# Patient Record
Sex: Male | Born: 1970
Health system: Southern US, Community
[De-identification: ages and names within clinical notes are randomized; demographics above are authoritative.]

## PROBLEM LIST (undated history)

## (undated) DIAGNOSIS — C787 Secondary malignant neoplasm of liver and intrahepatic bile duct: Secondary | ICD-10-CM

## (undated) DIAGNOSIS — Z9289 Personal history of other medical treatment: Secondary | ICD-10-CM

## (undated) DIAGNOSIS — F329 Major depressive disorder, single episode, unspecified: Secondary | ICD-10-CM

## (undated) DIAGNOSIS — K219 Gastro-esophageal reflux disease without esophagitis: Secondary | ICD-10-CM

## (undated) DIAGNOSIS — S3992XA Unspecified injury of lower back, initial encounter: Secondary | ICD-10-CM

## (undated) DIAGNOSIS — Z923 Personal history of irradiation: Secondary | ICD-10-CM

## (undated) DIAGNOSIS — B029 Zoster without complications: Secondary | ICD-10-CM

## (undated) DIAGNOSIS — J90 Pleural effusion, not elsewhere classified: Secondary | ICD-10-CM

## (undated) DIAGNOSIS — C3490 Malignant neoplasm of unspecified part of unspecified bronchus or lung: Secondary | ICD-10-CM

## (undated) DIAGNOSIS — C7951 Secondary malignant neoplasm of bone: Secondary | ICD-10-CM

## (undated) DIAGNOSIS — N189 Chronic kidney disease, unspecified: Secondary | ICD-10-CM

## (undated) DIAGNOSIS — F32A Depression, unspecified: Secondary | ICD-10-CM

## (undated) DIAGNOSIS — I214 Non-ST elevation (NSTEMI) myocardial infarction: Secondary | ICD-10-CM

## (undated) HISTORY — PX: TONSILLECTOMY: SUR1361

---
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
## 2017-06-13 ENCOUNTER — Inpatient Hospital Stay (HOSPITAL_COMMUNITY)
Admission: EM | Admit: 2017-06-13 | Discharge: 2017-06-23 | DRG: 236 | Disposition: A | Discharge status: Home / Self Care | Payer: BLUE CROSS/BLUE SHIELD | Attending: Cardiothoracic Surgery | Admitting: Cardiothoracic Surgery

## 2017-06-13 ENCOUNTER — Emergency Department (HOSPITAL_COMMUNITY): Payer: BLUE CROSS/BLUE SHIELD

## 2017-06-13 ENCOUNTER — Encounter (HOSPITAL_COMMUNITY): Payer: Self-pay | Admitting: Emergency Medicine

## 2017-06-13 DIAGNOSIS — N183 Chronic kidney disease, stage 3 unspecified: Secondary | ICD-10-CM

## 2017-06-13 DIAGNOSIS — D62 Acute posthemorrhagic anemia: Secondary | ICD-10-CM | POA: Diagnosis not present

## 2017-06-13 DIAGNOSIS — J90 Pleural effusion, not elsewhere classified: Secondary | ICD-10-CM

## 2017-06-13 DIAGNOSIS — R079 Chest pain, unspecified: Secondary | ICD-10-CM | POA: Diagnosis not present

## 2017-06-13 DIAGNOSIS — I25709 Atherosclerosis of coronary artery bypass graft(s), unspecified, with unspecified angina pectoris: Secondary | ICD-10-CM

## 2017-06-13 DIAGNOSIS — J9811 Atelectasis: Secondary | ICD-10-CM | POA: Diagnosis not present

## 2017-06-13 DIAGNOSIS — K224 Dyskinesia of esophagus: Secondary | ICD-10-CM | POA: Diagnosis not present

## 2017-06-13 DIAGNOSIS — D72829 Elevated white blood cell count, unspecified: Secondary | ICD-10-CM | POA: Diagnosis not present

## 2017-06-13 DIAGNOSIS — I129 Hypertensive chronic kidney disease with stage 1 through stage 4 chronic kidney disease, or unspecified chronic kidney disease: Secondary | ICD-10-CM | POA: Diagnosis present

## 2017-06-13 DIAGNOSIS — G8918 Other acute postprocedural pain: Secondary | ICD-10-CM

## 2017-06-13 DIAGNOSIS — F17220 Nicotine dependence, chewing tobacco, uncomplicated: Secondary | ICD-10-CM | POA: Diagnosis present

## 2017-06-13 DIAGNOSIS — R911 Solitary pulmonary nodule: Secondary | ICD-10-CM

## 2017-06-13 DIAGNOSIS — I4510 Unspecified right bundle-branch block: Secondary | ICD-10-CM | POA: Diagnosis present

## 2017-06-13 DIAGNOSIS — I2510 Atherosclerotic heart disease of native coronary artery without angina pectoris: Secondary | ICD-10-CM | POA: Diagnosis present

## 2017-06-13 DIAGNOSIS — K5900 Constipation, unspecified: Secondary | ICD-10-CM | POA: Diagnosis not present

## 2017-06-13 DIAGNOSIS — I25110 Atherosclerotic heart disease of native coronary artery with unstable angina pectoris: Secondary | ICD-10-CM | POA: Diagnosis present

## 2017-06-13 DIAGNOSIS — N179 Acute kidney failure, unspecified: Secondary | ICD-10-CM | POA: Diagnosis not present

## 2017-06-13 DIAGNOSIS — I214 Non-ST elevation (NSTEMI) myocardial infarction: Secondary | ICD-10-CM | POA: Diagnosis not present

## 2017-06-13 DIAGNOSIS — F17290 Nicotine dependence, other tobacco product, uncomplicated: Secondary | ICD-10-CM | POA: Diagnosis present

## 2017-06-13 DIAGNOSIS — K219 Gastro-esophageal reflux disease without esophagitis: Secondary | ICD-10-CM | POA: Diagnosis present

## 2017-06-13 DIAGNOSIS — Z951 Presence of aortocoronary bypass graft: Secondary | ICD-10-CM

## 2017-06-13 DIAGNOSIS — E785 Hyperlipidemia, unspecified: Secondary | ICD-10-CM | POA: Diagnosis present

## 2017-06-13 HISTORY — DX: Unspecified injury of lower back, initial encounter: S39.92XA

## 2017-06-13 HISTORY — DX: Non-ST elevation (NSTEMI) myocardial infarction: I21.4

## 2017-06-13 HISTORY — DX: Gastro-esophageal reflux disease without esophagitis: K21.9

## 2017-06-13 LAB — CBC WITH DIFFERENTIAL/PLATELET
BASOS PCT: 0 %
Basophils Absolute: 0 10*3/uL (ref 0.0–0.1)
EOS ABS: 0.1 10*3/uL (ref 0.0–0.7)
EOS PCT: 1 %
HCT: 42.4 % (ref 39.0–52.0)
Hemoglobin: 15 g/dL (ref 13.0–17.0)
Lymphocytes Relative: 16 %
Lymphs Abs: 2.3 10*3/uL (ref 0.7–4.0)
MCH: 30.8 pg (ref 26.0–34.0)
MCHC: 35.4 g/dL (ref 30.0–36.0)
MCV: 87.1 fL (ref 78.0–100.0)
MONO ABS: 0.7 10*3/uL (ref 0.1–1.0)
MONOS PCT: 5 %
Neutro Abs: 11 10*3/uL — ABNORMAL HIGH (ref 1.7–7.7)
Neutrophils Relative %: 78 %
PLATELETS: 294 10*3/uL (ref 150–400)
RBC: 4.87 MIL/uL (ref 4.22–5.81)
RDW: 13.5 % (ref 11.5–15.5)
WBC: 14.1 10*3/uL — ABNORMAL HIGH (ref 4.0–10.5)

## 2017-06-13 MED ORDER — GI COCKTAIL ~~LOC~~
30.0000 mL | Freq: Once | ORAL | Status: AC
  Administered 2017-06-13: 30 mL via ORAL
  Filled 2017-06-13: qty 30

## 2017-06-13 NOTE — ED Notes (Signed)
Patient transported to X-ray 

## 2017-06-13 NOTE — ED Provider Notes (Signed)
[facility] EMERGENCY DEPARTMENT Provider Note   CSN: [id number] Arrival date & time: 06/13/17  2235     History   Chief Complaint Chief Complaint  Patient presents with  . Chest Pain    HPI Anthony [name] is a 46 y.o. male.  Patient complains of anterior chest pain that started this evening while eating dinner barbecue chicken.  Reports the pain is in the center of his chest and radiates to both of his shoulders.  He tried to relieve this with burping but was unsuccessful.  He made himself vomit which was also unsuccessful and then was called EMS.  He received aspirin and nitroglycerin by EMS and now his pain is resolved.  Patient has had this pain multiple times previously and has attributed to indigestion.  It normally occurs after eating and is relieved with belching or walking around.  The episode today was not going awayand lasting longer than usual.  He denies any shortness of breath or nausea.  He denies any diaphoresis.  He was able to walk his dog today and did not have any exertional chest pain.  Does not have any chest pain when he tries to go upstairs.  Patient feels at baseline now.  No abdominal pain or back pain.  Patient has not seen a doctor for 20 years.  He denies any other medical history.  He takes Nexium for acid reflux.  He does chew tobacco.  No family history of early coronary disease.  He has never had a stress test.   The history is provided by the patient and the EMS personnel.  Chest Pain   Associated symptoms include nausea. Pertinent negatives include no abdominal pain, no dizziness, no fever, no numbness, no vomiting and no weakness.    Past Medical History:  Diagnosis Date  . Acid reflux     There are no active problems to display for this patient.   No past surgical history on file.     Home Medications    Prior to Admission medications   Not on File    Family History No family history on file.  Social History Social  History   Tobacco Use  . Smoking status: Never Smoker  . Smokeless tobacco: Current User    Types: Snuff  Substance Use Topics  . Alcohol use: Yes    Comment: occasionally  . Drug use: No     Allergies   Patient has no known allergies.   Review of Systems Review of Systems  Constitutional: Negative for activity change, fatigue and fever.  HENT: Negative for congestion, nosebleeds and postnasal drip.   Respiratory: Positive for chest tightness.   Cardiovascular: Positive for chest pain.  Gastrointestinal: Positive for nausea. Negative for abdominal pain and vomiting.  Genitourinary: Negative for dysuria, hematuria and testicular pain.  Musculoskeletal: Negative for arthralgias and myalgias.  Skin: Negative for rash.  Neurological: Negative for dizziness, weakness and numbness.   all other systems are negative except as noted in the HPI and PMH.     Physical Exam Updated Vital Signs BP 118/85 (BP Location: Right Arm)   Pulse 92   Temp 99.1 F (37.3 C) (Oral)   Resp 18   Ht _0  (1.778 m)   Wt 99.8 kg (220 lb)   SpO2 100%   BMI 31.57 kg/m   Physical Exam  Constitutional: He is oriented to person, place, and time. He appears well-developed and well-nourished. No distress.  HENT:  Head: Normocephalic and  atraumatic.  Mouth/Throat: Oropharynx is clear and moist. No oropharyngeal exudate.  Eyes: Conjunctivae and EOM are normal. Pupils are equal, round, and reactive to light.  Neck: Normal range of motion. Neck supple.  No meningismus.  Cardiovascular: Normal rate, regular rhythm, normal heart sounds and intact distal pulses.  No murmur heard. Pulmonary/Chest: Effort normal and breath sounds normal. No respiratory distress. He exhibits no tenderness.  Abdominal: Soft. There is no tenderness. There is no rebound and no guarding.  Musculoskeletal: Normal range of motion. He exhibits no edema or tenderness.  Neurological: He is alert and oriented to person, place, and  time. No cranial nerve deficit. He exhibits normal muscle tone. Coordination normal.   5/5 strength throughout. CN 2-12 intact.Equal grip strength.   Skin: Skin is warm.  Psychiatric: He has a normal mood and affect. His behavior is normal.  Nursing note and vitals reviewed.    ED Treatments / Results  Labs (all labs ordered are listed, but only abnormal results are displayed) Labs Reviewed  CBC WITH DIFFERENTIAL/PLATELET - Abnormal; Notable for the following components:      Result Value   WBC 14.1 (*)    Neutro Abs 11.0 (*)    All other components within normal limits  COMPREHENSIVE METABOLIC PANEL - Abnormal; Notable for the following components:   Creatinine, Ser 1.26 (*)    All other components within normal limits  TROPONIN I - Abnormal; Notable for the following components:   Troponin I 0.63 (*)    All other components within normal limits  LIPASE, BLOOD  D-DIMER, QUANTITATIVE (NOT AT [facility])    EKG  EKG Interpretation  Date/Time:  Tuesday June 13 2017 22:43:08 EST Ventricular Rate:  83 PR Interval:  148 QRS Duration: 100 QT Interval:  372 QTC Calculation: 437 R Axis:   -16 Text Interpretation:  Normal sinus rhythm with sinus arrhythmia Incomplete right bundle branch block Possible Anterolateral infarct , age undetermined Abnormal ECG No significant change was found Confirmed by Ezequiel Essex [phone]) on 06/13/2017 10:58:26 PM       Radiology Dg Chest 2 View  Result Date: 06/13/2017 CLINICAL DATA:  46 year old male with chest pain and shortness of breath. EXAM: CHEST  2 VIEW COMPARISON:  None. FINDINGS: The heart size and mediastinal contours are within normal limits. Both lungs are clear. The visualized skeletal structures are unremarkable. IMPRESSION: No active cardiopulmonary disease. Electronically Signed   By: Anner Crete M.D.   On: 06/13/2017 23:52    Procedures Procedures (including critical care time)  Medications Ordered in ED Medications    pantoprazole (PROTONIX) EC tablet 40 mg (not administered)  loratadine (CLARITIN) tablet 10 mg (not administered)  metoprolol tartrate (LOPRESSOR) tablet 25 mg (0 mg Oral Hold 06/14/17 0552)  aspirin chewable tablet 324 mg (324 mg Oral Not Given 06/14/17 0559)    Or  aspirin suppository 300 mg ( Rectal See Alternative 06/14/17 0559)  aspirin EC tablet 81 mg (not administered)  nitroGLYCERIN (NITROSTAT) SL tablet 0.4 mg (not administered)  acetaminophen (TYLENOL) tablet 650 mg (not administered)  ondansetron (ZOFRAN) injection 4 mg (not administered)  heparin ADULT infusion 100 units/mL (25000 units/276m sodium chloride 0.45%) (1,200 Units/hr Intravenous New Bag/Given 06/14/17 0102)  gi cocktail (Maalox,Lidocaine,Donnatal) (30 mLs Oral Given 06/13/17 2318)  aspirin chewable tablet 324 mg (324 mg Oral Given by EMS 06/14/17 0057)  nitroGLYCERIN 50 mg in dextrose 5 % 250 mL (0.2 mg/mL) infusion (15 mcg/min Intravenous Rate/Dose Change 06/14/17 0206)  aspirin chewable tablet 324 mg (  324 mg Oral Given 06/14/17 0558)  heparin bolus via infusion 4,000 Units (4,000 Units Intravenous Bolus from Bag 06/14/17 0102)     Initial Impression / Assessment and Plan / ED Course  I have reviewed the triage vital signs and the nursing notes.  Pertinent labs & imaging results that were available during my care of the patient were reviewed by me and considered in my medical decision making (see chart for details).    Episode of central chest pain similar to previous episodes of indigestion unrelieved by belching and vomiting.  Now pain-free after nitroglycerin and aspirin.  EKG is normal sinus rhythm without comparison.  Patient given aspirin and GI cocktail.  Troponin is positive at 0.63.  Patient continues to complain of chest tightness.  Will start IV heparin and IV nitroglycerin.  D-dimer negative.  Doubt pulmonary embolism.  ESR and CRP obtained to evaluate for possible myocarditis. Doubt aortic  dissection.  Admission discussed with cardiology fellow Dr. Therisa Doyne.  CRITICAL CARE Performed by: Ezequiel Essex Total critical care time: 35 minutes Critical care time was exclusive of separately billable procedures and treating other patients. Critical care was necessary to treat or prevent imminent or life-threatening deterioration. Critical care was time spent personally by me on the following activities: development of treatment plan with patient and/or surrogate as well as nursing, discussions with consultants, evaluation of patient's response to treatment, examination of patient, obtaining history from patient or surrogate, ordering and performing treatments and interventions, ordering and review of laboratory studies, ordering and review of radiographic studies, pulse oximetry and re-evaluation of patient's condition.  Final Clinical Impressions(s) / ED Diagnoses   Final diagnoses:  NSTEMI (non-ST elevated myocardial infarction) [facility])    ED Discharge Orders    None       Ezequiel Essex, MD 06/14/17 [phone]

## 2017-06-13 NOTE — ED Triage Notes (Signed)
Per [facility], Pt from home. Pt complaining of CP starting 2 p.m. Today. Pt reports he tried to burp, walk and make himself vomit to feel better with no relief. Pt's initial BP with EMS 198/110. Pt received 324 ASA and 3 nitro en route. Pt's initial pain score 6, after medication is 0.5. Pt denies diaphoresis, nausea/vomiting. Pt alert and oriented, in NAD.

## 2017-06-14 ENCOUNTER — Encounter (HOSPITAL_COMMUNITY): Payer: Self-pay | Admitting: General Practice

## 2017-06-14 ENCOUNTER — Inpatient Hospital Stay (HOSPITAL_COMMUNITY): Payer: BLUE CROSS/BLUE SHIELD

## 2017-06-14 ENCOUNTER — Other Ambulatory Visit: Payer: Self-pay

## 2017-06-14 ENCOUNTER — Encounter (HOSPITAL_COMMUNITY)
Admission: EM | Disposition: A | Discharge status: Home / Self Care | Payer: Self-pay | Source: Home / Self Care | Attending: Interventional Cardiology

## 2017-06-14 DIAGNOSIS — N184 Chronic kidney disease, stage 4 (severe): Secondary | ICD-10-CM | POA: Diagnosis not present

## 2017-06-14 DIAGNOSIS — N183 Chronic kidney disease, stage 3 (moderate): Secondary | ICD-10-CM | POA: Diagnosis present

## 2017-06-14 DIAGNOSIS — R079 Chest pain, unspecified: Secondary | ICD-10-CM | POA: Diagnosis present

## 2017-06-14 DIAGNOSIS — K224 Dyskinesia of esophagus: Secondary | ICD-10-CM | POA: Diagnosis not present

## 2017-06-14 DIAGNOSIS — J9811 Atelectasis: Secondary | ICD-10-CM | POA: Diagnosis not present

## 2017-06-14 DIAGNOSIS — I214 Non-ST elevation (NSTEMI) myocardial infarction: Secondary | ICD-10-CM | POA: Diagnosis present

## 2017-06-14 DIAGNOSIS — E785 Hyperlipidemia, unspecified: Secondary | ICD-10-CM | POA: Diagnosis present

## 2017-06-14 DIAGNOSIS — K5900 Constipation, unspecified: Secondary | ICD-10-CM | POA: Diagnosis not present

## 2017-06-14 DIAGNOSIS — N179 Acute kidney failure, unspecified: Secondary | ICD-10-CM | POA: Diagnosis not present

## 2017-06-14 DIAGNOSIS — I25110 Atherosclerotic heart disease of native coronary artery with unstable angina pectoris: Secondary | ICD-10-CM | POA: Diagnosis present

## 2017-06-14 DIAGNOSIS — D62 Acute posthemorrhagic anemia: Secondary | ICD-10-CM | POA: Diagnosis not present

## 2017-06-14 DIAGNOSIS — Z01810 Encounter for preprocedural cardiovascular examination: Secondary | ICD-10-CM | POA: Diagnosis not present

## 2017-06-14 DIAGNOSIS — F17290 Nicotine dependence, other tobacco product, uncomplicated: Secondary | ICD-10-CM | POA: Diagnosis present

## 2017-06-14 DIAGNOSIS — R0789 Other chest pain: Secondary | ICD-10-CM | POA: Diagnosis not present

## 2017-06-14 DIAGNOSIS — K219 Gastro-esophageal reflux disease without esophagitis: Secondary | ICD-10-CM | POA: Diagnosis present

## 2017-06-14 DIAGNOSIS — F17220 Nicotine dependence, chewing tobacco, uncomplicated: Secondary | ICD-10-CM | POA: Diagnosis present

## 2017-06-14 DIAGNOSIS — I2510 Atherosclerotic heart disease of native coronary artery without angina pectoris: Secondary | ICD-10-CM | POA: Diagnosis not present

## 2017-06-14 DIAGNOSIS — I4510 Unspecified right bundle-branch block: Secondary | ICD-10-CM | POA: Diagnosis present

## 2017-06-14 DIAGNOSIS — I129 Hypertensive chronic kidney disease with stage 1 through stage 4 chronic kidney disease, or unspecified chronic kidney disease: Secondary | ICD-10-CM | POA: Diagnosis present

## 2017-06-14 DIAGNOSIS — D72829 Elevated white blood cell count, unspecified: Secondary | ICD-10-CM | POA: Diagnosis not present

## 2017-06-14 DIAGNOSIS — I361 Nonrheumatic tricuspid (valve) insufficiency: Secondary | ICD-10-CM | POA: Diagnosis not present

## 2017-06-14 HISTORY — PX: LEFT HEART CATH AND CORONARY ANGIOGRAPHY: CATH118249

## 2017-06-14 HISTORY — PX: CARDIAC CATHETERIZATION: SHX172

## 2017-06-14 LAB — COMPREHENSIVE METABOLIC PANEL
ALT: 33 U/L (ref 17–63)
ANION GAP: 8 (ref 5–15)
AST: 23 U/L (ref 15–41)
Albumin: 3.9 g/dL (ref 3.5–5.0)
Alkaline Phosphatase: 46 U/L (ref 38–126)
BUN: 11 mg/dL (ref 6–20)
CALCIUM: 9.2 mg/dL (ref 8.9–10.3)
CHLORIDE: 103 mmol/L (ref 101–111)
CO2: 27 mmol/L (ref 22–32)
CREATININE: 1.26 mg/dL — AB (ref 0.61–1.24)
Glucose, Bld: 91 mg/dL (ref 65–99)
POTASSIUM: 3.8 mmol/L (ref 3.5–5.1)
SODIUM: 138 mmol/L (ref 135–145)
TOTAL PROTEIN: 6.8 g/dL (ref 6.5–8.1)
Total Bilirubin: 0.6 mg/dL (ref 0.3–1.2)

## 2017-06-14 LAB — SEDIMENTATION RATE: Sed Rate: 13 mm/hr (ref 0–16)

## 2017-06-14 LAB — C-REACTIVE PROTEIN: CRP: 1 mg/dL — AB (ref <1.0)

## 2017-06-14 LAB — TROPONIN I
TROPONIN I: 1.59 ng/mL — AB (ref <0.03)
Troponin I: 0.63 ng/mL (ref <0.03)

## 2017-06-14 LAB — D-DIMER, QUANTITATIVE: D-Dimer, Quant: <0.27 ug/mL-FEU (ref 0.00–0.50)

## 2017-06-14 LAB — LIPID PANEL
CHOL/HDL RATIO: 7.2 ratio
CHOLESTEROL: 187 mg/dL (ref 0–200)
HDL: 26 mg/dL — AB (ref >40)
LDL Cholesterol: 129 mg/dL — ABNORMAL HIGH (ref 0–99)
TRIGLYCERIDES: 160 mg/dL — AB (ref <150)
VLDL: 32 mg/dL (ref 0–40)

## 2017-06-14 LAB — HEPARIN LEVEL (UNFRACTIONATED): Heparin Unfractionated: 0.23 IU/mL — ABNORMAL LOW (ref 0.30–0.70)

## 2017-06-14 LAB — LIPASE, BLOOD: LIPASE: 26 U/L (ref 11–51)

## 2017-06-14 LAB — HIV ANTIBODY (ROUTINE TESTING W REFLEX): HIV SCREEN 4TH GENERATION: NONREACTIVE

## 2017-06-14 LAB — PROTIME-INR
INR: 1.01
Prothrombin Time: 13.2 seconds (ref 11.4–15.2)

## 2017-06-14 LAB — HEMOGLOBIN A1C
HEMOGLOBIN A1C: 5.2 % (ref 4.8–5.6)
Mean Plasma Glucose: 102.54 mg/dL

## 2017-06-14 SURGERY — LEFT HEART CATH AND CORONARY ANGIOGRAPHY
Anesthesia: LOCAL

## 2017-06-14 MED ORDER — IOPAMIDOL (ISOVUE-370) INJECTION 76%
INTRAVENOUS | Status: AC
  Filled 2017-06-14: qty 125

## 2017-06-14 MED ORDER — SODIUM CHLORIDE 0.9 % WEIGHT BASED INFUSION: 1.0000 mL/kg/h | INTRAVENOUS | Status: DC

## 2017-06-14 MED ORDER — IOPAMIDOL (ISOVUE-370) INJECTION 76%
INTRAVENOUS | Status: DC | PRN
  Administered 2017-06-14: 95 mL via INTRA_ARTERIAL

## 2017-06-14 MED ORDER — ASPIRIN 81 MG PO CHEW: 324.0000 mg | CHEWABLE_TABLET | Freq: Once | ORAL | Status: AC

## 2017-06-14 MED ORDER — ONDANSETRON HCL 4 MG/2ML IJ SOLN: 4.0000 mg | Freq: Four times a day (QID) | INTRAMUSCULAR | Status: DC | PRN

## 2017-06-14 MED ORDER — SODIUM CHLORIDE 0.9 % IV SOLN: 250.0000 mL | INTRAVENOUS | Status: DC | PRN

## 2017-06-14 MED ORDER — FENTANYL CITRATE (PF) 100 MCG/2ML IJ SOLN
INTRAMUSCULAR | Status: AC
  Filled 2017-06-14: qty 2

## 2017-06-14 MED ORDER — LIDOCAINE HCL (PF) 1 % IJ SOLN
INTRAMUSCULAR | Status: AC
  Filled 2017-06-14: qty 30

## 2017-06-14 MED ORDER — PANTOPRAZOLE SODIUM 40 MG PO TBEC
40.0000 mg | DELAYED_RELEASE_TABLET | Freq: Every day | ORAL | Status: DC
  Administered 2017-06-14 – 2017-06-18 (×5): 40 mg via ORAL
  Filled 2017-06-14 (×6): qty 1

## 2017-06-14 MED ORDER — MIDAZOLAM HCL 2 MG/2ML IJ SOLN
INTRAMUSCULAR | Status: AC
  Filled 2017-06-14: qty 2

## 2017-06-14 MED ORDER — FENTANYL CITRATE (PF) 100 MCG/2ML IJ SOLN
INTRAMUSCULAR | Status: DC | PRN
  Administered 2017-06-14: 25 ug via INTRAVENOUS

## 2017-06-14 MED ORDER — HEPARIN (PORCINE) IN NACL 100-0.45 UNIT/ML-% IJ SOLN
1400.0000 [IU]/h | INTRAMUSCULAR | Status: DC
  Administered 2017-06-14: 1200 [IU]/h via INTRAVENOUS
  Filled 2017-06-14: qty 250

## 2017-06-14 MED ORDER — DIAZEPAM 5 MG PO TABS: 5.0000 mg | ORAL_TABLET | Freq: Four times a day (QID) | ORAL | Status: DC | PRN

## 2017-06-14 MED ORDER — NITROGLYCERIN IN D5W 200-5 MCG/ML-% IV SOLN
0.0000 ug/min | INTRAVENOUS | Status: DC
  Administered 2017-06-16: 15 ug/min via INTRAVENOUS
  Filled 2017-06-14 (×3): qty 250

## 2017-06-14 MED ORDER — NITROGLYCERIN 0.4 MG SL SUBL: 0.4000 mg | SUBLINGUAL_TABLET | SUBLINGUAL | Status: DC | PRN

## 2017-06-14 MED ORDER — ASPIRIN 81 MG PO CHEW: 324.0000 mg | CHEWABLE_TABLET | ORAL | Status: DC

## 2017-06-14 MED ORDER — SODIUM CHLORIDE 0.9 % IV SOLN
250.0000 mL | INTRAVENOUS | Status: DC | PRN
  Administered 2017-06-15 – 2017-06-17 (×3): 250 mL via INTRAVENOUS
  Administered 2017-06-19 (×3): via INTRAVENOUS

## 2017-06-14 MED ORDER — ATORVASTATIN CALCIUM 80 MG PO TABS
80.0000 mg | ORAL_TABLET | Freq: Every day | ORAL | Status: DC
  Administered 2017-06-14 – 2017-06-22 (×9): 80 mg via ORAL
  Filled 2017-06-14 (×8): qty 1

## 2017-06-14 MED ORDER — ASPIRIN EC 81 MG PO TBEC
81.0000 mg | DELAYED_RELEASE_TABLET | Freq: Every day | ORAL | Status: DC
  Administered 2017-06-15 – 2017-06-18 (×4): 81 mg via ORAL
  Filled 2017-06-14 (×4): qty 1

## 2017-06-14 MED ORDER — HEPARIN (PORCINE) IN NACL 2-0.9 UNIT/ML-% IJ SOLN
INTRAMUSCULAR | Status: AC
  Filled 2017-06-14: qty 1000

## 2017-06-14 MED ORDER — SODIUM CHLORIDE 0.9 % WEIGHT BASED INFUSION: 3.0000 mL/kg/h | INTRAVENOUS | Status: DC

## 2017-06-14 MED ORDER — METOPROLOL TARTRATE 25 MG PO TABS: 25.0000 mg | ORAL_TABLET | Freq: Once | ORAL | Status: DC

## 2017-06-14 MED ORDER — ACETAMINOPHEN 325 MG PO TABS
650.0000 mg | ORAL_TABLET | ORAL | Status: DC | PRN
  Administered 2017-06-14 – 2017-06-18 (×10): 650 mg via ORAL
  Filled 2017-06-14 (×10): qty 2

## 2017-06-14 MED ORDER — OXYCODONE HCL 5 MG PO TABS: 5.0000 mg | ORAL_TABLET | ORAL | Status: DC | PRN

## 2017-06-14 MED ORDER — HEPARIN (PORCINE) IN NACL 2-0.9 UNIT/ML-% IJ SOLN
INTRAMUSCULAR | Status: AC | PRN
  Administered 2017-06-14: 1000 mL

## 2017-06-14 MED ORDER — MIDAZOLAM HCL 2 MG/2ML IJ SOLN
INTRAMUSCULAR | Status: DC | PRN
  Administered 2017-06-14: 2 mg via INTRAVENOUS

## 2017-06-14 MED ORDER — NITROGLYCERIN IN D5W 200-5 MCG/ML-% IV SOLN
0.0000 ug/min | Freq: Once | INTRAVENOUS | Status: AC
  Administered 2017-06-14: 15 ug/min via INTRAVENOUS

## 2017-06-14 MED ORDER — NITROGLYCERIN IN D5W 200-5 MCG/ML-% IV SOLN
0.0000 ug/min | Freq: Once | INTRAVENOUS | Status: AC
  Administered 2017-06-14: 5 ug/min via INTRAVENOUS
  Filled 2017-06-14: qty 250

## 2017-06-14 MED ORDER — SODIUM CHLORIDE 0.9% FLUSH: 3.0000 mL | INTRAVENOUS | Status: DC | PRN

## 2017-06-14 MED ORDER — HEPARIN BOLUS VIA INFUSION
4000.0000 [IU] | Freq: Once | INTRAVENOUS | Status: AC
  Administered 2017-06-14: 4000 [IU] via INTRAVENOUS
  Filled 2017-06-14: qty 4000

## 2017-06-14 MED ORDER — VERAPAMIL HCL 2.5 MG/ML IV SOLN
INTRAVENOUS | Status: AC
  Filled 2017-06-14: qty 2

## 2017-06-14 MED ORDER — LORATADINE 10 MG PO TABS: 10.0000 mg | ORAL_TABLET | Freq: Every day | ORAL | Status: DC | PRN

## 2017-06-14 MED ORDER — ASPIRIN 81 MG PO CHEW
81.0000 mg | CHEWABLE_TABLET | ORAL | Status: AC
  Administered 2017-06-14: 81 mg via ORAL
  Filled 2017-06-14: qty 1

## 2017-06-14 MED ORDER — MORPHINE SULFATE (PF) 4 MG/ML IV SOLN: 2.0000 mg | INTRAVENOUS | Status: DC | PRN

## 2017-06-14 MED ORDER — SODIUM CHLORIDE 0.9% FLUSH: 3.0000 mL | Freq: Two times a day (BID) | INTRAVENOUS | Status: DC

## 2017-06-14 MED ORDER — ASPIRIN 81 MG PO CHEW
324.0000 mg | CHEWABLE_TABLET | Freq: Once | ORAL | Status: AC
  Administered 2017-06-14: 324 mg via ORAL
  Filled 2017-06-14: qty 4

## 2017-06-14 MED ORDER — VERAPAMIL HCL 2.5 MG/ML IV SOLN
INTRAVENOUS | Status: DC | PRN
  Administered 2017-06-14: 10 mL via INTRA_ARTERIAL

## 2017-06-14 MED ORDER — SODIUM CHLORIDE 0.9 % WEIGHT BASED INFUSION: 1.0000 mL/kg/h | INTRAVENOUS | Status: AC

## 2017-06-14 MED ORDER — HEPARIN (PORCINE) IN NACL 100-0.45 UNIT/ML-% IJ SOLN
1450.0000 [IU]/h | INTRAMUSCULAR | Status: DC
  Administered 2017-06-14: 1400 [IU]/h via INTRAVENOUS
  Administered 2017-06-15 – 2017-06-16 (×3): 1550 [IU]/h via INTRAVENOUS
  Administered 2017-06-18 – 2017-06-19 (×2): 1450 [IU]/h via INTRAVENOUS
  Filled 2017-06-14 (×7): qty 250

## 2017-06-14 MED ORDER — LIDOCAINE HCL (PF) 1 % IJ SOLN
INTRAMUSCULAR | Status: DC | PRN
  Administered 2017-06-14: 3 mL via INTRADERMAL

## 2017-06-14 MED ORDER — HEPARIN (PORCINE) IN NACL 100-0.45 UNIT/ML-% IJ SOLN: 1400.0000 [IU]/h | INTRAMUSCULAR | Status: DC

## 2017-06-14 MED ORDER — SODIUM CHLORIDE 0.9% FLUSH
3.0000 mL | Freq: Two times a day (BID) | INTRAVENOUS | Status: DC
  Administered 2017-06-15 – 2017-06-18 (×8): 3 mL via INTRAVENOUS

## 2017-06-14 MED ORDER — HEPARIN SODIUM (PORCINE) 1000 UNIT/ML IJ SOLN
INTRAMUSCULAR | Status: AC
  Filled 2017-06-14: qty 1

## 2017-06-14 MED ORDER — HEPARIN SODIUM (PORCINE) 1000 UNIT/ML IJ SOLN
INTRAMUSCULAR | Status: DC | PRN
  Administered 2017-06-14: 5000 [IU] via INTRAVENOUS

## 2017-06-14 MED ORDER — ASPIRIN 300 MG RE SUPP: 300.0000 mg | RECTAL | Status: DC

## 2017-06-14 SURGICAL SUPPLY — 12 items
CATH INFINITI 5FR ANG PIGTAIL (CATHETERS) ×2 IMPLANT
CATH INFINITI JR4 5F (CATHETERS) ×2 IMPLANT
CATH LAUNCHER 6FR EBU3.5 (CATHETERS) ×2 IMPLANT
DEVICE RAD COMP TR BAND LRG (VASCULAR PRODUCTS) ×2 IMPLANT
GLIDESHEATH SLEND SS 6F .021 (SHEATH) ×2 IMPLANT
GUIDEWIRE INQWIRE 1.5J.035X260 (WIRE) ×1 IMPLANT
INQWIRE 1.5J .035X260CM (WIRE) ×2
KIT HEART LEFT (KITS) ×2 IMPLANT
PACK CARDIAC CATHETERIZATION (CUSTOM PROCEDURE TRAY) ×2 IMPLANT
SYR MEDRAD MARK V 150ML (SYRINGE) ×2 IMPLANT
TRANSDUCER W/STOPCOCK (MISCELLANEOUS) ×2 IMPLANT
TUBING CIL FLEX 10 FLL-RA (TUBING) ×2 IMPLANT

## 2017-06-14 NOTE — Progress Notes (Signed)
ANTICOAGULATION CONSULT NOTE  Pharmacy Consult for Heparin Indication: chest pain/ACS  No Known Allergies  Patient Measurements: Height: 5\' 10"  (177.8 cm) Weight: 220 lb (99.8 kg) IBW/kg (Calculated) : 73 Heparin Dosing Weight: 94 kg  Vital Signs: Temp: 99.1 F (37.3 C) (11/13 2236) Temp Source: Oral (11/13 2236) BP: 132/87 (11/14 0700) Pulse Rate: 66 (11/14 0700)  Labs: Recent Labs    06/13/17 2313 06/14/17 0754  HGB 15.0  --   HCT 42.4  --   PLT 294  --   HEPARINUNFRC  --  0.23*  CREATININE 1.26*  --   TROPONINI 0.63*  --     Estimated Creatinine Clearance: 86.7 mL/min (A) (by C-G formula based on SCr of 1.26 mg/dL (H)).   Medical History: Past Medical History:  Diagnosis Date  . Acid reflux     Medications:  No current facility-administered medications on file prior to encounter.    Current Outpatient Medications on File Prior to Encounter  Medication Sig Dispense Refill  . esomeprazole (NEXIUM) 20 MG capsule Take 20 mg daily at 12 noon by mouth.    . loratadine (CLARITIN) 10 MG tablet Take 10 mg daily as needed by mouth for allergies.       Assessment: 46 y.o. male with chest pain continuing on heparin for ACS. Initial heparin level low at 0.23. CBC wnl. No bleed or IV line issues per RN. Not on anticoagulation PTA. D-dimer negative, trop positive.  Goal of Therapy:  Heparin level 0.3-0.7 units/ml Monitor platelets by anticoagulation protocol: Yes   Plan:  Increase heparin to 1400 units/hr Check heparin level in 6 hours Daily heparin level/CBC Monitor for s/sx bleeding F/u Cardiology plans  Elicia Lamp, PharmD, BCPS Clinical Pharmacist 06/14/2017 8:46 AM

## 2017-06-14 NOTE — Interval H&P Note (Signed)
Cath Lab Visit (complete for each Cath Lab visit)  Clinical Evaluation Leading to the Procedure:   ACS: Yes.    Non-ACS:    Anginal Classification: CCS IV  Anti-ischemic medical therapy: No Therapy  Non-Invasive Test Results: No non-invasive testing performed  Prior CABG: No previous CABG  History and Physical Interval Note:  06/14/2017 4:11 PM  Anthony [name]  has presented today for surgery, with the diagnosis of cp  The various methods of treatment have been discussed with the patient and family. After consideration of risks, benefits and other options for treatment, the patient has consented to  Procedure(s): LEFT HEART CATH AND CORONARY ANGIOGRAPHY (N/A) as a surgical intervention .  The patient's history has been reviewed, patient examined, no change in status, stable for surgery.  I have reviewed the patient's chart and labs.  Questions were answered to the patient's satisfaction.     Sherren Mocha

## 2017-06-14 NOTE — Progress Notes (Signed)
The patient has been seen in conjunction with Reino Bellis, NP-C. All aspects of care have been considered and discussed. The patient has been personally interviewed, examined, and all clinical data has been reviewed.   Has ACS and needs cath. The patient was counseled to undergo left heart catheterization, coronary angiography, and possible percutaneous coronary intervention with stent implantation. The procedural risks and benefits were discussed in detail. The risks discussed included death, stroke, myocardial infarction, life-threatening bleeding, limb ischemia, kidney injury, allergy, and possible emergency cardiac surgery. The risk of these significant complications were estimated to occur less than 1% of the time. After discussion, the patient has agreed to proceed.  Progress Note  Patient Name: Anthony [name] Date of Encounter: 06/14/2017  Primary Cardiologist: New  Subjective   No current chest pain.   Inpatient Medications    Scheduled Meds: . [START ON 06/15/2017] aspirin EC  81 mg Oral Daily  . metoprolol tartrate  25 mg Oral Once  . pantoprazole  40 mg Oral Daily   Continuous Infusions: . heparin 1,400 Units/hr (06/14/17 0859)   PRN Meds: acetaminophen, loratadine, nitroGLYCERIN, ondansetron (ZOFRAN) IV   Vital Signs    Vitals:   06/14/17 0615 06/14/17 0630 06/14/17 0645 06/14/17 0700  BP: (!) 147/87 137/77 135/85 132/87  Pulse: 64 69 60 66  Resp: 12 11 11 11   Temp:      TempSrc:      SpO2: 97% 95% 97% 98%  Weight:      Height:       No intake or output data in the 24 hours ending 06/14/17 1024 Filed Weights   06/13/17 2236  Weight: 220 lb (99.8 kg)    Telemetry    SR - Personally Reviewed  ECG    SR - Personally Reviewed  Physical Exam   General: Well developed, well nourished, male appearing in no acute distress. Head: Normocephalic, atraumatic.  Neck: Supple without bruits, JVD. Lungs:  Resp regular and unlabored, CTA. Heart: RRR, S1,  S2, no S3, S4, or murmur; no rub. Abdomen: Soft, non-tender, non-distended with normoactive bowel sounds. No hepatomegaly. No rebound/guarding. No obvious abdominal masses. Extremities: No clubbing, cyanosis, edema. Distal pedal pulses are 2+ bilaterally. Neuro: Alert and oriented X 3. Moves all extremities spontaneously. Psych: Normal affect.  Labs    Chemistry Recent Labs  Lab 06/13/17 2313  NA 138  K 3.8  CL 103  CO2 27  GLUCOSE 91  BUN 11  CREATININE 1.26*  CALCIUM 9.2  PROT 6.8  ALBUMIN 3.9  AST 23  ALT 33  ALKPHOS 46  BILITOT 0.6  GFRNONAA >60  GFRAA >60  ANIONGAP 8     Hematology Recent Labs  Lab 06/13/17 2313  WBC 14.1*  RBC 4.87  HGB 15.0  HCT 42.4  MCV 87.1  MCH 30.8  MCHC 35.4  RDW 13.5  PLT 294    Cardiac Enzymes Recent Labs  Lab 06/13/17 2313 06/14/17 0754  TROPONINI 0.63* 1.59*   No results for input(s): TROPIPOC in the last 168 hours.   BNPNo results for input(s): BNP, PROBNP in the last 168 hours.   DDimer  Recent Labs  Lab 06/14/17 0037  DDIMER <0.27      Radiology    Dg Chest 2 View  Result Date: 06/13/2017 CLINICAL DATA:  46 year old male with chest pain and shortness of breath. EXAM: CHEST  2 VIEW COMPARISON:  None. FINDINGS: The heart size and mediastinal contours are within normal limits. Both lungs are  clear. The visualized skeletal structures are unremarkable. IMPRESSION: No active cardiopulmonary disease. Electronically Signed   By: Anner Crete M.D.   On: 06/13/2017 23:52    Cardiac Studies   N/a   Patient Profile     46 y.o. male with no PMH who presented with chest pressure over the past week. Found to have positive troponin.   Assessment & Plan    1. NSTEMI: Trop trending up 0.6>>1.59. He reports symptoms first developed about a week ago after eating spicy food. Lingered for 2 days, then improved. Also reports intermittent episodes of chest tightness while walking the dog that improve with rest.  Yesterday ate lunch around 2pm and developed chest pressure with mild burning, that persisted into the evening. Vomited that evening, and then chest pressure significantly worsened.  -- now pain free on nitro gtt, and heparin gtt. Clinical picture is concerning for ACS. -- plan for cardiac cath today -- The patient understands that risks included but are not limited to stroke (1 in 1000), death (1 in 1000), kidney failure [usually temporary] (1 in 500), bleeding (1 in 200), allergic reaction [possibly serious] (1 in 200).  -- check lipid panel and Hgb A1c  2. AKI?: Cr 1.26, will give prehydration IVFs now.  -- BMET in the am  Signed, Reino Bellis, NP  06/14/2017, 10:24 AM  Pager # [phone]   For questions or updates, please contact [facility] Please consult [email or web address] for contact info under Cardiology/STEMI.

## 2017-06-14 NOTE — H&P (Signed)
Anthony [name] is an 46 y.o. male.      DOB 1970-09-10  Chief Complaint: Chest  Pain , No  prior  Cardiology  Issues HPI:  56  Y  OLD  WITH Chest pain ,   Patient complains of anterior chest pain that started this evening while eating dinner barbecue chicken.  Reports the pain is in the center of his chest and radiates to both of his shoulders.  He tried to relieve this with burping but was unsuccessful.  He made himself vomit which was also unsuccessful and then was called EMS.  He received aspirin and nitroglycerin by EMS and now his pain is resolved.  Patient has had this pain multiple times previously and has attributed to indigestion.  It normally occurs after eating and is relieved with belching or walking around.  The episode today was not going awayand lasting longer than usual.  He denies any shortness of breath or nausea.  He denies any diaphoresis.  He was able to walk his dog today and did not have any exertional chest pain.  Does not have any chest pain when he tries to go upstairs.  Patient feels at baseline now.  No abdominal pain or back pain.  Patient has not seen a doctor for 20 years.  He denies any other medical history.  He takes Nexium for acid reflux.  He does chew tobacco.  No family history of early coronary disease.  He has never had a stress test.   GERD  Type, trop - positive 0.5 D/d  Myopericarditis Hep gtt , nitro gtt on board Trop  Being  Trended.  Past Medical History:  Diagnosis Date  . Acid reflux     No past surgical history on file.  No family history on file. Social History:  reports that  has never smoked. His smokeless tobacco use includes snuff. He reports that he drinks alcohol. He reports that he does not use drugs.  Allergies: No Known Allergies   (Not in a hospital admission)  Results for orders placed or performed during the hospital encounter of 06/13/17 (from the past 48 hour(s))  CBC with Differential/Platelet     Status: Abnormal   Collection Time: 06/13/17 11:13 PM  Result Value Ref Range   WBC 14.1 (H) 4.0 - 10.5 K/uL   RBC 4.87 4.22 - 5.81 MIL/uL   Hemoglobin 15.0 13.0 - 17.0 g/dL   HCT 42.4 39.0 - 52.0 %   MCV 87.1 78.0 - 100.0 fL   MCH 30.8 26.0 - 34.0 pg   MCHC 35.4 30.0 - 36.0 g/dL   RDW 13.5 11.5 - 15.5 %   Platelets 294 150 - 400 K/uL   Neutrophils Relative % 78 %   Neutro Abs 11.0 (H) 1.7 - 7.7 K/uL   Lymphocytes Relative 16 %   Lymphs Abs 2.3 0.7 - 4.0 K/uL   Monocytes Relative 5 %   Monocytes Absolute 0.7 0.1 - 1.0 K/uL   Eosinophils Relative 1 %   Eosinophils Absolute 0.1 0.0 - 0.7 K/uL   Basophils Relative 0 %   Basophils Absolute 0.0 0.0 - 0.1 K/uL  Comprehensive metabolic panel     Status: Abnormal   Collection Time: 06/13/17 11:13 PM  Result Value Ref Range   Sodium 138 135 - 145 mmol/L   Potassium 3.8 3.5 - 5.1 mmol/L   Chloride 103 101 - 111 mmol/L   CO2 27 22 - 32 mmol/L   Glucose, Bld 91 65 - 99  mg/dL   BUN 11 6 - 20 mg/dL   Creatinine, Ser 1.26 (H) 0.61 - 1.24 mg/dL   Calcium 9.2 8.9 - 10.3 mg/dL   Total Protein 6.8 6.5 - 8.1 g/dL   Albumin 3.9 3.5 - 5.0 g/dL   AST 23 15 - 41 U/L   ALT 33 17 - 63 U/L   Alkaline Phosphatase 46 38 - 126 U/L   Total Bilirubin 0.6 0.3 - 1.2 mg/dL   GFR calc non Af Amer >60 >60 mL/min   GFR calc Af Amer >60 >60 mL/min    Comment: (NOTE) The eGFR has been calculated using the CKD EPI equation. This calculation has not been validated in all clinical situations. eGFR's persistently <60 mL/min signify possible Chronic Kidney Disease.    Anion gap 8 5 - 15  Lipase, blood     Status: None   Collection Time: 06/13/17 11:13 PM  Result Value Ref Range   Lipase 26 11 - 51 U/L  Troponin I     Status: Abnormal   Collection Time: 06/13/17 11:13 PM  Result Value Ref Range   Troponin I 0.63 (HH) <0.03 ng/mL    Comment: CRITICAL RESULT CALLED TO, READ BACK BY AND VERIFIED WITH: WOODRUFF C,RN 06/14/17 0019 WAYK    Dg Chest 2 View  Result Date:  06/13/2017 CLINICAL DATA:  46 year old male with chest pain and shortness of breath. EXAM: CHEST  2 VIEW COMPARISON:  None. FINDINGS: The heart size and mediastinal contours are within normal limits. Both lungs are clear. The visualized skeletal structures are unremarkable. IMPRESSION: No active cardiopulmonary disease. Electronically Signed   By: Anner Crete M.D.   On: 06/13/2017 23:52    Review of Systems  Constitutional: Negative.   HENT: Negative.   Eyes: Negative.   Respiratory: Negative.   Cardiovascular: Positive for chest pain.  Gastrointestinal: Negative.   Musculoskeletal: Negative.   Neurological: Negative.   Psychiatric/Behavioral: Negative.     Blood pressure (!) 146/95, pulse 83, temperature 99.1 F (37.3 C), temperature source Oral, resp. rate 13, height '5\' 10"'$  (1.778 m), weight 99.8 kg (220 lb), SpO2 96 %. Physical Exam  Constitutional: He appears well-nourished.  Eyes: Pupils are equal, round, and reactive to light.  Cardiovascular: Normal rate.  Murmur heard. Respiratory: Effort normal.  GI: Soft.  Neurological: He is alert.     Assessment/Plan Precordial chest  Pain  GERD Elevated  Cardiac  Enzymes  D/D Myopericarditis NSTEMI  Await ESR ECHO, trop  Trend   [name], MD 06/14/2017, 12:37 AM

## 2017-06-14 NOTE — H&P (View-Only) (Signed)
The patient has been seen in conjunction with Reino Bellis, NP-C. All aspects of care have been considered and discussed. The patient has been personally interviewed, examined, and all clinical data has been reviewed.   Has ACS and needs cath. The patient was counseled to undergo left heart catheterization, coronary angiography, and possible percutaneous coronary intervention with stent implantation. The procedural risks and benefits were discussed in detail. The risks discussed included death, stroke, myocardial infarction, life-threatening bleeding, limb ischemia, kidney injury, allergy, and possible emergency cardiac surgery. The risk of these significant complications were estimated to occur less than 1% of the time. After discussion, the patient has agreed to proceed.  Progress Note  Patient Name: Anthony [name] Date of Encounter: 06/14/2017  Primary Cardiologist: New  Subjective   No current chest pain.   Inpatient Medications    Scheduled Meds: . [START ON 06/15/2017] aspirin EC  81 mg Oral Daily  . metoprolol tartrate  25 mg Oral Once  . pantoprazole  40 mg Oral Daily   Continuous Infusions: . heparin 1,400 Units/hr (06/14/17 0859)   PRN Meds: acetaminophen, loratadine, nitroGLYCERIN, ondansetron (ZOFRAN) IV   Vital Signs    Vitals:   06/14/17 0615 06/14/17 0630 06/14/17 0645 06/14/17 0700  BP: (!) 147/87 137/77 135/85 132/87  Pulse: 64 69 60 66  Resp: 12 11 11 11   Temp:      TempSrc:      SpO2: 97% 95% 97% 98%  Weight:      Height:       No intake or output data in the 24 hours ending 06/14/17 1024 Filed Weights   06/13/17 2236  Weight: 220 lb (99.8 kg)    Telemetry    SR - Personally Reviewed  ECG    SR - Personally Reviewed  Physical Exam   General: Well developed, well nourished, male appearing in no acute distress. Head: Normocephalic, atraumatic.  Neck: Supple without bruits, JVD. Lungs:  Resp regular and unlabored, CTA. Heart: RRR, S1,  S2, no S3, S4, or murmur; no rub. Abdomen: Soft, non-tender, non-distended with normoactive bowel sounds. No hepatomegaly. No rebound/guarding. No obvious abdominal masses. Extremities: No clubbing, cyanosis, edema. Distal pedal pulses are 2+ bilaterally. Neuro: Alert and oriented X 3. Moves all extremities spontaneously. Psych: Normal affect.  Labs    Chemistry Recent Labs  Lab 06/13/17 2313  NA 138  K 3.8  CL 103  CO2 27  GLUCOSE 91  BUN 11  CREATININE 1.26*  CALCIUM 9.2  PROT 6.8  ALBUMIN 3.9  AST 23  ALT 33  ALKPHOS 46  BILITOT 0.6  GFRNONAA >60  GFRAA >60  ANIONGAP 8     Hematology Recent Labs  Lab 06/13/17 2313  WBC 14.1*  RBC 4.87  HGB 15.0  HCT 42.4  MCV 87.1  MCH 30.8  MCHC 35.4  RDW 13.5  PLT 294    Cardiac Enzymes Recent Labs  Lab 06/13/17 2313 06/14/17 0754  TROPONINI 0.63* 1.59*   No results for input(s): TROPIPOC in the last 168 hours.   BNPNo results for input(s): BNP, PROBNP in the last 168 hours.   DDimer  Recent Labs  Lab 06/14/17 0037  DDIMER <0.27      Radiology    Dg Chest 2 View  Result Date: 06/13/2017 CLINICAL DATA:  46 year old male with chest pain and shortness of breath. EXAM: CHEST  2 VIEW COMPARISON:  None. FINDINGS: The heart size and mediastinal contours are within normal limits. Both lungs are  clear. The visualized skeletal structures are unremarkable. IMPRESSION: No active cardiopulmonary disease. Electronically Signed   By: Anner Crete M.D.   On: 06/13/2017 23:52    Cardiac Studies   N/a   Patient Profile     46 y.o. male with no PMH who presented with chest pressure over the past week. Found to have positive troponin.   Assessment & Plan    1. NSTEMI: Trop trending up 0.6>>1.59. He reports symptoms first developed about a week ago after eating spicy food. Lingered for 2 days, then improved. Also reports intermittent episodes of chest tightness while walking the dog that improve with rest.  Yesterday ate lunch around 2pm and developed chest pressure with mild burning, that persisted into the evening. Vomited that evening, and then chest pressure significantly worsened.  -- now pain free on nitro gtt, and heparin gtt. Clinical picture is concerning for ACS. -- plan for cardiac cath today -- The patient understands that risks included but are not limited to stroke (1 in 1000), death (1 in 1000), kidney failure [usually temporary] (1 in 500), bleeding (1 in 200), allergic reaction [possibly serious] (1 in 200).  -- check lipid panel and Hgb A1c  2. AKI?: Cr 1.26, will give prehydration IVFs now.  -- BMET in the am  Signed, Reino Bellis, NP  06/14/2017, 10:24 AM  Pager # [phone]   For questions or updates, please contact [facility] Please consult [email or web address] for contact info under Cardiology/STEMI.

## 2017-06-14 NOTE — ED Notes (Signed)
X-ray at bedside

## 2017-06-14 NOTE — Progress Notes (Signed)
ANTICOAGULATION CONSULT NOTE - Initial Consult  Pharmacy Consult for Heparin Indication: chest pain/ACS  No Known Allergies  Patient Measurements: Height: 5\' 10"  (177.8 cm) Weight: 220 lb (99.8 kg) IBW/kg (Calculated) : 73 Heparin Dosing Weight: 95 kg  Vital Signs: Temp: 99.1 F (37.3 C) (11/13 2236) Temp Source: Oral (11/13 2236) BP: 146/95 (11/14 0015) Pulse Rate: 83 (11/14 0015)  Labs: Recent Labs    06/13/17 2313  HGB 15.0  HCT 42.4  PLT 294  CREATININE 1.26*  TROPONINI 0.63*    Estimated Creatinine Clearance: 86.7 mL/min (A) (by C-G formula based on SCr of 1.26 mg/dL (H)).   Medical History: Past Medical History:  Diagnosis Date  . Acid reflux     Medications:  No current facility-administered medications on file prior to encounter.    Current Outpatient Medications on File Prior to Encounter  Medication Sig Dispense Refill  . esomeprazole (NEXIUM) 20 MG capsule Take 20 mg daily at 12 noon by mouth.    . loratadine (CLARITIN) 10 MG tablet Take 10 mg daily as needed by mouth for allergies.       Assessment: 46 y.o. male with chest pain for heparin  Goal of Therapy:  Heparin level 0.3-0.7 units/ml Monitor platelets by anticoagulation protocol: Yes   Plan:  Heparin 4000 units IV bolus, then start heparin 1200 units/hr Check heparin level in 6 hours.   Samella Lucchetti, Bronson Curb 06/14/2017,12:44 AM

## 2017-06-14 NOTE — Progress Notes (Signed)
ANTICOAGULATION CONSULT NOTE - Follow Up Consult  Pharmacy Consult for Heparin Indication: LAD stenosis  No Known Allergies  Patient Measurements: Height: 5\' 10"  (177.8 cm) Weight: 220 lb (99.8 kg) IBW/kg (Calculated) : 73 Heparin Dosing Weight: 94kg  Vital Signs: BP: 141/98 (11/14 1656) Pulse Rate: 78 (11/14 1656)  Labs: Recent Labs    06/13/17 2313 06/14/17 0754  HGB 15.0  --   HCT 42.4  --   PLT 294  --   LABPROT  --  13.2  INR  --  1.01  HEPARINUNFRC  --  0.23*  CREATININE 1.26*  --   TROPONINI 0.63* 1.59*    Estimated Creatinine Clearance: 86.7 mL/min (A) (by C-G formula based on SCr of 1.26 mg/dL (H)).  Assessment: 46yom s/p cath today found to have severe LAD stenosis. Heparin to resume post-cath pending surgical evaluation for CABG. Radial sheath removed at 1703 and per RN, TR band to be deflated ~ 1930.  Goal of Therapy:  Heparin level 0.3-0.7 units/ml Monitor platelets by anticoagulation protocol: Yes   Plan:  1) At 2130 tonight, resume heparin at 1400 units/hr 2) 6 hour heparin level 3) Daily heparin level and CBC  Deboraha Sprang 06/14/2017,5:38 PM

## 2017-06-15 ENCOUNTER — Encounter (HOSPITAL_COMMUNITY): Payer: Self-pay | Admitting: Cardiovascular Disease

## 2017-06-15 ENCOUNTER — Inpatient Hospital Stay (HOSPITAL_COMMUNITY): Payer: BLUE CROSS/BLUE SHIELD

## 2017-06-15 DIAGNOSIS — N183 Chronic kidney disease, stage 3 unspecified: Secondary | ICD-10-CM

## 2017-06-15 DIAGNOSIS — I361 Nonrheumatic tricuspid (valve) insufficiency: Secondary | ICD-10-CM

## 2017-06-15 DIAGNOSIS — I25110 Atherosclerotic heart disease of native coronary artery with unstable angina pectoris: Secondary | ICD-10-CM

## 2017-06-15 DIAGNOSIS — N184 Chronic kidney disease, stage 4 (severe): Secondary | ICD-10-CM

## 2017-06-15 DIAGNOSIS — E785 Hyperlipidemia, unspecified: Secondary | ICD-10-CM

## 2017-06-15 LAB — ECHOCARDIOGRAM COMPLETE
Height: 70 in
Weight: 3446.23 oz

## 2017-06-15 LAB — PULMONARY FUNCTION TEST
FEF 25-75 Post: 3.05 L/sec
FEF 25-75 Pre: 3.03 L/sec
FEF2575-%Change-Post: 0 %
FEF2575-%Pred-Post: 83 %
FEF2575-%Pred-Pre: 83 %
FEV1-%Change-Post: 3 %
FEV1-%Pred-Post: 84 %
FEV1-%Pred-Pre: 81 %
FEV1-Post: 3.42 L
FEV1-Pre: 3.29 L
FEV1FVC-%Change-Post: 8 %
FEV1FVC-%Pred-Pre: 93 %
FEV6-%Change-Post: 0 %
FEV6-%Pred-Post: 86 %
FEV6-%Pred-Pre: 86 %
FEV6-Post: 4.3 L
FEV6-Pre: 4.32 L
FEV6FVC-%Change-Post: 0 %
FEV6FVC-%Pred-Post: 103 %
FEV6FVC-%Pred-Pre: 103 %
FVC-%Change-Post: -4 %
FVC-%Pred-Post: 83 %
FVC-%Pred-Pre: 87 %
FVC-Post: 4.3 L
FVC-Pre: 4.49 L
Post FEV1/FVC ratio: 79 %
Post FEV6/FVC ratio: 100 %
Pre FEV1/FVC ratio: 73 %
Pre FEV6/FVC Ratio: 100 %

## 2017-06-15 LAB — CBC
HEMATOCRIT: 41.2 % (ref 39.0–52.0)
HEMOGLOBIN: 14.3 g/dL (ref 13.0–17.0)
MCH: 30.6 pg (ref 26.0–34.0)
MCHC: 34.7 g/dL (ref 30.0–36.0)
MCV: 88 fL (ref 78.0–100.0)
Platelets: 258 10*3/uL (ref 150–400)
RBC: 4.68 MIL/uL (ref 4.22–5.81)
RDW: 13.7 % (ref 11.5–15.5)
WBC: 8.4 10*3/uL (ref 4.0–10.5)

## 2017-06-15 LAB — SURGICAL PCR SCREEN
MRSA, PCR: NEGATIVE
STAPHYLOCOCCUS AUREUS: NEGATIVE

## 2017-06-15 LAB — HEPARIN LEVEL (UNFRACTIONATED)
HEPARIN UNFRACTIONATED: 0.39 [IU]/mL (ref 0.30–0.70)
HEPARIN UNFRACTIONATED: 0.19 [IU]/mL — AB (ref 0.30–0.70)
HEPARIN UNFRACTIONATED: 0.48 [IU]/mL (ref 0.30–0.70)

## 2017-06-15 MED ORDER — METOPROLOL SUCCINATE ER 25 MG PO TB24
25.0000 mg | ORAL_TABLET | Freq: Every day | ORAL | Status: DC
  Administered 2017-06-15 – 2017-06-16 (×3): 25 mg via ORAL
  Filled 2017-06-15 (×2): qty 1

## 2017-06-15 MED ORDER — MOVING RIGHT ALONG BOOK
Freq: Once | Status: AC
  Administered 2017-06-15: 01:00:00
  Filled 2017-06-15: qty 1

## 2017-06-15 MED ORDER — PERFLUTREN LIPID MICROSPHERE
1.0000 mL | INTRAVENOUS | Status: AC | PRN
  Administered 2017-06-15 (×2): 2 mL via INTRAVENOUS
  Filled 2017-06-15: qty 10

## 2017-06-15 MED ORDER — ALBUTEROL SULFATE (2.5 MG/3ML) 0.083% IN NEBU
2.5000 mg | INHALATION_SOLUTION | Freq: Once | RESPIRATORY_TRACT | Status: AC
  Administered 2017-06-15: 12:00:00 2.5 mg via RESPIRATORY_TRACT

## 2017-06-15 MED ORDER — ~~LOC~~ CARDIAC SURGERY, PATIENT & FAMILY EDUCATION
Freq: Once | Status: AC
  Administered 2017-06-15: 01:00:00
  Filled 2017-06-15: qty 1

## 2017-06-15 MED FILL — Lidocaine HCl Local Preservative Free (PF) Inj 1%: INTRAMUSCULAR | Qty: 30 | Status: AC

## 2017-06-15 NOTE — Progress Notes (Signed)
TR BAND REMOVAL  LOCATION:    R radial  DEFLATED PER PROTOCOL:    YES  TIME BAND OFF / DRESSING APPLIED:    20:00  SITE UPON ARRIVAL:    Level 0  SITE AFTER BAND REMOVAL:    Level  0  CIRCULATION SENSATION AND MOVEMENT:    Within Normal Limits   YES  COMMENTS:   Pt tolerated procedure well. Post Band removal instructions given. Will continue to monitor site throughout the night.

## 2017-06-15 NOTE — Progress Notes (Signed)
  Echocardiogram 2D Echocardiogram has been performed.  Anthony [name] 06/15/2017, 11:32 AM

## 2017-06-15 NOTE — Consult Note (Signed)
[address]       [place] [address]             [phone]        Quindarrius Gibler [place] Medical Record #[id number] Date of Birth: 1970/11/12  Referring: [name], MD Primary Care: Patient, No Pcp Per Cardiologist: new to [name], MD  Chief Complaint:    Chief Complaint  Patient presents with  . Chest Pain   Reason for consultation: Multivessel coronary artery disease  History of Present Illness:     This is a 46 year old male with a past medical history of smokeless tobacco who presented to [facility] with complaints of chest pain that radiated to both of his shoulders. The chest pain occurred while he was eating a barbecue chicken dinner. He made himself vomit (as he thought it might be indigestion) but continued to have chest pain. Patient has had this pain multiple times previously and has attributed to indigestion.  It normally occurs after eating and is relieved with belching or walking around.  The episode on 11/13 did not go away and lasted longer than usual.  He denies any shortness of breath or nausea but did have diaphoresis on this latest episode. He summonsed EMS. He was given aspirin and Nitroglycerin with resolution of his chest pain. EKG showed sinus rhythm, incomplete RBBB, possible anterolateral infarct. His initial Troponin I was 0.63 which subsequently increased to 1.59. He ruled in for a NSTEMI. He was put on Nitroglycerin and Heparin drips. He underwent a cardiac catheterization on 06/14/2017. Results showed ostial LAD and proximal LAD lesion 95% stenosed, Ramus Intermediate 50% stenosed, 100% Circumflex, and mild diffuse disease RCA. An echo has been done but results are currently unavailable. A cardiothoracic surgery consultation was requested with [name] for consideration of coronary artery bypass grafting surgery. His vital signs are stable and currently he does denies chest pain or shortness of breath. Of note, his good friend  [name] will assist him after his surgery at home [phone] and per patient, updates may be given to him.     Current Activity/ Functional Status: Patient is independent with mobility/ambulation, transfers, ADL's, IADL's.   Zubrod Score: At the time of surgery this patient's most appropriate activity status/level should be described as: []     0    Normal activity, no symptoms [x]     1    Restricted in physical strenuous activity but ambulatory, able to do out light work []     2    Ambulatory and capable of self care, unable to do work activities, up and about                 more than 50%  Of the time                            []     3    Only limited self care, in bed greater than 50% of waking hours []     4    Completely disabled, no self care, confined to bed or chair []     5    Moribund  Past Medical History:  Diagnosis Date  . Acid reflux   . NSTEMI (non-ST elevated myocardial infarction) ([facility]) 06/13/2017   [name] 06/14/2017  . Tailbone injury since 1993   "cracked"    Past Surgical History:  Procedure Laterality Date  .  CARDIAC CATHETERIZATION  06/14/2017  . LEFT HEART CATH AND CORONARY ANGIOGRAPHY N/A 06/14/2017   Procedure: LEFT HEART CATH AND CORONARY ANGIOGRAPHY;  Surgeon: Sherren Mocha, MD;  Location: [facility];  Service: Cardiovascular;  Laterality: N/A;  . TONSILLECTOMY  ~ 1977    Social History   Socioeconomic History  . Marital status: Divorced    Spouse name: Not on file  . Number of children: 2 a son who is 44 and a daughter who is 10  Occupational History  . 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 Smoking status: Never cigarette smoker  . Smokeless tobacco: Current User    Types: Snuff  Substance and Sexual Activity  . Alcohol use: Yes    Comment: 06/16/2017 "used to drink alot; pretty much stopped~ 01/2015; will have a couple drinks/month now"  . Drug use: No    Comment: 06/14/2017 "nothing since 2000"  . Sexual activity:  Yes   Allergy: No Known Allergies  Current Facility-Administered Medications  Medication Dose Route Frequency Provider Last Rate Last Dose  . 0.9 %  sodium chloride infusion  250 mL Intravenous PRN Sherren Mocha, MD      . acetaminophen (TYLENOL) tablet 650 mg  650 mg Oral Q4H PRN [name], MD   650 mg at 06/14/17 2144  . aspirin EC tablet 81 mg  81 mg Oral Daily [name], MD   81 mg at 06/15/17 0939  . atorvastatin (LIPITOR) tablet 80 mg  80 mg Oral q1800 Sherren Mocha, MD   80 mg at 06/14/17 1842  . diazepam (VALIUM) tablet 5 mg  5 mg Oral Q6H PRN Sherren Mocha, MD      . heparin ADULT infusion 100 units/mL (25000 units/226mL sodium chloride 0.45%)  1,400 Units/hr Intravenous Continuous Otilio Miu, RPH 14 mL/hr at 06/15/17 0600 1,400 Units/hr at 06/15/17 0600  . loratadine (CLARITIN) tablet 10 mg  10 mg Oral Daily PRN [name], MD      . morphine 4 MG/ML injection 2-4 mg  2-4 mg Intravenous Q1H PRN Sherren Mocha, MD      . nitroGLYCERIN (NITROSTAT) SL tablet 0.4 mg  0.4 mg Sublingual Q5 Min x 3 PRN [name], MD      . nitroGLYCERIN 50 mg in dextrose 5 % 250 mL (0.2 mg/mL) infusion  0-200 mcg/min Intravenous Titrated Daune Perch, NP 3 mL/hr at 06/14/17 2210 10 mcg/min at 06/14/17 2210  . ondansetron (ZOFRAN) injection 4 mg  4 mg Intravenous Q6H PRN [name], MD      . oxyCODONE (Oxy IR/ROXICODONE) immediate release tablet 5-10 mg  5-10 mg Oral Q4H PRN Sherren Mocha, MD      . pantoprazole (PROTONIX) EC tablet 40 mg  40 mg Oral Daily [name], MD   40 mg at 06/15/17 9563  . perflutren lipid microspheres (DEFINITY) IV suspension  1-10 mL Intravenous PRN Sherren Mocha, MD   2 mL at 06/15/17 1035  . sodium chloride flush (NS) 0.9 % injection 3 mL  3 mL Intravenous Q12H Sherren Mocha, MD   3 mL at 06/15/17 0941  . sodium chloride flush (NS) 0.9 % injection 3 mL  3 mL Intravenous PRN Sherren Mocha, MD        Medications Prior to Admission    Medication Sig Dispense Refill Last Dose  . esomeprazole (NEXIUM) 20 MG capsule Take 20 mg daily at 12 noon by mouth.   06/14/2017 at Unknown time  . loratadine (  CLARITIN) 10 MG tablet Take 10 mg daily as needed by mouth for allergies.   06/14/2017 at Unknown time   Family History: Mother is deceased at age 38, but did not have cardiac history Father is alive and is in his 59's. He has had coronary stent(s) Brother is alive and has persistent atrial fibrillation, PPM, and is obese.  Review of Systems:     Cardiac Review of Systems: Y or N  Chest Pain [  Y  ]  Resting SOB [ N  ] Exertional SOB  [ N ]  Orthopnea [ N ]   Pedal Edema [ N  ]    Palpitations [  ] Syncope  Aqua.Slicker  ]   Presyncope [ N  ]  General Review of Systems: [Y] = yes [N  ]=no Constitional:  nausea [ N ]; night sweats [ N ]; fever [N  ]; or chills [ N ]                                                     Eye : blurred vision [ N ]; diplopia [  N ];  Amaurosis fugax[  N]; Resp: cough [ N ];  wheezing[ N ];  hemoptysis[ N ];   GI:  vomiting[ N ];  dysphagia[ N ]; melena[N  ];  hematochezia Aqua.Slicker  ]; heartburn[Y  ];    GU: kidney stones [ N ]; hematuria[ N ];   dysuria Aqua.Slicker  ];             Skin: rash, swelling[ N ];, hair loss[ N ];   Musculosketetal: myalgias[ N ];  joint swelling[N  ];  joint erythema[ N ];  Heme/Lymph: bruising[N  ];  bleeding[ N ];  anemia[ N ];  Neuro: TIA[  ]; stroke[ N ];  vertigo[ N ];  seizures[ N ];  difficulty walking[ N ];  Psych:depression[N  ]; anxiety[ N ];  Endocrine: diabetes[N  ];  thyroid dysfunction[ N ];    Physical Exam: BP (!) 142/84 (BP Location: Left Arm)   Pulse 73   Temp 99 F (37.2 C) (Oral)   Resp 13   Ht 5\' 10"  (1.778 m)   Wt 215 lb 6.2 oz (97.7 kg)   SpO2 96%   BMI 30.91 kg/m    General appearance: alert, cooperative and no distress Head: Normocephalic, without obvious abnormality, atraumatic Neck: no carotid bruit, no JVD and supple, symmetrical, trachea midline Resp:  clear to auscultation bilaterally Cardio: RRR, no murmur GI: Soft, non tender, bowel sounds present Extremities: No LE edema;palpable DP/PT bilataterally Neurologic: Grossly normal  Diagnostic Studies & Laboratory data:     Recent Radiology Findings:   Dg Chest 2 View  Result Date: 06/13/2017 CLINICAL DATA:  46 year old male with chest pain and shortness of breath. EXAM: CHEST  2 VIEW COMPARISON:  None. FINDINGS: The heart size and mediastinal contours are within normal limits. Both lungs are clear. The visualized skeletal structures are unremarkable. IMPRESSION: No active cardiopulmonary disease. Electronically Signed   By: Anner Crete M.D.   On: 06/13/2017 23:52   LEFT HEART CATH AND CORONARY ANGIOGRAPHY by [name] on 06/14/2017:  Conclusion   1.  Severe long segment proximal LAD stenosis from the ostium of the LAD into the mid vessel spanning the origin of a large  first diagonal branch 2.  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  Mild nonobstructive irregularity of the left main and mild diffuse nonobstructive disease of the right coronary artery 4.  Mild segmental contraction abnormality of the left ventricle with preserved overall LV systolic function  Films reviewed with colleagues.  Considering the length of the patient's proximal LAD stenosis and involvement of a large first diagonal branch, favor CABG over PCI.  Discussed plan with patient who is in agreement.  Cardiac surgical consultation will be called.  We will resume heparin in 2 hours.  We will continue IV nitroglycerin.  Hopefully the patient can be treated with arterial conduit considering his young age.   Coronary Findings   Diagnostic  Dominance: Right  Left Main  Dist LM to Ost LAD lesion 30% stenosed  Dist LM to Ost LAD lesion is 30% stenosed.  Left Anterior Descending  Ost LAD to Prox LAD lesion 95%  stenosed  Ost LAD to Prox LAD lesion is 95% stenosed. There is severe stenosis throughout the proximal LAD with segmental plaquing extending beyond a large first diagonal branch.  Ramus Intermedius  Vessel is large.  Ost Ramus lesion 50% stenosed  Ost Ramus lesion is 50% stenosed. There is a 40-50% stenosis at the ostium of a large ramus intermedius branch  Left Circumflex  Collaterals  Dist Cx filled by collaterals from Post Atrio.    Prox Cx lesion 100% stenosed  Prox Cx lesion is 100% stenosed. Suspect chronic total occlusion, left to left and right-to-left collaterals are present.  Right Coronary Artery  There is mild diffuse disease throughout the vessel. Dominant vessel, diffuse irregularity throughout the proximal, mid, and distal vessel without any areas of hemodynamically significant stenosis.  Intervention   No interventions have been documented.  Wall Motion              Left Heart   Left Ventricle The left ventricular size is normal. The left ventricular systolic function is normal. LV end diastolic pressure is normal. The left ventricular ejection fraction is 50-55% by visual estimate. There are LV function abnormalities due to segmental dysfunction. There is a mild contraction abnormality of the anterolateral wall of the left ventricle with well-preserved LVEF estimated at 50-55%.  Coronary Diagrams   Diagnostic Diagram           I have independently reviewed the above radiologic studies.  Recent Lab Findings: Lab Results  Component Value Date   WBC 8.4 06/15/2017   HGB 14.3 06/15/2017   HCT 41.2 06/15/2017   PLT 258 06/15/2017   GLUCOSE 91 06/13/2017   CHOL 187 06/14/2017   TRIG 160 (H) 06/14/2017   HDL 26 (L) 06/14/2017   LDLCALC 129 (H) 06/14/2017   ALT 33 06/13/2017   AST 23 06/13/2017   NA 138 06/13/2017   K 3.8 06/13/2017   CL 103 06/13/2017   CREATININE 1.26 (H) 06/13/2017   BUN 11 06/13/2017   CO2 27 06/13/2017   INR 1.01 06/14/2017    HGBA1C 5.2 06/14/2017   Assessment / Plan:   1. S/p NSTEMI-has multivessel coronary artery disease. On Nitroglycerin and Heparin drips as well as Toprol Cl 25 mg daily. Will need coronary artery bypass grafting surgery, likely Monday 06/19/2017. May be able to use radial artery as conduit. [name] to evaluate.  2. Hyperlipidemia-Triglycerides 160, LDL 129, and HDL 26. On  Atorvastatin 80 mgdat hs. 3. History of smokeless tobacco-encouraged cessation 4. GERD-on Nexium prior to admission  Lars Pinks PA-C 06/15/2017 10:40 AM  I have seen and examined the patient, reviewed his history.  I discussed the case and the cineangiogram with [name] in detail.  With the patient's progressive anginal symptoms, positive troponin, complex proximal LAD lesion agree that coronary artery bypass grafting, possibly off-pump, offers the patient the best long-term outlook.  I discussed the risks and options of surgery with him, he is willing to proceed.  With his symptoms on presentation, now on IV heparin he should stay in the hospital until we can proceed on Monday.  Patient has had his questions answered and is.  Agreeable  I  spent 30 minutes counseling the patient face to face and 50% or more the  time was spent in counseling and coordination of care. The total time spent in the appointment was 55 minutes.  I have seen and examined Einar Pheasant Bossi and agree with the above assessment  and plan.  Grace Isaac MD Beeper [phone] Office [phone] 06/15/2017 5:46 PM

## 2017-06-15 NOTE — Progress Notes (Signed)
ANTICOAGULATION CONSULT NOTE - Follow Up Consult  Pharmacy Consult for Heparin Indication: LAD stenosis  No Known Allergies  Patient Measurements: Height: 5\' 10"  (177.8 cm) Weight: 215 lb 6.2 oz (97.7 kg) IBW/kg (Calculated) : 73 Heparin Dosing Weight: 94kg  Vital Signs: Temp: 99.5 F (37.5 C) (11/15 1358) Temp Source: Oral (11/15 1358) BP: 136/81 (11/15 1358) Pulse Rate: 66 (11/15 1358)  Labs: Recent Labs    06/13/17 2313  06/14/17 0754 06/15/17 0401 06/15/17 1216 06/15/17 2115  HGB 15.0  --   --  14.3  --   --   HCT 42.4  --   --  41.2  --   --   PLT 294  --   --  258  --   --   LABPROT  --   --  13.2  --   --   --   INR  --   --  1.01  --   --   --   HEPARINUNFRC  --    < > 0.23* 0.39 0.19* 0.48  CREATININE 1.26*  --   --   --   --   --   TROPONINI 0.63*  --  1.59*  --   --   --    < > = values in this interval not displayed.    Estimated Creatinine Clearance: 85.9 mL/min (A) (by C-G formula based on SCr of 1.26 mg/dL (H)).  Assessment: 46yom s/p cath today found to have severe LAD stenosis.   Heparin level was sub-therapeutic this AM and dose increased.  Heparin level is now therapeutic at 0.48 on 1400 units/hr.   Goal of Therapy:  Heparin level 0.3-0.7 units/ml Monitor platelets by anticoagulation protocol: Yes   Plan:  Continue heparin at 1550 units/hr Daily heparin level and CBC Follow-up on plans per [facility]   Sloan Leiter, PharmD, BCPS, BCCCP Clinical Pharmacist Clinical phone 06/15/2017 until 11PM - #[phone] After hours, please call #[phone] 06/15/2017, 10:27 PM

## 2017-06-15 NOTE — Progress Notes (Signed)
Progress Note  Patient Name: Anthony [name] Date of Encounter: 06/15/2017  Primary Cardiologist: Daneen Schick  Subjective   Accelerating angina presentation and angiography demonstrating extensive ostial to mid LAD obstruction involving a large diagonal.  Total occlusion of small mid circumflex collateralized from left to right, 40% proximal circumflex, and moderate but not significant right coronary disease.  Inpatient Medications    Scheduled Meds: . aspirin EC  81 mg Oral Daily  . atorvastatin  80 mg Oral q1800  . pantoprazole  40 mg Oral Daily  . sodium chloride flush  3 mL Intravenous Q12H   Continuous Infusions: . sodium chloride    . heparin 1,400 Units/hr (06/15/17 0600)  . nitroGLYCERIN 10 mcg/min (06/14/17 2210)   PRN Meds: sodium chloride, acetaminophen, diazepam, loratadine, morphine injection, nitroGLYCERIN, ondansetron (ZOFRAN) IV, oxyCODONE, perflutren lipid microspheres (DEFINITY) IV suspension, sodium chloride flush   Vital Signs    Vitals:   06/15/17 0105 06/15/17 0400 06/15/17 0445 06/15/17 0700  BP: 128/78 110/76  (!) 142/84  Pulse: (!) 52 (!) 59  73  Resp: 11 10  13   Temp:  98.4 F (36.9 C)  99 F (37.2 C)  TempSrc:  Oral  Oral  SpO2: 96% 96%  96%  Weight:   215 lb 6.2 oz (97.7 kg)   Height:        Intake/Output Summary (Last 24 hours) at 06/15/2017 1107 Last data filed at 06/15/2017 0700 Gross per 24 hour  Intake 2192.16 ml  Output 375 ml  Net 1817.16 ml   Filed Weights   06/13/17 2236 06/15/17 0445  Weight: 220 lb (99.8 kg) 215 lb 6.2 oz (97.7 kg)    Telemetry    Normal sinus rhythm- Personally Reviewed  ECG    Sinus rhythm, poor R wave progression, no acute ST-T wave change- Personally Reviewed  Physical Exam  Stable and appears comfortable GEN: No acute distress.   Neck: No JVD Cardiac: RRR, no murmurs, rubs, or gallops.  Radial cath site is unremarkable. Respiratory: Clear to auscultation bilaterally. GI: Soft, nontender,  non-distended  MS: No edema; No deformity. Neuro:  Nonfocal  Psych: Normal affect   Labs    Chemistry Recent Labs  Lab 06/13/17 2313  NA 138  K 3.8  CL 103  CO2 27  GLUCOSE 91  BUN 11  CREATININE 1.26*  CALCIUM 9.2  PROT 6.8  ALBUMIN 3.9  AST 23  ALT 33  ALKPHOS 46  BILITOT 0.6  GFRNONAA >60  GFRAA >60  ANIONGAP 8     Hematology Recent Labs  Lab 06/13/17 2313 06/15/17 0401  WBC 14.1* 8.4  RBC 4.87 4.68  HGB 15.0 14.3  HCT 42.4 41.2  MCV 87.1 88.0  MCH 30.8 30.6  MCHC 35.4 34.7  RDW 13.5 13.7  PLT 294 258    Cardiac Enzymes Recent Labs  Lab 06/13/17 2313 06/14/17 0754  TROPONINI 0.63* 1.59*   No results for input(s): TROPIPOC in the last 168 hours.   BNPNo results for input(s): BNP, PROBNP in the last 168 hours.   DDimer  Recent Labs  Lab 06/14/17 0037  DDIMER <0.27     Radiology    Dg Chest 2 View  Result Date: 06/13/2017 CLINICAL DATA:  46 year old male with chest pain and shortness of breath. EXAM: CHEST  2 VIEW COMPARISON:  None. FINDINGS: The heart size and mediastinal contours are within normal limits. Both lungs are clear. The visualized skeletal structures are unremarkable. IMPRESSION: No active cardiopulmonary disease. Electronically Signed  By: Anner Crete M.D.   On: 06/13/2017 23:52    Cardiac Studies   Coronary angiography 06/14/2017: Coronary Diagrams   Diagnostic Diagram          Patient Profile     46 y.o. male with 1 week history of recurring chest pressure and dyspnea provoked with minimal activity and on presentation spontaneously.  Angiography demonstrated high-grade proximal LAD disease with a large LAD distribution.  Assessment & Plan    1.  Severe proximal/ostial LAD with large vascular distribution presenting as unstable angina/non-ST elevation MI.  Currently stable on IV nitroglycerin and heparin.  Debated surgery versus percutaneous intervention.  My bias is [place] grafting to the mid LAD if possible  due to superior long-term patency.  Other disease can be managed percutaneously as it develops over time.  Obviously will attempt aggressive risk factor modification to avoid future cardiac events.  TCTS has been consult it.  The patient is in agreement with this plan.  2.  Stage II chronic kidney disease.  Trend kidney function 3.  Hyperlipidemia with LDL of 129.  Already on high-dose statin therapy  Will remain hospitalized until definitive therapy.  For questions or updates, please contact [place] Please consult [email or web address] for contact info under Cardiology/STEMI.      Signed, Sinclair Grooms, MD  06/15/2017, 11:07 AM

## 2017-06-15 NOTE — Progress Notes (Addendum)
ANTICOAGULATION CONSULT NOTE - Follow Up Consult  Pharmacy Consult for Heparin Indication: LAD stenosis  No Known Allergies  Patient Measurements: Height: 5\' 10"  (177.8 cm) Weight: 215 lb 6.2 oz (97.7 kg) IBW/kg (Calculated) : 73 Heparin Dosing Weight: 94kg  Vital Signs: Temp: 98.4 F (36.9 C) (11/15 0400) Temp Source: Oral (11/15 0400) BP: 110/76 (11/15 0400) Pulse Rate: 59 (11/15 0400)  Labs: Recent Labs    06/13/17 2313 06/14/17 0754 06/15/17 0401  HGB 15.0  --  14.3  HCT 42.4  --  41.2  PLT 294  --  258  LABPROT  --  13.2  --   INR  --  1.01  --   HEPARINUNFRC  --  0.23* 0.39  CREATININE 1.26*  --   --   TROPONINI 0.63* 1.59*  --     Estimated Creatinine Clearance: 85.9 mL/min (A) (by C-G formula based on SCr of 1.26 mg/dL (H)).  Assessment: 46yom s/p cath today found to have severe LAD stenosis. Heparin was resumed post-cath pending surgical evaluation for CABG at 2138. Level this morning (6.5 hrs after heparin resumed) came back within goal range at 0.39 on 1400 units/hr. No signs/symptoms of bleeding. No infusion issues per nursing.  Goal of Therapy:  Heparin level 0.3-0.7 units/ml Monitor platelets by anticoagulation protocol: Yes   Plan:  1) Continue heparin at 1400 units/hr 2) 6 hour confirmatory heparin level 3) Daily heparin level and CBC 4) Follow-up on plans per CTVS   Doylene Canard, PharmD Clinical Pharmacist  Pager: [phone] Clinical Phone for 06/15/2017 until 3:30pm: [phone] If after 3:30pm, please call main pharmacy at [facility]  Repeat heparin level came back below goal range at 0.19. No interruptions to heparin infusion per nursing. No s/sx of bleeding. Will increase rate to 1550 units/hr and recheck level in 6 hours.   Doylene Canard, PharmD  Clinical Pharmacist

## 2017-06-15 NOTE — Progress Notes (Signed)
CARDIAC REHAB PHASE I   PRE:  Rate/Rhythm: 78 SR    BP: sitting 134/95    SaO2:   MODE:  Ambulation: 450 ft   POST:  Rate/Rhythm: 86 SR    BP: sitting 142/92     SaO2:   Tolerated well, no angina (on NTG). Discussed sternal precautions, IS (2500+mL), mobility, d/c planning. Voiced understanding and has d/c plan. Planning to quit tobacco. Has materials re:OHS. Will watch video. [place], ACSM 06/15/2017 3:31 PM

## 2017-06-15 NOTE — Care Management Note (Addendum)
Case Management Note  Patient Details  Name: Anthony [name] MRN: [id number] Date of Birth: Jan 11, 1971  Subjective/Objective:     From home with kids age 46 and 15.  PTA indep, presents with NSTEMI, plan for CABG.  he has medication coverage but no PCP- NCM scheduled follow up with Dr. Fara Olden at [facility] 12/11 at 2:30 .  His best friend [name] will assist him after his surgery at home [phone].                 Action/Plan: NCM will follow for dc needs.   Expected Discharge Date:                  Expected Discharge Plan:  Home/Self Care  In-House Referral:     Discharge planning Services  CM Consult  Post Acute Care Choice:    Choice offered to:     DME Arranged:    DME Agency:     HH Arranged:    HH Agency:     Status of Service:  In process, will continue to follow  If discussed at Long Length of Stay Meetings, dates discussed:    Additional Comments:  Zenon Mayo, RN 06/15/2017, 10:53 AM

## 2017-06-16 ENCOUNTER — Encounter (HOSPITAL_COMMUNITY): Payer: BLUE CROSS/BLUE SHIELD

## 2017-06-16 ENCOUNTER — Inpatient Hospital Stay (HOSPITAL_COMMUNITY): Payer: BLUE CROSS/BLUE SHIELD

## 2017-06-16 DIAGNOSIS — Z01810 Encounter for preprocedural cardiovascular examination: Secondary | ICD-10-CM

## 2017-06-16 DIAGNOSIS — N183 Chronic kidney disease, stage 3 (moderate): Secondary | ICD-10-CM

## 2017-06-16 LAB — COMPREHENSIVE METABOLIC PANEL
ALT: 52 U/L (ref 17–63)
AST: 34 U/L (ref 15–41)
Albumin: 4 g/dL (ref 3.5–5.0)
Alkaline Phosphatase: 50 U/L (ref 38–126)
Anion gap: 7 (ref 5–15)
BUN: 10 mg/dL (ref 6–20)
CO2: 26 mmol/L (ref 22–32)
Calcium: 9.2 mg/dL (ref 8.9–10.3)
Chloride: 104 mmol/L (ref 101–111)
Creatinine, Ser: 1.11 mg/dL (ref 0.61–1.24)
GFR calc Af Amer: >60 mL/min (ref >60)
GFR calc non Af Amer: >60 mL/min (ref >60)
Glucose, Bld: 97 mg/dL (ref 65–99)
Potassium: 3.8 mmol/L (ref 3.5–5.1)
Sodium: 137 mmol/L (ref 135–145)
Total Bilirubin: 0.6 mg/dL (ref 0.3–1.2)
Total Protein: 7 g/dL (ref 6.5–8.1)

## 2017-06-16 LAB — PROTIME-INR
INR: 1.03
Prothrombin Time: 13.4 seconds (ref 11.4–15.2)

## 2017-06-16 LAB — CBC
HCT: 39.1 % (ref 39.0–52.0)
HEMOGLOBIN: 13.6 g/dL (ref 13.0–17.0)
MCH: 30.4 pg (ref 26.0–34.0)
MCHC: 34.8 g/dL (ref 30.0–36.0)
MCV: 87.5 fL (ref 78.0–100.0)
PLATELETS: 224 10*3/uL (ref 150–400)
RBC: 4.47 MIL/uL (ref 4.22–5.81)
RDW: 13.5 % (ref 11.5–15.5)
WBC: 7.3 10*3/uL (ref 4.0–10.5)

## 2017-06-16 LAB — TYPE AND SCREEN
ABO/RH(D): A POS
Antibody Screen: NEGATIVE

## 2017-06-16 LAB — BASIC METABOLIC PANEL
ANION GAP: 7 (ref 5–15)
BUN: 10 mg/dL (ref 6–20)
CALCIUM: 9.1 mg/dL (ref 8.9–10.3)
CO2: 25 mmol/L (ref 22–32)
CREATININE: 1.09 mg/dL (ref 0.61–1.24)
Chloride: 107 mmol/L (ref 101–111)
GFR calc Af Amer: >60 mL/min (ref >60)
GLUCOSE: 91 mg/dL (ref 65–99)
POTASSIUM: 3.9 mmol/L (ref 3.5–5.1)
Sodium: 139 mmol/L (ref 135–145)

## 2017-06-16 LAB — HEMOGLOBIN A1C
Hgb A1c MFr Bld: 5.2 % (ref 4.8–5.6)
Mean Plasma Glucose: 102.54 mg/dL

## 2017-06-16 LAB — ABO/RH: ABO/RH(D): A POS

## 2017-06-16 LAB — APTT: aPTT: 85 seconds — ABNORMAL HIGH (ref 24–36)

## 2017-06-16 LAB — HEPARIN LEVEL (UNFRACTIONATED): Heparin Unfractionated: 0.67 IU/mL (ref 0.30–0.70)

## 2017-06-16 MED ORDER — METOPROLOL TARTRATE 12.5 MG HALF TABLET: 12.5000 mg | ORAL_TABLET | Freq: Once | ORAL | Status: DC

## 2017-06-16 MED ORDER — CHLORHEXIDINE GLUCONATE CLOTH 2 % EX PADS
6.0000 | MEDICATED_PAD | Freq: Once | CUTANEOUS | Status: AC
  Administered 2017-06-19: 6 via TOPICAL

## 2017-06-16 MED ORDER — BISACODYL 5 MG PO TBEC
5.0000 mg | DELAYED_RELEASE_TABLET | Freq: Once | ORAL | Status: DC
  Filled 2017-06-16: qty 1

## 2017-06-16 MED ORDER — METOPROLOL SUCCINATE ER 50 MG PO TB24
50.0000 mg | ORAL_TABLET | Freq: Every day | ORAL | Status: DC
  Administered 2017-06-17 – 2017-06-18 (×2): 50 mg via ORAL
  Filled 2017-06-16 (×2): qty 1

## 2017-06-16 MED ORDER — CHLORHEXIDINE GLUCONATE 0.12 % MT SOLN
15.0000 mL | Freq: Once | OROMUCOSAL | Status: AC
  Administered 2017-06-19: 15 mL via OROMUCOSAL
  Filled 2017-06-16: qty 15

## 2017-06-16 MED ORDER — CHLORHEXIDINE GLUCONATE CLOTH 2 % EX PADS
6.0000 | MEDICATED_PAD | Freq: Once | CUTANEOUS | Status: AC
  Administered 2017-06-18: 6 via TOPICAL

## 2017-06-16 MED ORDER — TEMAZEPAM 15 MG PO CAPS
15.0000 mg | ORAL_CAPSULE | Freq: Once | ORAL | Status: AC | PRN
  Administered 2017-06-18: 15 mg via ORAL
  Filled 2017-06-16: qty 1

## 2017-06-16 NOTE — Progress Notes (Addendum)
Progress Note  Patient Name: Anthony [name] Date of Encounter: 06/16/2017  Primary Cardiologist: Linard Millers  Subjective   Had mild discomfort with going to the bathroom this morning.  Currently pain-free.  Discussed notifying his nurse if he has any discomfort at rest or any persisting discomfort.  Plan is to remain on IV nitroglycerin and heparin until surgery Monday.  Inpatient Medications    Scheduled Meds: . aspirin EC  81 mg Oral Daily  . atorvastatin  80 mg Oral q1800  . metoprolol succinate  25 mg Oral Daily  . pantoprazole  40 mg Oral Daily  . sodium chloride flush  3 mL Intravenous Q12H   Continuous Infusions: . sodium chloride 250 mL (06/15/17 1552)  . heparin 1,550 Units/hr (06/16/17 0821)  . nitroGLYCERIN 15 mcg/min (06/16/17 0655)   PRN Meds: sodium chloride, acetaminophen, diazepam, loratadine, morphine injection, nitroGLYCERIN, ondansetron (ZOFRAN) IV, oxyCODONE, sodium chloride flush   Vital Signs    Vitals:   06/15/17 1358 06/15/17 2256 06/16/17 0619 06/16/17 0855  BP: 136/81 119/80 128/84 132/87  Pulse: 66 (!) 53 (!) 53 72  Resp: 17     Temp: 99.5 F (37.5 C) 98 F (36.7 C) 98.1 F (36.7 C)   TempSrc: Oral Oral Oral   SpO2: 98% 98% 98%   Weight:   214 lb 3.2 oz (97.2 kg)   Height:        Intake/Output Summary (Last 24 hours) at 06/16/2017 0938 Last data filed at 06/15/2017 2359 Gross per 24 hour  Intake 1119.66 ml  Output 1500 ml  Net -380.34 ml   Filed Weights   06/13/17 2236 06/15/17 0445 06/16/17 0619  Weight: 220 lb (99.8 kg) 215 lb 6.2 oz (97.7 kg) 214 lb 3.2 oz (97.2 kg)    Telemetry    Normal sinus rhythm- Personally Reviewed  ECG    Performed on 06/15/17 reveals incomplete right bundle, poor R wave progression, ischemic T wave inversion in lead V2.  No acute ST-T wave changes noted. Personally reviewed.  Physical Exam  Calm and without concerns. GEN: No acute distress.   Neck: No JVD Cardiac: RRR, no murmurs, rubs, or  gallops.  Radial cath site is unremarkable. Respiratory: Clear to auscultation bilaterally. GI: Soft, nontender, non-distended  MS: No edema; No deformity. Neuro:  Nonfocal  Psych: Normal affect   Labs    Chemistry Recent Labs  Lab 06/13/17 2313 06/16/17 0555  NA 138 139  K 3.8 3.9  CL 103 107  CO2 27 25  GLUCOSE 91 91  BUN 11 10  CREATININE 1.26* 1.09  CALCIUM 9.2 9.1  PROT 6.8  --   ALBUMIN 3.9  --   AST 23  --   ALT 33  --   ALKPHOS 46  --   BILITOT 0.6  --   GFRNONAA >60 >60  GFRAA >60 >60  ANIONGAP 8 7     Hematology Recent Labs  Lab 06/13/17 2313 06/15/17 0401 06/16/17 0555  WBC 14.1* 8.4 7.3  RBC 4.87 4.68 4.47  HGB 15.0 14.3 13.6  HCT 42.4 41.2 39.1  MCV 87.1 88.0 87.5  MCH 30.8 30.6 30.4  MCHC 35.4 34.7 34.8  RDW 13.5 13.7 13.5  PLT 294 258 224    Cardiac Enzymes Recent Labs  Lab 06/13/17 2313 06/14/17 0754  TROPONINI 0.63* 1.59*   No results for input(s): TROPIPOC in the last 168 hours.   BNPNo results for input(s): BNP, PROBNP in the last 168 hours.  DDimer  Recent Labs  Lab 06/14/17 0037  DDIMER <0.27     Radiology    No results found.  Cardiac Studies   No new data  Patient Profile     46 y.o. male with 1 week history of recurring chest pressure and dyspnea provoked with minimal activity and on presentation spontaneously. Angiography demonstrated high-grade proximal LAD disease with a large LAD distribution.    Assessment & Plan    1.  Severe ostial LAD obstruction with planned surgery on Monday.  Presentation is ACS/non-ST elevation MI.  Plan to continue IV nitroglycerin and heparin.  Surgery would need to be done urgently if uncontrolled symptoms develop in the hospital.  I will further titrate beta-blocker therapy since he had pain this morning with minimal activity. 2.  Kidney function is back to baseline.    For questions or updates, please contact [place] Please consult [email or web address] for contact info  under Cardiology/STEMI.      Signed, Sinclair Grooms, MD  06/16/2017, 9:38 AM

## 2017-06-16 NOTE — Progress Notes (Signed)
Pre-CABG testing has been completed.  06/16/17 12:30 PM Anthony [name] RVT

## 2017-06-16 NOTE — Progress Notes (Signed)
ANTICOAGULATION CONSULT NOTE - Follow Up Consult  Pharmacy Consult for Heparin Indication: LAD stenosis  No Known Allergies  Patient Measurements: Height: 5\' 10"  (177.8 cm) Weight: 214 lb 3.2 oz (97.2 kg) IBW/kg (Calculated) : 73 Heparin Dosing Weight: 94kg  Vital Signs: Temp: 98.1 F (36.7 C) (11/16 0619) Temp Source: Oral (11/16 0619) BP: 132/87 (11/16 0855) Pulse Rate: 72 (11/16 0855)  Labs: Recent Labs    06/13/17 2313  06/14/17 0754 06/15/17 0401 06/15/17 1216 06/15/17 2115 06/16/17 0555  HGB 15.0  --   --  14.3  --   --  13.6  HCT 42.4  --   --  41.2  --   --  39.1  PLT 294  --   --  258  --   --  224  LABPROT  --   --  13.2  --   --   --   --   INR  --   --  1.01  --   --   --   --   HEPARINUNFRC  --    < > 0.23* 0.39 0.19* 0.48 0.67  CREATININE 1.26*  --   --   --   --   --  1.09  TROPONINI 0.63*  --  1.59*  --   --   --   --    < > = values in this interval not displayed.    Estimated Creatinine Clearance: 99.1 mL/min (by C-G formula based on SCr of 1.09 mg/dL).  Assessment: 46yom s/p cath today found to have severe LAD stenosis.   Heparin level last night came back at goal range. Repeat confirmatory level this morning came back at 0.67, within goal range on 1550 units/hr. CBC is stable. No s/sx of bleeding. No issues or interruptions with infusion per nursing.   Goal of Therapy:  Heparin level 0.3-0.7 units/ml Monitor platelets by anticoagulation protocol: Yes   Plan:  Continue heparin at 1550 units/hr Daily heparin level and CBC Follow-up on plans per CTVS   Doylene Canard, PharmD Clinical Pharmacist  Pager: [phone] Clinical Phone for 06/16/2017 until 3:30pm: [phone] If after 3:30pm, please call main pharmacy at [phone] 06/16/2017, 9:23 AM

## 2017-06-16 NOTE — Progress Notes (Signed)
[id number] Talked with cardiologist. Since pt had chest discomfort this morning with little activity, we will follow up after surgery for ambulation. Graylon Good RN BSN 06/16/2017 10:03 AM

## 2017-06-16 NOTE — Plan of Care (Signed)
Pt ambulates in room without difficulty; denies c/o CP or SOB.  Right radial cath site is level 0 and VSS.  Jodell Cipro

## 2017-06-16 NOTE — Progress Notes (Addendum)
Patient ID: Anthony [name], male   DOB: 30-Jul-1971, 46 y.o.   MRN: [id number]      [address]       [place] [address]             [phone]                 2 Days Post-Op Procedure(s) (LRB): LEFT HEART CATH AND CORONARY ANGIOGRAPHY (N/A)  LOS: 2 days   Subjective: Mild chest discomfort when going to the bathroom this morning, patient notes this was nothing like previous episodes. Has been comfortable the rest of the day  Objective: Vital signs in last 24 hours: Patient Vitals for the past 24 hrs:  BP Temp Temp src Pulse SpO2 Weight  06/16/17 1504 135/88 98.4 F (36.9 C) Oral 74 98 % -  06/16/17 0855 132/87 - - 72 - -  06/16/17 0619 128/84 98.1 F (36.7 C) Oral (!) 53 98 % 214 lb 3.2 oz (97.2 kg)  06/15/17 2256 119/80 98 F (36.7 C) Oral (!) 53 98 % -    Filed Weights   06/13/17 2236 06/15/17 0445 06/16/17 0619  Weight: 220 lb (99.8 kg) 215 lb 6.2 oz (97.7 kg) 214 lb 3.2 oz (97.2 kg)    Hemodynamic parameters for last 24 hours:    Intake/Output from previous day: 11/15 0701 - 11/16 0700 In: 1119.7 [P.O.:720; I.V.:399.7] Out: 1500 [Urine:1500] Intake/Output this shift: Total I/O In: 320.1 [I.V.:320.1] Out: -   Scheduled Meds: . aspirin EC  81 mg Oral Daily  . atorvastatin  80 mg Oral q1800  . [START ON 06/17/2017] metoprolol succinate  50 mg Oral Daily  . pantoprazole  40 mg Oral Daily  . sodium chloride flush  3 mL Intravenous Q12H   Continuous Infusions: . sodium chloride 250 mL (06/15/17 1552)  . heparin 1,550 Units/hr (06/16/17 0821)  . nitroGLYCERIN 15 mcg/min (06/16/17 0655)   PRN Meds:.sodium chloride, acetaminophen, diazepam, loratadine, morphine injection, nitroGLYCERIN, ondansetron (ZOFRAN) IV, oxyCODONE, sodium chloride flush  General appearance: alert, cooperative and no distress Neurologic: intact Heart: regular rate and rhythm, S1, S2 normal, no murmur, click, rub or gallop Lungs: clear to auscultation bilaterally Abdomen:  soft, non-tender; bowel sounds normal; no masses,  no organomegaly Extremities: extremities normal, atraumatic, no cyanosis or edema and Homans sign is negative, no sign of DVT Wound: Cath sites intact  Lab Results: CBC: Recent Labs    06/15/17 0401 06/16/17 0555  WBC 8.4 7.3  HGB 14.3 13.6  HCT 41.2 39.1  PLT 258 224   BMET:  Recent Labs    06/13/17 2313 06/16/17 0555  NA 138 139  K 3.8 3.9  CL 103 107  CO2 27 25  GLUCOSE 91 91  BUN 11 10  CREATININE 1.26* 1.09  CALCIUM 9.2 9.1    PT/INR:  Recent Labs    06/14/17 0754  LABPROT 13.2  INR 1.01     Radiology No results found.   Assessment/Plan: S/P Procedure(s) (LRB): LEFT HEART CATH AND CORONARY ANGIOGRAPHY (N/A) Plan to proceed with coronary artery bypass grafting on Monday possible, patient to stay in hospital on IV heparin Renal function has returned to baseline Risks and options have been discussed with the patient in detail and he is agreeable with proceeding. The goals risks and alternatives of the planned surgical procedure Procedure(s): Coronary artery bypass graft have been discussed with the patient in detail. The risks of the procedure including death, infection, stroke, myocardial infarction, bleeding, blood  transfusion have all been discussed specifically.  I have quoted Anthony [name] a 1% of perioperative mortality and a complication rate as high as 40 %. The patient's questions have been answered.Anthony [name] is willing  to proceed with the planned procedure.  Grace Isaac MD 06/16/2017 4:11 PM

## 2017-06-17 LAB — BLOOD GAS, ARTERIAL
Acid-Base Excess: 1.5 mmol/L (ref 0.0–2.0)
Bicarbonate: 25.6 mmol/L (ref 20.0–28.0)
Drawn by: 365921
FIO2: 21
O2 Saturation: 95.6 %
Patient temperature: 98.6
pCO2 arterial: 40.6 mmHg (ref 32.0–48.0)
pH, Arterial: 7.416 (ref 7.350–7.450)
pO2, Arterial: 77.9 mmHg — ABNORMAL LOW (ref 83.0–108.0)

## 2017-06-17 LAB — URINALYSIS, ROUTINE W REFLEX MICROSCOPIC
Bilirubin Urine: NEGATIVE
Glucose, UA: NEGATIVE mg/dL
Hgb urine dipstick: NEGATIVE
Ketones, ur: NEGATIVE mg/dL
Leukocytes, UA: NEGATIVE
Nitrite: NEGATIVE
Protein, ur: NEGATIVE mg/dL
Specific Gravity, Urine: 1.011 (ref 1.005–1.030)
pH: 6 (ref 5.0–8.0)

## 2017-06-17 LAB — CBC
HCT: 40 % (ref 39.0–52.0)
HEMOGLOBIN: 14 g/dL (ref 13.0–17.0)
MCH: 30.4 pg (ref 26.0–34.0)
MCHC: 35 g/dL (ref 30.0–36.0)
MCV: 87 fL (ref 78.0–100.0)
Platelets: 240 10*3/uL (ref 150–400)
RBC: 4.6 MIL/uL (ref 4.22–5.81)
RDW: 13.2 % (ref 11.5–15.5)
WBC: 7.3 10*3/uL (ref 4.0–10.5)

## 2017-06-17 LAB — BASIC METABOLIC PANEL
Anion gap: 7 (ref 5–15)
BUN: 10 mg/dL (ref 6–20)
CO2: 27 mmol/L (ref 22–32)
Calcium: 9.2 mg/dL (ref 8.9–10.3)
Chloride: 104 mmol/L (ref 101–111)
Creatinine, Ser: 1.09 mg/dL (ref 0.61–1.24)
GFR calc Af Amer: >60 mL/min (ref >60)
GFR calc non Af Amer: >60 mL/min (ref >60)
Glucose, Bld: 95 mg/dL (ref 65–99)
Potassium: 3.9 mmol/L (ref 3.5–5.1)
Sodium: 138 mmol/L (ref 135–145)

## 2017-06-17 LAB — HEPARIN LEVEL (UNFRACTIONATED)
Heparin Unfractionated: 0.77 IU/mL — ABNORMAL HIGH (ref 0.30–0.70)
Heparin Unfractionated: 0.66 IU/mL (ref 0.30–0.70)

## 2017-06-17 NOTE — Progress Notes (Signed)
ANTICOAGULATION CONSULT NOTE - Follow Up Consult  Pharmacy Consult for Heparin Indication: LAD stenosis  No Known Allergies  Patient Measurements: Height: 5\' 10"  (177.8 cm) Weight: 214 lb 14.4 oz (97.5 kg) IBW/kg (Calculated) : 73 Heparin Dosing Weight: 94kg  Vital Signs: Temp: 98.2 F (36.8 C) (11/17 0919) Temp Source: Oral (11/17 0919) BP: 122/82 (11/17 0919) Pulse Rate: 67 (11/17 0941)  Labs: Recent Labs    06/15/17 0401  06/16/17 0555 06/16/17 1837 06/17/17 0509 06/17/17 1229  HGB 14.3  --  13.6  --  14.0  --   HCT 41.2  --  39.1  --  40.0  --   PLT 258  --  224  --  240  --   APTT  --   --   --  85*  --   --   LABPROT  --   --   --  13.4  --   --   INR  --   --   --  1.03  --   --   HEPARINUNFRC 0.39   < > 0.67  --  0.77* 0.66  CREATININE  --   --  1.09 1.11 1.09  --    < > = values in this interval not displayed.    Estimated Creatinine Clearance: 99.2 mL/min (by C-G formula based on SCr of 1.09 mg/dL).  Assessment: 46yom s/p cath, found to have severe LAD stenosis. No PTA AC.  6 hour heparin level therapeutic at 0.66. CBC stable and no s/s bleeding per RN.   Goal of Therapy:  Heparin level 0.3-0.7 units/ml Monitor platelets by anticoagulation protocol: Yes   Plan:  Continue heparin gtt IV 1450 units/hr Daily heparin level and CBC Monitor for s/s bleeding   Nida Boatman, PharmD PGY1 Acute Care Pharmacy Resident Pager: [phone] 06/17/17 1:39 PM

## 2017-06-17 NOTE — Progress Notes (Signed)
ANTICOAGULATION CONSULT NOTE - Follow Up Consult  Pharmacy Consult for Heparin Indication: LAD stenosis  No Known Allergies  Patient Measurements: Height: 5\' 10"  (177.8 cm) Weight: 214 lb 14.4 oz (97.5 kg) IBW/kg (Calculated) : 73 Heparin Dosing Weight: 94kg  Vital Signs: Temp: 98 F (36.7 C) (11/17 0500) Temp Source: Oral (11/17 0500) BP: 126/92 (11/17 0500) Pulse Rate: 63 (11/17 0500)  Labs: Recent Labs    06/14/17 0754  06/15/17 0401  06/15/17 2115 06/16/17 0555 06/16/17 1837 06/17/17 0509  HGB  --    < > 14.3  --   --  13.6  --  14.0  HCT  --   --  41.2  --   --  39.1  --  40.0  PLT  --   --  258  --   --  224  --  240  APTT  --   --   --   --   --   --  85*  --   LABPROT 13.2  --   --   --   --   --  13.4  --   INR 1.01  --   --   --   --   --  1.03  --   HEPARINUNFRC 0.23*  --  0.39   < > 0.48 0.67  --  0.77*  CREATININE  --   --   --   --   --  1.09 1.11 1.09  TROPONINI 1.59*  --   --   --   --   --   --   --    < > = values in this interval not displayed.    Estimated Creatinine Clearance: 99.2 mL/min (by C-G formula based on SCr of 1.09 mg/dL).  Assessment: 46yom s/p cath, found to have severe LAD stenosis.   Heparin level supratherapeutic at 0.77 and noted to be trending up over the past few days. CBC stable and no s/s bleeding per RN.   Goal of Therapy:  Heparin level 0.3-0.7 units/ml Monitor platelets by anticoagulation protocol: Yes   Plan:  Decrease heparin gtt to 1450 units/hr Heparin level in 6 hrs Daily heparin level and CBC Monitor for s/s bleeding   Lavonda Jumbo, PharmD Clinical Pharmacist 06/17/17 6:57 AM

## 2017-06-17 NOTE — Progress Notes (Signed)
Progress Note  Patient Name: Anthony [name] Date of Encounter: 06/17/2017  Primary Cardiologist: Linard Millers  Subjective   No chest pain overnight. Vitals and labs stable. Plan for CABG on Monday with [name].  Inpatient Medications    Scheduled Meds: . aspirin EC  81 mg Oral Daily  . atorvastatin  80 mg Oral q1800  . [START ON 06/18/2017] bisacodyl  5 mg Oral Once  . [START ON 06/19/2017] chlorhexidine  15 mL Mouth/Throat Once  . [START ON 06/18/2017] Chlorhexidine Gluconate Cloth  6 each Topical Once   And  . [START ON 06/19/2017] Chlorhexidine Gluconate Cloth  6 each Topical Once  . metoprolol succinate  50 mg Oral Daily  . [START ON 06/19/2017] metoprolol tartrate  12.5 mg Oral Once  . pantoprazole  40 mg Oral Daily  . sodium chloride flush  3 mL Intravenous Q12H   Continuous Infusions: . sodium chloride 250 mL (06/16/17 1930)  . heparin 1,450 Units/hr (06/17/17 0701)  . nitroGLYCERIN 15 mcg/min (06/16/17 0655)   PRN Meds: sodium chloride, acetaminophen, diazepam, loratadine, morphine injection, nitroGLYCERIN, ondansetron (ZOFRAN) IV, oxyCODONE, sodium chloride flush, [START ON 06/18/2017] temazepam   Vital Signs    Vitals:   06/17/17 0051 06/17/17 0500 06/17/17 0919 06/17/17 0941  BP: 123/81 (!) 126/92 122/82   Pulse: (!) 55 63 62 67  Resp:      Temp: 98.1 F (36.7 C) 98 F (36.7 C) 98.2 F (36.8 C)   TempSrc: Oral Oral Oral   SpO2: 96% 96% 97%   Weight:  214 lb 14.4 oz (97.5 kg)    Height:        Intake/Output Summary (Last 24 hours) at 06/17/2017 1129 Last data filed at 06/17/2017 0400 Gross per 24 hour  Intake 975.02 ml  Output 475 ml  Net 500.02 ml   Filed Weights   06/15/17 0445 06/16/17 0619 06/17/17 0500  Weight: 215 lb 6.2 oz (97.7 kg) 214 lb 3.2 oz (97.2 kg) 214 lb 14.4 oz (97.5 kg)    Telemetry    Sinus rhythm - Personally Reviewed  ECG   N/A  Physical Exam   General appearance: alert and no distress Lungs: clear to  auscultation bilaterally Heart: regular rate and rhythm Extremities: extremities normal, atraumatic, no cyanosis or edema Neurologic: Grossly normal   Labs    Chemistry Recent Labs  Lab 06/13/17 2313 06/16/17 0555 06/16/17 1837 06/17/17 0509  NA 138 139 137 138  K 3.8 3.9 3.8 3.9  CL 103 107 104 104  CO2 27 25 26 27   GLUCOSE 91 91 97 95  BUN 11 10 10 10   CREATININE 1.26* 1.09 1.11 1.09  CALCIUM 9.2 9.1 9.2 9.2  PROT 6.8  --  7.0  --   ALBUMIN 3.9  --  4.0  --   AST 23  --  34  --   ALT 33  --  52  --   ALKPHOS 46  --  50  --   BILITOT 0.6  --  0.6  --   GFRNONAA >60 >60 >60 >60  GFRAA >60 >60 >60 >60  ANIONGAP 8 7 7 7      Hematology Recent Labs  Lab 06/15/17 0401 06/16/17 0555 06/17/17 0509  WBC 8.4 7.3 7.3  RBC 4.68 4.47 4.60  HGB 14.3 13.6 14.0  HCT 41.2 39.1 40.0  MCV 88.0 87.5 87.0  MCH 30.6 30.4 30.4  MCHC 34.7 34.8 35.0  RDW 13.7 13.5 13.2  PLT 258  224 240    Cardiac Enzymes Recent Labs  Lab 06/13/17 2313 06/14/17 0754  TROPONINI 0.63* 1.59*   No results for input(s): TROPIPOC in the last 168 hours.   BNPNo results for input(s): BNP, PROBNP in the last 168 hours.   DDimer  Recent Labs  Lab 06/14/17 0037  DDIMER <0.27     Radiology    No results found.  Cardiac Studies   No new data  Patient Profile     46 y.o. male with 1 week history of recurring chest pressure and dyspnea provoked with minimal activity and on presentation spontaneously. Angiography demonstrated high-grade proximal LAD disease with a large LAD distribution.   Assessment & Plan    1.  Severe ostial LAD obstruction with planned surgery on Monday.  Presentation is ACS/non-ST elevation MI.  Plan to continue IV nitroglycerin and heparin. No further chest pain.  2.  Kidney function is back to baseline.  Pixie Casino, MD, [facility], [place] Director of the Advanced Lipid Disorders &  Cardiovascular Risk Reduction  Clinic Attending Cardiologist  Direct Dial: [phone]  Fax: [phone]  Website:  [email or web address]  Pixie Casino, MD  06/17/2017, 11:29 AM

## 2017-06-18 ENCOUNTER — Encounter (HOSPITAL_COMMUNITY): Payer: Self-pay | Admitting: Anesthesiology

## 2017-06-18 LAB — CBC
HCT: 41 % (ref 39.0–52.0)
Hemoglobin: 14.1 g/dL (ref 13.0–17.0)
MCH: 30.1 pg (ref 26.0–34.0)
MCHC: 34.4 g/dL (ref 30.0–36.0)
MCV: 87.4 fL (ref 78.0–100.0)
PLATELETS: 253 10*3/uL (ref 150–400)
RBC: 4.69 MIL/uL (ref 4.22–5.81)
RDW: 13.3 % (ref 11.5–15.5)
WBC: 7.9 10*3/uL (ref 4.0–10.5)

## 2017-06-18 LAB — HEPARIN LEVEL (UNFRACTIONATED): Heparin Unfractionated: 0.63 IU/mL (ref 0.30–0.70)

## 2017-06-18 MED ORDER — SODIUM CHLORIDE 0.9 % IV SOLN
INTRAVENOUS | Status: DC
  Filled 2017-06-18: qty 1

## 2017-06-18 MED ORDER — SODIUM CHLORIDE 0.9 % IV SOLN
INTRAVENOUS | Status: DC
  Filled 2017-06-18: qty 30

## 2017-06-18 MED ORDER — DEXTROSE 5 % IV SOLN
750.0000 mg | INTRAVENOUS | Status: DC
  Filled 2017-06-18: qty 750

## 2017-06-18 MED ORDER — POTASSIUM CHLORIDE 2 MEQ/ML IV SOLN
80.0000 meq | INTRAVENOUS | Status: DC
  Filled 2017-06-18: qty 40

## 2017-06-18 MED ORDER — TRANEXAMIC ACID (OHS) PUMP PRIME SOLUTION
2.0000 mg/kg | INTRAVENOUS | Status: DC
  Filled 2017-06-18: qty 1.95

## 2017-06-18 MED ORDER — SODIUM CHLORIDE 0.9 % IV SOLN
30.0000 ug/min | INTRAVENOUS | Status: DC
  Filled 2017-06-18: qty 2

## 2017-06-18 MED ORDER — PLASMA-LYTE 148 IV SOLN
INTRAVENOUS | Status: AC
  Administered 2017-06-19: 500 mL
  Filled 2017-06-18: qty 2.5

## 2017-06-18 MED ORDER — CEFUROXIME SODIUM 1.5 G IV SOLR
1.5000 g | INTRAVENOUS | Status: AC
  Administered 2017-06-19: 1.5 g via INTRAVENOUS
  Filled 2017-06-18: qty 1.5

## 2017-06-18 MED ORDER — VANCOMYCIN HCL 10 G IV SOLR
1500.0000 mg | INTRAVENOUS | Status: AC
  Administered 2017-06-19: 1500 mg via INTRAVENOUS
  Filled 2017-06-18: qty 1500

## 2017-06-18 MED ORDER — MILRINONE LACTATE IN DEXTROSE 20-5 MG/100ML-% IV SOLN
0.1250 ug/kg/min | INTRAVENOUS | Status: DC
  Filled 2017-06-18: qty 100

## 2017-06-18 MED ORDER — TRANEXAMIC ACID (OHS) BOLUS VIA INFUSION
15.0000 mg/kg | INTRAVENOUS | Status: AC
  Administered 2017-06-19: 1462.5 mg via INTRAVENOUS
  Filled 2017-06-18: qty 1463

## 2017-06-18 MED ORDER — NITROGLYCERIN IN D5W 200-5 MCG/ML-% IV SOLN
2.0000 ug/min | INTRAVENOUS | Status: AC
  Administered 2017-06-19: 15 ug/min via INTRAVENOUS
  Filled 2017-06-18: qty 250

## 2017-06-18 MED ORDER — DEXMEDETOMIDINE HCL IN NACL 400 MCG/100ML IV SOLN
0.1000 ug/kg/h | INTRAVENOUS | Status: DC
  Filled 2017-06-18: qty 100

## 2017-06-18 MED ORDER — DOPAMINE-DEXTROSE 3.2-5 MG/ML-% IV SOLN
0.0000 ug/kg/min | INTRAVENOUS | Status: DC
  Filled 2017-06-18: qty 250

## 2017-06-18 MED ORDER — DEXTROSE 5 % IV SOLN
0.0000 ug/min | INTRAVENOUS | Status: DC
  Filled 2017-06-18: qty 4

## 2017-06-18 MED ORDER — MAGNESIUM SULFATE 50 % IJ SOLN
40.0000 meq | INTRAMUSCULAR | Status: DC
  Filled 2017-06-18: qty 9.85

## 2017-06-18 MED ORDER — SODIUM CHLORIDE 0.9 % IV SOLN
1.5000 mg/kg/h | INTRAVENOUS | Status: AC
  Administered 2017-06-19: 1.5 mg/kg/h via INTRAVENOUS
  Filled 2017-06-18: qty 25

## 2017-06-18 NOTE — Progress Notes (Addendum)
ANTICOAGULATION CONSULT NOTE - Follow Up Consult  Pharmacy Consult for Heparin Indication: LAD stenosis  No Known Allergies  Patient Measurements: Height: 5\' 10"  (177.8 cm) Weight: 214 lb 14.4 oz (97.5 kg) IBW/kg (Calculated) : 73 Heparin Dosing Weight: 94kg  Vital Signs:    Labs: Recent Labs    06/16/17 0555 06/16/17 1837 06/17/17 0509 06/17/17 1229 06/18/17 0519  HGB 13.6  --  14.0  --  14.1  HCT 39.1  --  40.0  --  41.0  PLT 224  --  240  --  253  APTT  --  85*  --   --   --   LABPROT  --  13.4  --   --   --   INR  --  1.03  --   --   --   HEPARINUNFRC 0.67  --  0.77* 0.66 0.63  CREATININE 1.09 1.11 1.09  --   --     Estimated Creatinine Clearance: 99.2 mL/min (by C-G formula based on SCr of 1.09 mg/dL).  Assessment: 46yom s/p cath, found to have severe LAD stenosis. No PTA AC.  Heparin level therapeutic x2. CBC stable and no s/s bleeding per RN. Planned CABG procedure scheduled for 0715 on 11/19.    Goal of Therapy:  Heparin level 0.3-0.7 units/ml Monitor platelets by anticoagulation protocol: Yes   Plan:  Continue heparin gtt IV 1450 units/hr until procedure, hold as appropriate for CABG  Daily heparin level and CBC Monitor for s/s bleeding   Nida Boatman, PharmD PGY1 Acute Care Pharmacy Resident Pager: [phone] 06/18/17 11:06 AM

## 2017-06-18 NOTE — Anesthesia Preprocedure Evaluation (Addendum)
Anesthesia Evaluation  Patient identified by MRN, date of birth, ID band Patient awake    Reviewed: Allergy & Precautions, NPO status , Patient's Chart, lab work & pertinent test results, reviewed documented beta blocker date and time   History of Anesthesia Complications (+) AWARENESS UNDER ANESTHESIA  Airway Mallampati: II  TM Distance: >3 FB Neck ROM: Full    Dental  (+) Teeth Intact, Dental Advisory Given, Chipped,    Pulmonary neg pulmonary ROS,    breath sounds clear to auscultation       Cardiovascular + Past MI   Rhythm:Regular Rate:Bradycardia     Neuro/Psych negative neurological ROS     GI/Hepatic Neg liver ROS, GERD  Medicated and Controlled,  Endo/Other  negative endocrine ROS  Renal/GU Renal disease     Musculoskeletal negative musculoskeletal ROS (+)   Abdominal   Peds  Hematology negative hematology ROS (+)   Anesthesia Other Findings Day of surgery medications reviewed with the patient.  Reproductive/Obstetrics                           Lab Results  Component Value Date   WBC 7.9 06/18/2017   HGB 14.1 06/18/2017   HCT 41.0 06/18/2017   MCV 87.4 06/18/2017   PLT 253 06/18/2017   Echo: - Left ventricle: The cavity size was normal. Wall thickness was   normal. Systolic function was normal. The estimated ejection   fraction was in the range of 55% to 60%.  Anesthesia Physical Anesthesia Plan  ASA: IV  Anesthesia Plan: General   Post-op Pain Management:    Induction: Intravenous  PONV Risk Score and Plan:   Airway Management Planned: Oral ETT  Additional Equipment: Arterial line, CVP, PA Cath, TEE and Ultrasound Guidance Line Placement  Intra-op Plan: Utilization Of Total Body Hypothermia per surgeon request  Post-operative Plan: Post-operative intubation/ventilation  Informed Consent: I have reviewed the patients History and Physical, chart, labs and  discussed the procedure including the risks, benefits and alternatives for the proposed anesthesia with the patient or authorized representative who has indicated his/her understanding and acceptance.   Dental advisory given  Plan Discussed with: CRNA, Anesthesiologist and Surgeon  Anesthesia Plan Comments:        Anesthesia Quick Evaluation

## 2017-06-18 NOTE — Progress Notes (Addendum)
Patient c/o chest pain, tightness. No sob, diaphoresis, n/v. Increased nitro due to 4.5 mL hr. EKG done. BP stable, HR on the lower side (50s) Patient stated he felt better. Advised to avoid a lot of movement, schedule for CABG 11/19. Will continue to monitor.

## 2017-06-18 NOTE — Progress Notes (Signed)
Progress Note  Patient Name: Anthony [name] Date of Encounter: 06/18/2017  Primary Cardiologist: Dr Tamala Julian  Subjective   He feels well today a little nervous about surgery tomorrow.   Inpatient Medications    Scheduled Meds: . aspirin EC  81 mg Oral Daily  . atorvastatin  80 mg Oral q1800  . bisacodyl  5 mg Oral Once  . [START ON 06/19/2017] chlorhexidine  15 mL Mouth/Throat Once  . Chlorhexidine Gluconate Cloth  6 each Topical Once   And  . [START ON 06/19/2017] Chlorhexidine Gluconate Cloth  6 each Topical Once  . [START ON 06/19/2017] heparin-papaverine-plasmalyte irrigation   Irrigation To OR  . [START ON 06/19/2017] magnesium sulfate  40 mEq Other To OR  . metoprolol succinate  50 mg Oral Daily  . [START ON 06/19/2017] metoprolol tartrate  12.5 mg Oral Once  . pantoprazole  40 mg Oral Daily  . [START ON 06/19/2017] potassium chloride  80 mEq Other To OR  . sodium chloride flush  3 mL Intravenous Q12H  . [START ON 06/19/2017] tranexamic acid  15 mg/kg Intravenous To OR  . [START ON 06/19/2017] tranexamic acid  2 mg/kg Intracatheter To OR   Continuous Infusions: . sodium chloride 250 mL (06/17/17 1924)  . [START ON 06/19/2017] cefUROXime (ZINACEF)  IV    . [START ON 06/19/2017] cefUROXime (ZINACEF)  IV    . [START ON 06/19/2017] dexmedetomidine    . [START ON 06/19/2017] DOPamine    . [START ON 06/19/2017] epinephrine    . [START ON 06/19/2017] heparin 30,000 units/NS 1000 mL solution for CELLSAVER    . heparin 1,450 Units/hr (06/17/17 0701)  . [START ON 06/19/2017] insulin (NOVOLIN-R) infusion    . [START ON 06/19/2017] milrinone    . nitroGLYCERIN 10 mcg/min (06/18/17 0741)  . [START ON 06/19/2017] nitroGLYCERIN    . [START ON 06/19/2017] phenylephrine 20mg /278mL NS (0.08mg /ml) infusion    . [START ON 06/19/2017] tranexamic acid (CYKLOKAPRON) infusion ([facility])    . [START ON 06/19/2017] vancomycin     PRN Meds: sodium chloride, acetaminophen, diazepam, loratadine,  morphine injection, nitroGLYCERIN, ondansetron (ZOFRAN) IV, oxyCODONE, sodium chloride flush, temazepam   Vital Signs    Vitals:   06/17/17 0941 06/17/17 1353 06/17/17 1618 06/17/17 2144  BP:  (!) 145/88 134/77 136/84  Pulse: 67 62 60 (!) 56  Resp:   18 18  Temp:  98.2 F (36.8 C) 99.1 F (37.3 C) 98.7 F (37.1 C)  TempSrc:  Oral Oral Oral  SpO2:  98% 99% 98%  Weight:      Height:        Intake/Output Summary (Last 24 hours) at 06/18/2017 1159 Last data filed at 06/18/2017 0300 Gross per 24 hour  Intake 820 ml  Output -  Net 820 ml   Filed Weights   06/15/17 0445 06/16/17 0619 06/17/17 0500  Weight: 215 lb 6.2 oz (97.7 kg) 214 lb 3.2 oz (97.2 kg) 214 lb 14.4 oz (97.5 kg)    Telemetry    SR - Personally Reviewed  ECG    SR, anterior ischemia - Personally Reviewed  Physical Exam   GEN: No acute distress.   Neck: No JVD Cardiac: RRR, no murmurs, rubs, or gallops.  Respiratory: Clear to auscultation bilaterally. GI: Soft, nontender, non-distended  MS: No edema; No deformity. Neuro:  Nonfocal  Psych: Normal affect   Labs    Chemistry Recent Labs  Lab 06/13/17 2313 06/16/17 0555 06/16/17 1837 06/17/17 0509  NA 138 139 137  138  K 3.8 3.9 3.8 3.9  CL 103 107 104 104  CO2 27 25 26 27   GLUCOSE 91 91 97 95  BUN 11 10 10 10   CREATININE 1.26* 1.09 1.11 1.09  CALCIUM 9.2 9.1 9.2 9.2  PROT 6.8  --  7.0  --   ALBUMIN 3.9  --  4.0  --   AST 23  --  34  --   ALT 33  --  52  --   ALKPHOS 46  --  50  --   BILITOT 0.6  --  0.6  --   GFRNONAA >60 >60 >60 >60  GFRAA >60 >60 >60 >60  ANIONGAP 8 7 7 7      Hematology Recent Labs  Lab 06/16/17 0555 06/17/17 0509 06/18/17 0519  WBC 7.3 7.3 7.9  RBC 4.47 4.60 4.69  HGB 13.6 14.0 14.1  HCT 39.1 40.0 41.0  MCV 87.5 87.0 87.4  MCH 30.4 30.4 30.1  MCHC 34.8 35.0 34.4  RDW 13.5 13.2 13.3  PLT 224 240 253    Cardiac Enzymes Recent Labs  Lab 06/13/17 2313 06/14/17 0754  TROPONINI 0.63* 1.59*   No  results for input(s): TROPIPOC in the last 168 hours.   BNPNo results for input(s): BNP, PROBNP in the last 168 hours.   DDimer  Recent Labs  Lab 06/14/17 0037  DDIMER <0.27     Radiology    No results found.  Cardiac Studies   TTE: 06/15/17 Left ventricle: The cavity size was normal. Wall thickness was   normal. Systolic function was normal. The estimated ejection   fraction was in the range of 55% to 60%. No significant valvular abnormalities.  Patient Profile     46 y.o. male with 1 week history of recurring chest pressure and dyspnea provoked with minimal activity and on presentation spontaneously. Angiography demonstrated high-grade proximal LAD disease with a large LAD distribution.   Assessment & Plan    1.  Severe ostial LAD obstruction with planned surgery on Monday.  Presentation is ACS/non-ST elevation MI.  Plan to continue IV nitroglycerin and heparin. No further chest pain. On heparin drip. Telemetry unremarkable. BP well controlled.  2.  Kidney function is back to baseline.  For questions or updates, please contact [place] Please consult [email or web address] for contact info under Cardiology/STEMI.   Signed, Ena Dawley, MD  06/18/2017, 11:59 AM

## 2017-06-18 NOTE — Progress Notes (Signed)
Patient has no complained of any chest pressure or pain.  Headaches have been managed with Tylenol.  Plan is for CABG on Monday.

## 2017-06-19 ENCOUNTER — Inpatient Hospital Stay (HOSPITAL_COMMUNITY): Payer: BLUE CROSS/BLUE SHIELD | Admitting: Anesthesiology

## 2017-06-19 ENCOUNTER — Encounter (HOSPITAL_COMMUNITY): Payer: Self-pay | Admitting: Certified Registered Nurse Anesthetist

## 2017-06-19 ENCOUNTER — Encounter (HOSPITAL_COMMUNITY)
Admission: EM | Disposition: A | Discharge status: Home / Self Care | Payer: Self-pay | Source: Home / Self Care | Attending: Interventional Cardiology

## 2017-06-19 ENCOUNTER — Inpatient Hospital Stay (HOSPITAL_COMMUNITY): Payer: BLUE CROSS/BLUE SHIELD

## 2017-06-19 DIAGNOSIS — I2510 Atherosclerotic heart disease of native coronary artery without angina pectoris: Secondary | ICD-10-CM

## 2017-06-19 HISTORY — PX: CORONARY ARTERY BYPASS GRAFT: SHX141

## 2017-06-19 HISTORY — PX: TEE WITHOUT CARDIOVERSION: SHX5443

## 2017-06-19 LAB — POCT I-STAT 3, ART BLOOD GAS (G3+)
ACID-BASE DEFICIT: 1 mmol/L (ref 0.0–2.0)
ACID-BASE DEFICIT: 3 mmol/L — AB (ref 0.0–2.0)
ACID-BASE DEFICIT: 4 mmol/L — AB (ref 0.0–2.0)
ACID-BASE EXCESS: 2 mmol/L (ref 0.0–2.0)
BICARBONATE: 23.8 mmol/L (ref 20.0–28.0)
Bicarbonate: 25.9 mmol/L (ref 20.0–28.0)
Bicarbonate: 24.5 mmol/L (ref 20.0–28.0)
Bicarbonate: 22.1 mmol/L (ref 20.0–28.0)
O2 SAT: 98 %
O2 Saturation: 100 %
O2 Saturation: 98 %
O2 Saturation: 97 %
PH ART: 7.286 — AB (ref 7.350–7.450)
PO2 ART: 110 mmHg — AB (ref 83.0–108.0)
PO2 ART: 110 mmHg — AB (ref 83.0–108.0)
Patient temperature: 36.9
TCO2: 27 mmol/L (ref 22–32)
TCO2: 26 mmol/L (ref 22–32)
TCO2: 25 mmol/L (ref 22–32)
TCO2: 23 mmol/L (ref 22–32)
pCO2 arterial: 37.7 mmHg (ref 32.0–48.0)
pCO2 arterial: 41.9 mmHg (ref 32.0–48.0)
pCO2 arterial: 50 mmHg — ABNORMAL HIGH (ref 32.0–48.0)
pCO2 arterial: 43.5 mmHg (ref 32.0–48.0)
pH, Arterial: 7.444 (ref 7.350–7.450)
pH, Arterial: 7.368 (ref 7.350–7.450)
pH, Arterial: 7.313 — ABNORMAL LOW (ref 7.350–7.450)
pO2, Arterial: 519 mmHg — ABNORMAL HIGH (ref 83.0–108.0)
pO2, Arterial: 104 mmHg (ref 83.0–108.0)

## 2017-06-19 LAB — POCT I-STAT, CHEM 8
BUN: 10 mg/dL (ref 6–20)
BUN: 10 mg/dL (ref 6–20)
BUN: 10 mg/dL (ref 6–20)
BUN: 13 mg/dL (ref 6–20)
CALCIUM ION: 1.26 mmol/L (ref 1.15–1.40)
CALCIUM ION: 1.28 mmol/L (ref 1.15–1.40)
CREATININE: 0.8 mg/dL (ref 0.61–1.24)
CREATININE: 0.9 mg/dL (ref 0.61–1.24)
Calcium, Ion: 1.21 mmol/L (ref 1.15–1.40)
Calcium, Ion: 1.13 mmol/L — ABNORMAL LOW (ref 1.15–1.40)
Chloride: 104 mmol/L (ref 101–111)
Chloride: 105 mmol/L (ref 101–111)
Chloride: 106 mmol/L (ref 101–111)
Chloride: 106 mmol/L (ref 101–111)
Creatinine, Ser: 0.8 mg/dL (ref 0.61–1.24)
Creatinine, Ser: 0.9 mg/dL (ref 0.61–1.24)
GLUCOSE: 101 mg/dL — AB (ref 65–99)
Glucose, Bld: 92 mg/dL (ref 65–99)
Glucose, Bld: 96 mg/dL (ref 65–99)
Glucose, Bld: 121 mg/dL — ABNORMAL HIGH (ref 65–99)
HCT: 38 % — ABNORMAL LOW (ref 39.0–52.0)
HCT: 35 % — ABNORMAL LOW (ref 39.0–52.0)
HCT: 37 % — ABNORMAL LOW (ref 39.0–52.0)
HEMATOCRIT: 34 % — AB (ref 39.0–52.0)
HEMOGLOBIN: 11.6 g/dL — AB (ref 13.0–17.0)
Hemoglobin: 12.9 g/dL — ABNORMAL LOW (ref 13.0–17.0)
Hemoglobin: 11.9 g/dL — ABNORMAL LOW (ref 13.0–17.0)
Hemoglobin: 12.6 g/dL — ABNORMAL LOW (ref 13.0–17.0)
Potassium: 4 mmol/L (ref 3.5–5.1)
Potassium: 4.1 mmol/L (ref 3.5–5.1)
Potassium: 3.9 mmol/L (ref 3.5–5.1)
Potassium: 4.8 mmol/L (ref 3.5–5.1)
SODIUM: 141 mmol/L (ref 135–145)
SODIUM: 141 mmol/L (ref 135–145)
SODIUM: 139 mmol/L (ref 135–145)
Sodium: 139 mmol/L (ref 135–145)
TCO2: 30 mmol/L (ref 22–32)
TCO2: 28 mmol/L (ref 22–32)
TCO2: 24 mmol/L (ref 22–32)
TCO2: 23 mmol/L (ref 22–32)

## 2017-06-19 LAB — PROTIME-INR
INR: 1.19
Prothrombin Time: 15 seconds (ref 11.4–15.2)

## 2017-06-19 LAB — CBC WITH DIFFERENTIAL/PLATELET
Basophils Absolute: 0 10*3/uL (ref 0.0–0.1)
Basophils Relative: 0 %
Eosinophils Absolute: 0 10*3/uL (ref 0.0–0.7)
Eosinophils Relative: 0 %
HCT: 37.6 % — ABNORMAL LOW (ref 39.0–52.0)
Hemoglobin: 13.1 g/dL (ref 13.0–17.0)
Lymphocytes Relative: 6 %
Lymphs Abs: 0.8 10*3/uL (ref 0.7–4.0)
MCH: 30.5 pg (ref 26.0–34.0)
MCHC: 34.8 g/dL (ref 30.0–36.0)
MCV: 87.6 fL (ref 78.0–100.0)
Monocytes Absolute: 0.7 10*3/uL (ref 0.1–1.0)
Monocytes Relative: 5 %
Neutro Abs: 12.8 10*3/uL — ABNORMAL HIGH (ref 1.7–7.7)
Neutrophils Relative %: 89 %
Platelets: 223 10*3/uL (ref 150–400)
RBC: 4.29 MIL/uL (ref 4.22–5.81)
RDW: 13.2 % (ref 11.5–15.5)
WBC: 14.3 10*3/uL — ABNORMAL HIGH (ref 4.0–10.5)

## 2017-06-19 LAB — CBC
HCT: 34.3 % — ABNORMAL LOW (ref 39.0–52.0)
HEMATOCRIT: 42.4 % (ref 39.0–52.0)
HEMOGLOBIN: 14.8 g/dL (ref 13.0–17.0)
Hemoglobin: 12 g/dL — ABNORMAL LOW (ref 13.0–17.0)
MCH: 30.8 pg (ref 26.0–34.0)
MCH: 30.3 pg (ref 26.0–34.0)
MCHC: 34.9 g/dL (ref 30.0–36.0)
MCHC: 35 g/dL (ref 30.0–36.0)
MCV: 88.1 fL (ref 78.0–100.0)
MCV: 86.6 fL (ref 78.0–100.0)
PLATELETS: 198 10*3/uL (ref 150–400)
Platelets: 242 10*3/uL (ref 150–400)
RBC: 4.81 MIL/uL (ref 4.22–5.81)
RBC: 3.96 MIL/uL — AB (ref 4.22–5.81)
RDW: 13.3 % (ref 11.5–15.5)
RDW: 13.1 % (ref 11.5–15.5)
WBC: 8.9 10*3/uL (ref 4.0–10.5)
WBC: 8.5 10*3/uL (ref 4.0–10.5)

## 2017-06-19 LAB — GLUCOSE, CAPILLARY
GLUCOSE-CAPILLARY: 107 mg/dL — AB (ref 65–99)
GLUCOSE-CAPILLARY: 115 mg/dL — AB (ref 65–99)
GLUCOSE-CAPILLARY: 117 mg/dL — AB (ref 65–99)
Glucose-Capillary: 139 mg/dL — ABNORMAL HIGH (ref 65–99)
Glucose-Capillary: 96 mg/dL (ref 65–99)

## 2017-06-19 LAB — POCT I-STAT 4, (NA,K, GLUC, HGB,HCT)
Glucose, Bld: 98 mg/dL (ref 65–99)
HEMATOCRIT: 32 % — AB (ref 39.0–52.0)
HEMOGLOBIN: 10.9 g/dL — AB (ref 13.0–17.0)
POTASSIUM: 3.8 mmol/L (ref 3.5–5.1)
SODIUM: 143 mmol/L (ref 135–145)

## 2017-06-19 LAB — APTT: aPTT: 30 seconds (ref 24–36)

## 2017-06-19 LAB — HEPARIN LEVEL (UNFRACTIONATED): Heparin Unfractionated: 0.67 IU/mL (ref 0.30–0.70)

## 2017-06-19 LAB — MAGNESIUM: Magnesium: 2.7 mg/dL — ABNORMAL HIGH (ref 1.7–2.4)

## 2017-06-19 LAB — CREATININE, SERUM
Creatinine, Ser: 1.05 mg/dL (ref 0.61–1.24)
GFR calc Af Amer: >60 mL/min (ref >60)
GFR calc non Af Amer: >60 mL/min (ref >60)

## 2017-06-19 SURGERY — CORONARY ARTERY BYPASS GRAFTING (CABG)
Anesthesia: General | Site: Chest

## 2017-06-19 MED ORDER — SODIUM CHLORIDE 0.9 % IV SOLN
INTRAVENOUS | Status: DC | PRN
  Administered 2017-06-19: .6 [IU]/h via INTRAVENOUS

## 2017-06-19 MED ORDER — ACETAMINOPHEN 160 MG/5ML PO SOLN: 1000.0000 mg | Freq: Four times a day (QID) | ORAL | Status: DC

## 2017-06-19 MED ORDER — ACETAMINOPHEN 160 MG/5ML PO SOLN
650.0000 mg | Freq: Once | ORAL | Status: AC
  Filled 2017-06-19: qty 20.3

## 2017-06-19 MED ORDER — POTASSIUM CHLORIDE 10 MEQ/50ML IV SOLN
10.0000 meq | INTRAVENOUS | Status: AC
  Administered 2017-06-19 (×3): 10 meq via INTRAVENOUS

## 2017-06-19 MED ORDER — DEXTROSE 5 % IV SOLN
1.5000 g | Freq: Two times a day (BID) | INTRAVENOUS | Status: AC
  Administered 2017-06-19 – 2017-06-21 (×4): 1.5 g via INTRAVENOUS
  Filled 2017-06-19 (×4): qty 1.5

## 2017-06-19 MED ORDER — DEXTROSE 10 % IV SOLN: INTRAVENOUS | Status: DC | PRN

## 2017-06-19 MED ORDER — SODIUM CHLORIDE 0.9% FLUSH
3.0000 mL | Freq: Two times a day (BID) | INTRAVENOUS | Status: DC
  Administered 2017-06-20: 10 mL via INTRAVENOUS
  Administered 2017-06-21 – 2017-06-22 (×5): 3 mL via INTRAVENOUS

## 2017-06-19 MED ORDER — MIDAZOLAM HCL 10 MG/2ML IJ SOLN
INTRAMUSCULAR | Status: AC
  Filled 2017-06-19: qty 2

## 2017-06-19 MED ORDER — MORPHINE SULFATE (PF) 4 MG/ML IV SOLN
1.0000 mg | INTRAVENOUS | Status: AC | PRN
  Administered 2017-06-19 (×2): 2 mg via INTRAVENOUS
  Filled 2017-06-19 (×3): qty 1

## 2017-06-19 MED ORDER — ROCURONIUM BROMIDE 100 MG/10ML IV SOLN
INTRAVENOUS | Status: DC | PRN
  Administered 2017-06-19: 50 mg via INTRAVENOUS
  Administered 2017-06-19: 70 mg via INTRAVENOUS
  Administered 2017-06-19: 30 mg via INTRAVENOUS

## 2017-06-19 MED ORDER — HEMOSTATIC AGENTS (NO CHARGE) OPTIME
TOPICAL | Status: DC | PRN
  Administered 2017-06-19: 1 via TOPICAL

## 2017-06-19 MED ORDER — LACTATED RINGERS IV SOLN
INTRAVENOUS | Status: DC | PRN
  Administered 2017-06-19 (×2): via INTRAVENOUS

## 2017-06-19 MED ORDER — DEXMEDETOMIDINE HCL 200 MCG/2ML IV SOLN
INTRAVENOUS | Status: DC | PRN
  Administered 2017-06-19: 0.2 ug/kg/h via INTRAVENOUS

## 2017-06-19 MED ORDER — HEPARIN SODIUM (PORCINE) 1000 UNIT/ML IJ SOLN
INTRAMUSCULAR | Status: DC | PRN
  Administered 2017-06-19: 15 mL via INTRAVENOUS

## 2017-06-19 MED ORDER — VANCOMYCIN HCL IN DEXTROSE 1-5 GM/200ML-% IV SOLN
1000.0000 mg | Freq: Once | INTRAVENOUS | Status: AC
  Administered 2017-06-19: 1000 mg via INTRAVENOUS
  Filled 2017-06-19: qty 200

## 2017-06-19 MED ORDER — DOCUSATE SODIUM 100 MG PO CAPS
200.0000 mg | ORAL_CAPSULE | Freq: Every day | ORAL | Status: DC
  Administered 2017-06-20 – 2017-06-23 (×4): 200 mg via ORAL
  Filled 2017-06-19 (×4): qty 2

## 2017-06-19 MED ORDER — METOPROLOL TARTRATE 12.5 MG HALF TABLET
12.5000 mg | ORAL_TABLET | Freq: Two times a day (BID) | ORAL | Status: DC
  Administered 2017-06-20 – 2017-06-23 (×6): 12.5 mg via ORAL
  Filled 2017-06-19 (×6): qty 1

## 2017-06-19 MED ORDER — INSULIN REGULAR BOLUS VIA INFUSION
0.0000 [IU] | Freq: Three times a day (TID) | INTRAVENOUS | Status: DC
  Filled 2017-06-19: qty 10

## 2017-06-19 MED ORDER — THROMBIN 20000 UNITS EX KIT
PACK | CUTANEOUS | Status: AC
  Filled 2017-06-19: qty 1

## 2017-06-19 MED ORDER — PROPOFOL 10 MG/ML IV BOLUS
INTRAVENOUS | Status: DC | PRN
  Administered 2017-06-19: 130 mg via INTRAVENOUS

## 2017-06-19 MED ORDER — MIDAZOLAM HCL 5 MG/5ML IJ SOLN
INTRAMUSCULAR | Status: DC | PRN
  Administered 2017-06-19 (×2): 2 mg via INTRAVENOUS
  Administered 2017-06-19: 1 mg via INTRAVENOUS
  Administered 2017-06-19: 2 mg via INTRAVENOUS

## 2017-06-19 MED ORDER — ALBUMIN HUMAN 5 % IV SOLN
250.0000 mL | INTRAVENOUS | Status: AC | PRN
  Administered 2017-06-19 (×2): 250 mL via INTRAVENOUS

## 2017-06-19 MED ORDER — MAGNESIUM SULFATE 4 GM/100ML IV SOLN
4.0000 g | Freq: Once | INTRAVENOUS | Status: AC
  Administered 2017-06-19: 4 g via INTRAVENOUS
  Filled 2017-06-19: qty 100

## 2017-06-19 MED ORDER — PROTAMINE SULFATE 10 MG/ML IV SOLN
INTRAVENOUS | Status: DC | PRN
Start: 2017-06-19 — End: 2017-06-19
  Administered 2017-06-19 (×7): 20 mg via INTRAVENOUS
  Administered 2017-06-19: 10 mg via INTRAVENOUS

## 2017-06-19 MED ORDER — LACTATED RINGERS IV SOLN
INTRAVENOUS | Status: DC
  Administered 2017-06-20: via INTRAVENOUS

## 2017-06-19 MED ORDER — PROTAMINE SULFATE 10 MG/ML IV SOLN
INTRAVENOUS | Status: AC
  Filled 2017-06-19: qty 20

## 2017-06-19 MED ORDER — PANTOPRAZOLE SODIUM 40 MG PO TBEC
40.0000 mg | DELAYED_RELEASE_TABLET | Freq: Every day | ORAL | Status: DC
  Administered 2017-06-21 – 2017-06-23 (×3): 40 mg via ORAL
  Filled 2017-06-19 (×3): qty 1

## 2017-06-19 MED ORDER — FENTANYL CITRATE (PF) 250 MCG/5ML IJ SOLN
INTRAMUSCULAR | Status: DC | PRN
  Administered 2017-06-19 (×2): 100 ug via INTRAVENOUS
  Administered 2017-06-19: 50 ug via INTRAVENOUS
  Administered 2017-06-19: 150 ug via INTRAVENOUS
  Administered 2017-06-19: 200 ug via INTRAVENOUS
  Administered 2017-06-19: 150 ug via INTRAVENOUS
  Administered 2017-06-19 (×2): 250 ug via INTRAVENOUS
  Administered 2017-06-19: 100 ug via INTRAVENOUS
  Administered 2017-06-19: 250 ug via INTRAVENOUS

## 2017-06-19 MED ORDER — NITROGLYCERIN IN D5W 200-5 MCG/ML-% IV SOLN: 0.0000 ug/min | INTRAVENOUS | Status: DC

## 2017-06-19 MED ORDER — SODIUM CHLORIDE 0.9 % IV SOLN: 250.0000 mL | INTRAVENOUS | Status: DC

## 2017-06-19 MED ORDER — FENTANYL CITRATE (PF) 250 MCG/5ML IJ SOLN
INTRAMUSCULAR | Status: AC
  Filled 2017-06-19: qty 5

## 2017-06-19 MED ORDER — BISACODYL 5 MG PO TBEC
10.0000 mg | DELAYED_RELEASE_TABLET | Freq: Every day | ORAL | Status: DC
  Administered 2017-06-20 – 2017-06-23 (×4): 10 mg via ORAL
  Filled 2017-06-19 (×4): qty 2

## 2017-06-19 MED ORDER — OXYCODONE HCL 5 MG PO TABS
5.0000 mg | ORAL_TABLET | ORAL | Status: DC | PRN
  Administered 2017-06-19 – 2017-06-22 (×6): 10 mg via ORAL
  Filled 2017-06-19 (×6): qty 2

## 2017-06-19 MED ORDER — TRAMADOL HCL 50 MG PO TABS
50.0000 mg | ORAL_TABLET | ORAL | Status: DC | PRN
  Administered 2017-06-19 – 2017-06-21 (×5): 100 mg via ORAL
  Administered 2017-06-22: 50 mg via ORAL
  Filled 2017-06-19 (×5): qty 2
  Filled 2017-06-19: qty 1

## 2017-06-19 MED ORDER — BISACODYL 10 MG RE SUPP: 10.0000 mg | Freq: Every day | RECTAL | Status: DC

## 2017-06-19 MED ORDER — LACTATED RINGERS IV SOLN
INTRAVENOUS | Status: DC | PRN
  Administered 2017-06-19: 07:00:00 via INTRAVENOUS

## 2017-06-19 MED ORDER — SODIUM CHLORIDE 0.9 % IV SOLN: INTRAVENOUS | Status: DC

## 2017-06-19 MED ORDER — SODIUM CHLORIDE 0.45 % IV SOLN: INTRAVENOUS | Status: DC | PRN

## 2017-06-19 MED ORDER — ASPIRIN EC 325 MG PO TBEC
325.0000 mg | DELAYED_RELEASE_TABLET | Freq: Every day | ORAL | Status: DC
  Administered 2017-06-20: 325 mg via ORAL
  Filled 2017-06-19: qty 1

## 2017-06-19 MED ORDER — PROPOFOL 10 MG/ML IV BOLUS
INTRAVENOUS | Status: AC
  Filled 2017-06-19: qty 20

## 2017-06-19 MED ORDER — INSULIN ASPART 100 UNIT/ML ~~LOC~~ SOLN
0.0000 [IU] | SUBCUTANEOUS | Status: AC
  Administered 2017-06-19: 2 [IU] via SUBCUTANEOUS

## 2017-06-19 MED ORDER — ASPIRIN 81 MG PO CHEW: 324.0000 mg | CHEWABLE_TABLET | Freq: Every day | ORAL | Status: DC

## 2017-06-19 MED ORDER — HEPARIN SODIUM (PORCINE) 1000 UNIT/ML IJ SOLN
INTRAMUSCULAR | Status: AC
  Filled 2017-06-19: qty 1

## 2017-06-19 MED ORDER — LIDOCAINE 2% (20 MG/ML) 5 ML SYRINGE
INTRAMUSCULAR | Status: AC
  Filled 2017-06-19: qty 5

## 2017-06-19 MED ORDER — ONDANSETRON HCL 4 MG/2ML IJ SOLN: 4.0000 mg | Freq: Four times a day (QID) | INTRAMUSCULAR | Status: DC | PRN

## 2017-06-19 MED ORDER — METOPROLOL TARTRATE 5 MG/5ML IV SOLN: 2.5000 mg | INTRAVENOUS | Status: DC | PRN

## 2017-06-19 MED ORDER — INSULIN ASPART 100 UNIT/ML ~~LOC~~ SOLN: 1.0000 [IU] | SUBCUTANEOUS | Status: DC

## 2017-06-19 MED ORDER — FENTANYL CITRATE (PF) 250 MCG/5ML IJ SOLN
INTRAMUSCULAR | Status: AC
  Filled 2017-06-19: qty 30

## 2017-06-19 MED ORDER — LACTATED RINGERS IV SOLN: 500.0000 mL | Freq: Once | INTRAVENOUS | Status: DC | PRN

## 2017-06-19 MED ORDER — 0.9 % SODIUM CHLORIDE (POUR BTL) OPTIME
TOPICAL | Status: DC | PRN
  Administered 2017-06-19: 6000 mL

## 2017-06-19 MED ORDER — SODIUM CHLORIDE 0.9% FLUSH: 3.0000 mL | INTRAVENOUS | Status: DC | PRN

## 2017-06-19 MED ORDER — MORPHINE SULFATE (PF) 4 MG/ML IV SOLN
2.0000 mg | INTRAVENOUS | Status: DC | PRN
  Administered 2017-06-20: 4 mg via INTRAVENOUS
  Administered 2017-06-20 (×3): 2 mg via INTRAVENOUS
  Filled 2017-06-19 (×3): qty 1

## 2017-06-19 MED ORDER — MIDAZOLAM HCL 2 MG/2ML IJ SOLN
2.0000 mg | INTRAMUSCULAR | Status: DC | PRN
  Filled 2017-06-19: qty 2

## 2017-06-19 MED ORDER — SODIUM CHLORIDE 0.9 % IJ SOLN
OROMUCOSAL | Status: DC | PRN
  Administered 2017-06-19: 4 mL via TOPICAL

## 2017-06-19 MED ORDER — PHENYLEPHRINE HCL 10 MG/ML IJ SOLN: 0.0000 ug/min | INTRAMUSCULAR | Status: DC

## 2017-06-19 MED ORDER — SUCCINYLCHOLINE CHLORIDE 200 MG/10ML IV SOSY
PREFILLED_SYRINGE | INTRAVENOUS | Status: AC
  Filled 2017-06-19: qty 10

## 2017-06-19 MED ORDER — ACETAMINOPHEN 500 MG PO TABS
1000.0000 mg | ORAL_TABLET | Freq: Four times a day (QID) | ORAL | Status: DC
  Administered 2017-06-19 – 2017-06-23 (×12): 1000 mg via ORAL
  Filled 2017-06-19 (×13): qty 2

## 2017-06-19 MED ORDER — THROMBIN (RECOMBINANT) 5000 UNITS EX SOLR
CUTANEOUS | Status: AC
  Filled 2017-06-19: qty 15000

## 2017-06-19 MED ORDER — FAMOTIDINE IN NACL 20-0.9 MG/50ML-% IV SOLN
20.0000 mg | Freq: Two times a day (BID) | INTRAVENOUS | Status: AC
  Administered 2017-06-19 (×2): 20 mg via INTRAVENOUS
  Filled 2017-06-19: qty 50

## 2017-06-19 MED ORDER — ACETAMINOPHEN 650 MG RE SUPP: 650.0000 mg | Freq: Once | RECTAL | Status: AC

## 2017-06-19 MED ORDER — EPHEDRINE 5 MG/ML INJ
INTRAVENOUS | Status: AC
  Filled 2017-06-19: qty 10

## 2017-06-19 MED ORDER — METOPROLOL TARTRATE 25 MG/10 ML ORAL SUSPENSION: 12.5000 mg | Freq: Two times a day (BID) | ORAL | Status: DC

## 2017-06-19 MED ORDER — DEXMEDETOMIDINE HCL IN NACL 400 MCG/100ML IV SOLN
0.0000 ug/kg/h | INTRAVENOUS | Status: DC
  Administered 2017-06-19: 0.2 ug/kg/h via INTRAVENOUS
  Filled 2017-06-19: qty 100

## 2017-06-19 MED ORDER — LACTATED RINGERS IV SOLN: INTRAVENOUS | Status: DC

## 2017-06-19 MED ORDER — CHLORHEXIDINE GLUCONATE 0.12 % MT SOLN
15.0000 mL | OROMUCOSAL | Status: AC
  Administered 2017-06-19: 15 mL via OROMUCOSAL

## 2017-06-19 SURGICAL SUPPLY — 86 items
BAG DECANTER FOR FLEXI CONT (MISCELLANEOUS) ×3 IMPLANT
BANDAGE ACE 4X5 VEL STRL LF (GAUZE/BANDAGES/DRESSINGS) IMPLANT
BANDAGE ACE 6X5 VEL STRL LF (GAUZE/BANDAGES/DRESSINGS) IMPLANT
BLADE STERNUM SYSTEM 6 (BLADE) ×3 IMPLANT
BLOWER MISTER CAL-MED (MISCELLANEOUS) ×3 IMPLANT
BNDG GAUZE ELAST 4 BULKY (GAUZE/BANDAGES/DRESSINGS) IMPLANT
CANISTER SUCT 3000ML PPV (MISCELLANEOUS) ×3 IMPLANT
CATH CPB KIT GERHARDT (MISCELLANEOUS) ×3 IMPLANT
CATH THORACIC 28FR (CATHETERS) ×3 IMPLANT
CRADLE DONUT ADULT HEAD (MISCELLANEOUS) ×3 IMPLANT
DRAIN CHANNEL 28F RND 3/8 FF (WOUND CARE) ×3 IMPLANT
DRAPE CARDIOVASCULAR INCISE (DRAPES) ×1
DRAPE SLUSH/WARMER DISC (DRAPES) ×3 IMPLANT
DRAPE SRG 135X102X78XABS (DRAPES) ×2 IMPLANT
DRSG AQUACEL AG ADV 3.5X14 (GAUZE/BANDAGES/DRESSINGS) ×3 IMPLANT
ELECT BLADE 4.0 EZ CLEAN MEGAD (MISCELLANEOUS) ×3
ELECT REM PT RETURN 9FT ADLT (ELECTROSURGICAL) ×6
ELECTRODE BLDE 4.0 EZ CLN MEGD (MISCELLANEOUS) ×2 IMPLANT
ELECTRODE REM PT RTRN 9FT ADLT (ELECTROSURGICAL) ×4 IMPLANT
FELT TEFLON 1X6 (MISCELLANEOUS) ×3 IMPLANT
GAUZE SPONGE 4X4 12PLY STRL (GAUZE/BANDAGES/DRESSINGS) ×6 IMPLANT
GAUZE SPONGE 4X4 12PLY STRL LF (GAUZE/BANDAGES/DRESSINGS) ×3 IMPLANT
GLOVE BIO SURGEON STRL SZ 6.5 (GLOVE) ×18 IMPLANT
GLOVE BIOGEL PI IND STRL 6.5 (GLOVE) ×8 IMPLANT
GLOVE BIOGEL PI INDICATOR 6.5 (GLOVE) ×4
GOWN STRL REUS W/ TWL LRG LVL3 (GOWN DISPOSABLE) ×8 IMPLANT
GOWN STRL REUS W/TWL LRG LVL3 (GOWN DISPOSABLE) ×4
HEMOSTAT POWDER SURGIFOAM 1G (HEMOSTASIS) ×9 IMPLANT
HEMOSTAT SURGICEL 2X14 (HEMOSTASIS) ×3 IMPLANT
KIT BASIN OR (CUSTOM PROCEDURE TRAY) ×3 IMPLANT
KIT CATH SUCT 8FR (CATHETERS) ×3 IMPLANT
KIT ROOM TURNOVER OR (KITS) ×3 IMPLANT
KIT SUCTION CATH 14FR (SUCTIONS) ×12 IMPLANT
KIT VASOVIEW HEMOPRO VH 3000 (KITS) IMPLANT
LEAD PACING MYOCARDI (MISCELLANEOUS) ×3 IMPLANT
LINE EXTENSION DELIVERY (MISCELLANEOUS) ×3 IMPLANT
MARKER GRAFT CORONARY BYPASS (MISCELLANEOUS) ×9 IMPLANT
NS IRRIG 1000ML POUR BTL (IV SOLUTION) ×18 IMPLANT
OFFPUMP STABILIZER SUV (MISCELLANEOUS) ×3 IMPLANT
PACK OPEN HEART (CUSTOM PROCEDURE TRAY) ×3 IMPLANT
PAD ARMBOARD 7.5X6 YLW CONV (MISCELLANEOUS) ×6 IMPLANT
PAD CARDIAC INSULATION (MISCELLANEOUS) ×3 IMPLANT
PAD ELECT DEFIB RADIOL ZOLL (MISCELLANEOUS) ×3 IMPLANT
PENCIL BUTTON HOLSTER BLD 10FT (ELECTRODE) ×3 IMPLANT
POSITIONER ACROBAT-I OFFPUMP (MISCELLANEOUS) ×3 IMPLANT
SUT BONE WAX W31G (SUTURE) ×3 IMPLANT
SUT ETHIBOND 2 0 SH (SUTURE) ×4
SUT ETHIBOND 2 0 SH 36X2 (SUTURE) ×8 IMPLANT
SUT PROLENE 3 0 SH1 36 (SUTURE) ×3 IMPLANT
SUT PROLENE 4 0 RB 1 (SUTURE) ×1
SUT PROLENE 4 0 TF (SUTURE) ×6 IMPLANT
SUT PROLENE 4-0 RB1 .5 CRCL 36 (SUTURE) ×2 IMPLANT
SUT PROLENE 5 0 C 1 36 (SUTURE) ×6 IMPLANT
SUT PROLENE 6 0 C 1 30 (SUTURE) ×6 IMPLANT
SUT PROLENE 6 0 CC (SUTURE) ×6 IMPLANT
SUT PROLENE 7 0 BV1 MDA (SUTURE) ×3 IMPLANT
SUT PROLENE 8 0 BV175 6 (SUTURE) ×3 IMPLANT
SUT SILK  1 MH (SUTURE) ×3
SUT SILK 1 MH (SUTURE) ×6 IMPLANT
SUT SILK 1 TIES 10X30 (SUTURE) ×3 IMPLANT
SUT SILK 2 0 SH CR/8 (SUTURE) ×6 IMPLANT
SUT SILK 2 0 TIES 10X30 (SUTURE) ×3 IMPLANT
SUT SILK 2 0 TIES 17X18 (SUTURE) ×1
SUT SILK 2-0 18XBRD TIE BLK (SUTURE) ×2 IMPLANT
SUT SILK 3 0 SH CR/8 (SUTURE) ×3 IMPLANT
SUT SILK 4 0 TIE 10X30 (SUTURE) ×6 IMPLANT
SUT STEEL 6MS V (SUTURE) ×3 IMPLANT
SUT STEEL SZ 6 DBL 3X14 BALL (SUTURE) ×3 IMPLANT
SUT TEM PAC WIRE 2 0 SH (SUTURE) ×12 IMPLANT
SUT VIC AB 1 CTX 18 (SUTURE) ×6 IMPLANT
SUT VIC AB 2-0 CTX 27 (SUTURE) ×6 IMPLANT
SUT VIC AB 3-0 X1 27 (SUTURE) ×6 IMPLANT
SUTURE E-PAK OPEN HEART (SUTURE) IMPLANT
SYSTEM SAHARA CHEST DRAIN ATS (WOUND CARE) ×3 IMPLANT
TABLE PACK (MISCELLANEOUS) ×3 IMPLANT
TAPE CLOTH SURG 4X10 WHT LF (GAUZE/BANDAGES/DRESSINGS) ×3 IMPLANT
TAPE PAPER 3X10 WHT MICROPORE (GAUZE/BANDAGES/DRESSINGS) ×3 IMPLANT
TAPES RETRACTO (MISCELLANEOUS) ×3 IMPLANT
TOWEL GREEN STERILE (TOWEL DISPOSABLE) IMPLANT
TOWEL GREEN STERILE FF (TOWEL DISPOSABLE) ×3 IMPLANT
TOWEL OR 17X24 6PK STRL BLUE (TOWEL DISPOSABLE) IMPLANT
TOWEL OR 17X26 10 PK STRL BLUE (TOWEL DISPOSABLE) IMPLANT
TRAY FOLEY SILVER 16FR TEMP (SET/KITS/TRAYS/PACK) ×3 IMPLANT
TUBING INSUFFLATION (TUBING) ×3 IMPLANT
UNDERPAD 30X30 (UNDERPADS AND DIAPERS) ×3 IMPLANT
WATER STERILE IRR 1000ML POUR (IV SOLUTION) ×6 IMPLANT

## 2017-06-19 NOTE — Brief Op Note (Addendum)
      [address]       [place] [address]             [phone]      06/13/2017 - 06/19/2017  10:35 AM  PATIENT:  Anthony [name]  46 y.o. male  PRE-OPERATIVE DIAGNOSIS: High-grade LAD stenosis  POST-OPERATIVE DIAGNOSIS: Same  PROCEDURE: TRANSESOPHAGEAL ECHOCARDIOGRAM (TEE),MEDIAN STERNOTOMY for CORONARY ARTERY BYPASS GRAFTING (CABG) x 1 (LIMA to LAD), OFF PUMP, using the left internal mammary artery to LAD   SURGEON:  Surgeon(s) and Role:    Grace Isaac, MD - Primary  PHYSICIAN ASSISTANT: Lars Pinks PA-C  ASSISTANTS: Ara Kussmaul RNFA  ANESTHESIA:   general  BLOOD ADMINISTERED:none  DRAINS: Chest tubes placed in the mediastinal and pleural spaces   COUNTS CORRECT:  YES  DICTATION: .Dragon Dictation  PLAN OF CARE: Admit to inpatient   PATIENT DISPOSITION:  ICU - intubated and hemodynamically stable.   Delay start of Pharmacological VTE agent (>24hrs) due to surgical blood loss or risk of bleeding: yes  BASELINE WEIGHT: 96.3 kg

## 2017-06-19 NOTE — Progress Notes (Signed)
[facility] BRIEF SICU PROGRESS NOTE  Day of Surgery  S/P Procedure(s) (LRB): CORONARY ARTERY BYPASS GRAFTING (CABG), OFF PUMP, times one using the left internal mammary artery to LAD (N/A) TRANSESOPHAGEAL ECHOCARDIOGRAM (TEE) (N/A)   Extubated uneventfully NSR w/ stable BP Breathing comfortably w/ O2 sats 98% Chest tube output low  Plan: Continue routine early postop  Rexene Alberts, MD 06/19/2017 7:34 PM

## 2017-06-19 NOTE — Progress Notes (Signed)
Rapid weaning protocol 

## 2017-06-19 NOTE — Progress Notes (Signed)
      [address]       [place] [address]             [phone]     Pre Procedure note for inpatients:   Anthony [name] has been scheduled for Procedure(s): CORONARY ARTERY BYPASS GRAFTING (CABG) (N/A) TRANSESOPHAGEAL ECHOCARDIOGRAM (TEE) (N/A) today. The various methods of treatment have been discussed with the patient. After consideration of the risks, benefits and treatment options the patient has consented to the planned procedure.   The patient has been seen and labs reviewed. There are no changes in the patient's condition to prevent proceeding with the planned procedure today.  Recent labs:  Lab Results  Component Value Date   WBC 8.9 06/19/2017   HGB 14.8 06/19/2017   HCT 42.4 06/19/2017   PLT 242 06/19/2017   GLUCOSE 95 06/17/2017   CHOL 187 06/14/2017   TRIG 160 (H) 06/14/2017   HDL 26 (L) 06/14/2017   LDLCALC 129 (H) 06/14/2017   ALT 52 06/16/2017   AST 34 06/16/2017   NA 138 06/17/2017   K 3.9 06/17/2017   CL 104 06/17/2017   CREATININE 1.09 06/17/2017   BUN 10 06/17/2017   CO2 27 06/17/2017   INR 1.03 06/16/2017   HGBA1C 5.2 06/16/2017    Grace Isaac, MD 06/19/2017 7:20 AM

## 2017-06-19 NOTE — Anesthesia Procedure Notes (Signed)
Anesthesia Procedure Image    

## 2017-06-19 NOTE — Anesthesia Procedure Notes (Addendum)
Procedure Name: Intubation Date/Time: 06/19/2017 7:52 AM Performed by: Carney Living, CRNA Pre-anesthesia Checklist: Patient identified, Emergency Drugs available, Suction available, Patient being monitored and Timeout performed Patient Re-evaluated:Patient Re-evaluated prior to induction Oxygen Delivery Method: Circle system utilized Preoxygenation: Pre-oxygenation with 100% oxygen Induction Type: IV induction Ventilation: Mask ventilation without difficulty and Oral airway inserted - appropriate to patient size Laryngoscope Size: Mac and 4 Grade View: Grade II Tube type: Oral Tube size: 8.0 mm Number of attempts: 1 Airway Equipment and Method: Stylet Placement Confirmation: ETT inserted through vocal cords under direct vision,  positive ETCO2 and breath sounds checked- equal and bilateral Secured at: 24 cm Tube secured with: Tape Dental Injury: Teeth and Oropharynx as per pre-operative assessment

## 2017-06-19 NOTE — Progress Notes (Signed)
No family here to take patient valuables.Patient clothing and cell phone in red bag at nurse's desk labelled with patient name.Patient has asked family friends Shanon Brow and Odetta Pink to pick up belongings this morning at nurse's desk.

## 2017-06-19 NOTE — Procedures (Signed)
Extubation Procedure Note  Patient Details:   Name: Anthony [name] DOB: Apr 25, 1971 MRN: [id number]   Airway Documentation:  Airway 8 mm (Active)  Secured at (cm) 24 cm 06/19/2017 11:39 AM  Measured From Lips 06/19/2017 11:39 AM  Secured Location Right 06/19/2017 11:39 AM  Secured By Rana Snare Tape 06/19/2017 11:39 AM  Tube Holder Repositioned Yes 06/19/2017 11:39 AM  Site Condition Dry 06/19/2017 11:39 AM    Evaluation  O2 sats: stable throughout Complications: No apparent complications Patient did tolerate procedure well. Bilateral Breath Sounds: Clear   Yes   Patient extubated per protocol to 4L [name] with no apparent complications. Positive cuff leak was noted prior to extubation. Patient achieved NIF of -30 and VC of .80L. Patient is oriented to tome and place and is able to speak. Vitals are stable and sats are 96%. RT will continue to monitor.   Laveta Gilkey Clyda Greener 06/19/2017, 3:15 PM

## 2017-06-19 NOTE — Transfer of Care (Signed)
Immediate Anesthesia Transfer of Care Note  Patient: Avner Raatz  Procedure(s) Performed: CORONARY ARTERY BYPASS GRAFTING (CABG), OFF PUMP, times one using the left internal mammary artery to LAD (N/A Chest) TRANSESOPHAGEAL ECHOCARDIOGRAM (TEE) (N/A )  Patient Location: SICU  Anesthesia Type:General  Level of Consciousness: sedated and Patient remains intubated per anesthesia plan  Airway & Oxygen Therapy: Patient remains intubated per anesthesia plan and Patient placed on Ventilator (see vital sign flow sheet for setting)  Post-op Assessment: Report given to RN and Post -op Vital signs reviewed and stable  Post vital signs: Reviewed and stable  Last Vitals:  Vitals:   06/19/17 0541 06/19/17 1139  BP: 120/85 109/62  Pulse: (!) 50 70  Resp:  12  Temp: 36.7 C (!) 35.2 C  SpO2: 96% 100%    Last Pain:  Vitals:   06/19/17 0541  TempSrc: Oral  PainSc:       Patients Stated Pain Goal: 0 (58/94/83 4758)  Complications: No apparent anesthesia complications

## 2017-06-19 NOTE — Anesthesia Procedure Notes (Signed)
Arterial Line Insertion Start/End11/19/2018 6:50 AM, 06/19/2017 7:00 AM Performed by: Carney Living, CRNA  Preanesthetic checklist: patient identified, IV checked, risks and benefits discussed, monitors and equipment checked and pre-op evaluation Patient sedated radial was placed Catheter size: 20 G Hand hygiene performed  and maximum sterile barriers used   Attempts: 2 Procedure performed without using ultrasound guided technique. Following insertion, dressing applied and Biopatch. Post procedure assessment: decreased circulation, normal, numbness and unchanged  Patient tolerated the procedure well with no immediate complications.

## 2017-06-19 NOTE — Anesthesia Procedure Notes (Signed)
Central Venous Catheter Insertion Performed by: Myrtie Soman, MD, anesthesiologist Start/End11/19/2018 6:49 AM, 06/19/2017 7:10 AM Patient location: Pre-op. Preanesthetic checklist: patient identified, IV checked, site marked, risks and benefits discussed, surgical consent, monitors and equipment checked, pre-op evaluation, timeout performed and anesthesia consent Position: Trendelenburg Lidocaine 1% used for infiltration Hand hygiene performed  and maximum sterile barriers used  Catheter size: 8.5 Fr PA cath was placed.Sheath introducer Swan type:thermodilution PA Cath depth:46 Procedure performed without using ultrasound guided technique. Ultrasound Notes:anatomy identified, needle tip was noted to be adjacent to the nerve/plexus identified, no ultrasound evidence of intravascular and/or intraneural injection and image(s) printed for medical record Attempts: 1 Following insertion, line sutured, dressing applied and Biopatch. Post procedure assessment: blood return through all ports and free fluid flow  Patient tolerated the procedure well with no immediate complications.

## 2017-06-19 NOTE — Progress Notes (Signed)
  Echocardiogram Echocardiogram Transesophageal has been performed.  Jennette Dubin 06/19/2017, 10:22 AM

## 2017-06-20 ENCOUNTER — Encounter (HOSPITAL_COMMUNITY): Payer: Self-pay | Admitting: Cardiothoracic Surgery

## 2017-06-20 ENCOUNTER — Inpatient Hospital Stay (HOSPITAL_COMMUNITY): Payer: BLUE CROSS/BLUE SHIELD

## 2017-06-20 LAB — BASIC METABOLIC PANEL
ANION GAP: 7 (ref 5–15)
BUN: 12 mg/dL (ref 6–20)
CHLORIDE: 103 mmol/L (ref 101–111)
CO2: 24 mmol/L (ref 22–32)
CREATININE: 1.02 mg/dL (ref 0.61–1.24)
Calcium: 8.6 mg/dL — ABNORMAL LOW (ref 8.9–10.3)
GFR calc non Af Amer: >60 mL/min (ref >60)
Glucose, Bld: 102 mg/dL — ABNORMAL HIGH (ref 65–99)
POTASSIUM: 4.5 mmol/L (ref 3.5–5.1)
SODIUM: 134 mmol/L — AB (ref 135–145)

## 2017-06-20 LAB — GLUCOSE, CAPILLARY
GLUCOSE-CAPILLARY: 77 mg/dL (ref 65–99)
Glucose-Capillary: 100 mg/dL — ABNORMAL HIGH (ref 65–99)
Glucose-Capillary: 110 mg/dL — ABNORMAL HIGH (ref 65–99)
Glucose-Capillary: 73 mg/dL (ref 65–99)
Glucose-Capillary: 94 mg/dL (ref 65–99)
Glucose-Capillary: 98 mg/dL (ref 65–99)

## 2017-06-20 LAB — CBC
HCT: 38.6 % — ABNORMAL LOW (ref 39.0–52.0)
HEMATOCRIT: 38.1 % — AB (ref 39.0–52.0)
HEMOGLOBIN: 12.9 g/dL — AB (ref 13.0–17.0)
HEMOGLOBIN: 13.4 g/dL (ref 13.0–17.0)
MCH: 30.1 pg (ref 26.0–34.0)
MCH: 31 pg (ref 26.0–34.0)
MCHC: 33.9 g/dL (ref 30.0–36.0)
MCHC: 34.7 g/dL (ref 30.0–36.0)
MCV: 88.8 fL (ref 78.0–100.0)
MCV: 89.4 fL (ref 78.0–100.0)
Platelets: 236 10*3/uL (ref 150–400)
Platelets: 228 10*3/uL (ref 150–400)
RBC: 4.29 MIL/uL (ref 4.22–5.81)
RBC: 4.32 MIL/uL (ref 4.22–5.81)
RDW: 13.6 % (ref 11.5–15.5)
RDW: 13.5 % (ref 11.5–15.5)
WBC: 13 10*3/uL — AB (ref 4.0–10.5)
WBC: 13.2 10*3/uL — AB (ref 4.0–10.5)

## 2017-06-20 LAB — POCT I-STAT, CHEM 8
BUN: 15 mg/dL (ref 6–20)
CALCIUM ION: 1.27 mmol/L (ref 1.15–1.40)
CHLORIDE: 98 mmol/L — AB (ref 101–111)
CREATININE: 0.9 mg/dL (ref 0.61–1.24)
Glucose, Bld: 129 mg/dL — ABNORMAL HIGH (ref 65–99)
HCT: 41 % (ref 39.0–52.0)
Hemoglobin: 13.9 g/dL (ref 13.0–17.0)
Potassium: 4 mmol/L (ref 3.5–5.1)
Sodium: 136 mmol/L (ref 135–145)
TCO2: 24 mmol/L (ref 22–32)

## 2017-06-20 LAB — CREATININE, SERUM: CREATININE: 0.98 mg/dL (ref 0.61–1.24)

## 2017-06-20 LAB — MAGNESIUM
MAGNESIUM: 2.2 mg/dL (ref 1.7–2.4)
MAGNESIUM: 2 mg/dL (ref 1.7–2.4)

## 2017-06-20 MED ORDER — CLOPIDOGREL BISULFATE 75 MG PO TABS
75.0000 mg | ORAL_TABLET | Freq: Every day | ORAL | Status: DC
  Administered 2017-06-20 – 2017-06-23 (×4): 75 mg via ORAL
  Filled 2017-06-20 (×4): qty 1

## 2017-06-20 MED ORDER — ENOXAPARIN SODIUM 30 MG/0.3ML ~~LOC~~ SOLN
30.0000 mg | Freq: Every day | SUBCUTANEOUS | Status: DC
  Administered 2017-06-20: 30 mg via SUBCUTANEOUS
  Filled 2017-06-20: qty 0.3

## 2017-06-20 MED ORDER — FUROSEMIDE 40 MG PO TABS: 40.0000 mg | ORAL_TABLET | Freq: Every day | ORAL | Status: DC

## 2017-06-20 MED ORDER — INSULIN ASPART 100 UNIT/ML ~~LOC~~ SOLN: 0.0000 [IU] | SUBCUTANEOUS | Status: DC

## 2017-06-20 MED ORDER — MAGIC MOUTHWASH
5.0000 mL | Freq: Three times a day (TID) | ORAL | Status: DC | PRN
  Administered 2017-06-20 – 2017-06-22 (×3): 5 mL via ORAL
  Filled 2017-06-20 (×4): qty 5

## 2017-06-20 MED FILL — Sodium Chloride IV Soln 0.9%: INTRAVENOUS | Qty: 250 | Status: AC

## 2017-06-20 MED FILL — Heparin Sodium (Porcine) Inj 1000 Unit/ML: INTRAMUSCULAR | Qty: 30 | Status: AC

## 2017-06-20 MED FILL — Magnesium Sulfate Inj 50%: INTRAMUSCULAR | Qty: 10 | Status: AC

## 2017-06-20 MED FILL — Phenylephrine HCl Inj 10 MG/ML: INTRAMUSCULAR | Qty: 1 | Status: AC

## 2017-06-20 MED FILL — Sodium Chloride IV Soln 0.9%: INTRAVENOUS | Qty: 1000 | Status: AC

## 2017-06-20 MED FILL — Potassium Chloride Inj 2 mEq/ML: INTRAVENOUS | Qty: 20 | Status: AC

## 2017-06-20 MED FILL — Dexmedetomidine HCl in NaCl 0.9% IV Soln 400 MCG/100ML: INTRAVENOUS | Qty: 100 | Status: AC

## 2017-06-20 NOTE — Op Note (Signed)
NAME:  Anthony [name], Anthony [name]                   ACCOUNT NO.:  [id number]  MEDICAL RECORD NO.:  [id number]  LOCATION:  C20C                         FACILITY:  [facility]  PHYSICIAN:  Lanelle Bal, MD    DATE OF BIRTH:  [birth year]  DATE OF PROCEDURE:  06/19/2017   OPERATIVE REPORT   PREOPERATIVE DIAGNOSIS:  Critical proximal LAD stenosis.  POSTOPERATIVE DIAGNOSIS:  Critical proximal LAD stenosis.  SURGICAL PROCEDURE:  Off-pump coronary artery bypass grafting x1 with left internal mammary to the left anterior descending coronary artery.  SURGEON:  Lanelle Bal, MD.  FIRST ASSISTANT:  Lars Pinks, PA.  BRIEF HISTORY:  The patient is a 46 year old male who presented with prolonged chest pain with positive troponins and underwent cardiac catheterization by Dr. Daneen [name].  At the time of catheterization, he had luminal irregularities in the right coronary artery with a complex long high-grade greater than 90% stenosis of the LAD.  A small circumflex branch in the AV groove was totally occluded.  Overall ventricular function was preserved.  Because of the complexity of the proximal LAD lesion, stenting was not thought to be anatomically appropriate and the patient was referred for consideration of coronary artery bypass grafting.  Risks and options were discussed with the patient.  He was agreeable with proceeding with coronary artery bypass grafting.  DESCRIPTION OF PROCEDURE:  With Swan-Ganz and arterial line monitors in place, the patient underwent general endotracheal anesthesia without incident.  Skin of the chest and legs was prepped with Betadine and draped in usual sterile manner.  Appropriate time-out was performed.  We then proceeded with a sternotomy and the left internal mammary artery was dissected down as a pedicle graft.  The distal artery was divided and had excellent free flow.  The vessel was hydrostatically dilated with heparinized saline.  Pericardium was opened.   Overall ventricular pump function appeared preserved as noted on TEE.  We then proceeded with slightly elevating the heart.  The LAD was easily visible and was decided that off-pump bypass was technically feasible.  The patient was heparinized.  Initially, a suction cup device of the apex of the heart was used to elevate the heart to get good visualization of the LAD in the midportion of the LAD.  Elastic tapes were placed around the vessel proximally and distally to the site of anastomosis.  The mammary artery was trimmed to the appropriate length.  A forked stabilization device was then used to stabilize the site of anastomosis.  The vessel was opened.  The elastic tapes provided a blood free anastomotic site. Using Mister blower, we had good visualization and sewed the left internal mammary to the left anterior descending coronary artery with near completion of anastomosis.  Elastic tapes were removed.  Pedicle of the mammary artery was tacked to the epicardium.  There was a good Doppler signal in the mammary artery.  Protamine sulfate was administered.  Atrial and ventricular pacing wires were applied.  The patient remained hemodynamically stable.  During the procedure, a left Blake mediastinal drain was left in place.  Pericardium was loosely reapproximated.  Sternum was closed with #6 stainless steel wire. Fascia was closed with interrupted 0 Vicryl, running 3-0 Vicryl in subcutaneous tissue, 3-0 subcuticular stitch in skin edges.  Dry dressings were applied. Sponge and needle count  was reported as correct at completion of procedure.  The patient tolerated the procedure without obvious complication.  The estimated blood loss was less than 250 mL.  The patient was transferred to the Surgical Intensive Care Unit for further postoperative care.     Lanelle Bal, MD     EG/MEDQ  D:  06/20/2017  T:  06/20/2017  Job:  [id number]

## 2017-06-20 NOTE — Anesthesia Postprocedure Evaluation (Signed)
Anesthesia Post Note  Patient: Anthony [name]  Procedure(s) Performed: CORONARY ARTERY BYPASS GRAFTING (CABG), OFF PUMP, times one using the left internal mammary artery to LAD (N/A Chest) TRANSESOPHAGEAL ECHOCARDIOGRAM (TEE) (N/A )     Patient location during evaluation: SICU Anesthesia Type: General Level of consciousness: awake and alert Pain management: pain level controlled Vital Signs Assessment: post-procedure vital signs reviewed and stable Respiratory status: spontaneous breathing Cardiovascular status: stable Postop Assessment: no apparent nausea or vomiting Anesthetic complications: no Comments: Pt comfortable and doing well. Complains of mildly sore throat when drinking, "feels like my acid reflux". Surgeon ordered mouthwash for comfort. No current vasopressor needs.     Last Vitals:  Vitals:   06/20/17 1800 06/20/17 1900  BP: 120/80 137/77  Pulse: 86 96  Resp: 14 (!) 22  Temp:    SpO2: (!) 88% (!) 87%    Last Pain:  Vitals:   06/20/17 1757  TempSrc:   PainSc: 3                  Effie Berkshire

## 2017-06-20 NOTE — Progress Notes (Addendum)
TCTS DAILY ICU PROGRESS NOTE                   [address]            [place] [address]          [phone]   1 Day Post-Op Procedure(s) (LRB): CORONARY ARTERY BYPASS GRAFTING (CABG), OFF PUMP, times one using the left internal mammary artery to LAD (N/A) TRANSESOPHAGEAL ECHOCARDIOGRAM (TEE) (N/A)  Total Length of Stay:  LOS: 6 days   Subjective:  Doing okay.  States he had a rough night last night.  He states that when he would drink water he would have severe pain along his left chest area.  I explained to the patient this was likely esophageal spasm.  He denies N/V, but hasn't attempted to eat breakfast this morning as he doesn't feel hungry.  Objective: Vital signs in last 24 hours: Temp:  [95.2 F (35.1 C)-99.5 F (37.5 C)] 98.6 F (37 C) (11/20 0800) Pulse Rate:  [70-87] 80 (11/20 0800) Cardiac Rhythm: Normal sinus rhythm (11/20 0800) Resp:  [9-27] 16 (11/20 0800) BP: (100-137)/(62-115) 119/79 (11/20 0800) SpO2:  [90 %-100 %] 95 % (11/20 0800) Arterial Line BP: (85-132)/(53-77) 109/60 (11/20 0800) FiO2 (%):  [36 %-50 %] 36 % (11/19 1514) Weight:  [224 lb 13.9 oz (102 kg)] 224 lb 13.9 oz (102 kg) (11/20 0500)  Filed Weights   06/17/17 0500 06/19/17 0541 06/20/17 0500  Weight: 214 lb 14.4 oz (97.5 kg) 212 lb 4.9 oz (96.3 kg) 224 lb 13.9 oz (102 kg)    Weight change: 12 lb 9.1 oz (5.7 kg)   Hemodynamic parameters for last 24 hours: PAP: (16-47)/(7-32) 41/18 CO:  [2.2 L/min-6.8 L/min] 4.5 L/min CI:  [2.1 L/min/m2-3.2 L/min/m2] 2.1 L/min/m2  Intake/Output from previous day: 11/19 0701 - 11/20 0700 In: 4933.5 [I.V.:4503.5; IV Piggyback:430] Out: 4081 [Urine:1190; Blood:100; Chest Tube:344]  Intake/Output this shift: Total I/O In: 40 [I.V.:40] Out: -   Current Meds: Scheduled Meds: . acetaminophen  1,000 mg Oral Q6H   Or  . acetaminophen (TYLENOL) oral liquid 160 mg/5 mL  1,000 mg Per Tube Q6H  . aspirin EC  325 mg Oral Daily   Or  .  aspirin  324 mg Per Tube Daily  . atorvastatin  80 mg Oral q1800  . bisacodyl  10 mg Oral Daily   Or  . bisacodyl  10 mg Rectal Daily  . clopidogrel  75 mg Oral Daily  . docusate sodium  200 mg Oral Daily  . enoxaparin (LOVENOX) injection  30 mg Subcutaneous QHS  . insulin aspart  0-24 Units Subcutaneous Q4H  . insulin aspart  0-24 Units Subcutaneous Q4H  . metoprolol tartrate  12.5 mg Oral BID   Or  . metoprolol tartrate  12.5 mg Per Tube BID  . [START ON 06/21/2017] pantoprazole  40 mg Oral Daily  . sodium chloride flush  3 mL Intravenous Q12H   Continuous Infusions: . sodium chloride    . sodium chloride    . sodium chloride 20 mL/hr (06/19/17 1216)  . albumin human    . cefUROXime (ZINACEF)  IV 1.5 g (06/20/17 0755)  . dexmedetomidine (PRECEDEX) IV infusion Stopped (06/20/17 0650)  . lactated ringers    . lactated ringers 20 mL/hr at 06/20/17 0000  . lactated ringers 20 mL/hr at 06/19/17 1400  . nitroGLYCERIN 0 mcg/min (06/19/17 1217)  . phenylephrine (NEO-SYNEPHRINE) Adult infusion 0 mcg/min (06/19/17 1200)   PRN  Meds:.sodium chloride, albumin human, lactated ringers, loratadine, metoprolol tartrate, midazolam, morphine injection, ondansetron (ZOFRAN) IV, oxyCODONE, sodium chloride flush, traMADol  General appearance: alert, cooperative and no distress Heart: regular rate and rhythm Lungs: clear to auscultation bilaterally Abdomen: soft, non-tender; bowel sounds normal; no masses,  no organomegaly Extremities: edema trace Wound: clean and dry  Lab Results: CBC: Recent Labs    06/19/17 1749 06/19/17 1756 06/20/17 0446  WBC 14.3*  --  13.0*  HGB 13.1 12.6* 12.9*  HCT 37.6* 37.0* 38.1*  PLT 223  --  236   BMET:  Recent Labs    06/19/17 1756 06/20/17 0446  NA 139 134*  K 4.8 4.5  CL 106 103  CO2  --  24  GLUCOSE 121* 102*  BUN 13 12  CREATININE 0.90 1.02  CALCIUM  --  8.6*    CMET: Lab Results  Component Value Date   WBC 13.0 (H) 06/20/2017   HGB  12.9 (L) 06/20/2017   HCT 38.1 (L) 06/20/2017   PLT 236 06/20/2017   GLUCOSE 102 (H) 06/20/2017   CHOL 187 06/14/2017   TRIG 160 (H) 06/14/2017   HDL 26 (L) 06/14/2017   LDLCALC 129 (H) 06/14/2017   ALT 52 06/16/2017   AST 34 06/16/2017   NA 134 (L) 06/20/2017   K 4.5 06/20/2017   CL 103 06/20/2017   CREATININE 1.02 06/20/2017   BUN 12 06/20/2017   CO2 24 06/20/2017   INR 1.19 06/19/2017   HGBA1C 5.2 06/16/2017      PT/INR:  Recent Labs    06/19/17 1136  LABPROT 15.0  INR 1.19   Radiology: Dg Chest Port 1 View  Result Date: 06/20/2017 CLINICAL DATA:  46 year old male with critical lad stenosis status post CABG. Postoperative day 1. EXAM: PORTABLE CHEST 1 VIEW COMPARISON:  06/19/2017 and earlier. FINDINGS: Portable AP semi upright view at 0527 hours. Extubated. Enteric tube removed. Stable right IJ Swan-Ganz catheter. Stable left chest tube and mediastinal drain. Lower lung volumes. Stable cardiac size and mediastinal contours. No pneumothorax. The pulmonary vasculature is more indistinct compared the yesterday. No definite pleural effusion. Negative visible bowel gas pattern. IMPRESSION: 1. Extubated and enteric tube removed.  Lower lung volumes. 2.  Otherwise stable lines and tubes. 3. No pneumothorax. Questionable increased pulmonary vascular congestion. Electronically Signed   By: Genevie Ann M.D.   On: 06/20/2017 09:04   Dg Chest Port 1 View  Result Date: 06/19/2017 CLINICAL DATA:  Coronary artery disease.  Status post CABG. EXAM: PORTABLE CHEST 1 VIEW COMPARISON:  06/13/2017 FINDINGS: Endotracheal tube, NG tube and Swan-Ganz catheter and left chest tube all appear in good position. No pneumothorax. There is a 21 mm nodular appearing density just lateral to the right hilum. The lungs are otherwise clear. No effusions. IMPRESSION: 1. 21 mm nodular density just lateral to the right hilum could represent a pulmonary nodule. 2. Otherwise, satisfactory postoperative appearance of the  chest with no pneumothorax. Electronically Signed   By: Lorriane Shire M.D.   On: 06/19/2017 12:15     Assessment/Plan: S/P Procedure(s) (LRB): CORONARY ARTERY BYPASS GRAFTING (CABG), OFF PUMP, times one using the left internal mammary artery to LAD (N/A) TRANSESOPHAGEAL ECHOCARDIOGRAM (TEE) (N/A)  1. CV- NSR, off all drips- start low dose Lopressor 2. Pulm- wean oxygen as tolerated, CXR without pneumo, mild increase in vascular congestion, no significant effusions... Will d/c chest tubes today, continue IS 3. Renal- creatinine stable, mild elevation in weight, will start low dose Lasix tomorrow  for 3 doses 4. Expected post operative anemia, mild  5. CBGS- controlled, patient not a diabetic, transition off insulin drip 6. Dispo- patient stable, off all drips, start low dose lasix for 3 days, POD #1 progression orders     Ellwood Handler 06/20/2017 9:15 AM   Will  Need follow up ct of chest , pa and lateral no lung mass noted No air in neck or mediastinum to suggest esophageal injury I have seen and examined Anthony [name] and agree with the above assessment  and plan.  [place] Isaac MD Beeper [phone] Office [phone] 06/20/2017 10:12 AM

## 2017-06-20 NOTE — Progress Notes (Signed)
Patient ID: Anthony [name], male   DOB: Oct 27, 1970, 46 y.o.   MRN: [id number] EVENING ROUNDS NOTE :     [address]       [place] [address]             [phone]                 1 Day Post-Op Procedure(s) (LRB): CORONARY ARTERY BYPASS GRAFTING (CABG), OFF PUMP, times one using the left internal mammary artery to LAD (N/A) TRANSESOPHAGEAL ECHOCARDIOGRAM (TEE) (N/A)  Total Length of Stay:  LOS: 6 days  BP 138/83   Pulse 85   Temp 98.6 F (37 C) (Oral)   Resp 18   Ht 5\' 10"  (1.778 m)   Wt 224 lb 13.9 oz (102 kg)   SpO2 91%   BMI 32.27 kg/m   .Intake/Output      11/19 0701 - 11/20 0700 11/20 0701 - 11/21 0700   P.O.  720   I.V. (mL/kg) 4503.5 (44.2) 240 (2.4)   IV Piggyback 430    Total Intake(mL/kg) 4933.5 (48.4) 960 (9.4)   Urine (mL/kg/hr) 1190 (0.5) 50 (0)   Blood 100    Chest Tube 344 10   Total Output 1634 60   Net +3299.5 +900          . sodium chloride    . sodium chloride    . sodium chloride 20 mL/hr (06/19/17 1216)  . cefUROXime (ZINACEF)  IV Stopped (06/20/17 1001)  . lactated ringers    . lactated ringers 20 mL/hr at 06/20/17 0000  . lactated ringers 20 mL/hr at 06/19/17 1400     Lab Results  Component Value Date   WBC 13.2 (H) 06/20/2017   HGB 13.9 06/20/2017   HCT 41.0 06/20/2017   PLT 228 06/20/2017   GLUCOSE 129 (H) 06/20/2017   CHOL 187 06/14/2017   TRIG 160 (H) 06/14/2017   HDL 26 (L) 06/14/2017   LDLCALC 129 (H) 06/14/2017   ALT 52 06/16/2017   AST 34 06/16/2017   NA 136 06/20/2017   K 4.0 06/20/2017   CL 98 (L) 06/20/2017   CREATININE 0.90 06/20/2017   BUN 15 06/20/2017   CO2 24 06/20/2017   INR 1.19 06/19/2017   HGBA1C 5.2 06/16/2017   Stable day, some ulceration on uvula-we will start Magic mouthwash for symptomatic relief Transfer to stepdown tomorrow  [place] Isaac MD  Beeper [phone] Office [phone] 06/20/2017 6:40 PM

## 2017-06-21 ENCOUNTER — Inpatient Hospital Stay (HOSPITAL_COMMUNITY): Payer: BLUE CROSS/BLUE SHIELD

## 2017-06-21 DIAGNOSIS — I25709 Atherosclerosis of coronary artery bypass graft(s), unspecified, with unspecified angina pectoris: Secondary | ICD-10-CM

## 2017-06-21 LAB — CBC
HCT: 36.1 % — ABNORMAL LOW (ref 39.0–52.0)
Hemoglobin: 12.2 g/dL — ABNORMAL LOW (ref 13.0–17.0)
MCH: 30 pg (ref 26.0–34.0)
MCHC: 33.8 g/dL (ref 30.0–36.0)
MCV: 88.9 fL (ref 78.0–100.0)
Platelets: 232 10*3/uL (ref 150–400)
RBC: 4.06 MIL/uL — ABNORMAL LOW (ref 4.22–5.81)
RDW: 13.4 % (ref 11.5–15.5)
WBC: 13.7 10*3/uL — ABNORMAL HIGH (ref 4.0–10.5)

## 2017-06-21 LAB — BASIC METABOLIC PANEL
Anion gap: 6 (ref 5–15)
BUN: 14 mg/dL (ref 6–20)
CO2: 27 mmol/L (ref 22–32)
Calcium: 8.7 mg/dL — ABNORMAL LOW (ref 8.9–10.3)
Chloride: 99 mmol/L — ABNORMAL LOW (ref 101–111)
Creatinine, Ser: 0.96 mg/dL (ref 0.61–1.24)
GFR calc Af Amer: >60 mL/min (ref >60)
GFR calc non Af Amer: >60 mL/min (ref >60)
Glucose, Bld: 109 mg/dL — ABNORMAL HIGH (ref 65–99)
Potassium: 4.1 mmol/L (ref 3.5–5.1)
Sodium: 132 mmol/L — ABNORMAL LOW (ref 135–145)

## 2017-06-21 LAB — GLUCOSE, CAPILLARY
GLUCOSE-CAPILLARY: 107 mg/dL — AB (ref 65–99)
GLUCOSE-CAPILLARY: 106 mg/dL — AB (ref 65–99)
Glucose-Capillary: 100 mg/dL — ABNORMAL HIGH (ref 65–99)
Glucose-Capillary: 100 mg/dL — ABNORMAL HIGH (ref 65–99)
Glucose-Capillary: 81 mg/dL (ref 65–99)

## 2017-06-21 MED ORDER — LISINOPRIL 5 MG PO TABS
5.0000 mg | ORAL_TABLET | Freq: Every day | ORAL | Status: DC
  Administered 2017-06-21 – 2017-06-23 (×3): 5 mg via ORAL
  Filled 2017-06-21 (×3): qty 1

## 2017-06-21 MED ORDER — POTASSIUM CHLORIDE CRYS ER 20 MEQ PO TBCR
20.0000 meq | EXTENDED_RELEASE_TABLET | Freq: Every day | ORAL | Status: DC
  Administered 2017-06-21 – 2017-06-23 (×3): 20 meq via ORAL
  Filled 2017-06-21 (×3): qty 1

## 2017-06-21 MED ORDER — FUROSEMIDE 10 MG/ML IJ SOLN
40.0000 mg | Freq: Once | INTRAMUSCULAR | Status: AC
  Administered 2017-06-21: 40 mg via INTRAVENOUS
  Filled 2017-06-21: qty 4

## 2017-06-21 MED ORDER — ASPIRIN EC 81 MG PO TBEC
81.0000 mg | DELAYED_RELEASE_TABLET | Freq: Every day | ORAL | Status: DC
  Administered 2017-06-21 – 2017-06-23 (×3): 81 mg via ORAL
  Filled 2017-06-21 (×3): qty 1

## 2017-06-21 NOTE — Progress Notes (Addendum)
TCTS DAILY ICU PROGRESS NOTE                   [address]            [place] [address]          [phone]   2 Days Post-Op Procedure(s) (LRB): CORONARY ARTERY BYPASS GRAFTING (CABG), OFF PUMP, times one using the left internal mammary artery to LAD (N/A) TRANSESOPHAGEAL ECHOCARDIOGRAM (TEE) (N/A)  Total Length of Stay:  LOS: 7 days   Subjective:  Doing better today.  He is eating and drinking without difficulty.  He states the magic mouthwash helped with his throat.  Nursing states he complained of burning with urination.    Objective: Vital signs in last 24 hours: Temp:  [97.4 F (36.3 C)-99.6 F (37.6 C)] 99.3 F (37.4 C) (11/21 0327) Pulse Rate:  [77-96] 81 (11/21 0400) Cardiac Rhythm: Normal sinus rhythm (11/21 0400) Resp:  [12-31] 12 (11/21 0700) BP: (106-149)/(67-90) 138/90 (11/21 0400) SpO2:  [87 %-100 %] 94 % (11/21 0400) Arterial Line BP: (109)/(60) 109/60 (11/20 0800) Weight:  [225 lb (102.1 kg)] 225 lb (102.1 kg) (11/21 0600)  Filed Weights   06/19/17 0541 06/20/17 0500 06/21/17 0600  Weight: 212 lb 4.9 oz (96.3 kg) 224 lb 13.9 oz (102 kg) 225 lb (102.1 kg)    Weight change: 2.1 oz (0.059 kg)   Hemodynamic parameters for last 24 hours: PAP: (41)/(18) 41/18 CO:  [4.5 L/min] 4.5 L/min CI:  [2.1 L/min/m2] 2.1 L/min/m2  Intake/Output from previous day: 11/20 0701 - 11/21 0700 In: 2140 [P.O.:1680; I.V.:460] Out: 395 [Urine:385; Chest Tube:10]  Intake/Output this shift: No intake/output data recorded.  Current Meds: Scheduled Meds: . acetaminophen  1,000 mg Oral Q6H   Or  . acetaminophen (TYLENOL) oral liquid 160 mg/5 mL  1,000 mg Per Tube Q6H  . aspirin EC  325 mg Oral Daily   Or  . aspirin  324 mg Per Tube Daily  . atorvastatin  80 mg Oral q1800  . bisacodyl  10 mg Oral Daily   Or  . bisacodyl  10 mg Rectal Daily  . clopidogrel  75 mg Oral Daily  . docusate sodium  200 mg Oral Daily  . enoxaparin (LOVENOX) injection  30 mg  Subcutaneous QHS  . furosemide  40 mg Intravenous Once  . insulin aspart  0-24 Units Subcutaneous Q4H  . lisinopril  5 mg Oral Daily  . metoprolol tartrate  12.5 mg Oral BID   Or  . metoprolol tartrate  12.5 mg Per Tube BID  . pantoprazole  40 mg Oral Daily  . potassium chloride  20 mEq Oral Daily  . sodium chloride flush  3 mL Intravenous Q12H   Continuous Infusions: . sodium chloride    . sodium chloride    . sodium chloride 20 mL/hr (06/19/17 1216)  . cefUROXime (ZINACEF)  IV Stopped (06/20/17 2045)  . lactated ringers    . lactated ringers Stopped (06/21/17 0430)  . lactated ringers 20 mL/hr at 06/20/17 1900   PRN Meds:.sodium chloride, lactated ringers, loratadine, magic mouthwash, metoprolol tartrate, midazolam, morphine injection, ondansetron (ZOFRAN) IV, oxyCODONE, sodium chloride flush, traMADol  General appearance: alert, cooperative and no distress Heart: regular rate and rhythm Lungs: diminished breath sounds LLL Abdomen: soft, non-tender; bowel sounds normal; no masses,  no organomegaly Extremities: edema trace - 1+ Wound: clean and dry  Lab Results: CBC: Recent Labs    06/20/17 1718 06/20/17 1725 06/21/17 0319  WBC 13.2*  --  13.7*  HGB 13.4 13.9 12.2*  HCT 38.6* 41.0 36.1*  PLT 228  --  232   BMET:  Recent Labs    06/20/17 0446  06/20/17 1725 06/21/17 0319  NA 134*  --  136 132*  K 4.5  --  4.0 4.1  CL 103  --  98* 99*  CO2 24  --   --  27  GLUCOSE 102*  --  129* 109*  BUN 12  --  15 14  CREATININE 1.02   < > 0.90 0.96  CALCIUM 8.6*  --   --  8.7*   < > = values in this interval not displayed.    CMET: Lab Results  Component Value Date   WBC 13.7 (H) 06/21/2017   HGB 12.2 (L) 06/21/2017   HCT 36.1 (L) 06/21/2017   PLT 232 06/21/2017   GLUCOSE 109 (H) 06/21/2017   CHOL 187 06/14/2017   TRIG 160 (H) 06/14/2017   HDL 26 (L) 06/14/2017   LDLCALC 129 (H) 06/14/2017   ALT 52 06/16/2017   AST 34 06/16/2017   NA 132 (L) 06/21/2017   K 4.1  06/21/2017   CL 99 (L) 06/21/2017   CREATININE 0.96 06/21/2017   BUN 14 06/21/2017   CO2 27 06/21/2017   INR 1.19 06/19/2017   HGBA1C 5.2 06/16/2017      PT/INR:  Recent Labs    06/19/17 1136  LABPROT 15.0  INR 1.19   Radiology: No results found.   Assessment/Plan: S/P Procedure(s) (LRB): CORONARY ARTERY BYPASS GRAFTING (CABG), OFF PUMP, times one using the left internal mammary artery to LAD (N/A) TRANSESOPHAGEAL ECHOCARDIOGRAM (TEE) (N/A)  1. CV- NSR, BP in the 130-140s- will start low dose Lisinopril today, continue Lopressor at 12.5 mg BID, Plavix started for ACS, off pump cabg 2. Pulm- wean oxygen as tolerated, CXR with worsening pleural fluid on the left, encouraged use of IS 3. Renal- creatinine WNL, weight is up about 10 lbs from baseline, will give IV Lasix today, supplement potassium 4. Dysuria- likely due to irritation from foley removal, if persists will check UA 5. ID- low grade temp today, likely SIR/Atelectasis 6. CBGs controlled, patient not a diabetic will d/c SSIP 6. Dispo- patient stable, start ACE for BP, diureses, start Plavix, continue pulm toilet, transfer to Deputy 06/21/2017 7:38 AM   Feels better today, ambulated around the unit at 5am today I have seen and examined Anthony [name] and agree with the above assessment  and plan.  Grace Isaac MD Beeper [phone] Office [phone] 06/21/2017 8:48 AM

## 2017-06-21 NOTE — Discharge Summary (Signed)
Physician Discharge Summary  Patient ID: Anthony [name] MRN: [id number] DOB/AGE: 46-Jan-1972 46 y.o.  Admit date: 06/13/2017 Discharge date: 06/23/2017  Admission Diagnoses:  Patient Active Problem List   Diagnosis Date Noted  . Coronary artery disease 06/19/2017  . Hyperlipidemia with target LDL less than 70 06/15/2017  . Stage III chronic kidney disease ([place]) 06/15/2017  . NSTEMI (non-ST elevated myocardial infarction) ([place]) 06/14/2017   Discharge Diagnoses:   Patient Active Problem List   Diagnosis Date Noted  . S/P CABG x 1 06/21/2017  . Coronary artery disease 06/19/2017  . Hyperlipidemia with target LDL less than 70 06/15/2017  . Stage III chronic kidney disease ([place]) 06/15/2017  . NSTEMI (non-ST elevated myocardial infarction) ([place]) 06/14/2017   Discharged Condition: good  History of Present Illness:  Anthony [name] is a 46 yo white male with PMH of smokeless tobacco use.  He presented to [facility] ED with complaints of chest pain with radiation down both of his shoulders.  The pain developed while he was eating barbeque chicken for dinner.  The patient thought it was indigestion and forced himself to vomit, but despite this he continued to have chest pain.  He has experienced several episodes similar to this which again he attributed to indigestion. He denies shortness of breath and nausea, but did have diaphoresis with this.  EKG was obtained and was NSR with RBBB and possible infarct.  He was given ASA and NTG with relieve of symptoms.  Troponin levels was elevated and he was ruled in for NSTEMI.  He was placed on heparin and nitroglycerin drips and was admitted for further care.   Hospital Course:   He remained chest pain free during hospitalization.  He underwent cardiac catheterization on 06/14/2017 which showed ostial LAD disease and 100% disease of the circumflex vessel.  It was felt coronary bypass grafting should be performed and TCTS consult was obtained.  He was evaluated by  [name] who felt the patient would required bypass surgery.  The risks and benefits of the procedure were explained to the patient and he was agreeable to proceed.  He was taken to the operating room on 06/19/2017.  He underwent off pump coronary bypass grafting x 1 utilizing LIMA to LAD.  He tolerated the procedure without difficulty and was taken to the SICU in stable condition.  The patient was extubated the evening of surgery.  During his stay in the SICU the patient was off all drips.  His chest tubes and arterial lines were removed without difficulty on POD #1.  He was started on low dose BB therapy.  He developed small left pleural effusion and minor lower extremity edema.  He was started on lasix for this.  He developed hypertension and was started on Lisinopril.  He was maintaining NSR and felt stable for transfer to 06/21/2017.  He continued to make to progress.  His pacing wires were removed without difficulty.  He was ambulating independently.  He was tolerating a heart healthy diet.  He is felt medically stable for discharge home today.  He continued to make progress.  Follow up CXR has revealed a right upper lobe lung nodule.  He will require CT scan for further evaluation.  This will be arranged on an outpatient basis prior to his follow up with [name].  He continues to maintain NSR.  His pacing wires have been removed without difficulty.  He will be discharged on Plavix for ACS.  He is ambulating without difficulty.  He is tolerating a heart healthy diet.  He is medically stable for discharge home today.  Significant Diagnostic Studies: angiography:   1.  Severe long segment proximal LAD stenosis from the ostium of the LAD into the mid vessel spanning the origin of a large first diagonal branch 2.  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  Mild nonobstructive irregularity of the  left main and mild diffuse nonobstructive disease of the right coronary artery 4.  Mild segmental contraction abnormality of the left ventricle with preserved overall LV systolic function  Films reviewed with colleagues.  Considering the length of the patient's proximal LAD stenosis and involvement of a large first diagonal branch, favor CABG over PCI.  Discussed plan with patient who is in agreement.  Cardiac surgical consultation will be called.  We will resume heparin in 2 hours.  We will continue IV nitroglycerin.  Hopefully the patient can be treated with arterial conduit considering his young age.  Treatments: surgery:   Off-pump coronary artery bypass grafting x1 with left internal mammary to the left anterior descending coronary artery.  Disposition: Home  Discharge Medications:  The patient has been discharged on:   1.Beta Blocker:  Yes [ x  ]                              No   [   ]                              If No, reason:  2.Ace Inhibitor/ARB: Yes [ x  ]                                     No  [    ]                                     If No, reason:  3.Statin:   Yes [ x  ]                  No  [   ]                  If No, reason:  4.Ecasa:  Yes  [ x  ]                  No   [   ]                  If No, reason:      Allergies as of 06/23/2017   No Known Allergies     Medication List    TAKE these medications   acetaminophen 500 MG tablet Commonly known as:  TYLENOL Take 2 tablets (1,000 mg total) by mouth every 6 (six) hours as needed.   aspirin 81 MG EC tablet Take 1 tablet (81 mg total) by mouth daily.   atorvastatin 80 MG tablet Commonly known as:  LIPITOR Take 1 tablet (80 mg total) by mouth daily at 6 PM.   clopidogrel 75 MG tablet Commonly known as:  PLAVIX Take 1 tablet (75 mg total) by  mouth daily.   esomeprazole 20 MG capsule Commonly known as:  NEXIUM Take 20 mg daily at 12 noon by mouth.   lisinopril 5 MG tablet Commonly known  as:  PRINIVIL,ZESTRIL Take 1 tablet (5 mg total) by mouth daily.   loratadine 10 MG tablet Commonly known as:  CLARITIN Take 10 mg daily as needed by mouth for allergies.   metoprolol tartrate 25 MG tablet Commonly known as:  LOPRESSOR Take 0.5 tablets (12.5 mg total) by mouth 2 (two) times daily.   traMADol 50 MG tablet Commonly known as:  ULTRAM Take 1-2 tablets (50-100 mg total) by mouth every 4 (four) hours as needed for moderate pain.      Follow-up Information    [name], MD Follow up on 07/11/2017.   Specialty:  Internal Medicine Why:  2:30 for hospital follow up and to establish PCP. Contact information: [address] [place] [address] [phone]        Grace Isaac, MD Follow up on 08/03/2017.   Specialty:  Cardiothoracic Surgery Why:  Appointment is at 12:00, please get CXR at 11:30 at [facility] located on first floor of our office buidling  Contact information: [place] [address] [phone]        Isaiah Serge, NP. Go on 07/17/2017.   Specialties:  Cardiology, Radiology Why:  @9am  post hospital follow up Contact information: [address] [place] [address] [phone]           Signed: Ellwood Handler 06/23/2017, 7:57 AM

## 2017-06-22 ENCOUNTER — Inpatient Hospital Stay (HOSPITAL_COMMUNITY): Payer: BLUE CROSS/BLUE SHIELD

## 2017-06-22 LAB — CBC
HCT: 34.4 % — ABNORMAL LOW (ref 39.0–52.0)
Hemoglobin: 11.7 g/dL — ABNORMAL LOW (ref 13.0–17.0)
MCH: 30.2 pg (ref 26.0–34.0)
MCHC: 34 g/dL (ref 30.0–36.0)
MCV: 88.9 fL (ref 78.0–100.0)
PLATELETS: 217 10*3/uL (ref 150–400)
RBC: 3.87 MIL/uL — AB (ref 4.22–5.81)
RDW: 13.4 % (ref 11.5–15.5)
WBC: 10.7 10*3/uL — ABNORMAL HIGH (ref 4.0–10.5)

## 2017-06-22 LAB — BASIC METABOLIC PANEL
Anion gap: 8 (ref 5–15)
BUN: 9 mg/dL (ref 6–20)
CALCIUM: 8.7 mg/dL — AB (ref 8.9–10.3)
CO2: 29 mmol/L (ref 22–32)
CREATININE: 1 mg/dL (ref 0.61–1.24)
Chloride: 98 mmol/L — ABNORMAL LOW (ref 101–111)
GFR calc Af Amer: >60 mL/min (ref >60)
GLUCOSE: 103 mg/dL — AB (ref 65–99)
POTASSIUM: 3.7 mmol/L (ref 3.5–5.1)
SODIUM: 135 mmol/L (ref 135–145)

## 2017-06-22 LAB — GLUCOSE, CAPILLARY
GLUCOSE-CAPILLARY: 96 mg/dL (ref 65–99)
GLUCOSE-CAPILLARY: 99 mg/dL (ref 65–99)
Glucose-Capillary: 96 mg/dL (ref 65–99)
Glucose-Capillary: 96 mg/dL (ref 65–99)
Glucose-Capillary: 109 mg/dL — ABNORMAL HIGH (ref 65–99)

## 2017-06-22 NOTE — Progress Notes (Signed)
Suture was found in pts neck from ij, site was clean/dry/intact, suture was removed no drainage. Pt tolerated well. Cleaned and left OTA. Will ctm

## 2017-06-22 NOTE — Progress Notes (Signed)
Pts pacer wires were removed at 11:15, pt tolerated the procedure well, vitals were taken and stable throughout. Pt understands 1 hr bed rest. Will ctm

## 2017-06-22 NOTE — Progress Notes (Addendum)
[address]       [place] [address]             [phone]      3 Days Post-Op Procedure(s) (LRB): CORONARY ARTERY BYPASS GRAFTING (CABG), OFF PUMP, times one using the left internal mammary artery to LAD (N/A) TRANSESOPHAGEAL ECHOCARDIOGRAM (TEE) (N/A) Subjective: Feels pretty well  Objective: Vital signs in last 24 hours: Temp:  [98.4 F (36.9 C)-99.4 F (37.4 C)] 98.8 F (37.1 C) (11/22 0804) Pulse Rate:  [80-88] 86 (11/22 0804) Cardiac Rhythm: Normal sinus rhythm (11/22 0048) Resp:  [12-18] 16 (11/22 0804) BP: (110-128)/(75-85) 114/75 (11/22 0804) SpO2:  [91 %-97 %] 96 % (11/22 0804) Weight:  [220 lb (99.8 kg)] 220 lb (99.8 kg) (11/22 0421)  Hemodynamic parameters for last 24 hours:    Intake/Output from previous day: 11/21 0701 - 11/22 0700 In: 3 [I.V.:3] Out: 1685 [Urine:1685] Intake/Output this shift: Total I/O In: -  Out: 275 [Urine:275]  General appearance: alert, cooperative and no distress Heart: regular rate and rhythm Lungs: dim in bases Abdomen: benign Extremities: min edema Wound: dressing CDI  Lab Results: Recent Labs    06/21/17 0319 06/22/17 0257  WBC 13.7* 10.7*  HGB 12.2* 11.7*  HCT 36.1* 34.4*  PLT 232 217   BMET:  Recent Labs    06/21/17 0319 06/22/17 0257  NA 132* 135  K 4.1 3.7  CL 99* 98*  CO2 27 29  GLUCOSE 109* 103*  BUN 14 9  CREATININE 0.96 1.00  CALCIUM 8.7* 8.7*    PT/INR:  Recent Labs    06/19/17 1136  LABPROT 15.0  INR 1.19   ABG    Component Value Date/Time   PHART 7.313 (L) 06/19/2017 1615   HCO3 22.1 06/19/2017 1615   TCO2 24 06/20/2017 1725   ACIDBASEDEF 4.0 (H) 06/19/2017 1615   O2SAT 98.0 06/19/2017 1615   CBG (last 3)  Recent Labs    06/22/17 0012 06/22/17 0417 06/22/17 0801  GLUCAP 99 96 96    Meds Scheduled Meds: . acetaminophen  1,000 mg Oral Q6H   Or  . acetaminophen (TYLENOL) oral liquid 160 mg/5 mL  1,000 mg Per Tube Q6H  . aspirin EC  81 mg Oral  Daily  . atorvastatin  80 mg Oral q1800  . bisacodyl  10 mg Oral Daily   Or  . bisacodyl  10 mg Rectal Daily  . clopidogrel  75 mg Oral Daily  . docusate sodium  200 mg Oral Daily  . lisinopril  5 mg Oral Daily  . metoprolol tartrate  12.5 mg Oral BID   Or  . metoprolol tartrate  12.5 mg Per Tube BID  . pantoprazole  40 mg Oral Daily  . potassium chloride  20 mEq Oral Daily  . sodium chloride flush  3 mL Intravenous Q12H   Continuous Infusions: . sodium chloride    . sodium chloride    . sodium chloride 20 mL/hr (06/19/17 1216)  . lactated ringers Stopped (06/21/17 0430)  . lactated ringers 20 mL/hr at 06/20/17 1900   PRN Meds:.sodium chloride, loratadine, magic mouthwash, metoprolol tartrate, morphine injection, ondansetron (ZOFRAN) IV, oxyCODONE, sodium chloride flush, traMADol  Xrays Dg Chest 2 View  Result Date: 06/22/2017 CLINICAL DATA:  Pleural effusion EXAM: CHEST  2 VIEW COMPARISON:  June 21, 2017 FINDINGS: Temporary pacemaker wires are attached to the right heart. No pneumothorax. There is a small left pleural effusion with atelectatic change in the  left base. There is a persistent nodular opacity in the right upper lobe, better seen on the frontal view, measuring 2.3 x 2.2 cm. Right lung otherwise clear. Heart is upper normal in size with pulmonary vascularity within normal limits. Patient is status post median sternotomy. No pneumothorax. IMPRESSION: 1. 2.3 x 2.2 cm nodular opacity right upper lobe. Advise noncontrast enhanced chest CT to further assess. 2.  Small left pleural effusion with left base atelectasis. 3.  Stable cardiac silhouette.  No evident pneumothorax. Electronically Signed   By: Lowella Grip III M.D.   On: 06/22/2017 07:58   Dg Chest Port 1 View  Result Date: 06/21/2017 CLINICAL DATA:  Status post coronary bypass grafting with chest pain EXAM: PORTABLE CHEST 1 VIEW COMPARISON:  06/20/2017, 06/19/2017, 06/13/2017 FINDINGS: Swan-Ganz catheter has  been removed. Mediastinal drain and left thoracostomy catheter have also been removed. No recurrent pneumothorax is seen. The overall inspiratory effort is poor. Nodular density is again identified in the right suprahilar region but was not well appreciated on the preoperative exam. Small left pleural effusion is noted. IMPRESSION: No recurrent pneumothorax noted. Small left pleural effusion. Nodular appearing density in the right upper lobe similar to that seen on 06/19/2017 but not well seen on the prior preoperative examination. Dedicated two view of the chest is recommended when the patient's condition improves. Electronically Signed   By: Inez Catalina M.D.   On: 06/21/2017 08:51    Assessment/Plan: S/P Procedure(s) (LRB): CORONARY ARTERY BYPASS GRAFTING (CABG), OFF PUMP, times one using the left internal mammary artery to LAD (N/A) TRANSESOPHAGEAL ECHOCARDIOGRAM (TEE) (N/A)  1 conts to do well 2 sinus rhythm, BP well controlled 3 leukocytosis resolved, H/H a little lower- monitor clinically, no fevers 4 renal fxn normal- no significant volume overload and spont diuresis is good, cont to monitor 5 radiology recs CT of chest for evaluating nodule- will need to derermine timing of study 6 sugars reasonably controlled , nutrition long term will be important with CVdisease at such a young age and some evidence of insulin resistance 7 push rehab/pulm toilet 8 d/c wires, poss home in am  LOS: 8 days    Anthony [name] 06/22/2017  I have seen and examined the patient and agree with the assessment and plan as outlined.  Rexene Alberts, MD 06/22/2017 1:16 PM

## 2017-06-23 MED ORDER — ATORVASTATIN CALCIUM 80 MG PO TABS: 80.0000 mg | ORAL_TABLET | Freq: Every day | ORAL | 3 refills | Status: DC

## 2017-06-23 MED ORDER — CLOPIDOGREL BISULFATE 75 MG PO TABS: 75.0000 mg | ORAL_TABLET | Freq: Every day | ORAL | 3 refills | Status: DC

## 2017-06-23 MED ORDER — LACTULOSE 10 GM/15ML PO SOLN
20.0000 g | Freq: Once | ORAL | Status: AC
  Administered 2017-06-23: 20 g via ORAL
  Filled 2017-06-23: qty 30

## 2017-06-23 MED ORDER — ASPIRIN 81 MG PO TBEC: 81.0000 mg | DELAYED_RELEASE_TABLET | Freq: Every day | ORAL | Status: AC

## 2017-06-23 MED ORDER — ACETAMINOPHEN 500 MG PO TABS: 1000.0000 mg | ORAL_TABLET | Freq: Four times a day (QID) | ORAL | 0 refills | Status: DC | PRN

## 2017-06-23 MED ORDER — METOPROLOL TARTRATE 25 MG PO TABS: 12.5000 mg | ORAL_TABLET | Freq: Two times a day (BID) | ORAL | 3 refills | Status: DC

## 2017-06-23 MED ORDER — LISINOPRIL 5 MG PO TABS: 5.0000 mg | ORAL_TABLET | Freq: Every day | ORAL | 3 refills | Status: DC

## 2017-06-23 MED ORDER — TRAMADOL HCL 50 MG PO TABS: 50.0000 mg | ORAL_TABLET | ORAL | 0 refills | Status: DC | PRN

## 2017-06-23 NOTE — Care Management Note (Signed)
Case Management Note Original Note Created Zenon Mayo, RN 06/15/2017, 10:53 AM   Patient Details  Name: Anthony [name] MRN: [id number] Date of Birth: 06-Apr-1971  Subjective/Objective:     From home with kids age 46 and 15.  PTA indep, presents with NSTEMI, plan for CABG.  he has medication coverage but no PCP- NCM scheduled follow up with Dr. Fara Olden at [facility] 12/11 at 2:30 .  His best friend [name] will assist him after his surgery at home [phone].                 Action/Plan: NCM will follow for dc needs.   Expected Discharge Date:  06/23/17               Expected Discharge Plan:  Home/Self Care  In-House Referral:  NA  Discharge planning Services  CM Consult  Post Acute Care Choice:  NA Choice offered to:  NA  DME Arranged:    DME Agency:     HH Arranged:    HH Agency:     Status of Service:  Completed, signed off  If discussed at [employer] of Stay Meetings, dates discussed:    Discharge Disposition: home/self care   Additional Comments:  06/23/17- 1030- Kavi Almquist RN, CM- pt for d/c home today- no CM needs noted for discharge.   Dahlia Client [place], RN 06/23/2017, 10:40 AM [phone]

## 2017-06-23 NOTE — Progress Notes (Addendum)
      [address]       [place] [address]             [phone]      4 Days Post-Op Procedure(s) (LRB): CORONARY ARTERY BYPASS GRAFTING (CABG), OFF PUMP, times one using the left internal mammary artery to LAD (N/A) TRANSESOPHAGEAL ECHOCARDIOGRAM (TEE) (N/A)   Subjective:  Anthony [name] has no new complaints.  He continues to have issues with sweating.  He is ready to be discharged home today.  + ambulation  No BM, but is passing gas  Objective: Vital signs in last 24 hours: Temp:  [98.8 F (37.1 C)-99.1 F (37.3 C)] 99 F (37.2 C) (11/23 0547) Pulse Rate:  [72-89] 77 (11/23 0547) Cardiac Rhythm: Normal sinus rhythm (11/22 2200) Resp:  [16-21] 18 (11/23 0547) BP: (113-133)/(70-85) 117/72 (11/23 0547) SpO2:  [94 %-96 %] 95 % (11/23 0547) Weight:  [216 lb 4.8 oz (98.1 kg)] 216 lb 4.8 oz (98.1 kg) (11/23 0547)  Hemodynamic parameters for last 24 hours:    Intake/Output from previous day: 11/22 0701 - 11/23 0700 In: 720 [P.O.:720] Out: 1975 [WVPXT:0626] Intake/Output this shift: No intake/output data recorded.  General appearance: alert, cooperative and no distress Heart: regular rate and rhythm Lungs: clear to auscultation bilaterally Abdomen: soft, non-tender; bowel sounds normal; no masses,  no organomegaly Extremities: edema trace Wound: clean and dry  Lab Results: Recent Labs    06/21/17 0319 06/22/17 0257  WBC 13.7* 10.7*  HGB 12.2* 11.7*  HCT 36.1* 34.4*  PLT 232 217   BMET:  Recent Labs    06/21/17 0319 06/22/17 0257  NA 132* 135  K 4.1 3.7  CL 99* 98*  CO2 27 29  GLUCOSE 109* 103*  BUN 14 9  CREATININE 0.96 1.00  CALCIUM 8.7* 8.7*    PT/INR: No results for input(s): LABPROT, INR in the last 72 hours. ABG    Component Value Date/Time   PHART 7.313 (L) 06/19/2017 1615   HCO3 22.1 06/19/2017 1615   TCO2 24 06/20/2017 1725   ACIDBASEDEF 4.0 (H) 06/19/2017 1615   O2SAT 98.0 06/19/2017 1615   CBG (last 3)  Recent Labs   06/22/17 0801 06/22/17 1123 06/22/17 1604  GLUCAP 96 109* 96    Assessment/Plan: S/P Procedure(s) (LRB): CORONARY ARTERY BYPASS GRAFTING (CABG), OFF PUMP, times one using the left internal mammary artery to LAD (N/A) TRANSESOPHAGEAL ECHOCARDIOGRAM (TEE) (N/A)  1. CV- NSR, BP controlled- continue Lopressor, Lisinopril 2. Pulm- no acute issues, continue IS 3. Renal- creatinine stable, weight is trending down, not on lasix 4. GI- constipation, will order lactulose 5. Dispo- patient stable, will d/c home today,  CXR with pulmonary nodule, will arrange outpatient CT scan prior to follow up appointment [name]   LOS: 9 days    Anthony [name] 06/23/2017

## 2017-06-23 NOTE — Discharge Instructions (Signed)
1. Please contact [facility] if you do not hear from then within 1 week to set up CT scan for evaluation of lung nodule   Coronary Artery Bypass Grafting, Care After This sheet gives you information about how to care for yourself after your procedure. Your health care provider may also give you more specific instructions. If you have problems or questions, contact your health care provider. What can I expect after the procedure? After the procedure, it is common to have:  Nausea and a lack of appetite.  Constipation.  Weakness and fatigue.  Depression or irritability.  Pain or discomfort in your incision areas.  Follow these instructions at home: Medicines  Take over-the-counter and prescription medicines only as told by your health care provider. Do not stop taking medicines or start any new medicines without approval from your health care provider.  If you were prescribed an antibiotic medicine, take it as told by your health care provider. Do not stop taking the antibiotic even if you start to feel better.  Do not drive or use heavy machinery while taking prescription pain medicine. Incision care  Follow instructions from your health care provider about how to take care of your incisions. Make sure you: ? Wash your hands with soap and water before you change your bandage (dressing). If soap and water are not available, use hand sanitizer. ? Change your dressing as told by your health care provider. ? Leave stitches (sutures), skin glue, or adhesive strips in place. These skin closures may need to stay in place for 2 weeks or longer. If adhesive strip edges start to loosen and curl up, you may trim the loose edges. Do not remove adhesive strips completely unless your health care provider tells you to do that.  Keep incision areas clean, dry, and protected.  Check your incision areas every day for signs of infection. Check for: ? More redness, swelling, or pain. ? More  fluid or blood. ? Warmth. ? Pus or a bad smell.  If incisions were made in your legs: ? Avoid crossing your legs. ? Avoid sitting for long periods of time. Change positions every 30 minutes. ? Raise (elevate) your legs when you are sitting. Bathing  Do not take baths, swim, or use a hot tub until your health care provider approves.  Only take sponge baths. Pat the incisions dry. Do not rub incisions with a washcloth or towel.  Ask your health care provider when you can shower. Eating and drinking  Eat foods that are high in fiber, such as raw fruits and vegetables, whole grains, beans, and nuts. Meats should be lean cut. Avoid canned, processed, and fried foods. This can help prevent constipation and is a recommended part of a heart-healthy diet.  Drink enough fluid to keep your urine clear or pale yellow.  Limit alcohol intake to no more than 1 drink a day for nonpregnant women and 2 drinks a day for men. One drink equals 12 oz of beer, 5 oz of wine, or 1 oz of hard liquor. Activity  Rest and limit your activity as told by your health care provider. You may be instructed to: ? Stop any activity right away if you have chest pain, shortness of breath, irregular heartbeats, or dizziness. Get help right away if you have any of these symptoms. ? Move around frequently for short periods or take short walks as directed by your health care provider. Gradually increase your activities. You may need physical therapy or cardiac  rehabilitation to help strengthen your muscles and build your endurance. ? Avoid lifting, pushing, or pulling anything that is heavier than 10 lb (4.5 kg) for at least 6 weeks or as told by your health care provider.  Do not drive until your health care provider approves.  Ask your health care provider when you may return to work.  Ask your health care provider when you may resume sexual activity. General instructions  Do not use any products that contain nicotine  or tobacco, such as cigarettes and e-cigarettes. If you need help quitting, ask your health care provider.  Take 2-3 deep breaths every few hours during the day, while you recover. This helps expand your lungs and prevent complications like pneumonia after surgery.  If you were given a device called an incentive spirometer, use it several times a day to practice deep breathing. Support your chest with a pillow or your arms when you take deep breaths or cough.  Wear compression stockings as told by your health care provider. These stockings help to prevent blood clots and reduce swelling in your legs.  Weigh yourself every day. This helps identify if your body is holding (retaining) fluid that may make your heart and lungs work harder.  Keep all follow-up visits as told by your health care provider. This is important. Contact a health care provider if:  You have more redness, swelling, or pain around any incision.  You have more fluid or blood coming from any incision.  Any incision feels warm to the touch.  You have pus or a bad smell coming from any incision  You have a fever.  You have swelling in your ankles or legs.  You have pain in your legs.  You gain 2 lb (0.9 kg) or more a day.  You are nauseous or you vomit.  You have diarrhea. Get help right away if:  You have chest pain that spreads to your jaw or arms.  You are short of breath.  You have a fast or irregular heartbeat.  You notice a "clicking" in your breastbone (sternum) when you move.  You have numbness or weakness in your arms or legs.  You feel dizzy or light-headed. Summary  After the procedure, it is common to have pain or discomfort in the incision areas.  Do not take baths, swim, or use a hot tub until your health care provider approves.  Gradually increase your activities. You may need physical therapy or cardiac rehabilitation to help strengthen your muscles and build your endurance.  Weigh  yourself every day. This helps identify if your body is holding (retaining) fluid that may make your heart and lungs work harder. This information is not intended to replace advice given to you by your health care provider. Make sure you discuss any questions you have with your health care provider. Document Released: 02/04/2005 Document Revised: 06/06/2016 Document Reviewed: 06/06/2016 Elsevier Interactive Patient Education  [employer].

## 2017-06-23 NOTE — Progress Notes (Signed)
Pt ambulating independently, no RW needed. Ed completed with good reception. Ready to quit chewing tobacco. Will refer to [facility].  Gave [facility] d/c video to watch. [place] CES, ACSM 9:00 AM 06/23/2017

## 2017-06-26 ENCOUNTER — Telehealth (HOSPITAL_COMMUNITY): Payer: Self-pay

## 2017-06-26 NOTE — Telephone Encounter (Signed)
Patients insurance is active and benefits verified through [employer] - No co-pay, deductible amount of $500.00/$500.00 has been met, out of pocket amount of $1,200/$0.00 has been met, 20% co-insurance, and no pre-authorization is required. Passport/reference [id number]  Patient will be contacted and scheduled after their follow up appt with the Cardiologist office upon review by the RN Navigator.

## 2017-07-14 NOTE — Progress Notes (Signed)
Cardiology Office Note   Date:  07/17/2017   ID:  Anthony [name], DOB 1971/04/24, MRN [id number]  PCP:  Patient, No Pcp Per  Cardiologist:  [name]     Chief Complaint  Patient presents with  . Hospitalization Follow-up      History of Present Illness: Anthony [name] is a 46 y.o. male who presents for post hospital for CABG X1, HLD and NSTEMI.  Pt presented 06/14/17 with chest pain asa and NTG with relief of pain. He also has acid reflux and chews tobacco.  Cardiac cath revealed severe long segment proximal LAD stenosis, chronic total occl of LCX with Rt tyo LT and Lt to Lt  preserved LV function.  [name] felt CABG was favored over PCI.  Pt seen by [name] and plan for CABG X 1 off pump.  Pt underwent this 06/19/17 with LIMA to LAD.   He did have a lung nodule and is for CT of his chest .  He was discharged. 06/21/17  Today he has no complaints, some soreness of chest incision.  Is walking 1.5 miles twice a day and feels well.  If he becomes tired he slows and rests.  He has good appetite.  No fevers or colds. He does plan to go to rehab.  For his lung nodule he has CT ordered for tomorrow.    Past Medical History:  Diagnosis Date  . Acid reflux   . NSTEMI (non-ST elevated myocardial infarction) ([place]) 06/13/2017   [name] 06/14/2017  . Tailbone injury since 1993   "cracked"    Past Surgical History:  Procedure Laterality Date  . CARDIAC CATHETERIZATION  06/14/2017  . CORONARY ARTERY BYPASS GRAFT N/A 06/19/2017   Procedure: CORONARY ARTERY BYPASS GRAFTING (CABG), OFF PUMP, times one using the left internal mammary artery to LAD;  Surgeon: Grace Isaac, MD;  Location: [facility];  Service: Open Heart Surgery;  Laterality: N/A;  . LEFT HEART CATH AND CORONARY ANGIOGRAPHY N/A 06/14/2017   Procedure: LEFT HEART CATH AND CORONARY ANGIOGRAPHY;  Surgeon: Sherren Mocha, MD;  Location: [facility];  Service: Cardiovascular;  Laterality: N/A;  . TEE WITHOUT CARDIOVERSION N/A  06/19/2017   Procedure: TRANSESOPHAGEAL ECHOCARDIOGRAM (TEE);  Surgeon: Grace Isaac, MD;  Location: [facility];  Service: Open Heart Surgery;  Laterality: N/A;  . TONSILLECTOMY  ~ 1977     Current Outpatient Medications  Medication Sig Dispense Refill  . acetaminophen (TYLENOL) 500 MG tablet Take 2 tablets (1,000 mg total) by mouth every 6 (six) hours as needed. 30 tablet 0  . aspirin EC 81 MG EC tablet Take 1 tablet (81 mg total) by mouth daily.    Marland Kitchen atorvastatin (LIPITOR) 80 MG tablet Take 1 tablet (80 mg total) by mouth daily at 6 PM. 30 tablet 3  . clopidogrel (PLAVIX) 75 MG tablet Take 1 tablet (75 mg total) by mouth daily. 30 tablet 3  . esomeprazole (NEXIUM) 20 MG capsule Take 20 mg daily at 12 noon by mouth.    Marland Kitchen lisinopril (PRINIVIL,ZESTRIL) 5 MG tablet Take 1 tablet (5 mg total) by mouth daily. 30 tablet 3  . loratadine (CLARITIN) 10 MG tablet Take 10 mg daily as needed by mouth for allergies.    . metoprolol tartrate (LOPRESSOR) 25 MG tablet Take 0.5 tablets (12.5 mg total) by mouth 2 (two) times daily. 30 tablet 3  . traMADol (ULTRAM) 50 MG tablet Take 1-2 tablets (50-100 mg total) by mouth every 4 (four) hours as  needed for moderate pain. 30 tablet 0   No current facility-administered medications for this visit.     Allergies:   Patient has no known allergies.    Social History:  The patient  reports that  has never smoked. His smokeless tobacco use includes snuff. He reports that he drinks alcohol. He reports that he does not use drugs.   Family History:  The patient's family history includes Alcohol abuse in his mother; Aneurysm in his maternal grandmother; Arrhythmia in his father; Heart disease in his father.    ROS:  General:no colds or fevers, + weight loss Skin:no rashes or ulcers HEENT:no blurred vision, no congestion CV:see HPI PUL:see HPI GI:no diarrhea constipation or melena, no indigestion GU:no hematuria, no dysuria MS:no joint pain, no  claudication Neuro:no syncope, no lightheadedness Endo:no diabetes, no thyroid disease  Wt Readings from Last 3 Encounters:  07/17/17 209 lb 12.8 oz (95.2 kg)  06/23/17 216 lb 4.8 oz (98.1 kg)     PHYSICAL EXAM: VS:  BP 106/72   Pulse 70   Resp 16   Ht 5' 8.5" (1.74 m)   Wt 209 lb 12.8 oz (95.2 kg)   SpO2 98%   BMI 31.44 kg/m  , BMI Body mass index is 31.44 kg/m. General:Pleasant affect, NAD Skin:Warm and dry, brisk capillary refill HEENT:normocephalic, sclera clear, mucus membranes moist Neck:supple, no JVD, no bruits  Heart:S1S2 RRR without murmur, gallup, rub or click, chest incision is healing no drainage. Lungs:clear without rales, rhonchi, or wheezes INO:MVEH, non tender, + BS, do not palpate liver spleen or masses Ext:no lower ext edema, 2+ pedal pulses, 2+ radial pulses Neuro:alert and oriented X 3, MAE, follows commands, + facial symmetry    EKG:  EKG is ordered today. The ekg ordered today demonstrates SR at 75 incomlete RBBB old inf MI and poor R wave progression similar to EKG 06/18/17   Recent Labs: 06/16/2017: ALT 52 06/20/2017: Magnesium 2.0 06/22/2017: BUN 9; Creatinine, Ser 1.00; Hemoglobin 11.7; Platelets 217; Potassium 3.7; Sodium 135    Lipid Panel    Component Value Date/Time   CHOL 187 06/14/2017 0754   TRIG 160 (H) 06/14/2017 0754   HDL 26 (L) 06/14/2017 0754   CHOLHDL 7.2 06/14/2017 0754   VLDL 32 06/14/2017 0754   LDLCALC 129 (H) 06/14/2017 0754       Other studies Reviewed: Additional studies/ records that were reviewed today include: .  Cardiac cath 06/14/17 Procedures   LEFT HEART CATH AND CORONARY ANGIOGRAPHY  Conclusion   1.  Severe long segment proximal LAD stenosis from the ostium of the LAD into the mid vessel spanning the origin of a large first diagonal branch 2.  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  Mild  nonobstructive irregularity of the left main and mild diffuse nonobstructive disease of the right coronary artery 4.  Mild segmental contraction abnormality of the left ventricle with preserved overall LV systolic function  Films reviewed with colleagues.  Considering the length of the patient's proximal LAD stenosis and involvement of a large first diagonal branch, favor CABG over PCI.  Discussed plan with patient who is in agreement.  Cardiac surgical consultation will be called.  We will resume heparin in 2 hours.  We will continue IV nitroglycerin.  Hopefully the patient can be treated with arterial conduit considering his young age.  Indications   Non-ST elevation (NSTEMI)  myocardial infarction ([place]) [I21.4 (ICD-10-CM)]   ECHO 06/15/17 Study Conclusions  - Left ventricle: The cavity size was normal. Wall thickness was   normal. Systolic function was normal. The estimated ejection   fraction was in the range of 55% to 60%.  Dopplers   Final Interpretation: Right Carotid: There is evidence in the right ICA of a 1-39% stenosis.  Left Carotid: There is evidence in the left ICA of a 1-39% stenosis. Vertebrals: Both vertebral arteries were patent with antegrade flow. Subclavians:  Right ABI: Resting right ankle-brachial index is within normal range. No evidence of significant right lower extremity arterial disease. Left ABI: Resting left ankle-brachial index is within normal range. No evidence of significant left lower extremity arterial disease.  TEE 06/19/17 Echo Findings   Left Ventricle Normal cavity size, wall thickness and left ventricular diastolic function. LV systolic function is normal with an EF of 55-60%. There are no obvious wall motion abnormalities. No thrombus present. No mass present.  Septum Normal atrial and ventricular septum with no septal defect and normal septal motion. No Patent Foramen Ovale present.  Left Atrium Left atrium normal in structure and function.  No thrombus present. No mass present.  Aortic Valve The valve is trileaflet. Normal leaflet separation. No stenosis. No regurgitation.  Aorta No aneurysm present. Plaque present in the descending aorta is mild (Grade 2: Mild-focal or diffuse int. thick. of 2-85mm) and protruding. No graft present. No significant coarctation present. No aortic dissection present  Mitral Valve Normal valve structure. No leaflet thickening and calcification present. Normal transmitral gradient. No stenosis. Normal leaflet mobility. No regurgitation.  Right Ventricle Normal cavity size, wall thickness and ejection fraction.  Right Atrium Normal right atrial size.  Tricuspid Valve Normal tricuspid valve structure. No stenosis. No regurgitation.  Pulmonic Valve Normal pulmonic valve structure. No stenosis. No regurgitation.  Pericardium Normal pericardium with no pericardial effusion.   OP report 06/19/17  PROCEDURE: TRANSESOPHAGEAL ECHOCARDIOGRAM (TEE),MEDIAN STERNOTOMY for CORONARY ARTERY BYPASS GRAFTING (CABG) x 1 (LIMA to LAD), OFF PUMP, using the left internal mammary artery to LAD      ASSESSMENT AND PLAN:  1. NSTEMI with single vessel CAD at cath.  Follow up with Dr Alger Simons in 6-8 weeks. encouraged to go to cardiac rehab.   2.  CAD with CABG with LIMA to LAD   3. HLD on statin, continue and check lipids and hepatic in 6 weeks.  4.  Lung nodule for CT of chest tomorrow.     Current medicines are reviewed with the patient today.  The patient Has no concerns regarding medicines.  The following changes have been made:  See above Labs/ tests ordered today include:see above  Disposition:   FU:  see above  Signed, Cecilie Kicks, NP  07/17/2017 9:57 AM    [facility] [place], [place], [place], [place] Phone: [phone]; Fax: [phone]

## 2017-07-17 ENCOUNTER — Ambulatory Visit: Payer: BLUE CROSS/BLUE SHIELD | Admitting: Cardiology

## 2017-07-17 ENCOUNTER — Encounter: Payer: Self-pay | Admitting: Cardiology

## 2017-07-17 VITALS — BP 106/72 | HR 70 | Resp 16 | Ht 68.5 in | Wt 209.8 lb

## 2017-07-17 DIAGNOSIS — I2510 Atherosclerotic heart disease of native coronary artery without angina pectoris: Secondary | ICD-10-CM

## 2017-07-17 DIAGNOSIS — Z951 Presence of aortocoronary bypass graft: Secondary | ICD-10-CM

## 2017-07-17 DIAGNOSIS — E782 Mixed hyperlipidemia: Secondary | ICD-10-CM

## 2017-07-17 DIAGNOSIS — R911 Solitary pulmonary nodule: Secondary | ICD-10-CM

## 2017-07-17 DIAGNOSIS — N183 Chronic kidney disease, stage 3 unspecified: Secondary | ICD-10-CM

## 2017-07-17 DIAGNOSIS — I214 Non-ST elevation (NSTEMI) myocardial infarction: Secondary | ICD-10-CM | POA: Diagnosis not present

## 2017-07-17 LAB — CBC WITH DIFFERENTIAL/PLATELET
BASOS: 1 %
Basophils Absolute: 0.1 10*3/uL (ref 0.0–0.2)
EOS (ABSOLUTE): 0.2 10*3/uL (ref 0.0–0.4)
EOS: 3 %
HEMATOCRIT: 42.6 % (ref 37.5–51.0)
Hemoglobin: 14.7 g/dL (ref 13.0–17.7)
Immature Grans (Abs): 0 10*3/uL (ref 0.0–0.1)
Immature Granulocytes: 0 %
LYMPHS ABS: 2.1 10*3/uL (ref 0.7–3.1)
Lymphs: 25 %
MCH: 30.8 pg (ref 26.6–33.0)
MCHC: 34.5 g/dL (ref 31.5–35.7)
MCV: 89 fL (ref 79–97)
MONOS ABS: 0.6 10*3/uL (ref 0.1–0.9)
Monocytes: 8 %
Neutrophils Absolute: 5.4 10*3/uL (ref 1.4–7.0)
Neutrophils: 63 %
Platelets: 334 10*3/uL (ref 150–379)
RBC: 4.77 x10E6/uL (ref 4.14–5.80)
RDW: 13.6 % (ref 12.3–15.4)
WBC: 8.4 10*3/uL (ref 3.4–10.8)

## 2017-07-17 LAB — BASIC METABOLIC PANEL
BUN/Creatinine Ratio: 12 (ref 9–20)
BUN: 12 mg/dL (ref 6–24)
CALCIUM: 10.2 mg/dL (ref 8.7–10.2)
CO2: 25 mmol/L (ref 20–29)
Chloride: 101 mmol/L (ref 96–106)
Creatinine, Ser: 1.01 mg/dL (ref 0.76–1.27)
GFR, EST AFRICAN AMERICAN: 103 mL/min/{1.73_m2} (ref >59)
GFR, EST NON AFRICAN AMERICAN: 89 mL/min/{1.73_m2} (ref >59)
Glucose: 84 mg/dL (ref 65–99)
POTASSIUM: 4.8 mmol/L (ref 3.5–5.2)
SODIUM: 141 mmol/L (ref 134–144)

## 2017-07-17 NOTE — Patient Instructions (Addendum)
Your physician recommends that you continue on your current medications as directed. Please refer to the Current Medication list given to you today.  Your physician recommends that you return for lab work in:  [facility] recommends that you schedule a follow-up appointment in:  [place]

## 2017-07-18 ENCOUNTER — Ambulatory Visit
Admission: RE | Admit: 2017-07-18 | Discharge: 2017-07-18 | Disposition: A | Discharge status: Home / Self Care | Payer: BLUE CROSS/BLUE SHIELD | Source: Ambulatory Visit | Attending: Physician Assistant | Admitting: Physician Assistant

## 2017-07-18 DIAGNOSIS — R911 Solitary pulmonary nodule: Secondary | ICD-10-CM

## 2017-08-03 ENCOUNTER — Other Ambulatory Visit: Payer: Self-pay | Admitting: *Deleted

## 2017-08-03 ENCOUNTER — Other Ambulatory Visit: Payer: Self-pay

## 2017-08-03 ENCOUNTER — Ambulatory Visit
Admission: RE | Admit: 2017-08-03 | Discharge: 2017-08-03 | Disposition: A | Discharge status: Home / Self Care | Payer: BLUE CROSS/BLUE SHIELD | Source: Ambulatory Visit | Attending: Cardiothoracic Surgery | Admitting: Cardiothoracic Surgery

## 2017-08-03 ENCOUNTER — Other Ambulatory Visit: Payer: Self-pay | Admitting: Cardiothoracic Surgery

## 2017-08-03 ENCOUNTER — Ambulatory Visit (INDEPENDENT_AMBULATORY_CARE_PROVIDER_SITE_OTHER): Payer: Self-pay | Admitting: Cardiothoracic Surgery

## 2017-08-03 ENCOUNTER — Encounter: Payer: Self-pay | Admitting: Cardiothoracic Surgery

## 2017-08-03 VITALS — BP 113/76 | HR 86 | Resp 18 | Ht 68.5 in | Wt 205.4 lb

## 2017-08-03 DIAGNOSIS — Z951 Presence of aortocoronary bypass graft: Secondary | ICD-10-CM

## 2017-08-03 DIAGNOSIS — R911 Solitary pulmonary nodule: Secondary | ICD-10-CM

## 2017-08-03 DIAGNOSIS — R918 Other nonspecific abnormal finding of lung field: Secondary | ICD-10-CM

## 2017-08-03 NOTE — Progress Notes (Signed)
[address]       [place] [address]             [phone]      Ether [facility] [place] Medical Record #[id number] Date of Birth: 1970/12/30  Referring: Sherren Mocha, MD Primary Care: [name], MD Primary Cardiologist: No primary care provider on file.   Chief Complaint:   POST OP FOLLOW UP 06/19/2017 OPERATIVE REPORT PREOPERATIVE DIAGNOSIS:  Critical proximal LAD stenosis POSTOPERATIVE DIAGNOSIS:  Critical proximal LAD stenosis. SURGICAL PROCEDURE:  Off-pump coronary artery bypass grafting x1 with left internal mammary to the left anterior descending coronary artery. SURGEON:  Lanelle Bal, MD.   History of Present Illness:     Patient returns to the office in follow-up after recent coronary artery bypass grafting.  Incidentally on just prior to discharge home patient's PA and lateral film showed a right lung nodule follow-up CT scan has been performed.  This was not noted on the patient's preop PA and lateral chest x-ray or subsequent portable films.  The patient is a lifelong non-smoker.  Is made good progress following coronary artery bypass grafting without recurrent anginal symptoms.  He notes he is walking up to 2 miles a day     Past Medical History:  Diagnosis Date  . Acid reflux   . NSTEMI (non-ST elevated myocardial infarction) ([facility]) 06/13/2017   [name] 06/14/2017  . Tailbone injury since 1993   "cracked"     Social History   Tobacco Use  Smoking Status Never Smoker  Smokeless Tobacco Current User  . Types: Snuff    Social History   Substance and Sexual Activity  Alcohol Use Yes   Comment: 06/16/2017 "used to drink alot; pretty much stopped~ 01/2015; will have a couple drinks/month now"     No Known Allergies  Current Outpatient Medications  Medication Sig Dispense Refill  . acetaminophen (TYLENOL) 500 MG tablet Take 2 tablets (1,000 mg total) by mouth every 6 (six) hours as needed. 30 tablet 0  .  aspirin EC 81 MG EC tablet Take 1 tablet (81 mg total) by mouth daily.    Marland Kitchen atorvastatin (LIPITOR) 80 MG tablet Take 1 tablet (80 mg total) by mouth daily at 6 PM. 30 tablet 3  . clopidogrel (PLAVIX) 75 MG tablet Take 1 tablet (75 mg total) by mouth daily. 30 tablet 3  . esomeprazole (NEXIUM) 20 MG capsule Take 20 mg daily at 12 noon by mouth.    Marland Kitchen lisinopril (PRINIVIL,ZESTRIL) 5 MG tablet Take 1 tablet (5 mg total) by mouth daily. 30 tablet 3  . loratadine (CLARITIN) 10 MG tablet Take 10 mg daily as needed by mouth for allergies.    . metoprolol tartrate (LOPRESSOR) 25 MG tablet Take 0.5 tablets (12.5 mg total) by mouth 2 (two) times daily. 30 tablet 3   No current facility-administered medications for this visit.        Physical Exam: BP 113/76 (BP Location: Right Arm, Patient Position: Sitting, Cuff Size: Large)   Pulse 86   Resp 18   Ht 5' 8.5" (1.74 m)   Wt 205 lb 6.4 oz (93.2 kg)   SpO2 99% Comment: on RA  BMI 30.78 kg/m   General appearance: alert, cooperative and no distress Neurologic: intact Heart: regular rate and rhythm, S1, S2 normal, no murmur, click, rub or gallop Lungs: clear to auscultation bilaterally Abdomen: soft, non-tender; bowel sounds normal; no masses,  no organomegaly Extremities: extremities normal, atraumatic, no cyanosis  or edema Wound: Patient sternal incision is well-healed there is no palpable motion in the sternum   Diagnostic Studies & Laboratory data:     Recent Radiology Findings:   Dg Chest 2 View  Result Date: 08/03/2017 CLINICAL DATA:  CABG EXAM: CHEST  2 VIEW COMPARISON:  06/22/2017 FINDINGS: Prior CABG. Heart and mediastinal contours are within normal limits. No focal opacities or effusions. No acute bony abnormality. IMPRESSION: No active cardiopulmonary disease. Electronically Signed   By: Rolm Baptise M.D.   On: 08/03/2017 11:33      Recent Lab Findings: Lab Results  Component Value Date   WBC 8.4 07/17/2017   HGB 14.7  07/17/2017   HCT 42.6 07/17/2017   PLT 334 07/17/2017   GLUCOSE 84 07/17/2017   CHOL 187 06/14/2017   TRIG 160 (H) 06/14/2017   HDL 26 (L) 06/14/2017   LDLCALC 129 (H) 06/14/2017   ALT 52 06/16/2017   AST 34 06/16/2017   NA 141 07/17/2017   K 4.8 07/17/2017   CL 101 07/17/2017   CREATININE 1.01 07/17/2017   BUN 12 07/17/2017   CO2 25 07/17/2017   INR 1.19 06/19/2017   HGBA1C 5.2 06/16/2017   CLINICAL DATA:  Solitary pulmonary nodule, abnormal chest radiograph  EXAM: CT CHEST WITHOUT CONTRAST  TECHNIQUE: Multidetector CT imaging of the chest was performed following the standard protocol without IV contrast.  COMPARISON:  Multiple chest radiographs, most recently 06/22/2017  FINDINGS: Cardiovascular: Heart is top-normal in size. No pericardial effusion.  No evidence of thoracic aortic aneurysm.  Coronary atherosclerosis the LAD. Postsurgical changes related to prior CABG.  Mediastinum/Nodes: 10 mm short axis high right paratracheal node (series 3/image 28) with preservation of the normal fatty hilum.  No suspicious axillary lymphadenopathy.  Visualized thyroid is unremarkable.  Lungs/Pleura: 2.4 x 1.8 cm irregular nodule in the medial right upper lobe (series 4/ image 44), suspicious for primary bronchogenic neoplasm, with associated pleural retraction.  Lungs are otherwise clear.  No focal consolidation.  Trace left pleural effusion.  No pneumothorax.  Upper Abdomen: Possible 2.0 cm hypodense lesion in segment 4B of the liver (series 3/image image 92), indeterminate.  Musculoskeletal: Mild degenerative changes of the visualized thoracolumbar spine. Median sternotomy.  IMPRESSION: 2.4 x 1.8 cm irregular nodule in the medial right upper lobe, suspicious for primary bronchogenic neoplasm. Associated pleural retraction.  10 mm short axis high right paratracheal node, indeterminate.  Possible 2.0 cm hypodense lesion in segment 4B of the  liver, indeterminate/poorly evaluated. Consider MRI abdomen with/ without contrast for further characterization.  Trace left pleural effusion.   Electronically Signed   By: Julian Hy M.D.   On: 07/18/2017 16:35   Assessment / Plan:      Patient doing well following coronary artery bypass grafting, now for evaluation of incidentally discovered 2.4 x 1.8 cm right upper lobe lung mass.  There is also a possible 2 cm hypodense lesion in the liver but poorly evaluated.  We will plan to proceed first with a PET scan to evaluate the lung mass.  As noted the patient is a lifelong non-smoker.  Depending on the PET scan will decide on MRI of the liver.      Grace Isaac MD      [address] [place] [address] Office [phone]   Beeper [phone]  08/03/2017 12:55 PM

## 2017-08-17 ENCOUNTER — Ambulatory Visit (HOSPITAL_COMMUNITY): Payer: BLUE CROSS/BLUE SHIELD

## 2017-08-24 ENCOUNTER — Encounter: Payer: BLUE CROSS/BLUE SHIELD | Admitting: Cardiothoracic Surgery

## 2017-08-29 ENCOUNTER — Ambulatory Visit (HOSPITAL_COMMUNITY)
Admission: RE | Admit: 2017-08-29 | Discharge: 2017-08-29 | Disposition: A | Discharge status: Home / Self Care | Payer: BLUE CROSS/BLUE SHIELD | Source: Ambulatory Visit | Attending: Cardiothoracic Surgery | Admitting: Cardiothoracic Surgery

## 2017-08-29 DIAGNOSIS — K7689 Other specified diseases of liver: Secondary | ICD-10-CM | POA: Insufficient documentation

## 2017-08-29 DIAGNOSIS — R9389 Abnormal findings on diagnostic imaging of other specified body structures: Secondary | ICD-10-CM | POA: Diagnosis not present

## 2017-08-29 DIAGNOSIS — C7951 Secondary malignant neoplasm of bone: Secondary | ICD-10-CM | POA: Diagnosis not present

## 2017-08-29 DIAGNOSIS — R911 Solitary pulmonary nodule: Secondary | ICD-10-CM | POA: Diagnosis present

## 2017-08-29 LAB — GLUCOSE, CAPILLARY: Glucose-Capillary: 42 mg/dL — CL (ref 65–99)

## 2017-08-29 MED ORDER — FLUDEOXYGLUCOSE F - 18 (FDG) INJECTION: 11.7000 | Freq: Once | INTRAVENOUS | Status: DC | PRN

## 2017-08-29 NOTE — Progress Notes (Signed)
[address]       [place] [address]             [phone]                    [facility] Medical Record #[id number] Date of Birth: 1970-08-15  Referring: Sherren Mocha, MD Primary Care: [name], MD Primary Cardiologist: Dr Tamala Julian  Chief Complaint:    Chief Complaint  Patient presents with  . Lung Lesion    f/u after PET Scan review results    History of Present Illness:    Anthony [name] 47 y.o. male is seen in the office  For evaluation of right lung mass. PET scan done yesterday. Incidentally  prior to discharge home patient's PA and lateral film showed a right lung nodule,  follow-up CT scan was  performed.  This was not noted on the patient's preop PA and lateral chest x-ray or subsequent portable films.  The patient is a lifelong non-smoker.  Is made good progress following coronary artery bypass grafting without recurrent anginal symptoms.  He notes he is walking up to 2 miles a day      06/19/2017 OPERATIVE REPORT PREOPERATIVE DIAGNOSIS: Critical proximal LAD stenosis POSTOPERATIVE DIAGNOSIS: Critical proximal LAD stenosis. SURGICAL PROCEDURE: Off-pump coronary artery bypass grafting x1 with left internal mammary to the left anterior descending coronary artery. SURGEON: Lanelle Bal, MD.   Current Activity/ Functional Status:  Patient is independent with mobility/ambulation, transfers, ADL's, IADL's.   Zubrod Score: At the time of surgery this patient's most appropriate activity status/level should be described as: [x]     0    Normal activity, no symptoms []     1    Restricted in physical strenuous activity but ambulatory, able to do out light work []     2    Ambulatory and capable of self care, unable to do work activities, up and about               >50 % of waking hours                              []     3    Only limited self care, in bed greater than 50% of waking hours []     4    Completely  disabled, no self care, confined to bed or chair []     5    Moribund   Past Medical History:  Diagnosis Date  . Acid reflux   . NSTEMI (non-ST elevated myocardial infarction) ([place]) 06/13/2017   [name] 06/14/2017  . Tailbone injury since 1993   "cracked"    Past Surgical History:  Procedure Laterality Date  . CARDIAC CATHETERIZATION  06/14/2017  . CORONARY ARTERY BYPASS GRAFT N/A 06/19/2017   Procedure: CORONARY ARTERY BYPASS GRAFTING (CABG), OFF PUMP, times one using the left internal mammary artery to LAD;  Surgeon: Grace Isaac, MD;  Location: [facility];  Service: Open Heart Surgery;  Laterality: N/A;  . LEFT HEART CATH AND CORONARY ANGIOGRAPHY N/A 06/14/2017   Procedure: LEFT HEART CATH AND CORONARY ANGIOGRAPHY;  Surgeon: Sherren Mocha, MD;  Location: [facility];  Service: Cardiovascular;  Laterality: N/A;  . TEE WITHOUT CARDIOVERSION N/A 06/19/2017   Procedure: TRANSESOPHAGEAL ECHOCARDIOGRAM (TEE);  Surgeon: Grace Isaac, MD;  Location: [facility];  Service: Open Heart Surgery;  Laterality: N/A;  . TONSILLECTOMY  ~  1977    Family History  Problem Relation Age of Onset  . Alcohol abuse Mother   . Heart disease Father   . Arrhythmia Father   . Aneurysm Maternal Grandmother     Social History   Socioeconomic History  . Marital status: Divorced    Spouse name: Not on file  . Number of children: Not on file  . Years of education: Not on file  . Highest education level: Not on file  Social Needs  . Financial resource strain: Not on file  . Food insecurity - worry: Not on file  . Food insecurity - inability: Not on file  . Transportation needs - medical: Not on file  . Transportation needs - non-medical: Not on file  Occupational History  . Not on file  Tobacco Use  . Smoking status: Never Smoker  . Smokeless tobacco: Current User    Types: Snuff  Substance and Sexual Activity  . Alcohol use: Yes    Comment: 06/16/2017 "used to drink alot; pretty much  stopped~ 01/2015; will have a couple drinks/month now"  . Drug use: No    Comment: 06/14/2017 "nothing since 2000"  . Sexual activity: Yes  Other Topics Concern  . Not on file  Social History Narrative  . Not on file    Social History   Tobacco Use  Smoking Status Never Smoker  Smokeless Tobacco Current User  . Types: Snuff    Social History   Substance and Sexual Activity  Alcohol Use Yes   Comment: 06/16/2017 "used to drink alot; pretty much stopped~ 01/2015; will have a couple drinks/month now"     No Known Allergies  Current Outpatient Medications  Medication Sig Dispense Refill  . acetaminophen (TYLENOL) 500 MG tablet Take 2 tablets (1,000 mg total) by mouth every 6 (six) hours as needed. 30 tablet 0  . aspirin EC 81 MG EC tablet Take 1 tablet (81 mg total) by mouth daily.    Marland Kitchen atorvastatin (LIPITOR) 80 MG tablet Take 1 tablet (80 mg total) by mouth daily at 6 PM. 30 tablet 3  . clopidogrel (PLAVIX) 75 MG tablet Take 1 tablet (75 mg total) by mouth daily. 30 tablet 3  . esomeprazole (NEXIUM) 20 MG capsule Take 20 mg daily at 12 noon by mouth.    Marland Kitchen lisinopril (PRINIVIL,ZESTRIL) 5 MG tablet Take 1 tablet (5 mg total) by mouth daily. 30 tablet 3  . loratadine (CLARITIN) 10 MG tablet Take 10 mg daily as needed by mouth for allergies.    . metoprolol tartrate (LOPRESSOR) 25 MG tablet Take 0.5 tablets (12.5 mg total) by mouth 2 (two) times daily. 30 tablet 3   No current facility-administered medications for this visit.    Facility-Administered Medications Ordered in Other Visits  Medication Dose Route Frequency Provider Last Rate Last Dose  . fludeoxyglucose F - 18 (FDG) injection 01.0 millicurie  27.2 millicurie Intravenous Once PRN Genia Del, MD        Pertinent items are noted in HPI.   Review of Systems:  Review of Systems  Constitutional: Negative.   HENT: Negative.   Eyes: Negative.   Respiratory: Negative.   Cardiovascular: Negative for chest pain (Postop  incisional pain), palpitations, orthopnea, claudication, leg swelling and PND.  Gastrointestinal: Negative.   Genitourinary: Negative.   Musculoskeletal: Negative.   Skin: Negative.   Neurological: Negative.   Endo/Heme/Allergies: Negative.   Psychiatric/Behavioral: Negative.     Physical Exam: BP 128/86   Pulse 65  Resp 20   Ht 5' 8.5" (1.74 m)   Wt 205 lb (93 kg)   SpO2 96% Comment: RA  BMI 30.72 kg/m   PHYSICAL EXAMINATION: General appearance: alert, cooperative and no distress Head: Normocephalic, without obvious abnormality, atraumatic Neck: no adenopathy, no carotid bruit, no JVD, supple, symmetrical, trachea midline and thyroid not enlarged, symmetric, no tenderness/mass/nodules Lymph nodes: Cervical, supraclavicular, and axillary nodes normal. Resp: clear to auscultation bilaterally Back: symmetric, no curvature. ROM normal. No CVA tenderness. Cardio: regular rate and rhythm, S1, S2 normal, no murmur, click, rub or gallop GI: soft, non-tender; bowel sounds normal; no masses,  no organomegaly Extremities: extremities normal, atraumatic, no cyanosis or edema Neurologic: Grossly normal Patient sternal incision is healing well he has some keloid formation at the lower pole of the incision there is no obvious infection, the bone is stable  Diagnostic Studies & Laboratory data:     Recent Radiology Findings:   Dg Chest 2 View  Result Date: 08/03/2017 CLINICAL DATA:  CABG EXAM: CHEST  2 VIEW COMPARISON:  06/22/2017 FINDINGS: Prior CABG. Heart and mediastinal contours are within normal limits. No focal opacities or effusions. No acute bony abnormality. IMPRESSION: No active cardiopulmonary disease. Electronically Signed   By: Rolm Baptise M.D.   On: 08/03/2017 11:33   Nm Pet Image Initial (pi) Skull Base To Thigh  Result Date: 08/29/2017 CLINICAL DATA:  Initial treatment strategy for solitary pulmonary nodule. EXAM: NUCLEAR MEDICINE PET SKULL BASE TO THIGH TECHNIQUE: 11.7  mCi F-18 FDG was injected intravenously. Full-ring PET imaging was performed from the skull base to thigh after the radiotracer. CT data was obtained and used for attenuation correction and anatomic localization. FASTING BLOOD GLUCOSE:  Value: 42 mg/dl COMPARISON:  CT 07/18/2017 FINDINGS: NECK No hypermetabolic lymph nodes in the neck. CHEST In the medial aspect of the RIGHT upper lobe 2.4 cm nodule is intensely metabolic with SUV max equal 15. Small hypermetabolic RIGHT hilar lymph node is present. Hypermetabolic RIGHT paratracheal lymph node measures 12 mm short axis with SUV max equal 4.6. ABDOMEN/PELVIS Low-density lesion in the central LEFT hepatic lobe measures 2.2 cm (image 98, series 4) and has metabolic activity above background liver activity (SUV max equal 4.9). Normal adrenal glands. No hypermetabolic abdominopelvic lymph nodes. Kidneys normal. Diverticula of the sigmoid colon. Metabolic activity ascending colon is favored benign physiologic. No hypermetabolic abdominopelvic lymph nodes. SKELETON Lesion in the C4 vertebral body of the cervical spine has intense metabolic activity with SUV max equal 4.7 and correlates with a 1.2 cm lytic lesion within the vertebral body. Additional moderate metabolic activity within the proximal LEFT femur at the level of the inferior trochanter. (SUV max equal 4.8). No CT correlation IMPRESSION: 1. Hypermetabolic RIGHT upper lobe pulmonary nodule most consistent prior bronchogenic carcinoma. 2. Hypermetabolic RIGHT hilar lymph node and RIGHT paratracheal lymph node most consistent with nodal metastasis. 3. Single hypermetabolic lesion within the central liver most concerning for hepatic metastasis. 4. Evidence skeletal metastasis to the C4 vertebral body. Suspicion of metastatic lesion within the LEFT intertrochanteric femur. Electronically Signed   By: Suzy Bouchard M.D.   On: 08/29/2017 17:09    I have independently reviewed the above radiology studies  and  reviewed the findings with the patient.   Recent Lab Findings: Lab Results  Component Value Date   WBC 8.4 07/17/2017   HGB 14.7 07/17/2017   HCT 42.6 07/17/2017   PLT 334 07/17/2017   GLUCOSE 84 07/17/2017   CHOL 187 06/14/2017  TRIG 160 (H) 06/14/2017   HDL 26 (L) 06/14/2017   LDLCALC 129 (H) 06/14/2017   ALT 52 06/16/2017   AST 34 06/16/2017   NA 141 07/17/2017   K 4.8 07/17/2017   CL 101 07/17/2017   CREATININE 1.01 07/17/2017   BUN 12 07/17/2017   CO2 25 07/17/2017   INR 1.19 06/19/2017   HGBA1C 5.2 06/16/2017   PFT's  FEV1 3.29 81% No dlco done Interpretation: Although the FVC and FEV1 are within normal limits, the FEV1/FVC ratio is reduced. Following administration of bronchodilators, there is no significant response. 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  I reviewed this diagnosis with the patient and suggest that we proceed next with MRI of the liver and CT-guided needle biopsy of the suspected liver lesion.  The patient will hold his Plavix for 4-5 days prior to biopsy       I  spent 30 minutes counseling the patient face to face.   Grace Isaac MD      [address] [place] [address] Office [phone]   Beeper [phone]  08/30/2017 3:50 PM

## 2017-08-30 ENCOUNTER — Other Ambulatory Visit: Payer: Self-pay | Admitting: *Deleted

## 2017-08-30 ENCOUNTER — Ambulatory Visit (INDEPENDENT_AMBULATORY_CARE_PROVIDER_SITE_OTHER): Payer: Self-pay | Admitting: Cardiothoracic Surgery

## 2017-08-30 ENCOUNTER — Encounter: Payer: Self-pay | Admitting: Cardiothoracic Surgery

## 2017-08-30 VITALS — BP 128/86 | HR 65 | Resp 20 | Ht 68.5 in | Wt 205.0 lb

## 2017-08-30 DIAGNOSIS — Z951 Presence of aortocoronary bypass graft: Secondary | ICD-10-CM

## 2017-08-30 DIAGNOSIS — R918 Other nonspecific abnormal finding of lung field: Secondary | ICD-10-CM

## 2017-08-30 DIAGNOSIS — R1907 Generalized intra-abdominal and pelvic swelling, mass and lump: Secondary | ICD-10-CM

## 2017-08-31 ENCOUNTER — Other Ambulatory Visit: Payer: Self-pay | Admitting: *Deleted

## 2017-08-31 DIAGNOSIS — R160 Hepatomegaly, not elsewhere classified: Secondary | ICD-10-CM

## 2017-09-04 ENCOUNTER — Ambulatory Visit
Admission: RE | Admit: 2017-09-04 | Discharge: 2017-09-04 | Disposition: A | Discharge status: Home / Self Care | Payer: BLUE CROSS/BLUE SHIELD | Source: Ambulatory Visit | Attending: Cardiothoracic Surgery | Admitting: Cardiothoracic Surgery

## 2017-09-04 DIAGNOSIS — R1907 Generalized intra-abdominal and pelvic swelling, mass and lump: Secondary | ICD-10-CM

## 2017-09-04 MED ORDER — GADOBENATE DIMEGLUMINE 529 MG/ML IV SOLN
19.0000 mL | Freq: Once | INTRAVENOUS | Status: AC | PRN
  Administered 2017-09-04: 19 mL via INTRAVENOUS

## 2017-09-05 ENCOUNTER — Other Ambulatory Visit: Payer: Self-pay | Admitting: Radiology

## 2017-09-06 ENCOUNTER — Encounter (HOSPITAL_COMMUNITY): Payer: Self-pay

## 2017-09-06 ENCOUNTER — Ambulatory Visit (HOSPITAL_COMMUNITY)
Admission: RE | Admit: 2017-09-06 | Discharge: 2017-09-06 | Disposition: A | Discharge status: Home / Self Care | Payer: BLUE CROSS/BLUE SHIELD | Source: Ambulatory Visit | Attending: Cardiothoracic Surgery | Admitting: Cardiothoracic Surgery

## 2017-09-06 ENCOUNTER — Telehealth: Payer: Self-pay | Admitting: Internal Medicine

## 2017-09-06 DIAGNOSIS — C3490 Malignant neoplasm of unspecified part of unspecified bronchus or lung: Secondary | ICD-10-CM | POA: Diagnosis not present

## 2017-09-06 DIAGNOSIS — C7951 Secondary malignant neoplasm of bone: Secondary | ICD-10-CM | POA: Insufficient documentation

## 2017-09-06 DIAGNOSIS — Z951 Presence of aortocoronary bypass graft: Secondary | ICD-10-CM | POA: Diagnosis not present

## 2017-09-06 DIAGNOSIS — C787 Secondary malignant neoplasm of liver and intrahepatic bile duct: Secondary | ICD-10-CM | POA: Insufficient documentation

## 2017-09-06 DIAGNOSIS — K219 Gastro-esophageal reflux disease without esophagitis: Secondary | ICD-10-CM | POA: Diagnosis not present

## 2017-09-06 DIAGNOSIS — R160 Hepatomegaly, not elsewhere classified: Secondary | ICD-10-CM

## 2017-09-06 DIAGNOSIS — Z79899 Other long term (current) drug therapy: Secondary | ICD-10-CM | POA: Diagnosis not present

## 2017-09-06 DIAGNOSIS — Z7982 Long term (current) use of aspirin: Secondary | ICD-10-CM | POA: Insufficient documentation

## 2017-09-06 DIAGNOSIS — I252 Old myocardial infarction: Secondary | ICD-10-CM | POA: Insufficient documentation

## 2017-09-06 DIAGNOSIS — K5730 Diverticulosis of large intestine without perforation or abscess without bleeding: Secondary | ICD-10-CM | POA: Diagnosis not present

## 2017-09-06 LAB — CBC
HEMATOCRIT: 43.6 % (ref 39.0–52.0)
HEMOGLOBIN: 15.2 g/dL (ref 13.0–17.0)
MCH: 30.5 pg (ref 26.0–34.0)
MCHC: 34.9 g/dL (ref 30.0–36.0)
MCV: 87.4 fL (ref 78.0–100.0)
Platelets: 317 10*3/uL (ref 150–400)
RBC: 4.99 MIL/uL (ref 4.22–5.81)
RDW: 13.7 % (ref 11.5–15.5)
WBC: 6.5 10*3/uL (ref 4.0–10.5)

## 2017-09-06 LAB — APTT: APTT: 29 s (ref 24–36)

## 2017-09-06 LAB — PROTIME-INR
INR: 1.02
PROTHROMBIN TIME: 13.3 s (ref 11.4–15.2)

## 2017-09-06 MED ORDER — LIDOCAINE HCL (PF) 1 % IJ SOLN
INTRAMUSCULAR | Status: AC
  Filled 2017-09-06: qty 30

## 2017-09-06 MED ORDER — FENTANYL CITRATE (PF) 100 MCG/2ML IJ SOLN
INTRAMUSCULAR | Status: AC | PRN
  Administered 2017-09-06 (×3): 50 ug via INTRAVENOUS

## 2017-09-06 MED ORDER — FENTANYL CITRATE (PF) 100 MCG/2ML IJ SOLN
INTRAMUSCULAR | Status: AC
  Filled 2017-09-06: qty 4

## 2017-09-06 MED ORDER — MIDAZOLAM HCL 2 MG/2ML IJ SOLN
INTRAMUSCULAR | Status: AC
  Filled 2017-09-06: qty 4

## 2017-09-06 MED ORDER — HYDROCODONE-ACETAMINOPHEN 5-325 MG PO TABS: 1.0000 | ORAL_TABLET | ORAL | Status: DC | PRN

## 2017-09-06 MED ORDER — GELATIN ABSORBABLE 12-7 MM EX MISC
CUTANEOUS | Status: AC
  Filled 2017-09-06: qty 1

## 2017-09-06 MED ORDER — SODIUM CHLORIDE 0.9 % IV SOLN
INTRAVENOUS | Status: DC
  Administered 2017-09-06: 08:00:00 via INTRAVENOUS

## 2017-09-06 MED ORDER — MIDAZOLAM HCL 2 MG/2ML IJ SOLN
INTRAMUSCULAR | Status: AC | PRN
  Administered 2017-09-06: 1 mg via INTRAVENOUS
  Administered 2017-09-06: 0.5 mg via INTRAVENOUS
  Administered 2017-09-06 (×2): 1 mg via INTRAVENOUS

## 2017-09-06 NOTE — Procedures (Signed)
Interventional Radiology Procedure Note  Procedure: Ultrasound guided core biopsy of liver lesion  Complications: None  Estimated Blood Loss: < 10 mL  Findings: Hypoechoic lesion localized in liver near juncture of right and left lobes, measuring 2.5 cm in greatest diameter. 18 G core biopsy x 2 via 17 G needle.  Gelfoam pledgets advanced through outer needle on completion.  Plan: 2 hour bedrest recovery.  Venetia Night. Kathlene Cote, M.D Pager:  [phone]

## 2017-09-06 NOTE — Sedation Documentation (Signed)
Patient is resting comfortably. 

## 2017-09-06 NOTE — H&P (Signed)
Chief Complaint: Patient was seen in consultation today for liver lesion biopsy at the request of Gerhardt,Edward B  Referring Physician(s): Grace Isaac  Supervising Physician: Aletta Edouard  Patient Status: [facility] - Out-pt  History of Present Illness: Anthony [name] is a 47 y.o. male   CABG 06/2017 Found right lung nodule on POST follow up CXR Work up  Included CT and PET  PET 1/29: IMPRESSION: 1. Hypermetabolic RIGHT upper lobe pulmonary nodule most consistent prior bronchogenic carcinoma. 2. Hypermetabolic RIGHT hilar lymph node and RIGHT paratracheal lymph node most consistent with nodal metastasis. 3. Single hypermetabolic lesion within the central liver most concerning for hepatic metastasis. 4. Evidence skeletal metastasis to the C4 vertebral body. Suspicion of metastatic lesion within the LEFT intertrochanteric femur.  MRI 2/4: IMPRESSION: 1. Enhancing lesion in the central LEFT hepatic lobe corresponds to hypermetabolic lesion on comparison PET-CT scan and is most consistent with a hepatic metastasis. Recommend ultrasound-guided percutaneous biopsy. 2. Probable small hemangioma within the inferior aspect of the RIGHT hepatic lobe.  Now scheduled for liver lesion biopsy LD Plavix 6 days ago   Past Medical History:  Diagnosis Date  . Acid reflux   . NSTEMI (non-ST elevated myocardial infarction) ([facility]) 06/13/2017   [name] 06/14/2017  . Tailbone injury since 1993   "cracked"    Past Surgical History:  Procedure Laterality Date  . CARDIAC CATHETERIZATION  06/14/2017  . CORONARY ARTERY BYPASS GRAFT N/A 06/19/2017   Procedure: CORONARY ARTERY BYPASS GRAFTING (CABG), OFF PUMP, times one using the left internal mammary artery to LAD;  Surgeon: Grace Isaac, MD;  Location: [facility];  Service: Open Heart Surgery;  Laterality: N/A;  . LEFT HEART CATH AND CORONARY ANGIOGRAPHY N/A 06/14/2017   Procedure: LEFT HEART CATH AND CORONARY ANGIOGRAPHY;  Surgeon:  Sherren Mocha, MD;  Location: [facility];  Service: Cardiovascular;  Laterality: N/A;  . TEE WITHOUT CARDIOVERSION N/A 06/19/2017   Procedure: TRANSESOPHAGEAL ECHOCARDIOGRAM (TEE);  Surgeon: Grace Isaac, MD;  Location: [facility];  Service: Open Heart Surgery;  Laterality: N/A;  . TONSILLECTOMY  ~ 1977    Allergies: Patient has no known allergies.  Medications: Prior to Admission medications   Medication Sig Start Date End Date Taking? Authorizing Provider  aspirin EC 81 MG EC tablet Take 1 tablet (81 mg total) by mouth daily. 06/23/17  Yes Barrett, Erin R, PA-C  atorvastatin (LIPITOR) 80 MG tablet Take 1 tablet (80 mg total) by mouth daily at 6 PM. 06/23/17  Yes Barrett, Erin R, PA-C  clopidogrel (PLAVIX) 75 MG tablet Take 1 tablet (75 mg total) by mouth daily. 06/23/17  Yes Barrett, Erin R, PA-C  esomeprazole (NEXIUM) 20 MG capsule Take 20 mg by mouth every other day.    Yes [provider]  lisinopril (PRINIVIL,ZESTRIL) 5 MG tablet Take 1 tablet (5 mg total) by mouth daily. 06/23/17  Yes Barrett, Erin R, PA-C  metoprolol tartrate (LOPRESSOR) 25 MG tablet Take 0.5 tablets (12.5 mg total) by mouth 2 (two) times daily. 06/23/17  Yes Barrett, Erin R, PA-C  loratadine (CLARITIN) 10 MG tablet Take 10 mg daily as needed by mouth for allergies.    [provider]     Family History  Problem Relation Age of Onset  . Alcohol abuse Mother   . Heart disease Father   . Arrhythmia Father   . Aneurysm Maternal Grandmother     Social History   Socioeconomic History  . Marital status: Divorced    Spouse name:  None  . Number of children: None  . Years of education: None  . Highest education level: None  Social Needs  . Financial resource strain: None  . Food insecurity - worry: None  . Food insecurity - inability: None  . Transportation needs - medical: None  . Transportation needs - non-medical: None  Occupational History  . None  Tobacco Use  . Smoking  status: Never Smoker  . Smokeless tobacco: Current User    Types: Snuff  Substance and Sexual Activity  . Alcohol use: Yes    Comment: 06/16/2017 "used to drink alot; pretty much stopped~ 01/2015; will have a couple drinks/month now"  . Drug use: No    Comment: 06/14/2017 "nothing since 2000"  . Sexual activity: Yes  Other Topics Concern  . None  Social History Narrative  . None    Review of Systems: A 12 point ROS discussed and pertinent positives are indicated in the HPI above.  All other systems are negative.  Review of Systems  Constitutional: Negative for activity change, fatigue, fever and unexpected weight change.  Respiratory: Negative for cough and shortness of breath.   Gastrointestinal: Negative for abdominal pain.  Musculoskeletal: Negative for back pain and gait problem.  Neurological: Negative for weakness.  Psychiatric/Behavioral: Negative for behavioral problems and confusion.    Vital Signs: BP 113/86   Pulse 74   Temp 98.6 F (37 C)   Resp 18   Ht 5\' 10"  (1.778 m)   Wt 200 lb (90.7 kg)   SpO2 100%   BMI 28.70 kg/m   Physical Exam  Constitutional: He is oriented to person, place, and time.  Cardiovascular: Normal rate, regular rhythm and normal heart sounds.  Pulmonary/Chest: Effort normal.  Abdominal: Soft. Bowel sounds are normal.  Musculoskeletal: Normal range of motion.  Neurological: He is alert and oriented to person, place, and time.  Skin: Skin is warm and dry.  Psychiatric: He has a normal mood and affect. His behavior is normal. Judgment and thought content normal.  Nursing note and vitals reviewed.   Imaging: Mr Liver W Wo Contrast  Result Date: 09/04/2017 CLINICAL DATA:  Hypermetabolic lesion within the liver on PET-CT scan. Lesion concerning for metastatic lung cancer EXAM: MRI ABDOMEN WITHOUT AND WITH CONTRAST TECHNIQUE: Multiplanar multisequence MR imaging of the abdomen was performed both before and after the administration of  intravenous contrast. CONTRAST:  63mL MULTIHANCE GADOBENATE DIMEGLUMINE 529 MG/ML IV SOLN COMPARISON:  PET-CT scan 08/29/2017 FINDINGS: Lower chest:  Lung bases are clear. Hepatobiliary: Rim enhancing lesion in the central LEFT hepatic lobe (segment 4A) measures 2.2 x 1.9 cm and corresponds to the hypermetabolic lesion on comparison PET-CT scan. Lesion has continuous peripheral enhancement pattern suggestive of hepatic metastasis. Lesion is hyperintense on T2 weighted imaging which can be seen with hemangiomas (image 12, series 5) however the continuous rim enhancement is most suggestive of metastatic lesion. No additional hepatic lesions are identified. Within the most inferior aspect of the RIGHT hepatic lobe 10 mm hyperintense lesion on T2 weighted imaging (image 30, series 38) demonstrates delayed enhancement (image 49, series 8 most suggestive of a small hemangioma. Pancreas: Normal pancreatic parenchymal intensity. No ductal dilatation or inflammation. Spleen: Normal spleen. Adrenals/urinary tract: Adrenal glands and kidneys are normal. Stomach/Bowel: Stomach and limited of the small bowel is unremarkable Vascular/Lymphatic: Abdominal aortic normal caliber. No retroperitoneal periportal lymphadenopathy. Musculoskeletal: No aggressive osseous lesion IMPRESSION: 1. Enhancing lesion in the central LEFT hepatic lobe corresponds to hypermetabolic lesion on comparison PET-CT  scan and is most consistent with a hepatic metastasis. Recommend ultrasound-guided percutaneous biopsy. 2. Probable small hemangioma within the inferior aspect of the RIGHT hepatic lobe. Electronically Signed   By: Suzy Bouchard M.D.   On: 09/04/2017 20:44   Nm Pet Image Initial (pi) Skull Base To Thigh  Result Date: 08/29/2017 CLINICAL DATA:  Initial treatment strategy for solitary pulmonary nodule. EXAM: NUCLEAR MEDICINE PET SKULL BASE TO THIGH TECHNIQUE: 11.7 mCi F-18 FDG was injected intravenously. Full-ring PET imaging was performed  from the skull base to thigh after the radiotracer. CT data was obtained and used for attenuation correction and anatomic localization. FASTING BLOOD GLUCOSE:  Value: 42 mg/dl COMPARISON:  CT 07/18/2017 FINDINGS: NECK No hypermetabolic lymph nodes in the neck. CHEST In the medial aspect of the RIGHT upper lobe 2.4 cm nodule is intensely metabolic with SUV max equal 15. Small hypermetabolic RIGHT hilar lymph node is present. Hypermetabolic RIGHT paratracheal lymph node measures 12 mm short axis with SUV max equal 4.6. ABDOMEN/PELVIS Low-density lesion in the central LEFT hepatic lobe measures 2.2 cm (image 98, series 4) and has metabolic activity above background liver activity (SUV max equal 4.9). Normal adrenal glands. No hypermetabolic abdominopelvic lymph nodes. Kidneys normal. Diverticula of the sigmoid colon. Metabolic activity ascending colon is favored benign physiologic. No hypermetabolic abdominopelvic lymph nodes. SKELETON Lesion in the C4 vertebral body of the cervical spine has intense metabolic activity with SUV max equal 4.7 and correlates with a 1.2 cm lytic lesion within the vertebral body. Additional moderate metabolic activity within the proximal LEFT femur at the level of the inferior trochanter. (SUV max equal 4.8). No CT correlation IMPRESSION: 1. Hypermetabolic RIGHT upper lobe pulmonary nodule most consistent prior bronchogenic carcinoma. 2. Hypermetabolic RIGHT hilar lymph node and RIGHT paratracheal lymph node most consistent with nodal metastasis. 3. Single hypermetabolic lesion within the central liver most concerning for hepatic metastasis. 4. Evidence skeletal metastasis to the C4 vertebral body. Suspicion of metastatic lesion within the LEFT intertrochanteric femur. Electronically Signed   By: Suzy Bouchard M.D.   On: 08/29/2017 17:09    Labs:  CBC: Recent Labs    06/20/17 1718 06/20/17 1725 06/21/17 0319 06/22/17 0257 07/17/17 1006  WBC 13.2*  --  13.7* 10.7* 8.4  HGB  13.4 13.9 12.2* 11.7* 14.7  HCT 38.6* 41.0 36.1* 34.4* 42.6  PLT 228  --  232 217 334    COAGS: Recent Labs    06/14/17 0754 06/16/17 1837 06/19/17 1136  INR 1.01 1.03 1.19  APTT  --  85* 30    BMP: Recent Labs    06/20/17 0446 06/20/17 1718 06/20/17 1725 06/21/17 0319 06/22/17 0257 07/17/17 1006  NA 134*  --  136 132* 135 141  K 4.5  --  4.0 4.1 3.7 4.8  CL 103  --  98* 99* 98* 101  CO2 24  --   --  27 29 25   GLUCOSE 102*  --  129* 109* 103* 84  BUN 12  --  15 14 9 12   CALCIUM 8.6*  --   --  8.7* 8.7* 10.2  CREATININE 1.02 0.98 0.90 0.96 1.00 1.01  GFRNONAA >60 >60  --  >60 >60 89  GFRAA >60 >60  --  >60 >60 103    LIVER FUNCTION TESTS: Recent Labs    06/13/17 2313 06/16/17 1837  BILITOT 0.6 0.6  AST 23 34  ALT 33 52  ALKPHOS 46 50  PROT 6.8 7.0  ALBUMIN 3.9 4.0  TUMOR MARKERS: No results for input(s): AFPTM, CEA, CA199, CHROMGRNA in the last 8760 hours.  Assessment and Plan:  Liver lesion +PET Found post op follow up of CABG Right lung lesion; C4 lesion; left femur lesion; LAN Scheduled for liver lesion bx Risks and benefits discussed with the patient including, but not limited to bleeding, infection, damage to adjacent structures or low yield requiring additional tests.  All of the patient's questions were answered, patient is agreeable to proceed. Consent signed and in chart.   Thank you for this interesting consult.  I greatly enjoyed meeting Zacchaeus Santosuosso and look forward to participating in their care.  A copy of this report was sent to the requesting provider on this date.  Electronically Signed: Lavonia Drafts, PA-C 09/06/2017, 6:49 AM   I spent a total of  30 Minutes   in face to face in clinical consultation, greater than 50% of which was counseling/coordinating care for liver lesion bx

## 2017-09-06 NOTE — Discharge Instructions (Signed)
Liver Biopsy, Care After  Refer to this sheet in the next few weeks. These instructions provide you with information on caring for yourself after your procedure. Your health care provider may also give you more specific instructions. Your treatment has been planned according to current medical practices, but problems sometimes occur. Call your health care provider if you have any problems or questions after your procedure.  What can I expect after the procedure?  After your procedure, it is typical to have the following:  · A small amount of discomfort in the area where the biopsy was done and in the right shoulder or shoulder blade.  · A small amount of bruising around the area where the biopsy was done and on the skin over the liver.  · Sleepiness and fatigue for the rest of the day.    Follow these instructions at home:  · Rest at home for 1-2 days or as directed by your health care provider.  · Have a friend or family member stay with you for at least 24 hours.  · Because of the medicines used during the procedure, you should not do the following things in the first 24 hours:  ? Drive.  ? Use machinery.  ? Be responsible for the care of other people.  ? Sign legal documents.  ? Take a bath or shower.  · There are many different ways to close and cover an incision, including stitches, skin glue, and adhesive strips. Follow your health care provider's instructions on:  ? Incision care.  ? Bandage (dressing) changes and removal.  ? Incision closure removal.  · Do not drink alcohol in the first week.  · Do not lift more than 5 pounds or play contact sports for 2 weeks after this test.  · Take medicines only as directed by your health care provider. Do not take medicine containing aspirin or non-steroidal anti-inflammatory medicines such as ibuprofen for 1 week after this test.  · It is your responsibility to get your test results.  Contact a health care provider if:  · You have increased bleeding from an incision  that results in more than a small spot of blood.  · You have redness, swelling, or increasing pain in any incisions.  · You notice a discharge or a bad smell coming from any of your incisions.  · You have a fever or chills.  Get help right away if:  · You develop swelling, bloating, or pain in your abdomen.  · You become dizzy or faint.  · You develop a rash.  · You are nauseous or vomit.  · You have difficulty breathing, feel short of breath, or feel faint.  · You develop chest pain.  · You have problems with your speech or vision.  · You have trouble balancing or moving your arms or legs.  This information is not intended to replace advice given to you by your health care provider. Make sure you discuss any questions you have with your health care provider.  Document Released: 02/04/2005 Document Revised: 12/24/2015 Document Reviewed: 09/13/2013  Elsevier Interactive Patient Education © 2018 [employer]

## 2017-09-06 NOTE — Telephone Encounter (Signed)
Spoke with patient to confirm tomorrows arrival time for [facility] appointment

## 2017-09-07 ENCOUNTER — Ambulatory Visit: Payer: BLUE CROSS/BLUE SHIELD | Attending: Internal Medicine | Admitting: Physical Therapy

## 2017-09-07 ENCOUNTER — Encounter: Payer: Self-pay | Admitting: Internal Medicine

## 2017-09-07 ENCOUNTER — Other Ambulatory Visit: Payer: Self-pay | Admitting: Medical Oncology

## 2017-09-07 ENCOUNTER — Inpatient Hospital Stay: Payer: BLUE CROSS/BLUE SHIELD

## 2017-09-07 ENCOUNTER — Encounter: Payer: BLUE CROSS/BLUE SHIELD | Admitting: Cardiothoracic Surgery

## 2017-09-07 ENCOUNTER — Inpatient Hospital Stay: Payer: BLUE CROSS/BLUE SHIELD | Attending: Internal Medicine | Admitting: Internal Medicine

## 2017-09-07 ENCOUNTER — Encounter: Payer: Self-pay | Admitting: General Practice

## 2017-09-07 ENCOUNTER — Other Ambulatory Visit: Payer: Self-pay | Admitting: Internal Medicine

## 2017-09-07 ENCOUNTER — Ambulatory Visit
Admission: RE | Admit: 2017-09-07 | Discharge: 2017-09-07 | Disposition: A | Discharge status: Home / Self Care | Payer: BLUE CROSS/BLUE SHIELD | Source: Ambulatory Visit | Attending: Radiation Oncology | Admitting: Radiation Oncology

## 2017-09-07 ENCOUNTER — Other Ambulatory Visit: Payer: Self-pay

## 2017-09-07 VITALS — BP 126/76 | HR 83 | Temp 98.6°F | Resp 14 | Ht 70.0 in | Wt 207.0 lb

## 2017-09-07 DIAGNOSIS — K769 Liver disease, unspecified: Secondary | ICD-10-CM

## 2017-09-07 DIAGNOSIS — Z7982 Long term (current) use of aspirin: Secondary | ICD-10-CM

## 2017-09-07 DIAGNOSIS — K219 Gastro-esophageal reflux disease without esophagitis: Secondary | ICD-10-CM | POA: Diagnosis not present

## 2017-09-07 DIAGNOSIS — C3491 Malignant neoplasm of unspecified part of right bronchus or lung: Secondary | ICD-10-CM

## 2017-09-07 DIAGNOSIS — R634 Abnormal weight loss: Secondary | ICD-10-CM

## 2017-09-07 DIAGNOSIS — M6281 Muscle weakness (generalized): Secondary | ICD-10-CM | POA: Diagnosis present

## 2017-09-07 DIAGNOSIS — R599 Enlarged lymph nodes, unspecified: Secondary | ICD-10-CM | POA: Diagnosis not present

## 2017-09-07 DIAGNOSIS — Z7189 Other specified counseling: Secondary | ICD-10-CM

## 2017-09-07 DIAGNOSIS — C3490 Malignant neoplasm of unspecified part of unspecified bronchus or lung: Secondary | ICD-10-CM | POA: Diagnosis present

## 2017-09-07 DIAGNOSIS — Z8781 Personal history of (healed) traumatic fracture: Secondary | ICD-10-CM

## 2017-09-07 DIAGNOSIS — C771 Secondary and unspecified malignant neoplasm of intrathoracic lymph nodes: Secondary | ICD-10-CM | POA: Insufficient documentation

## 2017-09-07 DIAGNOSIS — I252 Old myocardial infarction: Secondary | ICD-10-CM

## 2017-09-07 DIAGNOSIS — R51 Headache: Secondary | ICD-10-CM

## 2017-09-07 DIAGNOSIS — C787 Secondary malignant neoplasm of liver and intrahepatic bile duct: Secondary | ICD-10-CM

## 2017-09-07 DIAGNOSIS — Z79899 Other long term (current) drug therapy: Secondary | ICD-10-CM | POA: Diagnosis not present

## 2017-09-07 DIAGNOSIS — C3411 Malignant neoplasm of upper lobe, right bronchus or lung: Secondary | ICD-10-CM | POA: Insufficient documentation

## 2017-09-07 DIAGNOSIS — C7951 Secondary malignant neoplasm of bone: Secondary | ICD-10-CM

## 2017-09-07 DIAGNOSIS — Z5111 Encounter for antineoplastic chemotherapy: Secondary | ICD-10-CM

## 2017-09-07 DIAGNOSIS — E538 Deficiency of other specified B group vitamins: Secondary | ICD-10-CM | POA: Diagnosis not present

## 2017-09-07 DIAGNOSIS — Z510 Encounter for antineoplastic radiation therapy: Secondary | ICD-10-CM | POA: Insufficient documentation

## 2017-09-07 LAB — CBC WITH DIFFERENTIAL (CANCER CENTER ONLY)
BASOS PCT: 1 %
Basophils Absolute: 0.1 10*3/uL (ref 0.0–0.1)
EOS ABS: 0.1 10*3/uL (ref 0.0–0.5)
EOS PCT: 1 %
HCT: 45.1 % (ref 38.4–49.9)
Hemoglobin: 16 g/dL (ref 13.0–17.1)
LYMPHS ABS: 2.3 10*3/uL (ref 0.9–3.3)
Lymphocytes Relative: 29 %
MCH: 31 pg (ref 27.2–33.4)
MCHC: 35.5 g/dL (ref 32.0–36.0)
MCV: 87.4 fL (ref 79.3–98.0)
Monocytes Absolute: 0.6 10*3/uL (ref 0.1–0.9)
Monocytes Relative: 7 %
Neutro Abs: 5 10*3/uL (ref 1.5–6.5)
Neutrophils Relative %: 62 %
PLATELETS: 346 10*3/uL (ref 140–400)
RBC: 5.16 MIL/uL (ref 4.20–5.82)
RDW: 13.8 % (ref 11.0–14.6)
WBC: 8 10*3/uL (ref 4.0–10.3)
nRBC: 0 /100 WBC

## 2017-09-07 LAB — CMP (CANCER CENTER ONLY)
ALK PHOS: 72 U/L (ref 40–150)
ALT: 45 U/L (ref 0–55)
AST: 22 U/L (ref 5–34)
Albumin: 4.6 g/dL (ref 3.5–5.0)
Anion gap: 11 (ref 3–11)
BUN: 9 mg/dL (ref 7–26)
CALCIUM: 10.2 mg/dL (ref 8.4–10.4)
CHLORIDE: 105 mmol/L (ref 98–109)
CO2: 26 mmol/L (ref 22–29)
CREATININE: 1.12 mg/dL (ref 0.70–1.30)
Glucose, Bld: 91 mg/dL (ref 70–140)
Potassium: 4.2 mmol/L (ref 3.5–5.1)
Sodium: 142 mmol/L (ref 136–145)
Total Bilirubin: 0.7 mg/dL (ref 0.2–1.2)
Total Protein: 8.3 g/dL (ref 6.4–8.3)
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
## 2017-09-07 NOTE — Progress Notes (Signed)
[place] [phone]   [phone] Multidisciplinary thoracic oncology clinic  CONSULT NOTE  REFERRING PHYSICIAN: Dr. Lanelle Bal  REASON FOR CONSULTATION:  47 years old white male recently diagnosed with lung cancer.  HPI Anthony [name] is a 47 y.o. male a never smoker with past medical history significant for coronary artery disease status post myocardial infarction status post coronary artery bypass surgery on June 19, 2017.  After his surgery chest x-ray was performed on June 19, 2017 and that showed 2.1 cm nodular density just lateral to the right hilum suspicious for a pulmonary nodule.  This was followed by CT scan of the chest without contrast on July 18, 2017 and that showed 2.4 x 1.8 cm irregular nodule in the medial right upper lobe suspicious for primary bronchogenic neoplasm with associated pleural retraction.  There was a 1.0 cm short axis high right paratracheal node that was indeterminate.  The scan also showed possible 2.0 cm hypodense lesion in segment 4B of the liver and MRI of the abdomen with and without contrast was recommended.  A PET scan performed on August 29, 2017 showed hypermetabolic right upper lobe pulmonary nodule most consistent with bronchogenic carcinoma.  There was hypermetabolic right hilar lymph node and right paratracheal lymph node most consistent with nodal metastasis.  There was single hypermetabolic lesion within the central liver most concerning for hepatic metastasis.  There was also evidence of skeletal metastasis to the C4 vertebral body suspicious for metastatic lesion in addition to suspicious lesion within the left intertrochanteric femur.  MRI of the liver on 09/04/2017 showed enhancing lesion in the central left hepatic lobe corresponding to the hypermetabolic lesion on the comparison PET/CT scan and most consistent with a hepatic metastasis.  On September 06, 2017 the patient underwent ultrasound-guided  core biopsy of the liver by interventional radiology.  The final pathology is consistent with adenocarcinoma.  Further immunohistochemical stains are still pending. [name] referred the patient to the multidisciplinary thoracic oncology clinic today for further evaluation and recommendation regarding treatment of his condition. When seen today the patient is feeling fine with no specific complaints except for mild shortness of breath with exertion.  He also had mild pain on the right upper quadrant after the biopsy of the liver yesterday.  He denied having any cough or hemoptysis.  He denied having any nausea, vomiting, diarrhea or constipation.  He has intermittent headache with some visual changes.  He lost around 15 pounds in the last 3 months after his cardiac surgery. Family history Father died from heart disease, mother died from natural causes at age 71.  The patient has no family history of malignancy. He is divorced and has 2 children his son age 4 and a daughter age 65.  He was accompanied by 2 of his friend Anthony [name] and Anthony [name].  He works in [employer].  He has no history for smoking but drinks alcohol occasionally and no history of drug abuse. HPI  Past Medical History:  Diagnosis Date  . Acid reflux   . NSTEMI (non-ST elevated myocardial infarction) ([place]) 06/13/2017   Anthony [name] 06/14/2017  . Tailbone injury since 1993   "cracked"    Past Surgical History:  Procedure Laterality Date  . CARDIAC CATHETERIZATION  06/14/2017  . CORONARY ARTERY BYPASS GRAFT N/A 06/19/2017   Procedure: CORONARY ARTERY BYPASS GRAFTING (CABG), OFF PUMP, times one using the left internal mammary artery to LAD;  Surgeon: Anthony Isaac, MD;  Location: [facility]  OR;  Service: Open Heart Surgery;  Laterality: N/A;  . LEFT HEART CATH AND CORONARY ANGIOGRAPHY N/A 06/14/2017   Procedure: LEFT HEART CATH AND CORONARY ANGIOGRAPHY;  Surgeon: Anthony Mocha, MD;  Location: [facility];  Service:  Cardiovascular;  Laterality: N/A;  . TEE WITHOUT CARDIOVERSION N/A 06/19/2017   Procedure: TRANSESOPHAGEAL ECHOCARDIOGRAM (TEE);  Surgeon: Anthony Isaac, MD;  Location: [facility];  Service: Open Heart Surgery;  Laterality: N/A;  . TONSILLECTOMY  ~ 1977    Family History  Problem Relation Age of Onset  . Alcohol abuse Mother   . Heart disease Father   . Arrhythmia Father   . Aneurysm Maternal Grandmother     Social History Social History   Tobacco Use  . Smoking status: Never Smoker  . Smokeless tobacco: Current User    Types: Snuff  Substance Use Topics  . Alcohol use: Yes    Comment: 06/16/2017 "used to drink alot; pretty much stopped~ 01/2015; will have a couple drinks/month now"  . Drug use: No    Comment: 06/14/2017 "nothing since 2000"    No Known Allergies  Current Outpatient Medications  Medication Sig Dispense Refill  . aspirin EC 81 MG EC tablet Take 1 tablet (81 mg total) by mouth daily.    Marland Kitchen atorvastatin (LIPITOR) 80 MG tablet Take 1 tablet (80 mg total) by mouth daily at 6 PM. 30 tablet 3  . clopidogrel (PLAVIX) 75 MG tablet Take 1 tablet (75 mg total) by mouth daily. 30 tablet 3  . esomeprazole (NEXIUM) 20 MG capsule Take 20 mg by mouth every other day.     . lisinopril (PRINIVIL,ZESTRIL) 5 MG tablet Take 1 tablet (5 mg total) by mouth daily. 30 tablet 3  . loratadine (CLARITIN) 10 MG tablet Take 10 mg daily as needed by mouth for allergies.    . metoprolol tartrate (LOPRESSOR) 25 MG tablet Take 0.5 tablets (12.5 mg total) by mouth 2 (two) times daily. 30 tablet 3   No current facility-administered medications for this visit.     Review of Systems  Constitutional: negative Eyes: negative Ears, nose, mouth, throat, and face: negative Respiratory: positive for dyspnea on exertion Cardiovascular: negative Gastrointestinal: negative Genitourinary:negative Integument/breast: negative Hematologic/lymphatic: negative Musculoskeletal:negative Neurological:  negative Behavioral/Psych: negative Endocrine: negative Allergic/Immunologic: negative  Physical Exam  MVE:HMCNO, healthy, no distress, well nourished, well developed and anxious SKIN: skin color, texture, turgor are normal, no rashes or significant lesions HEAD: Normocephalic, No masses, lesions, tenderness or abnormalities EYES: normal, PERRLA, Conjunctiva are [name] and non-injected EARS: External ears normal, Canals clear OROPHARYNX:no exudate, no erythema and lips, buccal mucosa, and tongue normal  NECK: supple, no adenopathy, no JVD LYMPH:  no palpable lymphadenopathy, no hepatosplenomegaly LUNGS: clear to auscultation , and palpation HEART: regular rate & rhythm, no murmurs and no gallops ABDOMEN:abdomen soft, non-tender, normal bowel sounds and no masses or organomegaly BACK: Back symmetric, no curvature., No CVA tenderness EXTREMITIES:no joint deformities, effusion, or inflammation, no edema, no skin discoloration  NEURO: alert & oriented x 3 with fluent speech, no focal motor/sensory deficits  PERFORMANCE STATUS: ECOG 1  LABORATORY DATA: Lab Results  Component Value Date   WBC 6.5 09/06/2017   HGB 15.2 09/06/2017   HCT 43.6 09/06/2017   MCV 87.4 09/06/2017   PLT 317 09/06/2017      Chemistry      Component Value Date/Time   NA 141 07/17/2017 1006   K 4.8 07/17/2017 1006   CL 101 07/17/2017 1006  CO2 25 07/17/2017 1006   BUN 12 07/17/2017 1006   CREATININE 1.01 07/17/2017 1006      Component Value Date/Time   CALCIUM 10.2 07/17/2017 1006   ALKPHOS 50 06/16/2017 1837   AST 34 06/16/2017 1837   ALT 52 06/16/2017 1837   BILITOT 0.6 06/16/2017 1837       RADIOGRAPHIC STUDIES: Mr Liver W Wo Contrast  Result Date: 09/04/2017 CLINICAL DATA:  Hypermetabolic lesion within the liver on PET-CT scan. Lesion concerning for metastatic lung cancer EXAM: MRI ABDOMEN WITHOUT AND WITH CONTRAST TECHNIQUE: Multiplanar multisequence MR imaging of the abdomen was performed  both before and after the administration of intravenous contrast. CONTRAST:  56m MULTIHANCE GADOBENATE DIMEGLUMINE 529 MG/ML IV SOLN COMPARISON:  PET-CT scan 08/29/2017 FINDINGS: Lower chest:  Lung bases are clear. Hepatobiliary: Rim enhancing lesion in the central LEFT hepatic lobe (segment 4A) measures 2.2 x 1.9 cm and corresponds to the hypermetabolic lesion on comparison PET-CT scan. Lesion has continuous peripheral enhancement pattern suggestive of hepatic metastasis. Lesion is hyperintense on T2 weighted imaging which can be seen with hemangiomas (image 12, series 5) however the continuous rim enhancement is most suggestive of metastatic lesion. No additional hepatic lesions are identified. Within the most inferior aspect of the RIGHT hepatic lobe 10 mm hyperintense lesion on T2 weighted imaging (image 30, series 38) demonstrates delayed enhancement (image 49, series 8 most suggestive of a small hemangioma. Pancreas: Normal pancreatic parenchymal intensity. No ductal dilatation or inflammation. Spleen: Normal spleen. Adrenals/urinary tract: Adrenal glands and kidneys are normal. Stomach/Bowel: Stomach and limited of the small bowel is unremarkable Vascular/Lymphatic: Abdominal aortic normal caliber. No retroperitoneal periportal lymphadenopathy. Musculoskeletal: No aggressive osseous lesion IMPRESSION: 1. Enhancing lesion in the central LEFT hepatic lobe corresponds to hypermetabolic lesion on comparison PET-CT scan and is most consistent with a hepatic metastasis. Recommend ultrasound-guided percutaneous biopsy. 2. Probable small hemangioma within the inferior aspect of the RIGHT hepatic lobe. Electronically Signed   By: SSuzy BouchardM.D.   On: 09/04/2017 20:44   Nm Pet Image Initial (pi) Skull Base To Thigh  Result Date: 08/29/2017 CLINICAL DATA:  Initial treatment strategy for solitary pulmonary nodule. EXAM: NUCLEAR MEDICINE PET SKULL BASE TO THIGH TECHNIQUE: 11.7 mCi F-18 FDG was injected  intravenously. Full-ring PET imaging was performed from the skull base to thigh after the radiotracer. CT data was obtained and used for attenuation correction and anatomic localization. FASTING BLOOD GLUCOSE:  Value: 42 mg/dl COMPARISON:  CT 07/18/2017 FINDINGS: NECK No hypermetabolic lymph nodes in the neck. CHEST In the medial aspect of the RIGHT upper lobe 2.4 cm nodule is intensely metabolic with SUV max equal 15. Small hypermetabolic RIGHT hilar lymph node is present. Hypermetabolic RIGHT paratracheal lymph node measures 12 mm short axis with SUV max equal 4.6. ABDOMEN/PELVIS Low-density lesion in the central LEFT hepatic lobe measures 2.2 cm (image 98, series 4) and has metabolic activity above background liver activity (SUV max equal 4.9). Normal adrenal glands. No hypermetabolic abdominopelvic lymph nodes. Kidneys normal. Diverticula of the sigmoid colon. Metabolic activity ascending colon is favored benign physiologic. No hypermetabolic abdominopelvic lymph nodes. SKELETON Lesion in the C4 vertebral body of the cervical spine has intense metabolic activity with SUV max equal 4.7 and correlates with a 1.2 cm lytic lesion within the vertebral body. Additional moderate metabolic activity within the proximal LEFT femur at the level of the inferior trochanter. (SUV max equal 4.8). No CT correlation IMPRESSION: 1. Hypermetabolic RIGHT upper lobe pulmonary nodule most consistent prior  bronchogenic carcinoma. 2. Hypermetabolic RIGHT hilar lymph node and RIGHT paratracheal lymph node most consistent with nodal metastasis. 3. Single hypermetabolic lesion within the central liver most concerning for hepatic metastasis. 4. Evidence skeletal metastasis to the C4 vertebral body. Suspicion of metastatic lesion within the LEFT intertrochanteric femur. Electronically Signed   By: Suzy Bouchard M.D.   On: 08/29/2017 17:09   US Biopsy (liver)  Result Date: 09/06/2017 CLINICAL DATA:  2.4 cm right upper lobe lung mass  with associated hypermetabolic right paratracheal metastatic lymphadenopathy, left lobe liver metastasis and hypermetabolic bone metastases by PET scan. Additional MRI of the abdomen demonstrates the liver lesion in segment 4A of the left lobe measuring approximately 2.2 cm and having characteristics consistent with a probable metastatic lesion. The patient presents for biopsy of the liver lesion. EXAM: ULTRASOUND GUIDED CORE BIOPSY OF LIVER MEDICATIONS: 3.5 mg IV Versed; 150 mcg IV Fentanyl Total Moderate Sedation Time: 28 minutes. The patient's level of consciousness and physiologic status were continuously monitored during the procedure by Radiology nursing. PROCEDURE: The procedure, risks, benefits, and alternatives were explained to the patient. Questions regarding the procedure were encouraged and answered. The patient understands and consents to the procedure. A time out was performed prior to initiating the procedure. The abdominal wall was prepped with chlorhexidine in a sterile fashion, and a sterile drape was applied covering the operative field. A sterile gown and sterile gloves were used for the procedure. Local anesthesia was provided with 1% Lidocaine. Ultrasound was used to localize a lesion within the left lobe of the liver. Under direct ultrasound guidance, a 17 gauge needle was advanced to the anterior margin of the lesion. Two separate coaxial 18 gauge core biopsy samples were obtained through the lesion. Core biopsy samples were submitted in formalin. Gel-Foam pledgets were advanced through the outer needle as the needle was retracted. COMPLICATIONS: None. FINDINGS: Oval-shaped hypoechoic mass is identified in the left lobe of the liver. The surrounding liver parenchyma is echogenic, consistent with steatosis. The lesion measures approximately 2.0 x 2.6 cm in greatest dimensions by ultrasound. Solid tissue was obtained. IMPRESSION: Ultrasound-guided core biopsy performed of a lesion in the left  lobe of the liver measuring 2.6 cm in greatest diameter by ultrasound. Electronically Signed   By: Aletta Edouard M.D.   On: 09/06/2017 10:21    ASSESSMENT: This is a very pleasant 47 years old white male recently diagnosed with a stage IVB (T1b, N2, M1c) non-small cell lung cancer, adenocarcinoma presented with right upper lobe lung nodule in addition to right hilar and mediastinal lymphadenopathy as well as metastatic liver and bone disease diagnosed in February 2019.   PLAN: I had a lengthy discussion with the patient and his friends today about his current disease is stage, prognosis and treatment options. I personally and independently reviewed his imaging studies and discussed the results and showed the images to the patient today. I explained to the patient that he has an incurable condition and all the treatment options will be of palliative nature. I discussed with the patient his option for treatment and prognosis including palliative care, versus systemic chemotherapy plus/minus immunotherapy versus treatment with targeted therapy if he has molecular mutations. I will complete the staging workup by ordering MRI of the brain to rule out brain metastasis. The patient is currently asymptomatic and he would like to wait for the molecular studies before considering systemic therapy. I would also send the blood test for EGFR mutation today. The patient will come back  for follow-up visit in 2-3 weeks for reevaluation and discussion of his treatment options based on the molecular studies. He was seen during the multidisciplinary thoracic oncology clinic today by medical oncology, radiation oncology, thoracic navigator, thoracic surgeon as well as physical therapist. He was advised to call immediately if he has any concerning symptoms in the interval. The patient voices understanding of current disease status and treatment options and is in agreement with the current care plan.  All questions  were answered. The patient knows to call the clinic with any problems, questions or concerns. We can certainly see the patient much sooner if necessary.  Thank you so much for allowing me to participate in the care of Anthony [name]. I will continue to follow up the patient with you and assist in his care.  I spent 55 minutes counseling the patient face to face. The total time spent in the appointment was 80 minutes.  Disclaimer: This note was dictated with voice recognition software. Similar sounding words can inadvertently be transcribed and may not be corrected upon review.   Eilleen Kempf September 07, 2017, 2:48 PM

## 2017-09-07 NOTE — Progress Notes (Signed)
[facility] Psychosocial Distress Screening Clinical Social Work  Clinical Social Work was referred by distress screening protocol.  The patient scored a 8 on the Psychosocial Distress Thermometer which indicates moderate distress. Clinical Social Worker Edwyna Shell to assess for distress and other psychosocial needs. CSW and patient discussed common feeling and emotions when being diagnosed with cancer, and the importance of support during treatment. CSW informed patient of the support team and support services at [facility]. CSW provided contact information and encouraged patient to call with any questions or concerns.  Patient's main concern is impact of diagnosis on his children, ages 63 and 48.  Patient and ex wife share custody, with children living 50% of the time w each parent.  Children are aware that patient is in medical treatment, had heart attack in November and are aware that patient is receiving ongoing medical work ups.  Discussed importance of providing support to children without overwhelming them w too much information.  Discussed plan for patient to inform adults in the family prior to talking w children so that children have adequate support to process feelings about father's illness.  Patient has two supportive friends who accompanied him to clinic visit today, they will remain involved.  Patient encouraged to process his own reactions prior to talking w children, maintaining open communication lines and context for information.  Patient will await results of further testing which will determine course of treatment needed prior to talking w children.  CSW provided several brochures on talking w teens about cancer, as well as information on [employer] support for children w parents diagnosed w chronic illness.  Gave information on Support Center and CSW contact information, encouraged to call as needed.  Will follow up w patient at upcoming visits.  ONCBCN DISTRESS SCREENING 09/07/2017  Screening  Type Initial Screening  Distress experienced in past week (1-10) 8  Practical problem type Work/school;Childcare     Clinical Social Worker follow up needed: Yes.    If yes, follow up plan:  Follow up w patient at upcoming visits to ensure he has adequate support.    Edwyna Shell, LCSW Clinical Social Worker Phone:  [phone]

## 2017-09-07 NOTE — Therapy (Signed)
[place], [place], [address] Phone: [phone]   Fax:  [phone]  Physical Therapy Evaluation  Patient Details  Name: Anthony [name] MRN: [id number] Date of Birth: June 12, 1971 Referring Provider: Dr. [name]   Encounter Date: 09/07/2017  PT End of Session - 09/07/17 1643    Visit Number  1    Number of Visits  1    PT Start Time  1550    PT Stop Time  1620    PT Time Calculation (min)  30 min    Activity Tolerance  Patient tolerated treatment well    Behavior During Therapy  [facility] for tasks assessed/performed       Past Medical History:  Diagnosis Date  . Acid reflux   . NSTEMI (non-ST elevated myocardial infarction) ([place]) 06/13/2017   [name] 06/14/2017  . Tailbone injury since 1993   "cracked"    Past Surgical History:  Procedure Laterality Date  . CARDIAC CATHETERIZATION  06/14/2017  . CORONARY ARTERY BYPASS GRAFT N/A 06/19/2017   Procedure: CORONARY ARTERY BYPASS GRAFTING (CABG), OFF PUMP, times one using the left internal mammary artery to LAD;  Surgeon: Grace Isaac, MD;  Location: [place];  Service: Open Heart Surgery;  Laterality: N/A;  . LEFT HEART CATH AND CORONARY ANGIOGRAPHY N/A 06/14/2017   Procedure: LEFT HEART CATH AND CORONARY ANGIOGRAPHY;  Surgeon: Sherren Mocha, MD;  Location: [facility];  Service: Cardiovascular;  Laterality: N/A;  . TEE WITHOUT CARDIOVERSION N/A 06/19/2017   Procedure: TRANSESOPHAGEAL ECHOCARDIOGRAM (TEE);  Surgeon: Grace Isaac, MD;  Location: [facility];  Service: Open Heart Surgery;  Laterality: N/A;  . TONSILLECTOMY  ~ 1977    There were no vitals filed for this visit.   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 I didn't do cardiac rehab, I've just been exercising on my own, and my body tells me when it's enough. He played football through college, so has aches and pains from that,  including chronic intermittent back pain.    Patient is accompained by:  -- two close friends    Pertinent History  Pt. had emergency bypass surgery 06/19/17; incidental finding of lung lesion on post-op chest x-ray. Pt. had subsequent CT and PET scans and was found to have metastatic disease including spinal and left femur mets and a liver lesion. MR and liver biopsy have been done. Pt. was never a smoker; disease is probably adenocarcinoma so tumor will be tested to determine whether he might have targeted therapy, immunotherapy, or chemotherapy.     Patient Stated Goals  get info from all lung clinic providers    Currently in Pain?  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 [name]    Onset Date/Surgical Date  06/19/17    Prior Therapy  none      Precautions   Precautions  Other (comment)    Precaution Comments  bony metastases including to spine and left femur, liver      Restrictions   Weight Bearing Restrictions  No says Dr. Lucky Cowboy activity as long as well tolerated      Balance Screen   Has  the patient fallen in the past 6 months  No    Has the patient had a decrease in activity level because of a fear of falling?   No    Is the patient reluctant to leave their home because of a fear of falling?   No      Home Environment   Living Environment  Private residence    Living Arrangements  Children ages 42 and 44    Type of Home  Other(Comment) townhome    Home Layout  Two level says he jogs up the stairs      Prior Function   Level of Independence  Independent    Leisure  since CABG surgery, has been doing a combo walk/jog (walk 3 miles/jog 1/2 mile) daily.  He uses perceived exertion ("my body tells me") to determine how much to do.      Cognition    Overall Cognitive Status  Within Functional Limits for tasks assessed      Observation/Other Assessments   Observations  well looking 47 year-old man who appears energetic      Functional Tests   Functional tests  Sit to Stand      Sit to Stand   Comments  23 times in 30 seconds, which is excellent/above average for his age with moderate dyspnea following: had liver biopsy yesterday      Posture/Postural Control   Posture/Postural Control  Postural limitations    Postural Limitations  Rounded Shoulders;Forward head      ROM / Strength   AROM / PROM / Strength  AROM      AROM   Overall AROM Comments  trunk AROM in standing WFL all directions      Ambulation/Gait   Ambulation/Gait  Yes    Ambulation/Gait Assistance  7: Independent      Balance   Balance Assessed  Yes      Dynamic Standing Balance   Dynamic Standing - Comments  reaches forward 15 inches in standing, about average for age             Objective measurements completed on examination: See above findings.              PT Education - 09/07/17 1643    Education provided  Yes    Education Details  energy conservation, CURE article on staying active, "Why exercise?" flyer, posture, breathing, PT info, Prehab exercise class info    Person(s) Educated  Patient;Other (comment) his two close friends, Shanon Brow and Manuela Schwartz    Methods  Explanation;Handout    Comprehension  Verbalized understanding            Lung Clinic Goals - 09/07/17 1650      Patient will be able to verbalize understanding of the benefit of exercise to decrease fatigue.   Status  Achieved      Patient will be able to verbalize the importance of posture.   Status  Achieved      Patient will be able to demonstrate diaphragmatic breathing for improved lung function.   Status  Achieved      Patient will be able to verbalize understanding of the role of physical therapy to prevent functional decline and who to contact if physical  therapy is needed.   Status  Achieved           Plan - 09/07/17 1644    Clinical Impression Statement  This is a young man with a new diagnosis of metastic cancer, probably  adenocarcinoma of the lung. This was found incidentally after emergency CABG surgery 06/19/17. He has been walking and jogging since that surgery, and appears well and energetic today.  He did well with assessments today; he had moderate dyspnea after 30 second sit to stand, but said that resulted from having had a biopsy yesterday of a liver lesion. He does have chronic back pain and other pains, including both hips, but hasn't particularly noticed pain from bony metastases to spine and left hip. Posture is round shoulder and forward head.     History and Personal Factors relevant to plan of care:  recent CABG; 31- and 54- year old children at home with him     Clinical Presentation  Evolving    Clinical Presentation due to:  recent diagnosis of metastatic disease that has not been fully assessed yet; about to start treatment once more is known about the cancer type    Clinical Decision Making  Moderate    Rehab Potential  Good    Clinical Impairments Affecting Rehab Potential  metastic disease including bony mets    PT Frequency  One time visit    PT Treatment/Interventions  Patient/family education    PT Next Visit Plan  No follow-up planned at this time.    PT Home Exercise Plan  Continue to be active as tolerated, without aggravating pain    Recommended Other Services  info given on Prehab exercise class    Consulted and Agree with Plan of Care  Patient       Patient will benefit from skilled therapeutic intervention in order to improve the following deficits and impairments:  Cardiopulmonary status limiting activity, Postural dysfunction  Visit Diagnosis: Primary malignant neoplasm of lung metastatic to other site, unspecified laterality ([place]) - Plan: PT plan of care cert/re-cert  Muscle weakness (generalized)  - Plan: PT plan of care cert/re-cert     Problem List Patient Active Problem List   Diagnosis Date Noted  . S/P CABG x 1 06/21/2017  . Coronary artery disease 06/19/2017  . Hyperlipidemia with target LDL less than 70 06/15/2017  . Stage III chronic kidney disease ([place]) 06/15/2017  . NSTEMI (non-ST elevated myocardial infarction) ([place]) 06/14/2017    SALISBURY,DONNA 09/07/2017, 4:52 PM  [place], [place], [address] Phone: [phone]   Fax:  [phone]  Name: Geron Mulford MRN: [id number] Date of Birth: 1971-06-28  Serafina Royals, PT 09/07/17 4:54 PM

## 2017-09-08 ENCOUNTER — Telehealth: Payer: Self-pay | Admitting: Internal Medicine

## 2017-09-08 NOTE — Telephone Encounter (Signed)
Spoke to patient regarding upcoming February appointments per 2/7 los.

## 2017-09-12 ENCOUNTER — Encounter: Payer: BLUE CROSS/BLUE SHIELD | Admitting: Cardiothoracic Surgery

## 2017-09-12 ENCOUNTER — Encounter: Payer: Self-pay | Admitting: *Deleted

## 2017-09-12 ENCOUNTER — Telehealth: Payer: Self-pay | Admitting: *Deleted

## 2017-09-12 NOTE — Telephone Encounter (Signed)
Oncology Nurse Navigator Documentation  Oncology Nurse Navigator Flowsheets 09/12/2017  Navigator Location [facility]  Navigator Encounter Type Telephone/I followed up on Mr. Blessinger appts. He is not schedule for any MRI's. I called and received a time and date for scans. I then called patient and updated him on appt time and place. He verbalized understanding.   Telephone Outgoing Call  Treatment Phase Pre-Tx/Tx Discussion  Barriers/Navigation Needs Coordination of Care;Education  Education Other  Interventions Coordination of Care;Education  Coordination of Care Appts  Acuity Level 2  Acuity Level 2 Assistance expediting appointments  Time Spent with Patient 65

## 2017-09-12 NOTE — Progress Notes (Signed)
Oncology Nurse Navigator Documentation  Oncology Nurse Navigator Flowsheets 09/12/2017  Navigator Location [facility]  Navigator Encounter Type Other/I followed up with authorization team regarding insurance coverage on scans. All scans have been authorized.   Treatment Phase Pre-Tx/Tx Discussion  Barriers/Navigation Needs Coordination of Care  Interventions Coordination of Care  Coordination of Care Other  Acuity Level 1  Time Spent with Patient 15

## 2017-09-13 ENCOUNTER — Ambulatory Visit (HOSPITAL_COMMUNITY)
Admission: RE | Admit: 2017-09-13 | Discharge: 2017-09-13 | Disposition: A | Discharge status: Home / Self Care | Payer: BLUE CROSS/BLUE SHIELD | Source: Ambulatory Visit | Attending: Radiation Oncology | Admitting: Radiation Oncology

## 2017-09-13 ENCOUNTER — Telehealth: Payer: Self-pay | Admitting: *Deleted

## 2017-09-13 DIAGNOSIS — M47812 Spondylosis without myelopathy or radiculopathy, cervical region: Secondary | ICD-10-CM | POA: Diagnosis not present

## 2017-09-13 DIAGNOSIS — C3491 Malignant neoplasm of unspecified part of right bronchus or lung: Secondary | ICD-10-CM | POA: Insufficient documentation

## 2017-09-13 DIAGNOSIS — C7951 Secondary malignant neoplasm of bone: Secondary | ICD-10-CM | POA: Diagnosis not present

## 2017-09-13 DIAGNOSIS — M5031 Other cervical disc degeneration,  high cervical region: Secondary | ICD-10-CM | POA: Diagnosis not present

## 2017-09-13 MED ORDER — GADOBENATE DIMEGLUMINE 529 MG/ML IV SOLN
20.0000 mL | Freq: Once | INTRAVENOUS | Status: AC | PRN
  Administered 2017-09-13: 20 mL via INTRAVENOUS

## 2017-09-13 NOTE — Telephone Encounter (Signed)
Oncology Nurse Navigator Documentation  Oncology Nurse Navigator Flowsheets 09/13/2017  Navigator Location [facility]  Navigator Encounter Type Telephone;Other/I spoke with Anthony [name] today in the radiology dept. I updated him on my role and his next steps.   I followed up on [name] MR scans and updated Dr. [name]. He states patient needs to be set up with radiation. I contacted Dr. Sondra Come and his nurse to help expedite his appt with Rad Onc. I called patient to updated, he was thankful for the call.   Telephone Outgoing Call  Patient Visit Type Other  Treatment Phase Pre-Tx/Tx Discussion  Barriers/Navigation Needs Education  Education Other  Interventions Education  Education Method Verbal  Acuity Level 2  Time Spent with Patient 73

## 2017-09-14 ENCOUNTER — Ambulatory Visit (HOSPITAL_COMMUNITY): Payer: BLUE CROSS/BLUE SHIELD

## 2017-09-14 ENCOUNTER — Telehealth: Payer: Self-pay | Admitting: Oncology

## 2017-09-14 NOTE — Telephone Encounter (Signed)
Called patient and notified him of [facility] [place] appointment on Thursday, 09/21/17 at 9 am.  He verbalized agreement.

## 2017-09-15 ENCOUNTER — Encounter: Payer: Self-pay | Admitting: *Deleted

## 2017-09-15 ENCOUNTER — Ambulatory Visit (HOSPITAL_COMMUNITY)
Admission: RE | Admit: 2017-09-15 | Discharge: 2017-09-15 | Disposition: A | Discharge status: Home / Self Care | Payer: BLUE CROSS/BLUE SHIELD | Source: Ambulatory Visit | Attending: Internal Medicine | Admitting: Internal Medicine

## 2017-09-15 DIAGNOSIS — C3490 Malignant neoplasm of unspecified part of unspecified bronchus or lung: Secondary | ICD-10-CM | POA: Diagnosis not present

## 2017-09-15 LAB — EPIDERMAL GROWTH FACTOR RECEPTOR (EGFR) MUTATION ANALYSIS

## 2017-09-15 MED ORDER — GADOBENATE DIMEGLUMINE 529 MG/ML IV SOLN
20.0000 mL | Freq: Once | INTRAVENOUS | Status: AC | PRN
  Administered 2017-09-15: 19 mL via INTRAVENOUS

## 2017-09-15 NOTE — Progress Notes (Signed)
Oncology Nurse Navigator Documentation  Oncology Nurse Navigator Flowsheets 09/15/2017  Navigator Location [facility]  Navigator Encounter Type Other/per Dr. [name], I followed up on [name] EGFR results.  Results are still pending and I spoke with lab team.  They will contact [facility] with an update on results. Dr. [name] updated  Treatment Phase Pre-Tx/Tx Discussion  Barriers/Navigation Needs Coordination of Care  Interventions Coordination of Care  Coordination of Care Other  Acuity Level 2  Time Spent with Patient 30

## 2017-09-18 ENCOUNTER — Other Ambulatory Visit: Payer: Self-pay | Admitting: Oncology

## 2017-09-18 DIAGNOSIS — C3491 Malignant neoplasm of unspecified part of right bronchus or lung: Secondary | ICD-10-CM

## 2017-09-19 ENCOUNTER — Encounter: Payer: Self-pay | Admitting: *Deleted

## 2017-09-19 ENCOUNTER — Inpatient Hospital Stay (HOSPITAL_BASED_OUTPATIENT_CLINIC_OR_DEPARTMENT_OTHER): Payer: BLUE CROSS/BLUE SHIELD | Admitting: Oncology

## 2017-09-19 ENCOUNTER — Other Ambulatory Visit: Payer: Self-pay | Admitting: Internal Medicine

## 2017-09-19 ENCOUNTER — Telehealth: Payer: Self-pay | Admitting: Oncology

## 2017-09-19 ENCOUNTER — Inpatient Hospital Stay: Payer: BLUE CROSS/BLUE SHIELD

## 2017-09-19 ENCOUNTER — Encounter: Payer: Self-pay | Admitting: Radiation Oncology

## 2017-09-19 ENCOUNTER — Encounter: Payer: Self-pay | Admitting: Oncology

## 2017-09-19 VITALS — BP 132/82 | HR 90 | Temp 98.7°F | Resp 16

## 2017-09-19 DIAGNOSIS — I252 Old myocardial infarction: Secondary | ICD-10-CM

## 2017-09-19 DIAGNOSIS — C7951 Secondary malignant neoplasm of bone: Secondary | ICD-10-CM | POA: Diagnosis not present

## 2017-09-19 DIAGNOSIS — C7801 Secondary malignant neoplasm of right lung: Secondary | ICD-10-CM

## 2017-09-19 DIAGNOSIS — K219 Gastro-esophageal reflux disease without esophagitis: Secondary | ICD-10-CM | POA: Diagnosis not present

## 2017-09-19 DIAGNOSIS — C3411 Malignant neoplasm of upper lobe, right bronchus or lung: Secondary | ICD-10-CM

## 2017-09-19 DIAGNOSIS — Z5112 Encounter for antineoplastic immunotherapy: Secondary | ICD-10-CM | POA: Insufficient documentation

## 2017-09-19 DIAGNOSIS — Z5111 Encounter for antineoplastic chemotherapy: Secondary | ICD-10-CM | POA: Insufficient documentation

## 2017-09-19 DIAGNOSIS — C787 Secondary malignant neoplasm of liver and intrahepatic bile duct: Secondary | ICD-10-CM

## 2017-09-19 DIAGNOSIS — C3491 Malignant neoplasm of unspecified part of right bronchus or lung: Secondary | ICD-10-CM

## 2017-09-19 DIAGNOSIS — Z79899 Other long term (current) drug therapy: Secondary | ICD-10-CM

## 2017-09-19 LAB — CBC WITH DIFFERENTIAL (CANCER CENTER ONLY)
BASOS ABS: 0.1 10*3/uL (ref 0.0–0.1)
Basophils Relative: 1 %
EOS PCT: 1 %
Eosinophils Absolute: 0.1 10*3/uL (ref 0.0–0.5)
HEMATOCRIT: 45.2 % (ref 38.4–49.9)
Hemoglobin: 15.2 g/dL (ref 13.0–17.1)
LYMPHS PCT: 22 %
Lymphs Abs: 1.9 10*3/uL (ref 0.9–3.3)
MCH: 29.8 pg (ref 27.2–33.4)
MCHC: 33.5 g/dL (ref 32.0–36.0)
MCV: 88.9 fL (ref 79.3–98.0)
MONO ABS: 0.7 10*3/uL (ref 0.1–0.9)
MONOS PCT: 8 %
NEUTROS ABS: 5.9 10*3/uL (ref 1.5–6.5)
Neutrophils Relative %: 68 %
PLATELETS: 268 10*3/uL (ref 140–400)
RBC: 5.09 MIL/uL (ref 4.20–5.82)
RDW: 14.5 % (ref 11.0–14.6)
WBC Count: 8.7 10*3/uL (ref 4.0–10.3)

## 2017-09-19 LAB — CMP (CANCER CENTER ONLY)
ALBUMIN: 4.1 g/dL (ref 3.5–5.0)
ALK PHOS: 71 U/L (ref 40–150)
ALT: 37 U/L (ref 0–55)
AST: 29 U/L (ref 5–34)
Anion gap: 8 (ref 3–11)
BUN: 16 mg/dL (ref 7–26)
CO2: 28 mmol/L (ref 22–29)
Calcium: 9.9 mg/dL (ref 8.4–10.4)
Chloride: 107 mmol/L (ref 98–109)
Creatinine: 1.04 mg/dL (ref 0.70–1.30)
GFR, Est AFR Am: >60 mL/min (ref >60)
GFR, Estimated: >60 mL/min (ref >60)
GLUCOSE: 89 mg/dL (ref 70–140)
POTASSIUM: 4.4 mmol/L (ref 3.5–5.1)
SODIUM: 143 mmol/L (ref 136–145)
Total Bilirubin: 0.7 mg/dL (ref 0.2–1.2)
Total Protein: 7.3 g/dL (ref 6.4–8.3)

## 2017-09-19 MED ORDER — CYANOCOBALAMIN 1000 MCG/ML IJ SOLN
INTRAMUSCULAR | Status: AC
  Filled 2017-09-19: qty 1

## 2017-09-19 MED ORDER — DEXAMETHASONE 4 MG PO TABS: ORAL_TABLET | ORAL | 1 refills | Status: DC

## 2017-09-19 MED ORDER — FOLIC ACID 1 MG PO TABS: 1.0000 mg | ORAL_TABLET | Freq: Every day | ORAL | 2 refills | Status: DC

## 2017-09-19 MED ORDER — CYANOCOBALAMIN 1000 MCG/ML IJ SOLN
1000.0000 ug | Freq: Once | INTRAMUSCULAR | Status: AC
  Administered 2017-09-19: 1000 ug via INTRAMUSCULAR

## 2017-09-19 MED ORDER — PROCHLORPERAZINE MALEATE 10 MG PO TABS: 10.0000 mg | ORAL_TABLET | Freq: Four times a day (QID) | ORAL | 1 refills | Status: DC | PRN

## 2017-09-19 NOTE — Assessment & Plan Note (Addendum)
This is a very pleasant 47 year old white male recently diagnosed with a stage IVB (T1b, N2, M1c) non-small cell lung cancer, adenocarcinoma presented with right upper lobe lung nodule in addition to right hilar and mediastinal lymphadenopathy as well as metastatic liver and bone disease diagnosed in February 2019. The patient had a recent MRI of the brain and blood work testing for EGFR and molecular studies.  He is here to discuss the results and treatment options.  The patient was seen with Dr. [name].  MRI of the brain results were discussed with the patient and his friends which showed no evidence of metastatic disease.  We discussed that his EGFR is negative.  Foundation one molecular testing is pending.  PDL 1 was 5%.  We anticipate that the full foundation 1 results will return on approximately 09/28/2017.  If there are any mutations, we may consider him for targeted therapy.  However, if there are no mutations, recommend that he proceed with systemic chemotherapy plus immunotherapy.  Chemotherapy would consist of carboplatin for an AUC of 5, Alimta 500 mg/m, and Keytruda 200 mg IV given every 3 weeks. Discussed with the patient adverse effects of this treatment including but not limited to alopecia, myelosuppression, nausea and vomiting, peripheral neuropathy, liver or renal dysfunction in addition to the adverse effects of Keytruda including but not limited to immune mediated the skin rash, diarrhea, inflammation of the lung, kidney, liver, thyroid or other endocrine dysfunction. We will arrange for the patient to receive vitamin B 12 injection today. The patient would also receive prescription for Compazine 10 mg by mouth every 6 hours as needed for nausea, Decadron 4 mg by mouth twice a day, the day before, day of and day after the chemotherapy in addition to folic acid 1 mg by mouth daily. The patient will be scheduled for chemotherapy education class. Anticipate that he will begin chemotherapy  on 10/04/2017.  May consider treatment with targeted therapy if he has any molecular changes noted on his foundation one testing.  We will see him back for follow-up on the day of the first cycle of chemotherapy.  The patient will have weekly labs while receiving systemic chemotherapy.  The patient will be seen by radiation oncology tomorrow for radiation to his left hip and C4.  He was advised to call immediately if he has any concerning symptoms in the interval. The patient voices understanding of current disease status and treatment options and is in agreement with the current care plan.  All questions were answered. The patient knows to call the clinic with any problems, questions or concerns. We can certainly see the patient much sooner if necessary.

## 2017-09-19 NOTE — Progress Notes (Signed)
[place] OFFICE PROGRESS NOTE  [name], MD [address] [place] [address]  DIAGNOSIS: Stage IVB (T1b, N2, M1c) non-small cell lung cancer, adenocarcinoma presented with right upper lobe lung nodule in addition to right hilar and mediastinal lymphadenopathy as well as metastatic liver and bone disease diagnosed in February 2019.  PDL 1 5%  PRIOR THERAPY: None.  CURRENT THERAPY: Systemic chemotherapy with carboplatin for an AUC of 5, Alimta 500 mg meter squared, and Keytruda 200 mg IV given every 3 weeks pending the results of foundation one testing.  First dose anticipated on 10/04/2017.  INTERVAL HISTORY: Anthony [name] 47 y.o. male returns for routine follow-up visit accompanied by his 2 friends.  The patient is feeling fine today has no specific complaints except for intermittent pain in his chest when he takes a deep breath.  He denies any fever or chills.  Denies chest pain, shortness of breath, cough, hemoptysis.  Denies nausea, vomiting, constipation, diarrhea.  Denies recent weight loss or night sweats.  The patient had a recent MRI of the brain along with a EGFR molecular studies and is here to discuss the results and treatment options.  MEDICAL HISTORY: Past Medical History:  Diagnosis Date  . Acid reflux   . NSTEMI (non-ST elevated myocardial infarction) ([facility]) 06/13/2017   [name] 06/14/2017  . Tailbone injury since 1993   "cracked"    ALLERGIES:  has No Known Allergies.  MEDICATIONS:  Current Outpatient Medications  Medication Sig Dispense Refill  . aspirin EC 81 MG EC tablet Take 1 tablet (81 mg total) by mouth daily.    Marland Kitchen atorvastatin (LIPITOR) 80 MG tablet Take 1 tablet (80 mg total) by mouth daily at 6 PM. 30 tablet 3  . clopidogrel (PLAVIX) 75 MG tablet Take 1 tablet (75 mg total) by mouth daily. 30 tablet 3  . dexamethasone (DECADRON) 4 MG tablet Take 1 tablet twice a day the day before, day of, and day after each cycle of  chemotherapy. 20 tablet 1  . esomeprazole (NEXIUM) 20 MG capsule Take 20 mg by mouth every other day.     . folic acid (FOLVITE) 1 MG tablet Take 1 tablet (1 mg total) by mouth daily. 30 tablet 2  . lisinopril (PRINIVIL,ZESTRIL) 5 MG tablet Take 1 tablet (5 mg total) by mouth daily. 30 tablet 3  . loratadine (CLARITIN) 10 MG tablet Take 10 mg daily as needed by mouth for allergies.    . metoprolol tartrate (LOPRESSOR) 25 MG tablet Take 0.5 tablets (12.5 mg total) by mouth 2 (two) times daily. 30 tablet 3  . prochlorperazine (COMPAZINE) 10 MG tablet Take 1 tablet (10 mg total) by mouth every 6 (six) hours as needed for nausea or vomiting. 30 tablet 1   No current facility-administered medications for this visit.     SURGICAL HISTORY:  Past Surgical History:  Procedure Laterality Date  . CARDIAC CATHETERIZATION  06/14/2017  . CORONARY ARTERY BYPASS GRAFT N/A 06/19/2017   Procedure: CORONARY ARTERY BYPASS GRAFTING (CABG), OFF PUMP, times one using the left internal mammary artery to LAD;  Surgeon: Grace Isaac, MD;  Location: [facility];  Service: Open Heart Surgery;  Laterality: N/A;  . LEFT HEART CATH AND CORONARY ANGIOGRAPHY N/A 06/14/2017   Procedure: LEFT HEART CATH AND CORONARY ANGIOGRAPHY;  Surgeon: Sherren Mocha, MD;  Location: [facility];  Service: Cardiovascular;  Laterality: N/A;  . TEE WITHOUT CARDIOVERSION N/A 06/19/2017   Procedure: TRANSESOPHAGEAL ECHOCARDIOGRAM (TEE);  Surgeon: Grace Isaac, MD;  Location: [facility];  Service: Open Heart Surgery;  Laterality: N/A;  . TONSILLECTOMY  ~ 1977    REVIEW OF SYSTEMS:   Review of Systems  Constitutional: Negative for appetite change, chills, fatigue, fever and unexpected weight change.  HENT:   Negative for mouth sores, nosebleeds, sore throat and trouble swallowing.   Eyes: Negative for eye problems and icterus.  Respiratory: Negative for cough, hemoptysis, shortness of breath and wheezing.  Positive for pain in his  ribs when he takes a deep breath.  Cardiovascular: Negative for chest pain and leg swelling.  Gastrointestinal: Negative for abdominal pain, constipation, diarrhea, nausea and vomiting.  Genitourinary: Negative for bladder incontinence, difficulty urinating, dysuria, frequency and hematuria.   Musculoskeletal: Negative for back pain, gait problem, neck pain and neck stiffness.  Skin: Negative for itching and rash.  Neurological: Negative for dizziness, extremity weakness, gait problem, headaches, light-headedness and seizures.  Hematological: Negative for adenopathy. Does not bruise/bleed easily.  Psychiatric/Behavioral: Negative for confusion, depression and sleep disturbance. The patient is not nervous/anxious.     PHYSICAL EXAMINATION:  Blood pressure 132/82, pulse 90, temperature 98.7 F (37.1 C), temperature source Oral, resp. rate 16, SpO2 99 %.  ECOG PERFORMANCE STATUS: 1 - Symptomatic but completely ambulatory  Physical Exam  Constitutional: Oriented to person, place, and time and well-developed, well-nourished, and in no distress. No distress.  HENT:  Head: Normocephalic and atraumatic.  Mouth/Throat: Oropharynx is clear and moist. No oropharyngeal exudate.  Eyes: Conjunctivae are normal. Right eye exhibits no discharge. Left eye exhibits no discharge. No scleral icterus.  Neck: Normal range of motion. Neck supple.  Cardiovascular: Normal rate, regular rhythm, normal heart sounds and intact distal pulses.   Pulmonary/Chest: Effort normal and breath sounds normal. No respiratory distress. No wheezes. No rales.  Abdominal: Soft. Bowel sounds are normal. Exhibits no distension and no mass. There is no tenderness.  Musculoskeletal: Normal range of motion. Exhibits no edema.  Lymphadenopathy:    No cervical adenopathy.  Neurological: Alert and oriented to person, place, and time. Exhibits normal muscle tone. Gait normal. Coordination normal.  Skin: Skin is warm and dry. No rash  noted. Not diaphoretic. No erythema. No pallor.  Psychiatric: Mood, memory and judgment normal.  Vitals reviewed.  LABORATORY DATA: Lab Results  Component Value Date   WBC 8.7 09/19/2017   HGB 15.2 09/06/2017   HCT 45.2 09/19/2017   MCV 88.9 09/19/2017   PLT 268 09/19/2017      Chemistry      Component Value Date/Time   NA 143 09/19/2017 0828   NA 141 07/17/2017 1006   K 4.4 09/19/2017 0828   CL 107 09/19/2017 0828   CO2 28 09/19/2017 0828   BUN 16 09/19/2017 0828   BUN 12 07/17/2017 1006   CREATININE 1.04 09/19/2017 0828      Component Value Date/Time   CALCIUM 9.9 09/19/2017 0828   ALKPHOS 71 09/19/2017 0828   AST 29 09/19/2017 0828   ALT 37 09/19/2017 0828   BILITOT 0.7 09/19/2017 0828       RADIOGRAPHIC STUDIES:  Mr Jeri Cos TI Contrast  Result Date: 09/15/2017 CLINICAL DATA:  Lung cancer staging.  Non-small cell lung carcinoma. EXAM: MRI HEAD WITHOUT AND WITH CONTRAST TECHNIQUE: Multiplanar, multiecho pulse sequences of the brain and surrounding structures were obtained without and with intravenous contrast. CONTRAST:  61m MULTIHANCE GADOBENATE DIMEGLUMINE 529 MG/ML IV SOLN COMPARISON:  None. FINDINGS: Brain: The midline structures are normal. There  is no acute infarct or acute hemorrhage. No mass lesion, hydrocephalus, dural abnormality or extra-axial collection. Minimal white matter hyperintensity, nonspecific and commonly seen in asymptomatic patients of this age. No age-advanced or lobar predominant atrophy. No chronic microhemorrhage or superficial siderosis. No contrast-enhancing lesions. Vascular: Major intracranial arterial and venous sinus flow voids are preserved. Skull and upper cervical spine: The visualized skull base, calvarium, upper cervical spine and extracranial soft tissues are normal. Sinuses/Orbits: No fluid levels or advanced mucosal thickening. No mastoid or middle ear effusion. Normal orbits. IMPRESSION: No intracranial metastatic disease.  Electronically Signed   By: Ulyses Jarred M.D.   On: 09/15/2017 18:37   Mr Cervical Spine W Wo Contrast  Result Date: 09/13/2017 CLINICAL DATA:  Metastatic lung cancer with lesion in the C4 vertebra noted on PET-CT. EXAM: MRI CERVICAL SPINE WITHOUT AND WITH CONTRAST TECHNIQUE: Multiplanar and multiecho pulse sequences of the cervical spine, to include the craniocervical junction and cervicothoracic junction, were obtained without and with intravenous contrast. CONTRAST:  49m MULTIHANCE GADOBENATE DIMEGLUMINE 529 MG/ML IV SOLN COMPARISON:  08/29/2017 FINDINGS: Despite efforts by the technologist and patient, motion artifact is present on today's exam and could not be eliminated. This reduces exam sensitivity and specificity. Alignment: No vertebral subluxation is observed. Vertebrae: There is diffuse abnormal enhancement throughout the C4 vertebral body associated with a left posterior 1.2 by 1.9 cm lesion within the vertebral body shown on image 17/10. This measures slightly larger than the lytic lesion seen on PET-CT, but I am including the low signal intensity margin which may represent some marginal sclerosis. There is a suggestion of slight convexity of the contour of the vertebral body for example on image 9/8 with demineralization of the overlying cortex. I do not see a significant amount of epidural tumor at this time. 7 mm focus of enhancement along the left upper T1 vertebral body is immediately adjacent to the costovertebral junction and is considered nonspecific, possibly degenerative rather than necessarily from malignancy. Mildly congenitally short pedicles in the cervical spine. Cord: No significant abnormal spinal cord signal is observed. Posterior Fossa, vertebral arteries, paraspinal tissues: Unremarkable Disc levels: C2-3: Unremarkable. C3-4: Borderline right foraminal stenosis due to facet and uncinate spurring. C4-5: Borderline right foraminal stenosis due to uncinate spurring and short  pedicles. C5-6: No impingement.  Mild uncinate spurring. C6-7: Borderline left foraminal stenosis due to facet and uncinate spurring. Borderline central narrowing of the thecal sac due to central disc protrusion. C7-T1: Borderline central narrowing of the thecal sac due to disc bulge and small central disc protrusion. IMPRESSION: 1. C4 vertebral body metastatic lesion associated with diffuse enhancement of the vertebral body and early posterior bony cortical destruction but without a significant amount of epidural tumor at this time. 2. Small focus of edema and enhancement in the left upper T1 vertebral body adjacent to the costovertebral joint, quite possibly degenerative rather than necessarily indicative of tumor. This may warrant surveillance. 3. Congenitally short cervical pedicles and cervical spondylosis and degenerative disc disease causing borderline impingement at C3-4, C4-5, C6-7, and C7-T1. Electronically Signed   By: WVan ClinesM.D.   On: 09/13/2017 08:59   Mr Liver W Wo Contrast  Result Date: 09/04/2017 CLINICAL DATA:  Hypermetabolic lesion within the liver on PET-CT scan. Lesion concerning for metastatic lung cancer EXAM: MRI ABDOMEN WITHOUT AND WITH CONTRAST TECHNIQUE: Multiplanar multisequence MR imaging of the abdomen was performed both before and after the administration of intravenous contrast. CONTRAST:  122mMULTIHANCE GADOBENATE DIMEGLUMINE 529 MG/ML IV  SOLN COMPARISON:  PET-CT scan 08/29/2017 FINDINGS: Lower chest:  Lung bases are clear. Hepatobiliary: Rim enhancing lesion in the central LEFT hepatic lobe (segment 4A) measures 2.2 x 1.9 cm and corresponds to the hypermetabolic lesion on comparison PET-CT scan. Lesion has continuous peripheral enhancement pattern suggestive of hepatic metastasis. Lesion is hyperintense on T2 weighted imaging which can be seen with hemangiomas (image 12, series 5) however the continuous rim enhancement is most suggestive of metastatic lesion. No  additional hepatic lesions are identified. Within the most inferior aspect of the RIGHT hepatic lobe 10 mm hyperintense lesion on T2 weighted imaging (image 30, series 38) demonstrates delayed enhancement (image 49, series 8 most suggestive of a small hemangioma. Pancreas: Normal pancreatic parenchymal intensity. No ductal dilatation or inflammation. Spleen: Normal spleen. Adrenals/urinary tract: Adrenal glands and kidneys are normal. Stomach/Bowel: Stomach and limited of the small bowel is unremarkable Vascular/Lymphatic: Abdominal aortic normal caliber. No retroperitoneal periportal lymphadenopathy. Musculoskeletal: No aggressive osseous lesion IMPRESSION: 1. Enhancing lesion in the central LEFT hepatic lobe corresponds to hypermetabolic lesion on comparison PET-CT scan and is most consistent with a hepatic metastasis. Recommend ultrasound-guided percutaneous biopsy. 2. Probable small hemangioma within the inferior aspect of the RIGHT hepatic lobe. Electronically Signed   By: Suzy Bouchard M.D.   On: 09/04/2017 20:44   Mr Femur Left W Wo Contrast  Result Date: 09/13/2017 CLINICAL DATA:  Stage IV lung cancer. Metastatic cancer to the liver and bone diagnosed February 2019 on PET scan. EXAM: MR OF THE LEFT LOWER EXTREMITY WITHOUT AND WITH CONTRAST TECHNIQUE: Multiplanar, multisequence MR imaging of the left femur was performed both before and after administration of intravenous contrast. CONTRAST:  20 mL MultiHance COMPARISON:  PET-CT 08/29/2017 FINDINGS: Bones/Joint/Cartilage 14 x 14 x 17 mm heterogeneous bone lesion in the proximal left femoral diaphysis at the level of the lesser trochanter with severe surrounding T2 hyperintensity and enhancement most concerning for metastatic disease with the overall abnormality measuring 2 x 2 x 4 cm and encompassing the entire circumference of the medullary cavity. Some of the surrounding T2 hyperintensity and enhancement likely reflects reactive changes, but when  correlated with recent PET-CT some of the abnormality also is favored to reflect tumor. No cortical destruction or endosteal scalloping. No periostitis. Small ill-defined area of T2 hyperintensity along the medial margin of the ischium with enhancement on postcontrast imaging concerning for metastatic disease. No acute fracture or dislocation. Normal alignment. No joint effusion. Ligaments, Muscles and Tendons Muscles are normal. No muscle edema or muscle atrophy. Small partial tear of the left hamstring origin involving the semi tendinosis and biceps femoris origins. Soft tissue No fluid collection or hematoma.  No soft tissue mass. IMPRESSION: 1. 14 x 14 x 17 mm heterogeneous bone lesion in the proximal left femoral diaphysis at the level of the lesser trochanter with severe surrounding T2 hyperintensity and enhancement most concerning for metastatic disease with the overall abnormality measuring 2 x 2 x 4 cm and encompassing the entire circumference of the medullary cavity. Some of the surrounding T2 hyperintensity and enhancement likely reflects reactive changes, but when correlated with recent PET-CT some of the abnormality also is favored to reflect tumor. No cortical destruction or endosteal scalloping. 2. Small ill-defined area of T2 hyperintensity along the medial margin of the ischium with enhancement on postcontrast imaging concerning for metastatic disease. Electronically Signed   By: Kathreen Devoid   On: 09/13/2017 09:12   Nm Pet Image Initial (pi) Skull Base To Thigh  Result Date:  08/29/2017 CLINICAL DATA:  Initial treatment strategy for solitary pulmonary nodule. EXAM: NUCLEAR MEDICINE PET SKULL BASE TO THIGH TECHNIQUE: 11.7 mCi F-18 FDG was injected intravenously. Full-ring PET imaging was performed from the skull base to thigh after the radiotracer. CT data was obtained and used for attenuation correction and anatomic localization. FASTING BLOOD GLUCOSE:  Value: 42 mg/dl COMPARISON:  CT 07/18/2017  FINDINGS: NECK No hypermetabolic lymph nodes in the neck. CHEST In the medial aspect of the RIGHT upper lobe 2.4 cm nodule is intensely metabolic with SUV max equal 15. Small hypermetabolic RIGHT hilar lymph node is present. Hypermetabolic RIGHT paratracheal lymph node measures 12 mm short axis with SUV max equal 4.6. ABDOMEN/PELVIS Low-density lesion in the central LEFT hepatic lobe measures 2.2 cm (image 98, series 4) and has metabolic activity above background liver activity (SUV max equal 4.9). Normal adrenal glands. No hypermetabolic abdominopelvic lymph nodes. Kidneys normal. Diverticula of the sigmoid colon. Metabolic activity ascending colon is favored benign physiologic. No hypermetabolic abdominopelvic lymph nodes. SKELETON Lesion in the C4 vertebral body of the cervical spine has intense metabolic activity with SUV max equal 4.7 and correlates with a 1.2 cm lytic lesion within the vertebral body. Additional moderate metabolic activity within the proximal LEFT femur at the level of the inferior trochanter. (SUV max equal 4.8). No CT correlation IMPRESSION: 1. Hypermetabolic RIGHT upper lobe pulmonary nodule most consistent prior bronchogenic carcinoma. 2. Hypermetabolic RIGHT hilar lymph node and RIGHT paratracheal lymph node most consistent with nodal metastasis. 3. Single hypermetabolic lesion within the central liver most concerning for hepatic metastasis. 4. Evidence skeletal metastasis to the C4 vertebral body. Suspicion of metastatic lesion within the LEFT intertrochanteric femur. Electronically Signed   By: Suzy Bouchard M.D.   On: 08/29/2017 17:09   US Biopsy (liver)  Result Date: 09/06/2017 CLINICAL DATA:  2.4 cm right upper lobe lung mass with associated hypermetabolic right paratracheal metastatic lymphadenopathy, left lobe liver metastasis and hypermetabolic bone metastases by PET scan. Additional MRI of the abdomen demonstrates the liver lesion in segment 4A of the left lobe measuring  approximately 2.2 cm and having characteristics consistent with a probable metastatic lesion. The patient presents for biopsy of the liver lesion. EXAM: ULTRASOUND GUIDED CORE BIOPSY OF LIVER MEDICATIONS: 3.5 mg IV Versed; 150 mcg IV Fentanyl Total Moderate Sedation Time: 28 minutes. The patient's level of consciousness and physiologic status were continuously monitored during the procedure by Radiology nursing. PROCEDURE: The procedure, risks, benefits, and alternatives were explained to the patient. Questions regarding the procedure were encouraged and answered. The patient understands and consents to the procedure. A time out was performed prior to initiating the procedure. The abdominal wall was prepped with chlorhexidine in a sterile fashion, and a sterile drape was applied covering the operative field. A sterile gown and sterile gloves were used for the procedure. Local anesthesia was provided with 1% Lidocaine. Ultrasound was used to localize a lesion within the left lobe of the liver. Under direct ultrasound guidance, a 17 gauge needle was advanced to the anterior margin of the lesion. Two separate coaxial 18 gauge core biopsy samples were obtained through the lesion. Core biopsy samples were submitted in formalin. Gel-Foam pledgets were advanced through the outer needle as the needle was retracted. COMPLICATIONS: None. FINDINGS: Oval-shaped hypoechoic mass is identified in the left lobe of the liver. The surrounding liver parenchyma is echogenic, consistent with steatosis. The lesion measures approximately 2.0 x 2.6 cm in greatest dimensions by ultrasound. Solid tissue was obtained.  IMPRESSION: Ultrasound-guided core biopsy performed of a lesion in the left lobe of the liver measuring 2.6 cm in greatest diameter by ultrasound. Electronically Signed   By: Aletta Edouard M.D.   On: 09/06/2017 10:21     ASSESSMENT/PLAN:  Stage 4 lung cancer, right [facility]) This is a very pleasant 47 year old white male  recently diagnosed with a stage IVB (T1b, N2, M1c) non-small cell lung cancer, adenocarcinoma presented with right upper lobe lung nodule in addition to right hilar and mediastinal lymphadenopathy as well as metastatic liver and bone disease diagnosed in February 2019. The patient had a recent MRI of the brain and blood work testing for EGFR and molecular studies.  He is here to discuss the results and treatment options.  The patient was seen with Dr. [name].  MRI of the brain results were discussed with the patient and his friends which showed no evidence of metastatic disease.  We discussed that his EGFR is negative.  Foundation one molecular testing is pending.  PDL 1 was 5%.  We anticipate that the full foundation 1 results will return on approximately 09/28/2017.  If there are any mutations, we may consider him for targeted therapy.  However, if there are no mutations, recommend that he proceed with systemic chemotherapy plus immunotherapy.  Chemotherapy would consist of carboplatin for an AUC of 5, Alimta 500 mg/m, and Keytruda 200 mg IV given every 3 weeks. Discussed with the patient adverse effects of this treatment including but not limited to alopecia, myelosuppression, nausea and vomiting, peripheral neuropathy, liver or renal dysfunction in addition to the adverse effects of  Keytruda including but not limited to immune mediated the skin rash, diarrhea, inflammation of the lung, kidney, liver, thyroid or other endocrine dysfunction. We will arrange for the patient to receive vitamin B 12 injection today. The patient would also receive prescription for Compazine 10 mg by mouth every 6 hours as needed for nausea, Decadron 4 mg by mouth twice a day, the day before, day of and day after the chemotherapy in addition to folic acid 1 mg by mouth daily. The patient will be scheduled for chemotherapy education class. Anticipate that he will begin chemotherapy on 10/04/2017.  May consider treatment with  targeted therapy if he has any molecular changes noted on his foundation one testing.  We will see him back for follow-up on the day of the first cycle of chemotherapy.  The patient will have weekly labs while receiving systemic chemotherapy.  The patient will be seen by radiation oncology tomorrow for radiation to his left hip and C4.  He was advised to call immediately if he has any concerning symptoms in the interval. The patient voices understanding of current disease status and treatment options and is in agreement with the current care plan.  All questions were answered. The patient knows to call the clinic with any problems, questions or concerns. We can certainly see the patient much sooner if necessary.   Orders Placed This Encounter  Procedures  . CBC with Differential (Cancer Center Only)    Standing Status:   Standing    Number of Occurrences:   20    Standing Expiration Date:   09/19/2018  . CMP ([place] only)    Standing Status:   Standing    Number of Occurrences:   20    Standing Expiration Date:   09/19/2018  . TSH    Standing Status:   Standing    Number of Occurrences:  20    Standing Expiration Date:   09/19/2018    Mikey Bussing, DNP, AGPCNP-BC, AOCNP 09/19/17  ADDENDUM: Hematology/Oncology Attending: I had a face-to-face encounter with the patient.  I recommended his care plan.  This is a very pleasant 47 years old white male recently diagnosed with metastatic non-small cell lung cancer, adenocarcinoma with liver and bone metastasis.  The patient had MRI of the brain performed recently that showed no evidence of metastatic disease to the brain.  He also had MRI of the cervical spine and left femur that showed the previously known metastatic bone disease.  He will start palliative radiotherapy under the care of Dr. Sondra Come to these areas. Blood test for EGFR mutation was reported to be negative.  The molecular studies from his tissue biopsy are still  pending.  These are expected to be reported September 28, 2017.  PDL 1 expression was 20%. I had a lengthy discussion with the patient today about his condition and treatment options. The patient was given the option of palliative care versus consideration of treatment with systemic chemotherapy with carboplatin, Alimta and Keytruda every 3 weeks versus waiting for the molecular studies for consideration of treatment with targeted therapy if he has any actionable mutations. The patient agreed to wait for the molecular studies to become available but he would like to schedule chemotherapy in case the molecular studies are negative. I will arrange for him to start the first cycle of systemic chemotherapy on October 03, 2017 if he has no actionable mutations. I discussed with the patient the adverse effect of the chemotherapy including but not limited to alopecia, myelosuppression, nausea and vomiting, peripheral neuropathy, liver or renal dysfunction in addition to the immunotherapy adverse effects.  The patient will receive vitamin B12 injection today and he was given prescription for folic acid, Compazine and Decadron. He was advised to call immediately if he has any concerning symptoms in the interval.  Disclaimer: This note was dictated with voice recognition software. Similar sounding words can inadvertently be transcribed and may be missed upon review. Eilleen Kempf, MD 09/20/17

## 2017-09-19 NOTE — Progress Notes (Signed)
START ON PATHWAY REGIMEN - Non-Small Cell Lung     A cycle is every 21 days:     Pembrolizumab      Pemetrexed      Carboplatin   **Always confirm dose/schedule in your pharmacy ordering system**    Patient Characteristics: Stage IV Metastatic, Nonsquamous, Initial Chemotherapy/Immunotherapy, PS = 0, 1, PD-L1 Expression Positive 1-49% (TPS) / Negative / Not Tested / Awaiting Test Results AJCC T Category: T1b Current Disease Status: Distant Metastases AJCC N Category: N2 AJCC M Category: M1c AJCC 8 Stage Grouping: IVB Histology: Nonsquamous Cell ROS1 Rearrangement Status: Awaiting Test Results T790M Mutation Status: Not Applicable - EGFR Mutation Negative/Unknown Other Mutations/Biomarkers: No Other Actionable Mutations PD-L1 Expression Status: PD-L1 Positive 1-49% (TPS) Chemotherapy/Immunotherapy LOT: Initial Chemotherapy/Immunotherapy Molecular Targeted Therapy: Not Appropriate ALK Translocation Status: Awaiting Test Results Would you be surprised if this patient died  in the next year<= I would NOT be surprised if this patient died in the next year EGFR Mutation Status: Awaiting Test Results BRAF V600E Mutation Status: Awaiting Test Results Performance Status: PS = 0, 1 Intent of Therapy: Non-Curative / Palliative Intent, Discussed with Patient

## 2017-09-19 NOTE — Telephone Encounter (Signed)
Scheduled appt per 2/19 los - Gave patient aVS And calender per los.

## 2017-09-19 NOTE — Patient Instructions (Signed)
Carboplatin injection What is this medicine? CARBOPLATIN (KAR boe pla tin) is a chemotherapy drug. It targets fast dividing cells, like cancer cells, and causes these cells to die. This medicine is used to treat ovarian cancer and many other cancers. This medicine may be used for other purposes; ask your health care provider or pharmacist if you have questions. COMMON BRAND NAME(S): Paraplatin What should I tell my health care provider before I take this medicine? They need to know if you have any of these conditions: -blood disorders -hearing problems -kidney disease -recent or ongoing radiation therapy -an unusual or allergic reaction to carboplatin, cisplatin, other chemotherapy, other medicines, foods, dyes, or preservatives -pregnant or trying to get pregnant -breast-feeding How should I use this medicine? This drug is usually given as an infusion into a vein. It is administered in a hospital or clinic by a specially trained health care professional. Talk to your pediatrician regarding the use of this medicine in children. Special care may be needed. Overdosage: If you think you have taken too much of this medicine contact a poison control center or emergency room at once. NOTE: This medicine is only for you. Do not share this medicine with others. What if I miss a dose? It is important not to miss a dose. Call your doctor or health care professional if you are unable to keep an appointment. What may interact with this medicine? -medicines for seizures -medicines to increase blood counts like filgrastim, pegfilgrastim, sargramostim -some antibiotics like amikacin, gentamicin, neomycin, streptomycin, tobramycin -vaccines Talk to your doctor or health care professional before taking any of these medicines: -acetaminophen -aspirin -ibuprofen -ketoprofen -naproxen This list may not describe all possible interactions. Give your health care provider a list of all the medicines, herbs,  non-prescription drugs, or dietary supplements you use. Also tell them if you smoke, drink alcohol, or use illegal drugs. Some items may interact with your medicine. What should I watch for while using this medicine? Your condition will be monitored carefully while you are receiving this medicine. You will need important blood work done while you are taking this medicine. This drug may make you feel generally unwell. This is not uncommon, as chemotherapy can affect healthy cells as well as cancer cells. Report any side effects. Continue your course of treatment even though you feel ill unless your doctor tells you to stop. In some cases, you may be given additional medicines to help with side effects. Follow all directions for their use. Call your doctor or health care professional for advice if you get a fever, chills or sore throat, or other symptoms of a cold or flu. Do not treat yourself. This drug decreases your body's ability to fight infections. Try to avoid being around people who are sick. This medicine may increase your risk to bruise or bleed. Call your doctor or health care professional if you notice any unusual bleeding. Be careful brushing and flossing your teeth or using a toothpick because you may get an infection or bleed more easily. If you have any dental work done, tell your dentist you are receiving this medicine. Avoid taking products that contain aspirin, acetaminophen, ibuprofen, naproxen, or ketoprofen unless instructed by your doctor. These medicines may hide a fever. Do not become pregnant while taking this medicine. Women should inform their doctor if they wish to become pregnant or think they might be pregnant. There is a potential for serious side effects to an unborn child. Talk to your health care professional or  pharmacist for more information. Do not breast-feed an infant while taking this medicine. What side effects may I notice from receiving this medicine? Side effects  that you should report to your doctor or health care professional as soon as possible: -allergic reactions like skin rash, itching or hives, swelling of the face, lips, or tongue -signs of infection - fever or chills, cough, sore throat, pain or difficulty passing urine -signs of decreased platelets or bleeding - bruising, pinpoint red spots on the skin, black, tarry stools, nosebleeds -signs of decreased red blood cells - unusually weak or tired, fainting spells, lightheadedness -breathing problems -changes in hearing -changes in vision -chest pain -high blood pressure -low blood counts - This drug may decrease the number of white blood cells, red blood cells and platelets. You may be at increased risk for infections and bleeding. -nausea and vomiting -pain, swelling, redness or irritation at the injection site -pain, tingling, numbness in the hands or feet -problems with balance, talking, walking -trouble passing urine or change in the amount of urine Side effects that usually do not require medical attention (report to your doctor or health care professional if they continue or are bothersome): -hair loss -loss of appetite -metallic taste in the mouth or changes in taste This list may not describe all possible side effects. Call your doctor for medical advice about side effects. You may report side effects to FDA at 1-800-FDA-1088. Where should I keep my medicine? This drug is given in a hospital or clinic and will not be stored at home. NOTE: This sheet is a summary. It may not cover all possible information. If you have questions about this medicine, talk to your doctor, pharmacist, or health care provider.  2018 Elsevier/Gold Standard (2007-10-23 14:38:05)  Pemetrexed injection What is this medicine? PEMETREXED (PEM e TREX ed) is a chemotherapy drug used to treat lung cancers like non-small cell lung cancer and mesothelioma. It may also be used to treat other cancers. This  medicine may be used for other purposes; ask your health care provider or pharmacist if you have questions. COMMON BRAND NAME(S): Alimta What should I tell my health care provider before I take this medicine? They need to know if you have any of these conditions: -infection (especially a virus infection such as chickenpox, cold sores, or herpes) -kidney disease -low blood counts, like low white cell, platelet, or red cell counts -lung or breathing disease, like asthma -radiation therapy -an unusual or allergic reaction to pemetrexed, other medicines, foods, dyes, or preservative -pregnant or trying to get pregnant -breast-feeding How should I use this medicine? This drug is given as an infusion into a vein. It is administered in a hospital or clinic by a specially trained health care professional. Talk to your pediatrician regarding the use of this medicine in children. Special care may be needed. Overdosage: If you think you have taken too much of this medicine contact a poison control center or emergency room at once. NOTE: This medicine is only for you. Do not share this medicine with others. What if I miss a dose? It is important not to miss your dose. Call your doctor or health care professional if you are unable to keep an appointment. What may interact with this medicine? This medicine may interact with the following medications: -Ibuprofen This list may not describe all possible interactions. Give your health care provider a list of all the medicines, herbs, non-prescription drugs, or dietary supplements you use. Also tell them if you  smoke, drink alcohol, or use illegal drugs. Some items may interact with your medicine. What should I watch for while using this medicine? Visit your doctor for checks on your progress. This drug may make you feel generally unwell. This is not uncommon, as chemotherapy can affect healthy cells as well as cancer cells. Report any side effects. Continue  your course of treatment even though you feel ill unless your doctor tells you to stop. In some cases, you may be given additional medicines to help with side effects. Follow all directions for their use. Call your doctor or health care professional for advice if you get a fever, chills or sore throat, or other symptoms of a cold or flu. Do not treat yourself. This drug decreases your body's ability to fight infections. Try to avoid being around people who are sick. This medicine may increase your risk to bruise or bleed. Call your doctor or health care professional if you notice any unusual bleeding. Be careful brushing and flossing your teeth or using a toothpick because you may get an infection or bleed more easily. If you have any dental work done, tell your dentist you are receiving this medicine. Avoid taking products that contain aspirin, acetaminophen, ibuprofen, naproxen, or ketoprofen unless instructed by your doctor. These medicines may hide a fever. Call your doctor or health care professional if you get diarrhea or mouth sores. Do not treat yourself. To protect your kidneys, drink water or other fluids as directed while you are taking this medicine. Do not become pregnant while taking this medicine or for 6 months after stopping it. Women should inform their doctor if they wish to become pregnant or think they might be pregnant. Men should not father a child while taking this medicine and for 3 months after stopping it. This may interfere with the ability to father a child. You should talk to your doctor or health care professional if you are concerned about your fertility. There is a potential for serious side effects to an unborn child. Talk to your health care professional or pharmacist for more information. Do not breast-feed an infant while taking this medicine or for 1 week after stopping it. What side effects may I notice from receiving this medicine? Side effects that you should report  to your doctor or health care professional as soon as possible: -allergic reactions like skin rash, itching or hives, swelling of the face, lips, or tongue -breathing problems -redness, blistering, peeling or loosening of the skin, including inside the mouth -signs and symptoms of bleeding such as bloody or black, tarry stools; red or dark-brown urine; spitting up blood or brown material that looks like coffee grounds; red spots on the skin; unusual bruising or bleeding from the eye, gums, or nose -signs and symptoms of infection like fever or chills; cough; sore throat; pain or trouble passing urine -signs and symptoms of kidney injury like trouble passing urine or change in the amount of urine -signs and symptoms of liver injury like dark yellow or brown urine; general ill feeling or flu-like symptoms; light-colored stools; loss of appetite; nausea; right upper belly pain; unusually weak or tired; yellowing of the eyes or skin Side effects that usually do not require medical attention (report to your doctor or health care professional if they continue or are bothersome): -constipation -dizziness -mouth sores -nausea, vomiting -pain, tingling, numbness in the hands or feet -unusually weak or tired This list may not describe all possible side effects. Call your doctor for  medical advice about side effects. You may report side effects to FDA at 1-800-FDA-1088. Where should I keep my medicine? This drug is given in a hospital or clinic and will not be stored at home. NOTE: This sheet is a summary. It may not cover all possible information. If you have questions about this medicine, talk to your doctor, pharmacist, or health care provider.  2018 Elsevier/Gold Standard (2016-05-17 18:51:46)  Pembrolizumab injection What is this medicine? PEMBROLIZUMAB (pem broe liz ue mab) is a monoclonal antibody. It is used to treat melanoma, head and neck cancer, Hodgkin lymphoma, non-small cell lung cancer,  urothelial cancer, stomach cancer, and cancers that have a certain genetic condition. This medicine may be used for other purposes; ask your health care provider or pharmacist if you have questions. COMMON BRAND NAME(S): Keytruda What should I tell my health care provider before I take this medicine? They need to know if you have any of these conditions: -diabetes -immune system problems -inflammatory bowel disease -liver disease -lung or breathing disease -lupus -organ transplant -an unusual or allergic reaction to pembrolizumab, other medicines, foods, dyes, or preservatives -pregnant or trying to get pregnant -breast-feeding How should I use this medicine? This medicine is for infusion into a vein. It is given by a health care professional in a hospital or clinic setting. A special MedGuide will be given to you before each treatment. Be sure to read this information carefully each time. Talk to your pediatrician regarding the use of this medicine in children. While this drug may be prescribed for selected conditions, precautions do apply. Overdosage: If you think you have taken too much of this medicine contact a poison control center or emergency room at once. NOTE: This medicine is only for you. Do not share this medicine with others. What if I miss a dose? It is important not to miss your dose. Call your doctor or health care professional if you are unable to keep an appointment. What may interact with this medicine? Interactions have not been studied. Give your health care provider a list of all the medicines, herbs, non-prescription drugs, or dietary supplements you use. Also tell them if you smoke, drink alcohol, or use illegal drugs. Some items may interact with your medicine. This list may not describe all possible interactions. Give your health care provider a list of all the medicines, herbs, non-prescription drugs, or dietary supplements you use. Also tell them if you smoke,  drink alcohol, or use illegal drugs. Some items may interact with your medicine. What should I watch for while using this medicine? Your condition will be monitored carefully while you are receiving this medicine. You may need blood work done while you are taking this medicine. Do not become pregnant while taking this medicine or for 4 months after stopping it. Women should inform their doctor if they wish to become pregnant or think they might be pregnant. There is a potential for serious side effects to an unborn child. Talk to your health care professional or pharmacist for more information. Do not breast-feed an infant while taking this medicine or for 4 months after the last dose. What side effects may I notice from receiving this medicine? Side effects that you should report to your doctor or health care professional as soon as possible: -allergic reactions like skin rash, itching or hives, swelling of the face, lips, or tongue -bloody or black, tarry -breathing problems -changes in vision -chest pain -chills -constipation -cough -dizziness or feeling faint or lightheaded -  fast or irregular heartbeat -fever -flushing -hair loss -low blood counts - this medicine may decrease the number of white blood cells, red blood cells and platelets. You may be at increased risk for infections and bleeding. -muscle pain -muscle weakness -persistent headache -signs and symptoms of high blood sugar such as dizziness; dry mouth; dry skin; fruity breath; nausea; stomach pain; increased hunger or thirst; increased urination -signs and symptoms of kidney injury like trouble passing urine or change in the amount of urine -signs and symptoms of liver injury like dark urine, light-colored stools, loss of appetite, nausea, right upper belly pain, yellowing of the eyes or skin -stomach pain -sweating -weight loss Side effects that usually do not require medical attention (report to your doctor or health  care professional if they continue or are bothersome): -decreased appetite -diarrhea -tiredness This list may not describe all possible side effects. Call your doctor for medical advice about side effects. You may report side effects to FDA at 1-800-FDA-1088. Where should I keep my medicine? This drug is given in a hospital or clinic and will not be stored at home. NOTE: This sheet is a summary. It may not cover all possible information. If you have questions about this medicine, talk to your doctor, pharmacist, or health care provider.  2018 Elsevier/Gold Standard (2016-04-26 12:29:36)

## 2017-09-19 NOTE — Progress Notes (Signed)
Oncology Nurse Navigator Documentation  Oncology Nurse Navigator Flowsheets 09/19/2017  Navigator Location [facility]  Navigator Encounter Type Other/I called [facility] to check on test results. They will be completed on 09/28/17 but PDL 1 is completed. They will fax to me. I updated Dr. [name] on results.   Patient Visit Type MedOnc  Treatment Phase Pre-Tx/Tx Discussion  Barriers/Navigation Needs Coordination of Care  Interventions Coordination of Care  Coordination of Care Other  Acuity Level 2  Time Spent with Patient 30

## 2017-09-20 ENCOUNTER — Ambulatory Visit
Admission: RE | Admit: 2017-09-20 | Discharge: 2017-09-20 | Disposition: A | Discharge status: Home / Self Care | Payer: BLUE CROSS/BLUE SHIELD | Source: Ambulatory Visit | Attending: Radiation Oncology | Admitting: Radiation Oncology

## 2017-09-20 ENCOUNTER — Other Ambulatory Visit: Payer: Self-pay

## 2017-09-20 ENCOUNTER — Encounter: Payer: Self-pay | Admitting: Radiation Oncology

## 2017-09-20 DIAGNOSIS — G629 Polyneuropathy, unspecified: Secondary | ICD-10-CM | POA: Insufficient documentation

## 2017-09-20 DIAGNOSIS — Z79899 Other long term (current) drug therapy: Secondary | ICD-10-CM | POA: Diagnosis not present

## 2017-09-20 DIAGNOSIS — R51 Headache: Secondary | ICD-10-CM | POA: Diagnosis not present

## 2017-09-20 DIAGNOSIS — C7951 Secondary malignant neoplasm of bone: Secondary | ICD-10-CM | POA: Insufficient documentation

## 2017-09-20 DIAGNOSIS — M47812 Spondylosis without myelopathy or radiculopathy, cervical region: Secondary | ICD-10-CM | POA: Insufficient documentation

## 2017-09-20 DIAGNOSIS — C3491 Malignant neoplasm of unspecified part of right bronchus or lung: Secondary | ICD-10-CM

## 2017-09-20 DIAGNOSIS — C787 Secondary malignant neoplasm of liver and intrahepatic bile duct: Secondary | ICD-10-CM | POA: Diagnosis not present

## 2017-09-20 DIAGNOSIS — Z8781 Personal history of (healed) traumatic fracture: Secondary | ICD-10-CM | POA: Diagnosis not present

## 2017-09-20 DIAGNOSIS — R509 Fever, unspecified: Secondary | ICD-10-CM | POA: Insufficient documentation

## 2017-09-20 DIAGNOSIS — C771 Secondary and unspecified malignant neoplasm of intrathoracic lymph nodes: Secondary | ICD-10-CM | POA: Diagnosis not present

## 2017-09-20 DIAGNOSIS — C3411 Malignant neoplasm of upper lobe, right bronchus or lung: Secondary | ICD-10-CM | POA: Diagnosis present

## 2017-09-20 DIAGNOSIS — R634 Abnormal weight loss: Secondary | ICD-10-CM | POA: Diagnosis not present

## 2017-09-20 DIAGNOSIS — Z7982 Long term (current) use of aspirin: Secondary | ICD-10-CM | POA: Diagnosis not present

## 2017-09-20 DIAGNOSIS — K219 Gastro-esophageal reflux disease without esophagitis: Secondary | ICD-10-CM | POA: Diagnosis not present

## 2017-09-20 DIAGNOSIS — Z510 Encounter for antineoplastic radiation therapy: Secondary | ICD-10-CM | POA: Diagnosis present

## 2017-09-20 DIAGNOSIS — I252 Old myocardial infarction: Secondary | ICD-10-CM | POA: Diagnosis not present

## 2017-09-20 HISTORY — DX: Malignant neoplasm of unspecified part of unspecified bronchus or lung: C34.90

## 2017-09-20 NOTE — Progress Notes (Signed)
  Radiation Oncology         ([phone]) [phone] ________________________________  Name: Scot Shiraishi MRN: [id number]  Date: 09/20/2017  DOB: [birth year]  SIMULATION AND TREATMENT PLANNING NOTE    ICD-10-CM   1. 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  Identity was confirmed.  All relevant records and images related to the planned course of therapy were reviewed.  The patient freely provided informed written consent to proceed with treatment after reviewing the details related to the planned course of therapy. The consent form was witnessed and verified by the simulation staff.  Then, the patient was set-up in a stable reproducible  supine position for radiation therapy.  CT images were obtained.  Surface markings were placed.  The CT images were loaded into the planning software.  Then the target and avoidance structures were contoured.  Treatment planning then occurred.  The radiation prescription was entered and confirmed.  Then, I designed and supervised the construction of a total of 6 medically necessary complex treatment devices.  I have requested : Isodose Plan.  I have ordered:dose calc.  PLAN:  The patient will receive 30 Gy in 10 fractions directed at the C4 cervical spine and the left proximal femur.  -----------------------------------  Blair Promise, PhD, MD

## 2017-09-20 NOTE — Progress Notes (Signed)
Radiation Oncology         [phone] ________________________________  Name: Anthony [name] MRN: [id number]  Date: 09/20/2017  DOB: 08-Jul-1971  Follow-Up Visit Note  CC: [name], MD  Grace Isaac, MD    ICD-10-CM   1. 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 The patient was originally seen for consultation in our multidisciplinary lung clinic on 09/07/2017. Since that time the patient underwent additional MRI imaging studies.   Cervical spine MRI on Oct 13, 2017 showed a C4 vertebral body metastatic lesion associated with diffuse enhancement of the vertebral body and early posterior bony cortical destruction but without a significant amount of epidural tumor at this time. Also seen was a small focus of edema and enhancement in the left upper T1 vertebral body adjacent to the costovertebral joint, quite possibly degenerative rather than necessarily indicative of tumor.  Left femur MRI on October 13, 2017 showed a 14 x 14 x 17 mm heterogeneous bone lesion in the proximal left femoral diaphysis at the level of the lesser trochanter with severe surrounding T2 hyperintensity and enhancement most concerning for metastatic disease with the overall abnormality measuring 2 x 2 x 4 cm and encompassing the entire circumference of the medullary cavity. Some of the surrounding T2 hyperintensity and enhancement likely reactive changes, but when correlated with recent PET-CT some of the abnormality also is favored to reflect tumor. No cortical destruction or endosteal scalloping. Also seen was a small ill-defined area of T2 hyperintensity along the medial margin of the ischium with enhancement on post contrast imaging concerning for metastatic disease.  Brain MRI on 09/15/2017 showed no intracranial  metastatic disease.  Anticipated chemotherapy with medical oncologist, Dr. [name], includes systemic chemotherapy with carboplatin for an AUC of 5, Alimta 500 mg meter squared, and Keytruda 200 mg IV given every 3 weeks pending the results of foundation one testing. First dose anticipated on 10/04/2017.  On review of systems, the patient reports that he is doing well overall. He reports having a sore neck which started about 3 weeks ago and in his left hip when sitting. He denies bowel/bladder retention or incontinence. He reports his feet and hands go numb when laying down. He has also noticed that his back "tightens up" with walking. He is not currently taking Decadron. Of note, the patient's temperature is elevated today at 99.46F. He said he feels like he is getting a cold. A complete review of systems is obtained and is otherwise negative.                         ALLERGIES:  has No Known Allergies.  Meds: Current Outpatient Medications  Medication Sig Dispense Refill  . aspirin EC 81 MG EC tablet Take 1 tablet (81 mg total) by mouth daily.    Marland Kitchen atorvastatin (LIPITOR) 80 MG tablet Take 1 tablet (80 mg total) by mouth daily at 6 PM. 30 tablet 3  . clopidogrel (PLAVIX) 75 MG tablet Take 1 tablet (75 mg total) by mouth daily. 30 tablet 3  . dexamethasone (DECADRON) 4 MG tablet Take 1 tablet twice a day the day before, day of, and day after each cycle of chemotherapy. 20 tablet 1  . esomeprazole (NEXIUM) 20 MG capsule Take 20 mg by mouth  every other day.     . folic acid (FOLVITE) 1 MG tablet Take 1 tablet (1 mg total) by mouth daily. 30 tablet 2  . lisinopril (PRINIVIL,ZESTRIL) 5 MG tablet Take 1 tablet (5 mg total) by mouth daily. 30 tablet 3  . metoprolol tartrate (LOPRESSOR) 25 MG tablet Take 0.5 tablets (12.5 mg total) by mouth 2 (two) times daily. 30 tablet 3  . loratadine (CLARITIN) 10 MG tablet Take 10 mg daily as needed by mouth for allergies.    Marland Kitchen prochlorperazine (COMPAZINE) 10 MG  tablet Take 1 tablet (10 mg total) by mouth every 6 (six) hours as needed for nausea or vomiting. (Patient not taking: Reported on 09/20/2017) 30 tablet 1   No current facility-administered medications for this encounter.     Physical Findings:  height is 5\' 10"  (1.778 m) and weight is 207 lb 9.6 oz (94.2 kg). His oral temperature is 99.9 F (37.7 C). His blood pressure is 121/78 and his pulse is 85. His oxygen saturation is 100%. .   In general this is a well appearing  male in no acute distress. He's alert and oriented x4 and appropriate throughout the examination. Cardiopulmonary assessment is negative for acute distress and he exhibits normal effort.   Lab Findings: Lab Results  Component Value Date   WBC 8.7 09/19/2017   HGB 15.2 09/06/2017   HCT 45.2 09/19/2017   MCV 88.9 09/19/2017   PLT 268 09/19/2017    Radiographic Findings: Mr Jeri Cos KG Contrast  Result Date: 09/15/2017 CLINICAL DATA:  Lung cancer staging.  Non-small cell lung carcinoma. EXAM: MRI HEAD WITHOUT AND WITH CONTRAST TECHNIQUE: Multiplanar, multiecho pulse sequences of the brain and surrounding structures were obtained without and with intravenous contrast. CONTRAST:  29mL MULTIHANCE GADOBENATE DIMEGLUMINE 529 MG/ML IV SOLN COMPARISON:  None. FINDINGS: Brain: The midline structures are normal. There is no acute infarct or acute hemorrhage. No mass lesion, hydrocephalus, dural abnormality or extra-axial collection. Minimal white matter hyperintensity, nonspecific and commonly seen in asymptomatic patients of this age. No age-advanced or lobar predominant atrophy. No chronic microhemorrhage or superficial siderosis. No contrast-enhancing lesions. Vascular: Major intracranial arterial and venous sinus flow voids are preserved. Skull and upper cervical spine: The visualized skull base, calvarium, upper cervical spine and extracranial soft tissues are normal. Sinuses/Orbits: No fluid levels or advanced mucosal thickening. No  mastoid or middle ear effusion. Normal orbits. IMPRESSION: No intracranial metastatic disease. Electronically Signed   By: Ulyses Jarred M.D.   On: 09/15/2017 18:37   Mr Cervical Spine W Wo Contrast  Result Date: 09/13/2017 CLINICAL DATA:  Metastatic lung cancer with lesion in the C4 vertebra noted on PET-CT. EXAM: MRI CERVICAL SPINE WITHOUT AND WITH CONTRAST TECHNIQUE: Multiplanar and multiecho pulse sequences of the cervical spine, to include the craniocervical junction and cervicothoracic junction, were obtained without and with intravenous contrast. CONTRAST:  71mL MULTIHANCE GADOBENATE DIMEGLUMINE 529 MG/ML IV SOLN COMPARISON:  08/29/2017 FINDINGS: Despite efforts by the technologist and patient, motion artifact is present on today's exam and could not be eliminated. This reduces exam sensitivity and specificity. Alignment: No vertebral subluxation is observed. Vertebrae: There is diffuse abnormal enhancement throughout the C4 vertebral body associated with a left posterior 1.2 by 1.9 cm lesion within the vertebral body shown on image 17/10. This measures slightly larger than the lytic lesion seen on PET-CT, but I am including the low signal intensity margin which may represent some marginal sclerosis. There is a suggestion of slight convexity of the  contour of the vertebral body for example on image 9/8 with demineralization of the overlying cortex. I do not see a significant amount of epidural tumor at this time. 7 mm focus of enhancement along the left upper T1 vertebral body is immediately adjacent to the costovertebral junction and is considered nonspecific, possibly degenerative rather than necessarily from malignancy. Mildly congenitally short pedicles in the cervical spine. Cord: No significant abnormal spinal cord signal is observed. Posterior Fossa, vertebral arteries, paraspinal tissues: Unremarkable Disc levels: C2-3: Unremarkable. C3-4: Borderline right foraminal stenosis due to facet and  uncinate spurring. C4-5: Borderline right foraminal stenosis due to uncinate spurring and short pedicles. C5-6: No impingement.  Mild uncinate spurring. C6-7: Borderline left foraminal stenosis due to facet and uncinate spurring. Borderline central narrowing of the thecal sac due to central disc protrusion. C7-T1: Borderline central narrowing of the thecal sac due to disc bulge and small central disc protrusion. IMPRESSION: 1. C4 vertebral body metastatic lesion associated with diffuse enhancement of the vertebral body and early posterior bony cortical destruction but without a significant amount of epidural tumor at this time. 2. Small focus of edema and enhancement in the left upper T1 vertebral body adjacent to the costovertebral joint, quite possibly degenerative rather than necessarily indicative of tumor. This may warrant surveillance. 3. Congenitally short cervical pedicles and cervical spondylosis and degenerative disc disease causing borderline impingement at C3-4, C4-5, C6-7, and C7-T1. Electronically Signed   By: Van Clines M.D.   On: 09/13/2017 08:59   Mr Liver W Wo Contrast  Result Date: 09/04/2017 CLINICAL DATA:  Hypermetabolic lesion within the liver on PET-CT scan. Lesion concerning for metastatic lung cancer EXAM: MRI ABDOMEN WITHOUT AND WITH CONTRAST TECHNIQUE: Multiplanar multisequence MR imaging of the abdomen was performed both before and after the administration of intravenous contrast. CONTRAST:  4mL MULTIHANCE GADOBENATE DIMEGLUMINE 529 MG/ML IV SOLN COMPARISON:  PET-CT scan 08/29/2017 FINDINGS: Lower chest:  Lung bases are clear. Hepatobiliary: Rim enhancing lesion in the central LEFT hepatic lobe (segment 4A) measures 2.2 x 1.9 cm and corresponds to the hypermetabolic lesion on comparison PET-CT scan. Lesion has continuous peripheral enhancement pattern suggestive of hepatic metastasis. Lesion is hyperintense on T2 weighted imaging which can be seen with hemangiomas (image 12,  series 5) however the continuous rim enhancement is most suggestive of metastatic lesion. No additional hepatic lesions are identified. Within the most inferior aspect of the RIGHT hepatic lobe 10 mm hyperintense lesion on T2 weighted imaging (image 30, series 38) demonstrates delayed enhancement (image 49, series 8 most suggestive of a small hemangioma. Pancreas: Normal pancreatic parenchymal intensity. No ductal dilatation or inflammation. Spleen: Normal spleen. Adrenals/urinary tract: Adrenal glands and kidneys are normal. Stomach/Bowel: Stomach and limited of the small bowel is unremarkable Vascular/Lymphatic: Abdominal aortic normal caliber. No retroperitoneal periportal lymphadenopathy. Musculoskeletal: No aggressive osseous lesion IMPRESSION: 1. Enhancing lesion in the central LEFT hepatic lobe corresponds to hypermetabolic lesion on comparison PET-CT scan and is most consistent with a hepatic metastasis. Recommend ultrasound-guided percutaneous biopsy. 2. Probable small hemangioma within the inferior aspect of the RIGHT hepatic lobe. Electronically Signed   By: Suzy Bouchard M.D.   On: 09/04/2017 20:44   Mr Femur Left W Wo Contrast  Result Date: 09/13/2017 CLINICAL DATA:  Stage IV lung cancer. Metastatic cancer to the liver and bone diagnosed February 2019 on PET scan. EXAM: MR OF THE LEFT LOWER EXTREMITY WITHOUT AND WITH CONTRAST TECHNIQUE: Multiplanar, multisequence MR imaging of the left femur was performed both before and  after administration of intravenous contrast. CONTRAST:  20 mL MultiHance COMPARISON:  PET-CT 08/29/2017 FINDINGS: Bones/Joint/Cartilage 14 x 14 x 17 mm heterogeneous bone lesion in the proximal left femoral diaphysis at the level of the lesser trochanter with severe surrounding T2 hyperintensity and enhancement most concerning for metastatic disease with the overall abnormality measuring 2 x 2 x 4 cm and encompassing the entire circumference of the medullary cavity. Some of the  surrounding T2 hyperintensity and enhancement likely reflects reactive changes, but when correlated with recent PET-CT some of the abnormality also is favored to reflect tumor. No cortical destruction or endosteal scalloping. No periostitis. Small ill-defined area of T2 hyperintensity along the medial margin of the ischium with enhancement on postcontrast imaging concerning for metastatic disease. No acute fracture or dislocation. Normal alignment. No joint effusion. Ligaments, Muscles and Tendons Muscles are normal. No muscle edema or muscle atrophy. Small partial tear of the left hamstring origin involving the semi tendinosis and biceps femoris origins. Soft tissue No fluid collection or hematoma.  No soft tissue mass. IMPRESSION: 1. 14 x 14 x 17 mm heterogeneous bone lesion in the proximal left femoral diaphysis at the level of the lesser trochanter with severe surrounding T2 hyperintensity and enhancement most concerning for metastatic disease with the overall abnormality measuring 2 x 2 x 4 cm and encompassing the entire circumference of the medullary cavity. Some of the surrounding T2 hyperintensity and enhancement likely reflects reactive changes, but when correlated with recent PET-CT some of the abnormality also is favored to reflect tumor. No cortical destruction or endosteal scalloping. 2. Small ill-defined area of T2 hyperintensity along the medial margin of the ischium with enhancement on postcontrast imaging concerning for metastatic disease. Electronically Signed   By: Kathreen Devoid   On: 09/13/2017 09:12   Nm Pet Image Initial (pi) Skull Base To Thigh  Result Date: 08/29/2017 CLINICAL DATA:  Initial treatment strategy for solitary pulmonary nodule. EXAM: NUCLEAR MEDICINE PET SKULL BASE TO THIGH TECHNIQUE: 11.7 mCi F-18 FDG was injected intravenously. Full-ring PET imaging was performed from the skull base to thigh after the radiotracer. CT data was obtained and used for attenuation correction and  anatomic localization. FASTING BLOOD GLUCOSE:  Value: 42 mg/dl COMPARISON:  CT 07/18/2017 FINDINGS: NECK No hypermetabolic lymph nodes in the neck. CHEST In the medial aspect of the RIGHT upper lobe 2.4 cm nodule is intensely metabolic with SUV max equal 15. Small hypermetabolic RIGHT hilar lymph node is present. Hypermetabolic RIGHT paratracheal lymph node measures 12 mm short axis with SUV max equal 4.6. ABDOMEN/PELVIS Low-density lesion in the central LEFT hepatic lobe measures 2.2 cm (image 98, series 4) and has metabolic activity above background liver activity (SUV max equal 4.9). Normal adrenal glands. No hypermetabolic abdominopelvic lymph nodes. Kidneys normal. Diverticula of the sigmoid colon. Metabolic activity ascending colon is favored benign physiologic. No hypermetabolic abdominopelvic lymph nodes. SKELETON Lesion in the C4 vertebral body of the cervical spine has intense metabolic activity with SUV max equal 4.7 and correlates with a 1.2 cm lytic lesion within the vertebral body. Additional moderate metabolic activity within the proximal LEFT femur at the level of the inferior trochanter. (SUV max equal 4.8). No CT correlation IMPRESSION: 1. Hypermetabolic RIGHT upper lobe pulmonary nodule most consistent prior bronchogenic carcinoma. 2. Hypermetabolic RIGHT hilar lymph node and RIGHT paratracheal lymph node most consistent with nodal metastasis. 3. Single hypermetabolic lesion within the central liver most concerning for hepatic metastasis. 4. Evidence skeletal metastasis to the C4 vertebral body.  Suspicion of metastatic lesion within the LEFT intertrochanteric femur. Electronically Signed   By: Suzy Bouchard M.D.   On: 08/29/2017 17:09   US Biopsy (liver)  Result Date: 09/06/2017 CLINICAL DATA:  2.4 cm right upper lobe lung mass with associated hypermetabolic right paratracheal metastatic lymphadenopathy, left lobe liver metastasis and hypermetabolic bone metastases by PET scan. Additional  MRI of the abdomen demonstrates the liver lesion in segment 4A of the left lobe measuring approximately 2.2 cm and having characteristics consistent with a probable metastatic lesion. The patient presents for biopsy of the liver lesion. EXAM: ULTRASOUND GUIDED CORE BIOPSY OF LIVER MEDICATIONS: 3.5 mg IV Versed; 150 mcg IV Fentanyl Total Moderate Sedation Time: 28 minutes. The patient's level of consciousness and physiologic status were continuously monitored during the procedure by Radiology nursing. PROCEDURE: The procedure, risks, benefits, and alternatives were explained to the patient. Questions regarding the procedure were encouraged and answered. The patient understands and consents to the procedure. A time out was performed prior to initiating the procedure. The abdominal wall was prepped with chlorhexidine in a sterile fashion, and a sterile drape was applied covering the operative field. A sterile gown and sterile gloves were used for the procedure. Local anesthesia was provided with 1% Lidocaine. Ultrasound was used to localize a lesion within the left lobe of the liver. Under direct ultrasound guidance, a 17 gauge needle was advanced to the anterior margin of the lesion. Two separate coaxial 18 gauge core biopsy samples were obtained through the lesion. Core biopsy samples were submitted in formalin. Gel-Foam pledgets were advanced through the outer needle as the needle was retracted. COMPLICATIONS: None. FINDINGS: Oval-shaped hypoechoic mass is identified in the left lobe of the liver. The surrounding liver parenchyma is echogenic, consistent with steatosis. The lesion measures approximately 2.0 x 2.6 cm in greatest dimensions by ultrasound. Solid tissue was obtained. IMPRESSION: Ultrasound-guided core biopsy performed of a lesion in the left lobe of the liver measuring 2.6 cm in greatest diameter by ultrasound. Electronically Signed   By: Aletta Edouard M.D.   On: 09/06/2017 10:21    Impression:   Stage IV adenocarcinoma of lung. The patient's MRI of the cervical spine confirmed a significant lesion at C4 and I would recommend palliative radiation therapy to this region to prevent further progression with significant neurologic compromise. The patient does seem to have some discomfort in the neck at this time. Would also recommend palliative radiation therapy to the upper left femur given the significant involvement noted on MRI. The cortex appears to be intact so does not appear that the patient would require orthopedic stabilization.  Plan:  Patient will proceed with CT simulation later this afternoon. He will begin his radiation treatments on February 21. He will receive 10 treatments to both areas.  -----------------------------------  Blair Promise, PhD, MD  This document serves as a record of services personally performed by Gery Pray, MD. It was created on his behalf by Arlyce Harman, a trained medical scribe. The creation of this record is based on the scribe's personal observations and the provider's statements to them. This document has been checked and approved by the attending provider.

## 2017-09-20 NOTE — Progress Notes (Addendum)
Histology and Location of Primary Cancer: Stage IVB(T1b, N2, M1c)non-small cell lung cancer  Location(s) of Symptomatic Metastases: left hip and C4 vertebrae  Past/Anticipated chemotherapy by medical oncology, if any: Systemic chemotherapy with carboplatin for an AUC of 5, Alimta 500 mg meter squared, and Keytruda 200 mg IV given every 3 weeks pending the results of foundation one testing.  First dose anticipated on 10/04/2017.   Pain on a scale of 0-10 is: he reports having a sore neck which started about 3 weeks ago and in his left hip when sitting.   If Spine Met(s), symptoms, if any, include:  Bowel/Bladder retention or incontinence (please describe): no  Numbness or weakness in extremities (please describe): feet and hands sometimes go numb when laying down.  Also noticed that his back "tightens up" with walking.    Current Decadron regimen, if applicable: none  Ambulatory status? Walker? Wheelchair?: Ambulatory  SAFETY ISSUES:  Prior radiation? no  Pacemaker/ICD? no  Possible current pregnancy? no  Is the patient on methotrexate? no  Current Complaints / other details:  Patient's temp is elevated at 99.9. He said he feels like he is getting a cold.  BP 121/78 (BP Location: Right Arm, Patient Position: Sitting)   Pulse 85   Temp 99.9 F (37.7 C) (Oral)   Ht '5\' 10"'$  (1.778 m)   Wt 207 lb 9.6 oz (94.2 kg)   SpO2 100%   BMI 29.79 kg/m    Wt Readings from Last 3 Encounters:  09/20/17 207 lb 9.6 oz (94.2 kg)  09/07/17 207 lb (93.9 kg)  09/06/17 200 lb (90.7 kg)

## 2017-09-21 ENCOUNTER — Ambulatory Visit
Admission: RE | Admit: 2017-09-21 | Discharge: 2017-09-21 | Disposition: A | Discharge status: Home / Self Care | Payer: BLUE CROSS/BLUE SHIELD | Source: Ambulatory Visit | Attending: Radiation Oncology | Admitting: Radiation Oncology

## 2017-09-21 ENCOUNTER — Ambulatory Visit: Payer: BLUE CROSS/BLUE SHIELD | Admitting: Radiation Oncology

## 2017-09-21 DIAGNOSIS — C3411 Malignant neoplasm of upper lobe, right bronchus or lung: Secondary | ICD-10-CM | POA: Diagnosis not present

## 2017-09-21 DIAGNOSIS — C3491 Malignant neoplasm of unspecified part of right bronchus or lung: Secondary | ICD-10-CM

## 2017-09-21 NOTE — Progress Notes (Signed)
  Radiation Oncology         [phone]) [phone] ________________________________  Name: Anthony [name] MRN: [id number]  Date: 09/21/2017  DOB: 1971-07-04  Simulation Verification Note    ICD-10-CM   1. Stage 4 lung cancer, right ([facility]) C34.91     Status: outpatient  NARRATIVE: The patient was brought to the treatment unit and placed in the planned treatment position. The clinical setup was verified. Then port films were obtained and uploaded to the radiation oncology medical record software.  The treatment beams were carefully compared against the planned radiation fields. The position location and shape of the radiation fields was reviewed. They targeted volume of tissue appears to be appropriately covered by the radiation beams. Organs at risk appear to be excluded as planned.  Based on my personal review, I approved the simulation verification. The patient's treatment will proceed as planned.  -----------------------------------  Blair Promise, PhD, MD

## 2017-09-22 ENCOUNTER — Ambulatory Visit
Admission: RE | Admit: 2017-09-22 | Discharge: 2017-09-22 | Disposition: A | Discharge status: Home / Self Care | Payer: BLUE CROSS/BLUE SHIELD | Source: Ambulatory Visit | Attending: Radiation Oncology | Admitting: Radiation Oncology

## 2017-09-22 DIAGNOSIS — C3411 Malignant neoplasm of upper lobe, right bronchus or lung: Secondary | ICD-10-CM | POA: Diagnosis not present

## 2017-09-25 ENCOUNTER — Ambulatory Visit: Payer: BLUE CROSS/BLUE SHIELD | Admitting: Interventional Cardiology

## 2017-09-25 ENCOUNTER — Ambulatory Visit
Admission: RE | Admit: 2017-09-25 | Discharge: 2017-09-25 | Disposition: A | Discharge status: Home / Self Care | Payer: BLUE CROSS/BLUE SHIELD | Source: Ambulatory Visit | Attending: Radiation Oncology | Admitting: Radiation Oncology

## 2017-09-25 DIAGNOSIS — C3411 Malignant neoplasm of upper lobe, right bronchus or lung: Secondary | ICD-10-CM | POA: Diagnosis not present

## 2017-09-26 ENCOUNTER — Ambulatory Visit
Admission: RE | Admit: 2017-09-26 | Discharge: 2017-09-26 | Disposition: A | Discharge status: Home / Self Care | Payer: BLUE CROSS/BLUE SHIELD | Source: Ambulatory Visit | Attending: Radiation Oncology | Admitting: Radiation Oncology

## 2017-09-26 ENCOUNTER — Encounter: Payer: Self-pay | Admitting: *Deleted

## 2017-09-26 ENCOUNTER — Inpatient Hospital Stay: Payer: BLUE CROSS/BLUE SHIELD | Attending: Internal Medicine

## 2017-09-26 DIAGNOSIS — C3411 Malignant neoplasm of upper lobe, right bronchus or lung: Secondary | ICD-10-CM | POA: Diagnosis not present

## 2017-09-26 DIAGNOSIS — I252 Old myocardial infarction: Secondary | ICD-10-CM | POA: Insufficient documentation

## 2017-09-26 DIAGNOSIS — Z79899 Other long term (current) drug therapy: Secondary | ICD-10-CM | POA: Insufficient documentation

## 2017-09-26 DIAGNOSIS — C7951 Secondary malignant neoplasm of bone: Secondary | ICD-10-CM | POA: Insufficient documentation

## 2017-09-26 DIAGNOSIS — C7801 Secondary malignant neoplasm of right lung: Secondary | ICD-10-CM | POA: Insufficient documentation

## 2017-09-26 DIAGNOSIS — K219 Gastro-esophageal reflux disease without esophagitis: Secondary | ICD-10-CM | POA: Insufficient documentation

## 2017-09-26 DIAGNOSIS — C787 Secondary malignant neoplasm of liver and intrahepatic bile duct: Secondary | ICD-10-CM | POA: Insufficient documentation

## 2017-09-27 ENCOUNTER — Ambulatory Visit
Admission: RE | Admit: 2017-09-27 | Discharge: 2017-09-27 | Disposition: A | Discharge status: Home / Self Care | Payer: BLUE CROSS/BLUE SHIELD | Source: Ambulatory Visit | Attending: Radiation Oncology | Admitting: Radiation Oncology

## 2017-09-27 DIAGNOSIS — C3411 Malignant neoplasm of upper lobe, right bronchus or lung: Secondary | ICD-10-CM | POA: Diagnosis not present

## 2017-09-28 ENCOUNTER — Encounter (HOSPITAL_COMMUNITY): Payer: Self-pay

## 2017-09-28 ENCOUNTER — Ambulatory Visit
Admission: RE | Admit: 2017-09-28 | Discharge: 2017-09-28 | Disposition: A | Discharge status: Home / Self Care | Payer: BLUE CROSS/BLUE SHIELD | Source: Ambulatory Visit | Attending: Radiation Oncology | Admitting: Radiation Oncology

## 2017-09-28 DIAGNOSIS — C3411 Malignant neoplasm of upper lobe, right bronchus or lung: Secondary | ICD-10-CM | POA: Diagnosis not present

## 2017-09-29 ENCOUNTER — Telehealth: Payer: Self-pay | Admitting: *Deleted

## 2017-09-29 ENCOUNTER — Ambulatory Visit
Admission: RE | Admit: 2017-09-29 | Discharge: 2017-09-29 | Disposition: A | Discharge status: Home / Self Care | Payer: BLUE CROSS/BLUE SHIELD | Source: Ambulatory Visit | Attending: Radiation Oncology | Admitting: Radiation Oncology

## 2017-09-29 DIAGNOSIS — Z510 Encounter for antineoplastic radiation therapy: Secondary | ICD-10-CM | POA: Insufficient documentation

## 2017-09-29 DIAGNOSIS — C3490 Malignant neoplasm of unspecified part of unspecified bronchus or lung: Secondary | ICD-10-CM | POA: Diagnosis not present

## 2017-09-29 DIAGNOSIS — C7951 Secondary malignant neoplasm of bone: Secondary | ICD-10-CM | POA: Diagnosis not present

## 2017-09-29 NOTE — Telephone Encounter (Signed)
Oncology Nurse Navigator Documentation  Oncology Nurse Navigator Flowsheets 09/29/2017  Navigator Location [facility]  Navigator Encounter Type Telephone/Mr. Sarratt called and left me a vm message to call with biopsy results. I updated Dr. [name] and he called patient with the update. I made a follow up call to see if he had any questions or concerns. He states he is doing and feeling better.  He is aware of up coming treatment schedule.   Telephone Outgoing Call  Patient Visit Type MedOnc  Treatment Phase Treatment  Barriers/Navigation Needs Education  Education Other  Interventions Education  Education Method Verbal  Acuity Level 2  Time Spent with Patient 30

## 2017-10-02 ENCOUNTER — Encounter: Payer: Self-pay | Admitting: Internal Medicine

## 2017-10-02 ENCOUNTER — Ambulatory Visit
Admission: RE | Admit: 2017-10-02 | Discharge: 2017-10-02 | Disposition: A | Discharge status: Home / Self Care | Payer: BLUE CROSS/BLUE SHIELD | Source: Ambulatory Visit | Attending: Radiation Oncology | Admitting: Radiation Oncology

## 2017-10-02 DIAGNOSIS — Z510 Encounter for antineoplastic radiation therapy: Secondary | ICD-10-CM | POA: Diagnosis not present

## 2017-10-02 NOTE — Progress Notes (Signed)
Spoke w/ pt to introduce myself as his Arboriculturist and to discuss copay assistance.  Pt would like to apply so I completed the application for Alimta and Keytruda, got the dr signature and will get pts signature on 10/03/17, once received I will fax to [employer] and to [employer] for processing.  I will notify the pt of the decision once I receive it.  Pt is overqualified for the [employer].  I will give him my card for any questions or concerns he may have in the future.

## 2017-10-03 ENCOUNTER — Ambulatory Visit
Admission: RE | Admit: 2017-10-03 | Discharge: 2017-10-03 | Disposition: A | Discharge status: Home / Self Care | Payer: BLUE CROSS/BLUE SHIELD | Source: Ambulatory Visit | Attending: Radiation Oncology | Admitting: Radiation Oncology

## 2017-10-03 DIAGNOSIS — C7951 Secondary malignant neoplasm of bone: Secondary | ICD-10-CM | POA: Insufficient documentation

## 2017-10-03 DIAGNOSIS — Z5112 Encounter for antineoplastic immunotherapy: Secondary | ICD-10-CM | POA: Diagnosis not present

## 2017-10-03 DIAGNOSIS — Z7982 Long term (current) use of aspirin: Secondary | ICD-10-CM | POA: Diagnosis not present

## 2017-10-03 DIAGNOSIS — Z5111 Encounter for antineoplastic chemotherapy: Secondary | ICD-10-CM | POA: Insufficient documentation

## 2017-10-03 DIAGNOSIS — Z79899 Other long term (current) drug therapy: Secondary | ICD-10-CM | POA: Diagnosis not present

## 2017-10-03 DIAGNOSIS — I252 Old myocardial infarction: Secondary | ICD-10-CM | POA: Insufficient documentation

## 2017-10-03 DIAGNOSIS — C787 Secondary malignant neoplasm of liver and intrahepatic bile duct: Secondary | ICD-10-CM | POA: Insufficient documentation

## 2017-10-03 DIAGNOSIS — K219 Gastro-esophageal reflux disease without esophagitis: Secondary | ICD-10-CM | POA: Diagnosis not present

## 2017-10-03 DIAGNOSIS — Z510 Encounter for antineoplastic radiation therapy: Secondary | ICD-10-CM | POA: Diagnosis not present

## 2017-10-03 DIAGNOSIS — Z923 Personal history of irradiation: Secondary | ICD-10-CM | POA: Insufficient documentation

## 2017-10-03 DIAGNOSIS — C3411 Malignant neoplasm of upper lobe, right bronchus or lung: Secondary | ICD-10-CM | POA: Diagnosis present

## 2017-10-03 MED ORDER — SONAFINE EX EMUL
1.0000 "application " | Freq: Once | CUTANEOUS | Status: AC
  Administered 2017-10-03: 1 via TOPICAL

## 2017-10-04 ENCOUNTER — Inpatient Hospital Stay (HOSPITAL_BASED_OUTPATIENT_CLINIC_OR_DEPARTMENT_OTHER): Payer: BLUE CROSS/BLUE SHIELD | Admitting: Internal Medicine

## 2017-10-04 ENCOUNTER — Ambulatory Visit
Admission: RE | Admit: 2017-10-04 | Discharge: 2017-10-04 | Disposition: A | Discharge status: Home / Self Care | Payer: BLUE CROSS/BLUE SHIELD | Source: Ambulatory Visit | Attending: Radiation Oncology | Admitting: Radiation Oncology

## 2017-10-04 ENCOUNTER — Inpatient Hospital Stay: Payer: BLUE CROSS/BLUE SHIELD | Attending: Internal Medicine

## 2017-10-04 ENCOUNTER — Encounter: Payer: Self-pay | Admitting: *Deleted

## 2017-10-04 ENCOUNTER — Encounter: Payer: Self-pay | Admitting: Medical Oncology

## 2017-10-04 ENCOUNTER — Telehealth: Payer: Self-pay | Admitting: Internal Medicine

## 2017-10-04 ENCOUNTER — Encounter: Payer: Self-pay | Admitting: Internal Medicine

## 2017-10-04 ENCOUNTER — Inpatient Hospital Stay: Payer: BLUE CROSS/BLUE SHIELD

## 2017-10-04 VITALS — BP 126/77 | HR 65 | Temp 98.5°F | Resp 20 | Ht 70.0 in | Wt 211.6 lb

## 2017-10-04 DIAGNOSIS — Z7982 Long term (current) use of aspirin: Secondary | ICD-10-CM | POA: Diagnosis not present

## 2017-10-04 DIAGNOSIS — C3411 Malignant neoplasm of upper lobe, right bronchus or lung: Secondary | ICD-10-CM

## 2017-10-04 DIAGNOSIS — C3491 Malignant neoplasm of unspecified part of right bronchus or lung: Secondary | ICD-10-CM

## 2017-10-04 DIAGNOSIS — C787 Secondary malignant neoplasm of liver and intrahepatic bile duct: Secondary | ICD-10-CM | POA: Diagnosis not present

## 2017-10-04 DIAGNOSIS — C7951 Secondary malignant neoplasm of bone: Secondary | ICD-10-CM | POA: Diagnosis not present

## 2017-10-04 DIAGNOSIS — Z5112 Encounter for antineoplastic immunotherapy: Secondary | ICD-10-CM

## 2017-10-04 DIAGNOSIS — Z79899 Other long term (current) drug therapy: Secondary | ICD-10-CM

## 2017-10-04 DIAGNOSIS — K219 Gastro-esophageal reflux disease without esophagitis: Secondary | ICD-10-CM | POA: Diagnosis not present

## 2017-10-04 DIAGNOSIS — Z5111 Encounter for antineoplastic chemotherapy: Secondary | ICD-10-CM

## 2017-10-04 DIAGNOSIS — N183 Chronic kidney disease, stage 3 unspecified: Secondary | ICD-10-CM

## 2017-10-04 DIAGNOSIS — I252 Old myocardial infarction: Secondary | ICD-10-CM | POA: Diagnosis not present

## 2017-10-04 DIAGNOSIS — Z510 Encounter for antineoplastic radiation therapy: Secondary | ICD-10-CM | POA: Diagnosis not present

## 2017-10-04 DIAGNOSIS — Z923 Personal history of irradiation: Secondary | ICD-10-CM | POA: Diagnosis not present

## 2017-10-04 LAB — CBC WITH DIFFERENTIAL (CANCER CENTER ONLY)
BASOS ABS: 0.1 10*3/uL (ref 0.0–0.1)
BASOS PCT: 1 %
Eosinophils Absolute: 0 10*3/uL (ref 0.0–0.5)
Eosinophils Relative: 0 %
HEMATOCRIT: 44.3 % (ref 38.4–49.9)
Hemoglobin: 15 g/dL (ref 13.0–17.1)
Lymphocytes Relative: 4 %
Lymphs Abs: 0.5 10*3/uL — ABNORMAL LOW (ref 0.9–3.3)
MCH: 30 pg (ref 27.2–33.4)
MCHC: 33.8 g/dL (ref 32.0–36.0)
MCV: 88.6 fL (ref 79.3–98.0)
MONO ABS: 0.2 10*3/uL (ref 0.1–0.9)
Monocytes Relative: 1 %
NEUTROS ABS: 12.2 10*3/uL — AB (ref 1.5–6.5)
Neutrophils Relative %: 94 %
PLATELETS: 324 10*3/uL (ref 140–400)
RBC: 5 MIL/uL (ref 4.20–5.82)
RDW: 14.4 % (ref 11.0–14.6)
WBC: 13 10*3/uL — AB (ref 4.0–10.3)

## 2017-10-04 LAB — CMP (CANCER CENTER ONLY)
ALBUMIN: 4.1 g/dL (ref 3.5–5.0)
ALT: 26 U/L (ref 0–55)
ANION GAP: 11 (ref 3–11)
AST: 14 U/L (ref 5–34)
Alkaline Phosphatase: 64 U/L (ref 40–150)
BILIRUBIN TOTAL: 0.7 mg/dL (ref 0.2–1.2)
BUN: 11 mg/dL (ref 7–26)
CALCIUM: 10.1 mg/dL (ref 8.4–10.4)
CO2: 26 mmol/L (ref 22–29)
Chloride: 104 mmol/L (ref 98–109)
Creatinine: 1 mg/dL (ref 0.70–1.30)
GLUCOSE: 139 mg/dL (ref 70–140)
Potassium: 4.2 mmol/L (ref 3.5–5.1)
Sodium: 141 mmol/L (ref 136–145)
TOTAL PROTEIN: 7.7 g/dL (ref 6.4–8.3)

## 2017-10-04 LAB — TSH: TSH: 0.26 u[IU]/mL — ABNORMAL LOW (ref 0.320–4.118)

## 2017-10-04 MED ORDER — SODIUM CHLORIDE 0.9 % IV SOLN
Freq: Once | INTRAVENOUS | Status: AC
  Administered 2017-10-04: 12:00:00 via INTRAVENOUS
  Filled 2017-10-04: qty 5

## 2017-10-04 MED ORDER — CHLORPROMAZINE HCL 25 MG PO TABS: 25.0000 mg | ORAL_TABLET | Freq: Three times a day (TID) | ORAL | 0 refills | Status: DC | PRN

## 2017-10-04 MED ORDER — PEMETREXED DISODIUM CHEMO INJECTION 500 MG
510.0000 mg/m2 | Freq: Once | INTRAVENOUS | Status: AC
  Administered 2017-10-04: 1100 mg via INTRAVENOUS
  Filled 2017-10-04: qty 40

## 2017-10-04 MED ORDER — SODIUM CHLORIDE 0.9 % IV SOLN
Freq: Once | INTRAVENOUS | Status: AC
  Administered 2017-10-04: 11:00:00 via INTRAVENOUS

## 2017-10-04 MED ORDER — PALONOSETRON HCL INJECTION 0.25 MG/5ML
0.2500 mg | Freq: Once | INTRAVENOUS | Status: AC
  Administered 2017-10-04: 0.25 mg via INTRAVENOUS

## 2017-10-04 MED ORDER — SODIUM CHLORIDE 0.9 % IV SOLN
714.5000 mg | Freq: Once | INTRAVENOUS | Status: AC
  Administered 2017-10-04: 710 mg via INTRAVENOUS
  Filled 2017-10-04: qty 71

## 2017-10-04 MED ORDER — SODIUM CHLORIDE 0.9 % IV SOLN
200.0000 mg | Freq: Once | INTRAVENOUS | Status: AC
  Administered 2017-10-04: 200 mg via INTRAVENOUS
  Filled 2017-10-04: qty 8

## 2017-10-04 MED ORDER — PALONOSETRON HCL INJECTION 0.25 MG/5ML
INTRAVENOUS | Status: AC
  Filled 2017-10-04: qty 5

## 2017-10-04 NOTE — Progress Notes (Signed)
[facility] [phone]   [phone]  OFFICE PROGRESS NOTE  [name], MD [address] [place] [address]  DIAGNOSIS: Stage IVB(T1b, N2, M1c)non-small cell lung cancer, adenocarcinoma presented with right upper lobe lung nodule in addition to right hilar and mediastinal lymphadenopathy as well as metastatic liver and bone disease diagnosed in February 2019.  Biomarker Findings Microsatellite status - MS-Stable Tumor Mutational Burden - TMB-Low (4 Muts/Mb) Genomic Findings For a complete list of the genes assayed, please refer to the Appendix. EGFR exon 20 insertion ([id number]) RB1 loss exons 9-17 TP53 [id number] 7 Disease relevant genes with no reportable alterations: KRAS, ALK, BRAF, MET, RET, ERBB2, ROS1  PDL 1 expression 5%   PRIOR THERAPY: Palliative radiotherapy to the metastatic bone lesions in the cervical spine and left femur under the care of Dr. Sondra Come.  CURRENT THERAPY: Systemic chemotherapy with carboplatin for AC of 5, Alimta 500 mg/M2 and Keytruda 200 mg IV every 3 weeks.  First dose October 04, 2017.  INTERVAL HISTORY: Anthony [name] 47 y.o. male returns to the clinic today for follow-up visit accompanied by his friend.  The patient is feeling fine today with no specific complaints.  He is currently undergoing palliative radiotherapy to the cervical spine and left femur under the care of Dr. Sondra Come.  Loss of fraction today.  He denied having any chest pain, shortness of breath, cough or hemoptysis.  He has some hiccups started recently.  He denied having any recent weight loss or night sweats.  He has no nausea, vomiting, diarrhea or constipation.  He had molecular studies by foundation 1 performed recently that showed no actionable mutation and EGFR mutation was in exon 20 indicating resistant to target therapy.  PDL 1 expression was 5%.  The patient is here today for evaluation before starting the first  cycle of his systemic chemotherapy with carboplatin, Alimta and Keytruda.  MEDICAL HISTORY: Past Medical History:  Diagnosis Date  . Acid reflux   . Non-small cell lung cancer ([place])   . NSTEMI (non-ST elevated myocardial infarction) ([place]) 06/13/2017   [name] 06/14/2017  . Tailbone injury since 1993   "cracked"    ALLERGIES:  has No Known Allergies.  MEDICATIONS:  Current Outpatient Medications  Medication Sig Dispense Refill  . aspirin EC 81 MG EC tablet Take 1 tablet (81 mg total) by mouth daily.    Marland Kitchen atorvastatin (LIPITOR) 80 MG tablet Take 1 tablet (80 mg total) by mouth daily at 6 PM. 30 tablet 3  . clopidogrel (PLAVIX) 75 MG tablet Take 1 tablet (75 mg total) by mouth daily. 30 tablet 3  . dexamethasone (DECADRON) 4 MG tablet Take 1 tablet twice a day the day before, day of, and day after each cycle of chemotherapy. 20 tablet 1  . esomeprazole (NEXIUM) 20 MG capsule Take 20 mg by mouth every other day.     . folic acid (FOLVITE) 1 MG tablet Take 1 tablet (1 mg total) by mouth daily. 30 tablet 2  . lisinopril (PRINIVIL,ZESTRIL) 5 MG tablet Take 1 tablet (5 mg total) by mouth daily. 30 tablet 3  . loratadine (CLARITIN) 10 MG tablet Take 10 mg daily as needed by mouth for allergies.    . metoprolol tartrate (LOPRESSOR) 25 MG tablet Take 0.5 tablets (12.5 mg total) by mouth 2 (two) times daily. 30 tablet 3  . prochlorperazine (COMPAZINE) 10 MG tablet Take 1 tablet (10 mg total) by mouth every 6 (  six) hours as needed for nausea or vomiting. (Patient not taking: Reported on 09/20/2017) 30 tablet 1  . Wound Dressings (SONAFINE EX) Apply topically.     No current facility-administered medications for this visit.     SURGICAL HISTORY:  Past Surgical History:  Procedure Laterality Date  . CARDIAC CATHETERIZATION  06/14/2017  . CORONARY ARTERY BYPASS GRAFT N/A 06/19/2017   Procedure: CORONARY ARTERY BYPASS GRAFTING (CABG), OFF PUMP, times one using the left internal mammary artery to  LAD;  Surgeon: Grace Isaac, MD;  Location: [facility];  Service: Open Heart Surgery;  Laterality: N/A;  . LEFT HEART CATH AND CORONARY ANGIOGRAPHY N/A 06/14/2017   Procedure: LEFT HEART CATH AND CORONARY ANGIOGRAPHY;  Surgeon: Sherren Mocha, MD;  Location: [facility];  Service: Cardiovascular;  Laterality: N/A;  . TEE WITHOUT CARDIOVERSION N/A 06/19/2017   Procedure: TRANSESOPHAGEAL ECHOCARDIOGRAM (TEE);  Surgeon: Grace Isaac, MD;  Location: [facility];  Service: Open Heart Surgery;  Laterality: N/A;  . TONSILLECTOMY  ~ 1977    REVIEW OF SYSTEMS:  Constitutional: negative Eyes: negative Ears, nose, mouth, throat, and face: negative Respiratory: negative Cardiovascular: negative Gastrointestinal: positive for Hiccup Genitourinary:negative Integument/breast: negative Hematologic/lymphatic: negative Musculoskeletal:negative Neurological: negative Behavioral/Psych: negative Endocrine: negative Allergic/Immunologic: negative   PHYSICAL EXAMINATION: General appearance: alert, cooperative and no distress Head: Normocephalic, without obvious abnormality, atraumatic Neck: no adenopathy, no JVD, supple, symmetrical, trachea midline and thyroid not enlarged, symmetric, no tenderness/mass/nodules Lymph nodes: Cervical, supraclavicular, and axillary nodes normal. Resp: clear to auscultation bilaterally Back: symmetric, no curvature. ROM normal. No CVA tenderness. Cardio: regular rate and rhythm, S1, S2 normal, no murmur, click, rub or gallop GI: soft, non-tender; bowel sounds normal; no masses,  no organomegaly Extremities: extremities normal, atraumatic, no cyanosis or edema Neurologic: Alert and oriented X 3, normal strength and tone. Normal symmetric reflexes. Normal coordination and gait  ECOG PERFORMANCE STATUS: 1 - Symptomatic but completely ambulatory  Blood pressure 126/77, pulse 65, temperature 98.5 F (36.9 C), temperature source Oral, resp. rate 20, height _0   (1.778 m), weight 211 lb 9.6 oz (96 kg), SpO2 100 %.  LABORATORY DATA: Lab Results  Component Value Date   WBC 13.0 (H) 10/04/2017   HGB 15.2 09/06/2017   HCT 44.3 10/04/2017   MCV 88.6 10/04/2017   PLT 324 10/04/2017      Chemistry      Component Value Date/Time   NA 143 09/19/2017 0828   NA 141 07/17/2017 1006   K 4.4 09/19/2017 0828   CL 107 09/19/2017 0828   CO2 28 09/19/2017 0828   BUN 16 09/19/2017 0828   BUN 12 07/17/2017 1006   CREATININE 1.04 09/19/2017 0828      Component Value Date/Time   CALCIUM 9.9 09/19/2017 0828   ALKPHOS 71 09/19/2017 0828   AST 29 09/19/2017 0828   ALT 37 09/19/2017 0828   BILITOT 0.7 09/19/2017 0828       RADIOGRAPHIC STUDIES: Mr Jeri Cos FR Contrast  Result Date: 09/15/2017 CLINICAL DATA:  Lung cancer staging.  Non-small cell lung carcinoma. EXAM: MRI HEAD WITHOUT AND WITH CONTRAST TECHNIQUE: Multiplanar, multiecho pulse sequences of the brain and surrounding structures were obtained without and with intravenous contrast. CONTRAST:  11m MULTIHANCE GADOBENATE DIMEGLUMINE 529 MG/ML IV SOLN COMPARISON:  None. FINDINGS: Brain: The midline structures are normal. There is no acute infarct or acute hemorrhage. No mass lesion, hydrocephalus, dural abnormality or extra-axial collection. Minimal white matter hyperintensity, nonspecific and commonly seen in asymptomatic patients  of this age. No age-advanced or lobar predominant atrophy. No chronic microhemorrhage or superficial siderosis. No contrast-enhancing lesions. Vascular: Major intracranial arterial and venous sinus flow voids are preserved. Skull and upper cervical spine: The visualized skull base, calvarium, upper cervical spine and extracranial soft tissues are normal. Sinuses/Orbits: No fluid levels or advanced mucosal thickening. No mastoid or middle ear effusion. Normal orbits. IMPRESSION: No intracranial metastatic disease. Electronically Signed   By: Ulyses Jarred M.D.   On: 09/15/2017  18:37   Mr Cervical Spine W Wo Contrast  Result Date: 09/13/2017 CLINICAL DATA:  Metastatic lung cancer with lesion in the C4 vertebra noted on PET-CT. EXAM: MRI CERVICAL SPINE WITHOUT AND WITH CONTRAST TECHNIQUE: Multiplanar and multiecho pulse sequences of the cervical spine, to include the craniocervical junction and cervicothoracic junction, were obtained without and with intravenous contrast. CONTRAST:  3m MULTIHANCE GADOBENATE DIMEGLUMINE 529 MG/ML IV SOLN COMPARISON:  08/29/2017 FINDINGS: Despite efforts by the technologist and patient, motion artifact is present on today's exam and could not be eliminated. This reduces exam sensitivity and specificity. Alignment: No vertebral subluxation is observed. Vertebrae: There is diffuse abnormal enhancement throughout the C4 vertebral body associated with a left posterior 1.2 by 1.9 cm lesion within the vertebral body shown on image 17/10. This measures slightly larger than the lytic lesion seen on PET-CT, but I am including the low signal intensity margin which may represent some marginal sclerosis. There is a suggestion of slight convexity of the contour of the vertebral body for example on image 9/8 with demineralization of the overlying cortex. I do not see a significant amount of epidural tumor at this time. 7 mm focus of enhancement along the left upper T1 vertebral body is immediately adjacent to the costovertebral junction and is considered nonspecific, possibly degenerative rather than necessarily from malignancy. Mildly congenitally short pedicles in the cervical spine. Cord: No significant abnormal spinal cord signal is observed. Posterior Fossa, vertebral arteries, paraspinal tissues: Unremarkable Disc levels: C2-3: Unremarkable. C3-4: Borderline right foraminal stenosis due to facet and uncinate spurring. C4-5: Borderline right foraminal stenosis due to uncinate spurring and short pedicles. C5-6: No impingement.  Mild uncinate spurring. C6-7:  Borderline left foraminal stenosis due to facet and uncinate spurring. Borderline central narrowing of the thecal sac due to central disc protrusion. C7-T1: Borderline central narrowing of the thecal sac due to disc bulge and small central disc protrusion. IMPRESSION: 1. C4 vertebral body metastatic lesion associated with diffuse enhancement of the vertebral body and early posterior bony cortical destruction but without a significant amount of epidural tumor at this time. 2. Small focus of edema and enhancement in the left upper T1 vertebral body adjacent to the costovertebral joint, quite possibly degenerative rather than necessarily indicative of tumor. This may warrant surveillance. 3. Congenitally short cervical pedicles and cervical spondylosis and degenerative disc disease causing borderline impingement at C3-4, C4-5, C6-7, and C7-T1. Electronically Signed   By: WVan ClinesM.D.   On: 09/13/2017 08:59   Mr Liver W Wo Contrast  Result Date: 09/04/2017 CLINICAL DATA:  Hypermetabolic lesion within the liver on PET-CT scan. Lesion concerning for metastatic lung cancer EXAM: MRI ABDOMEN WITHOUT AND WITH CONTRAST TECHNIQUE: Multiplanar multisequence MR imaging of the abdomen was performed both before and after the administration of intravenous contrast. CONTRAST:  164mMULTIHANCE GADOBENATE DIMEGLUMINE 529 MG/ML IV SOLN COMPARISON:  PET-CT scan 08/29/2017 FINDINGS: Lower chest:  Lung bases are clear. Hepatobiliary: Rim enhancing lesion in the central LEFT hepatic lobe (segment 4A) measures  2.2 x 1.9 cm and corresponds to the hypermetabolic lesion on comparison PET-CT scan. Lesion has continuous peripheral enhancement pattern suggestive of hepatic metastasis. Lesion is hyperintense on T2 weighted imaging which can be seen with hemangiomas (image 12, series 5) however the continuous rim enhancement is most suggestive of metastatic lesion. No additional hepatic lesions are identified. Within the most inferior  aspect of the RIGHT hepatic lobe 10 mm hyperintense lesion on T2 weighted imaging (image 30, series 38) demonstrates delayed enhancement (image 49, series 8 most suggestive of a small hemangioma. Pancreas: Normal pancreatic parenchymal intensity. No ductal dilatation or inflammation. Spleen: Normal spleen. Adrenals/urinary tract: Adrenal glands and kidneys are normal. Stomach/Bowel: Stomach and limited of the small bowel is unremarkable Vascular/Lymphatic: Abdominal aortic normal caliber. No retroperitoneal periportal lymphadenopathy. Musculoskeletal: No aggressive osseous lesion IMPRESSION: 1. Enhancing lesion in the central LEFT hepatic lobe corresponds to hypermetabolic lesion on comparison PET-CT scan and is most consistent with a hepatic metastasis. Recommend ultrasound-guided percutaneous biopsy. 2. Probable small hemangioma within the inferior aspect of the RIGHT hepatic lobe. Electronically Signed   By: Suzy Bouchard M.D.   On: 09/04/2017 20:44   Mr Femur Left W Wo Contrast  Result Date: 09/13/2017 CLINICAL DATA:  Stage IV lung cancer. Metastatic cancer to the liver and bone diagnosed February 2019 on PET scan. EXAM: MR OF THE LEFT LOWER EXTREMITY WITHOUT AND WITH CONTRAST TECHNIQUE: Multiplanar, multisequence MR imaging of the left femur was performed both before and after administration of intravenous contrast. CONTRAST:  20 mL MultiHance COMPARISON:  PET-CT 08/29/2017 FINDINGS: Bones/Joint/Cartilage 14 x 14 x 17 mm heterogeneous bone lesion in the proximal left femoral diaphysis at the level of the lesser trochanter with severe surrounding T2 hyperintensity and enhancement most concerning for metastatic disease with the overall abnormality measuring 2 x 2 x 4 cm and encompassing the entire circumference of the medullary cavity. Some of the surrounding T2 hyperintensity and enhancement likely reflects reactive changes, but when correlated with recent PET-CT some of the abnormality also is favored to  reflect tumor. No cortical destruction or endosteal scalloping. No periostitis. Small ill-defined area of T2 hyperintensity along the medial margin of the ischium with enhancement on postcontrast imaging concerning for metastatic disease. No acute fracture or dislocation. Normal alignment. No joint effusion. Ligaments, Muscles and Tendons Muscles are normal. No muscle edema or muscle atrophy. Small partial tear of the left hamstring origin involving the semi tendinosis and biceps femoris origins. Soft tissue No fluid collection or hematoma.  No soft tissue mass. IMPRESSION: 1. 14 x 14 x 17 mm heterogeneous bone lesion in the proximal left femoral diaphysis at the level of the lesser trochanter with severe surrounding T2 hyperintensity and enhancement most concerning for metastatic disease with the overall abnormality measuring 2 x 2 x 4 cm and encompassing the entire circumference of the medullary cavity. Some of the surrounding T2 hyperintensity and enhancement likely reflects reactive changes, but when correlated with recent PET-CT some of the abnormality also is favored to reflect tumor. No cortical destruction or endosteal scalloping. 2. Small ill-defined area of T2 hyperintensity along the medial margin of the ischium with enhancement on postcontrast imaging concerning for metastatic disease. Electronically Signed   By: Kathreen Devoid   On: 09/13/2017 09:12   US Biopsy (liver)  Result Date: 09/06/2017 CLINICAL DATA:  2.4 cm right upper lobe lung mass with associated hypermetabolic right paratracheal metastatic lymphadenopathy, left lobe liver metastasis and hypermetabolic bone metastases by PET scan. Additional MRI of the  abdomen demonstrates the liver lesion in segment 4A of the left lobe measuring approximately 2.2 cm and having characteristics consistent with a probable metastatic lesion. The patient presents for biopsy of the liver lesion. EXAM: ULTRASOUND GUIDED CORE BIOPSY OF LIVER MEDICATIONS: 3.5 mg IV  Versed; 150 mcg IV Fentanyl Total Moderate Sedation Time: 28 minutes. The patient's level of consciousness and physiologic status were continuously monitored during the procedure by Radiology nursing. PROCEDURE: The procedure, risks, benefits, and alternatives were explained to the patient. Questions regarding the procedure were encouraged and answered. The patient understands and consents to the procedure. A time out was performed prior to initiating the procedure. The abdominal wall was prepped with chlorhexidine in a sterile fashion, and a sterile drape was applied covering the operative field. A sterile gown and sterile gloves were used for the procedure. Local anesthesia was provided with 1% Lidocaine. Ultrasound was used to localize a lesion within the left lobe of the liver. Under direct ultrasound guidance, a 17 gauge needle was advanced to the anterior margin of the lesion. Two separate coaxial 18 gauge core biopsy samples were obtained through the lesion. Core biopsy samples were submitted in formalin. Gel-Foam pledgets were advanced through the outer needle as the needle was retracted. COMPLICATIONS: None. FINDINGS: Oval-shaped hypoechoic mass is identified in the left lobe of the liver. The surrounding liver parenchyma is echogenic, consistent with steatosis. The lesion measures approximately 2.0 x 2.6 cm in greatest dimensions by ultrasound. Solid tissue was obtained. IMPRESSION: Ultrasound-guided core biopsy performed of a lesion in the left lobe of the liver measuring 2.6 cm in greatest diameter by ultrasound. Electronically Signed   By: Aletta Edouard M.D.   On: 09/06/2017 10:21    ASSESSMENT AND PLAN: This is a very pleasant 47 years old white male with stage IV non-small cell lung cancer, adenocarcinoma with resistant EGFR mutation in exon 20 and PDL 1 expression of 5%. The patient will complete a course of palliative radiotherapy to the cervical spine as well as left hip today. I had a  lengthy discussion with the patient and his friend about his current condition and treatment options.  In the absence of actionable mutations, I recommended for the patient to proceed with the palliative systemic chemotherapy as previously discussed with carboplatin for AUC of 5, Alimta 500 mg/M2 and Keytruda 200 mg IV every 3 weeks.  He will start the first cycle of this treatment today. He was reminded of the adverse effect of this treatment. For the hiccups, I will start the patient on Thorazine 25 mg p.o. every 8 hours as needed. He will come back for follow-up visit in 3 weeks for evaluation before starting cycle #2. The patient was advised to call immediately if he has any concerning symptoms in the interval. The patient voices understanding of current disease status and treatment options and is in agreement with the current care plan.  All questions were answered. The patient knows to call the clinic with any problems, questions or concerns. We can certainly see the patient much sooner if necessary.  I spent 15 minutes counseling the patient face to face. The total time spent in the appointment was 25 minutes.  Disclaimer: This note was dictated with voice recognition software. Similar sounding words can inadvertently be transcribed and may not be corrected upon review.

## 2017-10-04 NOTE — Progress Notes (Signed)
Oncology Nurse Navigator Documentation  Oncology Nurse Navigator Flowsheets 10/04/2017  Navigator Location [facility]  Navigator Encounter Type Clinic/MDC/I spoke with patient and his friend today at clinic. He will have his first chemo treatment today. I helped educate on treatment and side effects.  He also wanted information on support services at [facility], I provided information.   Patient Visit Type MedOnc  Treatment Phase First Chemo Tx  Barriers/Navigation Needs Education  Education Other  Interventions Education  Education Method Verbal  Acuity Level 2  Time Spent with Patient 15

## 2017-10-04 NOTE — Patient Instructions (Signed)
[facility] Discharge Instructions for Patients Receiving Chemotherapy  Today you received the following chemotherapy agents: Pembrolizumab (Keytruda), Pemetrexed (Alimta), and Carboplatin (Paraplatin).   To help prevent nausea and vomiting after your treatment, we encourage you to take your nausea medication as prescribed. Received Aloxi during treatment-->take Compazine (not Zofran) for the next 3 days. If you develop nausea and vomiting that is not controlled by your nausea medication, call the clinic.   BELOW ARE SYMPTOMS THAT SHOULD BE REPORTED IMMEDIATELY:  *FEVER GREATER THAN 100.5 F  *CHILLS WITH OR WITHOUT FEVER  NAUSEA AND VOMITING THAT IS NOT CONTROLLED WITH YOUR NAUSEA MEDICATION  *UNUSUAL SHORTNESS OF BREATH  *UNUSUAL BRUISING OR BLEEDING  TENDERNESS IN MOUTH AND THROAT WITH OR WITHOUT PRESENCE OF ULCERS  *URINARY PROBLEMS  *BOWEL PROBLEMS  UNUSUAL RASH Items with * indicate a potential emergency and should be followed up as soon as possible.  Feel free to call the clinic should you have any questions or concerns. The clinic phone number is [phone].  Please show the [facility] at check-in to the Emergency Department and triage nurse.  Pembrolizumab injection What is this medicine? PEMBROLIZUMAB (pem broe liz ue mab) is a monoclonal antibody. It is used to treat melanoma, head and neck cancer, Hodgkin lymphoma, non-small cell lung cancer, urothelial cancer, stomach cancer, and cancers that have a certain genetic condition. This medicine may be used for other purposes; ask your health care provider or pharmacist if you have questions. COMMON BRAND NAME(S): Keytruda What should I tell my health care provider before I take this medicine? They need to know if you have any of these conditions: -diabetes -immune system problems -inflammatory bowel disease -liver disease -lung or breathing disease -lupus -organ transplant -an unusual or  allergic reaction to pembrolizumab, other medicines, foods, dyes, or preservatives -pregnant or trying to get pregnant -breast-feeding How should I use this medicine? This medicine is for infusion into a vein. It is given by a health care professional in a hospital or clinic setting. A special MedGuide will be given to you before each treatment. Be sure to read this information carefully each time. Talk to your pediatrician regarding the use of this medicine in children. While this drug may be prescribed for selected conditions, precautions do apply. Overdosage: If you think you have taken too much of this medicine contact a poison control center or emergency room at once. NOTE: This medicine is only for you. Do not share this medicine with others. What if I miss a dose? It is important not to miss your dose. Call your doctor or health care professional if you are unable to keep an appointment. What may interact with this medicine? Interactions have not been studied. Give your health care provider a list of all the medicines, herbs, non-prescription drugs, or dietary supplements you use. Also tell them if you smoke, drink alcohol, or use illegal drugs. Some items may interact with your medicine. This list may not describe all possible interactions. Give your health care provider a list of all the medicines, herbs, non-prescription drugs, or dietary supplements you use. Also tell them if you smoke, drink alcohol, or use illegal drugs. Some items may interact with your medicine. What should I watch for while using this medicine? Your condition will be monitored carefully while you are receiving this medicine. You may need blood work done while you are taking this medicine. Do not become pregnant while taking this medicine or for 4 months after  stopping it. Women should inform their doctor if they wish to become pregnant or think they might be pregnant. There is a potential for serious side effects to  an unborn child. Talk to your health care professional or pharmacist for more information. Do not breast-feed an infant while taking this medicine or for 4 months after the last dose. What side effects may I notice from receiving this medicine? Side effects that you should report to your doctor or health care professional as soon as possible: -allergic reactions like skin rash, itching or hives, swelling of the face, lips, or tongue -bloody or black, tarry -breathing problems -changes in vision -chest pain -chills -constipation -cough -dizziness or feeling faint or lightheaded -fast or irregular heartbeat -fever -flushing -hair loss -low blood counts - this medicine may decrease the number of white blood cells, red blood cells and platelets. You may be at increased risk for infections and bleeding. -muscle pain -muscle weakness -persistent headache -signs and symptoms of high blood sugar such as dizziness; dry mouth; dry skin; fruity breath; nausea; stomach pain; increased hunger or thirst; increased urination -signs and symptoms of kidney injury like trouble passing urine or change in the amount of urine -signs and symptoms of liver injury like dark urine, light-colored stools, loss of appetite, nausea, right upper belly pain, yellowing of the eyes or skin -stomach pain -sweating -weight loss Side effects that usually do not require medical attention (report to your doctor or health care professional if they continue or are bothersome): -decreased appetite -diarrhea -tiredness This list may not describe all possible side effects. Call your doctor for medical advice about side effects. You may report side effects to FDA at 1-800-FDA-1088. Where should I keep my medicine? This drug is given in a hospital or clinic and will not be stored at home. NOTE: This sheet is a summary. It may not cover all possible information. If you have questions about this medicine, talk to your doctor,  pharmacist, or health care provider.  2018 Elsevier/Gold Standard (2016-04-26 12:29:36)  Pemetrexed injection What is this medicine? PEMETREXED (PEM e TREX ed) is a chemotherapy drug used to treat lung cancers like non-small cell lung cancer and mesothelioma. It may also be used to treat other cancers. This medicine may be used for other purposes; ask your health care provider or pharmacist if you have questions. COMMON BRAND NAME(S): Alimta What should I tell my health care provider before I take this medicine? They need to know if you have any of these conditions: -infection (especially a virus infection such as chickenpox, cold sores, or herpes) -kidney disease -low blood counts, like low white cell, platelet, or red cell counts -lung or breathing disease, like asthma -radiation therapy -an unusual or allergic reaction to pemetrexed, other medicines, foods, dyes, or preservative -pregnant or trying to get pregnant -breast-feeding How should I use this medicine? This drug is given as an infusion into a vein. It is administered in a hospital or clinic by a specially trained health care professional. Talk to your pediatrician regarding the use of this medicine in children. Special care may be needed. Overdosage: If you think you have taken too much of this medicine contact a poison control center or emergency room at once. NOTE: This medicine is only for you. Do not share this medicine with others. What if I miss a dose? It is important not to miss your dose. Call your doctor or health care professional if you are unable to keep an appointment.  What may interact with this medicine? This medicine may interact with the following medications: -Ibuprofen This list may not describe all possible interactions. Give your health care provider a list of all the medicines, herbs, non-prescription drugs, or dietary supplements you use. Also tell them if you smoke, drink alcohol, or use illegal  drugs. Some items may interact with your medicine. What should I watch for while using this medicine? Visit your doctor for checks on your progress. This drug may make you feel generally unwell. This is not uncommon, as chemotherapy can affect healthy cells as well as cancer cells. Report any side effects. Continue your course of treatment even though you feel ill unless your doctor tells you to stop. In some cases, you may be given additional medicines to help with side effects. Follow all directions for their use. Call your doctor or health care professional for advice if you get a fever, chills or sore throat, or other symptoms of a cold or flu. Do not treat yourself. This drug decreases your body's ability to fight infections. Try to avoid being around people who are sick. This medicine may increase your risk to bruise or bleed. Call your doctor or health care professional if you notice any unusual bleeding. Be careful brushing and flossing your teeth or using a toothpick because you may get an infection or bleed more easily. If you have any dental work done, tell your dentist you are receiving this medicine. Avoid taking products that contain aspirin, acetaminophen, ibuprofen, naproxen, or ketoprofen unless instructed by your doctor. These medicines may hide a fever. Call your doctor or health care professional if you get diarrhea or mouth sores. Do not treat yourself. To protect your kidneys, drink water or other fluids as directed while you are taking this medicine. Do not become pregnant while taking this medicine or for 6 months after stopping it. Women should inform their doctor if they wish to become pregnant or think they might be pregnant. Men should not father a child while taking this medicine and for 3 months after stopping it. This may interfere with the ability to father a child. You should talk to your doctor or health care professional if you are concerned about your fertility. There is  a potential for serious side effects to an unborn child. Talk to your health care professional or pharmacist for more information. Do not breast-feed an infant while taking this medicine or for 1 week after stopping it. What side effects may I notice from receiving this medicine? Side effects that you should report to your doctor or health care professional as soon as possible: -allergic reactions like skin rash, itching or hives, swelling of the face, lips, or tongue -breathing problems -redness, blistering, peeling or loosening of the skin, including inside the mouth -signs and symptoms of bleeding such as bloody or black, tarry stools; red or dark-brown urine; spitting up blood or brown material that looks like coffee grounds; red spots on the skin; unusual bruising or bleeding from the eye, gums, or nose -signs and symptoms of infection like fever or chills; cough; sore throat; pain or trouble passing urine -signs and symptoms of kidney injury like trouble passing urine or change in the amount of urine -signs and symptoms of liver injury like dark yellow or brown urine; general ill feeling or flu-like symptoms; light-colored stools; loss of appetite; nausea; right upper belly pain; unusually weak or tired; yellowing of the eyes or skin Side effects that usually do not require  medical attention (report to your doctor or health care professional if they continue or are bothersome): -constipation -dizziness -mouth sores -nausea, vomiting -pain, tingling, numbness in the hands or feet -unusually weak or tired This list may not describe all possible side effects. Call your doctor for medical advice about side effects. You may report side effects to FDA at 1-800-FDA-1088. Where should I keep my medicine? This drug is given in a hospital or clinic and will not be stored at home. NOTE: This sheet is a summary. It may not cover all possible information. If you have questions about this medicine, talk  to your doctor, pharmacist, or health care provider.  2018 Elsevier/Gold Standard (2016-05-17 18:51:46)  Carboplatin injection What is this medicine? CARBOPLATIN (KAR boe pla tin) is a chemotherapy drug. It targets fast dividing cells, like cancer cells, and causes these cells to die. This medicine is used to treat ovarian cancer and many other cancers. This medicine may be used for other purposes; ask your health care provider or pharmacist if you have questions. COMMON BRAND NAME(S): Paraplatin What should I tell my health care provider before I take this medicine? They need to know if you have any of these conditions: -blood disorders -hearing problems -kidney disease -recent or ongoing radiation therapy -an unusual or allergic reaction to carboplatin, cisplatin, other chemotherapy, other medicines, foods, dyes, or preservatives -pregnant or trying to get pregnant -breast-feeding How should I use this medicine? This drug is usually given as an infusion into a vein. It is administered in a hospital or clinic by a specially trained health care professional. Talk to your pediatrician regarding the use of this medicine in children. Special care may be needed. Overdosage: If you think you have taken too much of this medicine contact a poison control center or emergency room at once. NOTE: This medicine is only for you. Do not share this medicine with others. What if I miss a dose? It is important not to miss a dose. Call your doctor or health care professional if you are unable to keep an appointment. What may interact with this medicine? -medicines for seizures -medicines to increase blood counts like filgrastim, pegfilgrastim, sargramostim -some antibiotics like amikacin, gentamicin, neomycin, streptomycin, tobramycin -vaccines Talk to your doctor or health care professional before taking any of these medicines: -acetaminophen -aspirin -ibuprofen -ketoprofen -naproxen This list  may not describe all possible interactions. Give your health care provider a list of all the medicines, herbs, non-prescription drugs, or dietary supplements you use. Also tell them if you smoke, drink alcohol, or use illegal drugs. Some items may interact with your medicine. What should I watch for while using this medicine? Your condition will be monitored carefully while you are receiving this medicine. You will need important blood work done while you are taking this medicine. This drug may make you feel generally unwell. This is not uncommon, as chemotherapy can affect healthy cells as well as cancer cells. Report any side effects. Continue your course of treatment even though you feel ill unless your doctor tells you to stop. In some cases, you may be given additional medicines to help with side effects. Follow all directions for their use. Call your doctor or health care professional for advice if you get a fever, chills or sore throat, or other symptoms of a cold or flu. Do not treat yourself. This drug decreases your body's ability to fight infections. Try to avoid being around people who are sick. This medicine may increase your  risk to bruise or bleed. Call your doctor or health care professional if you notice any unusual bleeding. Be careful brushing and flossing your teeth or using a toothpick because you may get an infection or bleed more easily. If you have any dental work done, tell your dentist you are receiving this medicine. Avoid taking products that contain aspirin, acetaminophen, ibuprofen, naproxen, or ketoprofen unless instructed by your doctor. These medicines may hide a fever. Do not become pregnant while taking this medicine. Women should inform their doctor if they wish to become pregnant or think they might be pregnant. There is a potential for serious side effects to an unborn child. Talk to your health care professional or pharmacist for more information. Do not breast-feed an  infant while taking this medicine. What side effects may I notice from receiving this medicine? Side effects that you should report to your doctor or health care professional as soon as possible: -allergic reactions like skin rash, itching or hives, swelling of the face, lips, or tongue -signs of infection - fever or chills, cough, sore throat, pain or difficulty passing urine -signs of decreased platelets or bleeding - bruising, pinpoint red spots on the skin, black, tarry stools, nosebleeds -signs of decreased red blood cells - unusually weak or tired, fainting spells, lightheadedness -breathing problems -changes in hearing -changes in vision -chest pain -high blood pressure -low blood counts - This drug may decrease the number of white blood cells, red blood cells and platelets. You may be at increased risk for infections and bleeding. -nausea and vomiting -pain, swelling, redness or irritation at the injection site -pain, tingling, numbness in the hands or feet -problems with balance, talking, walking -trouble passing urine or change in the amount of urine Side effects that usually do not require medical attention (report to your doctor or health care professional if they continue or are bothersome): -hair loss -loss of appetite -metallic taste in the mouth or changes in taste This list may not describe all possible side effects. Call your doctor for medical advice about side effects. You may report side effects to FDA at 1-800-FDA-1088. Where should I keep my medicine? This drug is given in a hospital or clinic and will not be stored at home. NOTE: This sheet is a summary. It may not cover all possible information. If you have questions about this medicine, talk to your doctor, pharmacist, or health care provider.  2018 Elsevier/Gold Standard (2007-10-23 14:38:05)

## 2017-10-04 NOTE — Telephone Encounter (Signed)
Per 3/6 los - appts already scheduled 3 cycles.

## 2017-10-05 ENCOUNTER — Encounter: Payer: Self-pay | Admitting: Radiation Oncology

## 2017-10-05 NOTE — Progress Notes (Signed)
  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 During treatment the patient complained of a mild hoarse voice, and an occasional sore throat. He complained of tiredness to his neck and leg at the end of the day. Erythema was noted to the neck without skin breakdown. Did notice improvement in his neck mobility and pain level.  Plan: The patient has completed radiation treatment. The patient will return to radiation oncology clinic for routine followup in one month. I advised them to call or return sooner if they have any questions or concerns related to their recovery or treatment.  -----------------------------------  Blair Promise, PhD, MD  This document serves as a record of services personally performed by Gery Pray, MD. It was created on his behalf by Bethann Humble, a trained medical scribe. The creation of this record is based on the scribe's personal observations and the provider's statements to them. This document has been checked and approved by the attending provider.

## 2017-10-06 ENCOUNTER — Encounter: Payer: Self-pay | Admitting: Internal Medicine

## 2017-10-06 NOTE — Progress Notes (Signed)
Pt was approved w/ [employer] for Alimta for $25,000 from 06/05/17 through 10/03/18.

## 2017-10-11 ENCOUNTER — Inpatient Hospital Stay: Payer: BLUE CROSS/BLUE SHIELD

## 2017-10-11 DIAGNOSIS — C3491 Malignant neoplasm of unspecified part of right bronchus or lung: Secondary | ICD-10-CM

## 2017-10-11 DIAGNOSIS — C3411 Malignant neoplasm of upper lobe, right bronchus or lung: Secondary | ICD-10-CM | POA: Diagnosis not present

## 2017-10-11 LAB — CMP (CANCER CENTER ONLY)
ALT: 34 U/L (ref 0–55)
AST: 15 U/L (ref 5–34)
Albumin: 3.7 g/dL (ref 3.5–5.0)
Alkaline Phosphatase: 55 U/L (ref 40–150)
Anion gap: 6 (ref 3–11)
BUN: 20 mg/dL (ref 7–26)
CALCIUM: 9.9 mg/dL (ref 8.4–10.4)
CO2: 31 mmol/L — AB (ref 22–29)
Chloride: 98 mmol/L (ref 98–109)
Creatinine: 1.09 mg/dL (ref 0.70–1.30)
GFR, Estimated: >60 mL/min (ref >60)
GLUCOSE: 105 mg/dL (ref 70–140)
Potassium: 4.5 mmol/L (ref 3.5–5.1)
SODIUM: 135 mmol/L — AB (ref 136–145)
Total Bilirubin: 1.4 mg/dL — ABNORMAL HIGH (ref 0.2–1.2)
Total Protein: 7 g/dL (ref 6.4–8.3)

## 2017-10-11 LAB — CBC WITH DIFFERENTIAL (CANCER CENTER ONLY)
BASOS PCT: 0 %
Basophils Absolute: 0 10*3/uL (ref 0.0–0.1)
EOS PCT: 5 %
Eosinophils Absolute: 0.1 10*3/uL (ref 0.0–0.5)
HCT: 42.4 % (ref 38.4–49.9)
Hemoglobin: 14.7 g/dL (ref 13.0–17.1)
Lymphocytes Relative: 26 %
Lymphs Abs: 0.8 10*3/uL — ABNORMAL LOW (ref 0.9–3.3)
MCH: 30.4 pg (ref 27.2–33.4)
MCHC: 34.7 g/dL (ref 32.0–36.0)
MCV: 87.8 fL (ref 79.3–98.0)
MONO ABS: 0.3 10*3/uL (ref 0.1–0.9)
MONOS PCT: 9 %
NEUTROS PCT: 60 %
Neutro Abs: 1.8 10*3/uL (ref 1.5–6.5)
PLATELETS: 219 10*3/uL (ref 140–400)
RBC: 4.83 MIL/uL (ref 4.20–5.82)
RDW: 13.2 % (ref 11.0–14.6)
WBC Count: 3 10*3/uL — ABNORMAL LOW (ref 4.0–10.3)

## 2017-10-11 LAB — RESEARCH LABS

## 2017-10-12 ENCOUNTER — Telehealth: Payer: Self-pay | Admitting: *Deleted

## 2017-10-12 ENCOUNTER — Telehealth: Payer: Self-pay | Admitting: Oncology

## 2017-10-12 MED ORDER — SUCRALFATE 1 G PO TABS: 1.0000 g | ORAL_TABLET | Freq: Three times a day (TID) | ORAL | 1 refills | Status: DC

## 2017-10-12 NOTE — Telephone Encounter (Signed)
Patient called and said his throat has been sore with swallowing and when he first lays down at night.  He has been taking chloraseptic tablets which help a little.  Prescription has been sent to [facility] for Carafate per Dr. Sondra Come.  Patient was notified that is has been sent in and has been educated on how to take them.  He will call back if they do not help.

## 2017-10-12 NOTE — Telephone Encounter (Signed)
Oncology Nurse Navigator Documentation  Oncology Nurse Navigator Flowsheets 10/12/2017  Navigator Location [facility]  Navigator Encounter Type Telephone/I received call from Anthony [name]. I called him back. He states he is having some trouble with painful swallowing due to radiation. I gave him the number to call to update radiation and get help with painful swallowing  Telephone Outgoing Call  Treatment Phase Treatment  Barriers/Navigation Needs Education  Education Other  Interventions Education  Education Method Verbal  Acuity Level 1  Time Spent with Patient 15

## 2017-10-13 ENCOUNTER — Telehealth: Payer: Self-pay | Admitting: Oncology

## 2017-10-13 ENCOUNTER — Other Ambulatory Visit: Payer: Self-pay | Admitting: Radiation Oncology

## 2017-10-13 DIAGNOSIS — K209 Esophagitis, unspecified without bleeding: Secondary | ICD-10-CM

## 2017-10-13 DIAGNOSIS — C801 Malignant (primary) neoplasm, unspecified: Secondary | ICD-10-CM

## 2017-10-13 MED ORDER — LIDOCAINE VISCOUS 2 % MT SOLN: OROMUCOSAL | 3 refills | Status: DC

## 2017-10-13 NOTE — Telephone Encounter (Signed)
Patient called and said the Carafate did not help at all with his sore throat.  Notified Dr. Isidore Moos who will send in a prescription for viscous lidocaine with instructions for patient to dilute it with water (50/50 strength).  Called patient back and advised him about the lidocaine prescription.

## 2017-10-16 ENCOUNTER — Ambulatory Visit
Admission: RE | Admit: 2017-10-16 | Discharge: 2017-10-16 | Disposition: A | Discharge status: Home / Self Care | Payer: BLUE CROSS/BLUE SHIELD | Source: Ambulatory Visit | Attending: Radiation Oncology | Admitting: Radiation Oncology

## 2017-10-16 ENCOUNTER — Telehealth: Payer: Self-pay | Admitting: Oncology

## 2017-10-16 ENCOUNTER — Encounter: Payer: Self-pay | Admitting: Oncology

## 2017-10-16 ENCOUNTER — Encounter: Payer: Self-pay | Admitting: Internal Medicine

## 2017-10-16 VITALS — BP 120/77 | HR 71 | Temp 99.1°F | Ht 70.0 in | Wt 200.6 lb

## 2017-10-16 DIAGNOSIS — Z79899 Other long term (current) drug therapy: Secondary | ICD-10-CM | POA: Insufficient documentation

## 2017-10-16 DIAGNOSIS — C7951 Secondary malignant neoplasm of bone: Secondary | ICD-10-CM | POA: Diagnosis not present

## 2017-10-16 DIAGNOSIS — C3490 Malignant neoplasm of unspecified part of unspecified bronchus or lung: Secondary | ICD-10-CM | POA: Insufficient documentation

## 2017-10-16 DIAGNOSIS — C3491 Malignant neoplasm of unspecified part of right bronchus or lung: Secondary | ICD-10-CM

## 2017-10-16 DIAGNOSIS — Z7982 Long term (current) use of aspirin: Secondary | ICD-10-CM | POA: Diagnosis not present

## 2017-10-16 DIAGNOSIS — R070 Pain in throat: Secondary | ICD-10-CM | POA: Diagnosis not present

## 2017-10-16 DIAGNOSIS — Z7902 Long term (current) use of antithrombotics/antiplatelets: Secondary | ICD-10-CM | POA: Insufficient documentation

## 2017-10-16 DIAGNOSIS — R634 Abnormal weight loss: Secondary | ICD-10-CM | POA: Insufficient documentation

## 2017-10-16 HISTORY — DX: Personal history of irradiation: Z92.3

## 2017-10-16 MED ORDER — OXYCODONE-ACETAMINOPHEN 5-325 MG PO TABS: 1.0000 | ORAL_TABLET | ORAL | 0 refills | Status: DC | PRN

## 2017-10-16 NOTE — Progress Notes (Signed)
Pt was approved w/ [employer] for [employer] for $25,000 from 08/01/17 through 07/31/18.

## 2017-10-16 NOTE — Telephone Encounter (Signed)
Patient called and said the lidocaine did numb his throat so he was able to eat.  However, when it wore off, he had burning pain which was the worst that he is ever felt.  He is wondering if he can see Dr. Sondra Come today to talk about other treatments for his sore throat.  Appointment made for 2 pm today.

## 2017-10-16 NOTE — Progress Notes (Addendum)
Anthony [name] is here for follow up.  He reports having severe throat pain with swallowing/talking.  He has tried taking Carafate and lidocaine which gives him minimal relief.  He stopped taking them on Saturday. He said the pain comes back worse than before after the medication wears off.  He is now taking 1000 mg of tylenol and chloroseptic throat lozenges which allows him to be able to sleep.  He also said that it feels like his lymph nodes in his throat are swollen.  He has noticed a white streak in the left side of his mouth.  He has lost about 7 lbs and said the only thing he can eat is peanut butter and honey sandwiches.  Orthostatic vitals taken: bp sitting 120/77, hr 71, bp standing 121/80, hr 72.  He does report that his left leg is feeling better and he is able to walk 3-4 miles a day.  He reports feeling fatigued because he is not able to eat well.  The skin on his neck is pink.  He started chemotherapy on 10/04/17.  BP 120/77 (BP Location: Right Arm, Patient Position: Sitting)   Pulse 71   Temp 99.1 F (37.3 C) (Oral)   Ht 5\' 10"  (1.778 m)   Wt 200 lb 9.6 oz (91 kg)   SpO2 100%   BMI 28.78 kg/m   Wt Readings from Last 3 Encounters:  10/16/17 200 lb 9.6 oz (91 kg)  10/04/17 211 lb 9.6 oz (96 kg)  09/20/17 207 lb 9.6 oz (94.2 kg)

## 2017-10-16 NOTE — Progress Notes (Signed)
Radiation Oncology         [phone] ________________________________  Name: Anthony [name] MRN: [id number]  Date: 10/16/2017  DOB: 04-08-1971  Follow-Up Visit Note  CC: [name], MD  Grace Isaac, MD    ICD-10-CM   1. 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 The patient reports he has severe throat pain with swallowing and talking. He notes 5/10 pain with talking and 7-10/10 pain with swallowing. He has tried taking Carafate and Lidocaine with minimal relief. He stopped taking them on Saturday. He noted the pain is 3-4 times worse after the medication wears off. He is now taking 1000 mg of Tylenol and chloraseptic throat lozenges which allows him to be able to sleep. He also notes it feels as though the lymph nodes in the throat are swollen. He notes a 7 pound weight loss and reports he can only eat peanut butter and honey sandwiches. He reports fatigue resulting from reduced food intake. He reports his left leg is feeling better and he is able to walk 3-4 miles per day. He started chemotherapy on 10/04/17.                           ALLERGIES:  has No Known Allergies.  Meds: Current Outpatient Medications  Medication Sig Dispense Refill  . aspirin EC 81 MG EC tablet Take 1 tablet (81 mg total) by mouth daily.    Marland Kitchen atorvastatin (LIPITOR) 80 MG tablet Take 1 tablet (80 mg total) by mouth daily at 6 PM. 30 tablet 3  . cetirizine (ZYRTEC) 10 MG tablet Take 10 mg by mouth daily.    . clopidogrel (PLAVIX) 75 MG tablet Take 1 tablet (75 mg total) by mouth daily. 30 tablet 3  . dexamethasone (DECADRON) 4 MG tablet Take 1 tablet twice a day the day before, day of, and day  after each cycle of chemotherapy. 20 tablet 1  . esomeprazole (NEXIUM) 20 MG capsule Take 20 mg by mouth every other day.     . folic acid (FOLVITE) 1 MG tablet Take 1 tablet (1 mg total) by mouth daily. 30 tablet 2  . lisinopril (PRINIVIL,ZESTRIL) 5 MG tablet Take 1 tablet (5 mg total) by mouth daily. 30 tablet 3  . metoprolol tartrate (LOPRESSOR) 25 MG tablet Take 0.5 tablets (12.5 mg total) by mouth 2 (two) times daily. 30 tablet 3  . prochlorperazine (COMPAZINE) 10 MG tablet Take 1 tablet (10 mg total) by mouth every 6 (six) hours as needed for nausea or vomiting. 30 tablet 1  . sucralfate (CARAFATE) 1 g tablet Take 1 tablet (1 g total) by mouth 4 (four) times daily -  with meals and at bedtime. Dissolve tablet in 15 ml of water before taking 60 tablet 1  . chlorproMAZINE (THORAZINE) 25 MG tablet Take 1 tablet (25 mg total) by mouth 3 (three) times daily as needed. (Patient not taking: Reported on 10/16/2017) 30 tablet 0  . lidocaine (XYLOCAINE) 2 % solution Patient: Mix 1part 2% viscous lidocaine, 1part H20. Swallow 30mL of diluted mixture, 6min before meals and at bedtime, up to QID (Patient not taking:  Reported on 10/16/2017) 100 mL 3  . loratadine (CLARITIN) 10 MG tablet Take 10 mg daily as needed by mouth for allergies.    . Wound Dressings (SONAFINE EX) Apply topically.     No current facility-administered medications for this encounter.     Physical Findings: The patient is in no acute distress. Patient is alert and oriented.  height is 5\' 10"  (1.778 m) and weight is 200 lb 9.6 oz (91 kg). His oral temperature is 99.1 F (37.3 C). His blood pressure is 120/77 and his pulse is 71. His oxygen saturation is 100%. .  No significant changes. Lungs are clear to auscultation bilaterally. Heart has regular rate and rhythm. No palpable cervical, supraclavicular, or axillary adenopathy. Abdomen soft, non-tender, normal bowel sounds. Oral cavity is moist without secondary infection.    Lab  Findings: Lab Results  Component Value Date   WBC 3.0 (L) 10/11/2017   HGB 15.2 09/06/2017   HCT 42.4 10/11/2017   MCV 87.8 10/11/2017   PLT 219 10/11/2017    Radiographic Findings: No results found.  Impression:  The patient continues to have significant throat pain. He has lost weight due to this issue.   Plan: Routine follow-up in 3 months. I prescribed Percocet for throat pain. The patient will reduce his Tylenol while taking Percocet.  ____________________________________  This document serves as a record of services personally performed by Gery Pray, MD. It was created on his behalf by Bethann Humble, a trained medical scribe. The creation of this record is based on the scribe's personal observations and the provider's statements to them. This document has been checked and approved by the attending provider.

## 2017-10-17 ENCOUNTER — Other Ambulatory Visit: Payer: Self-pay | Admitting: Physician Assistant

## 2017-10-18 ENCOUNTER — Inpatient Hospital Stay: Payer: BLUE CROSS/BLUE SHIELD

## 2017-10-18 DIAGNOSIS — C3411 Malignant neoplasm of upper lobe, right bronchus or lung: Secondary | ICD-10-CM | POA: Diagnosis not present

## 2017-10-18 DIAGNOSIS — C3491 Malignant neoplasm of unspecified part of right bronchus or lung: Secondary | ICD-10-CM

## 2017-10-18 LAB — CMP (CANCER CENTER ONLY)
ALT: 20 U/L (ref 0–55)
AST: 14 U/L (ref 5–34)
Albumin: 3.5 g/dL (ref 3.5–5.0)
Alkaline Phosphatase: 53 U/L (ref 40–150)
Anion gap: 7 (ref 3–11)
BILIRUBIN TOTAL: 0.6 mg/dL (ref 0.2–1.2)
BUN: 11 mg/dL (ref 7–26)
CO2: 29 mmol/L (ref 22–29)
Calcium: 9.6 mg/dL (ref 8.4–10.4)
Chloride: 103 mmol/L (ref 98–109)
Creatinine: 0.97 mg/dL (ref 0.70–1.30)
GFR, Est AFR Am: >60 mL/min (ref >60)
GLUCOSE: 90 mg/dL (ref 70–140)
POTASSIUM: 4.2 mmol/L (ref 3.5–5.1)
Sodium: 139 mmol/L (ref 136–145)
TOTAL PROTEIN: 6.8 g/dL (ref 6.4–8.3)

## 2017-10-18 LAB — CBC WITH DIFFERENTIAL (CANCER CENTER ONLY)
BASOS ABS: 0 10*3/uL (ref 0.0–0.1)
Basophils Relative: 1 %
Eosinophils Absolute: 0 10*3/uL (ref 0.0–0.5)
Eosinophils Relative: 1 %
HEMATOCRIT: 36.8 % — AB (ref 38.4–49.9)
HEMOGLOBIN: 12.8 g/dL — AB (ref 13.0–17.1)
LYMPHS PCT: 34 %
Lymphs Abs: 1.1 10*3/uL (ref 0.9–3.3)
MCH: 30.2 pg (ref 27.2–33.4)
MCHC: 34.8 g/dL (ref 32.0–36.0)
MCV: 86.8 fL (ref 79.3–98.0)
MONO ABS: 0.4 10*3/uL (ref 0.1–0.9)
Monocytes Relative: 14 %
NEUTROS ABS: 1.6 10*3/uL (ref 1.5–6.5)
NEUTROS PCT: 50 %
Platelet Count: 140 10*3/uL (ref 140–400)
RBC: 4.24 MIL/uL (ref 4.20–5.82)
RDW: 13 % (ref 11.0–14.6)
WBC: 3.1 10*3/uL — AB (ref 4.0–10.3)

## 2017-10-25 ENCOUNTER — Inpatient Hospital Stay (HOSPITAL_BASED_OUTPATIENT_CLINIC_OR_DEPARTMENT_OTHER): Payer: BLUE CROSS/BLUE SHIELD | Admitting: Oncology

## 2017-10-25 ENCOUNTER — Encounter: Payer: Self-pay | Admitting: Oncology

## 2017-10-25 ENCOUNTER — Inpatient Hospital Stay: Payer: BLUE CROSS/BLUE SHIELD

## 2017-10-25 ENCOUNTER — Telehealth: Payer: Self-pay | Admitting: Oncology

## 2017-10-25 VITALS — BP 121/74 | HR 56 | Temp 98.2°F | Resp 20 | Ht 70.0 in | Wt 206.4 lb

## 2017-10-25 DIAGNOSIS — I252 Old myocardial infarction: Secondary | ICD-10-CM | POA: Diagnosis not present

## 2017-10-25 DIAGNOSIS — Z79899 Other long term (current) drug therapy: Secondary | ICD-10-CM

## 2017-10-25 DIAGNOSIS — C3491 Malignant neoplasm of unspecified part of right bronchus or lung: Secondary | ICD-10-CM

## 2017-10-25 DIAGNOSIS — C3411 Malignant neoplasm of upper lobe, right bronchus or lung: Secondary | ICD-10-CM | POA: Diagnosis not present

## 2017-10-25 DIAGNOSIS — Z923 Personal history of irradiation: Secondary | ICD-10-CM

## 2017-10-25 DIAGNOSIS — Z7982 Long term (current) use of aspirin: Secondary | ICD-10-CM | POA: Diagnosis not present

## 2017-10-25 DIAGNOSIS — C7951 Secondary malignant neoplasm of bone: Secondary | ICD-10-CM

## 2017-10-25 DIAGNOSIS — K219 Gastro-esophageal reflux disease without esophagitis: Secondary | ICD-10-CM

## 2017-10-25 DIAGNOSIS — Z5111 Encounter for antineoplastic chemotherapy: Secondary | ICD-10-CM

## 2017-10-25 DIAGNOSIS — C787 Secondary malignant neoplasm of liver and intrahepatic bile duct: Secondary | ICD-10-CM

## 2017-10-25 DIAGNOSIS — Z5112 Encounter for antineoplastic immunotherapy: Secondary | ICD-10-CM

## 2017-10-25 LAB — CMP (CANCER CENTER ONLY)
ALBUMIN: 3.8 g/dL (ref 3.5–5.0)
ALK PHOS: 66 U/L (ref 40–150)
ALT: 82 U/L — ABNORMAL HIGH (ref 0–55)
AST: 21 U/L (ref 5–34)
Anion gap: 10 (ref 3–11)
BILIRUBIN TOTAL: 0.4 mg/dL (ref 0.2–1.2)
BUN: 10 mg/dL (ref 7–26)
CALCIUM: 10.1 mg/dL (ref 8.4–10.4)
CO2: 25 mmol/L (ref 22–29)
Chloride: 106 mmol/L (ref 98–109)
Creatinine: 0.82 mg/dL (ref 0.70–1.30)
Glucose, Bld: 123 mg/dL (ref 70–140)
POTASSIUM: 3.8 mmol/L (ref 3.5–5.1)
Sodium: 141 mmol/L (ref 136–145)
TOTAL PROTEIN: 7.4 g/dL (ref 6.4–8.3)

## 2017-10-25 LAB — CBC WITH DIFFERENTIAL (CANCER CENTER ONLY)
BASOS ABS: 0 10*3/uL (ref 0.0–0.1)
BASOS PCT: 0 %
Eosinophils Absolute: 0 10*3/uL (ref 0.0–0.5)
Eosinophils Relative: 0 %
HEMATOCRIT: 40.9 % (ref 38.4–49.9)
HEMOGLOBIN: 14.5 g/dL (ref 13.0–17.1)
LYMPHS PCT: 10 %
Lymphs Abs: 0.7 10*3/uL — ABNORMAL LOW (ref 0.9–3.3)
MCH: 31 pg (ref 27.2–33.4)
MCHC: 35.5 g/dL (ref 32.0–36.0)
MCV: 87.4 fL (ref 79.3–98.0)
MONO ABS: 0.7 10*3/uL (ref 0.1–0.9)
Monocytes Relative: 9 %
NEUTROS ABS: 6.1 10*3/uL (ref 1.5–6.5)
NEUTROS PCT: 81 %
Platelet Count: 418 10*3/uL — ABNORMAL HIGH (ref 140–400)
RBC: 4.68 MIL/uL (ref 4.20–5.82)
RDW: 13.8 % (ref 11.0–14.6)
WBC: 7.6 10*3/uL (ref 4.0–10.3)

## 2017-10-25 LAB — TSH: TSH: 0.15 u[IU]/mL — ABNORMAL LOW (ref 0.320–4.118)

## 2017-10-25 MED ORDER — SODIUM CHLORIDE 0.9 % IV SOLN
Freq: Once | INTRAVENOUS | Status: AC
  Administered 2017-10-25: 10:00:00 via INTRAVENOUS
  Filled 2017-10-25: qty 5

## 2017-10-25 MED ORDER — SODIUM CHLORIDE 0.9 % IV SOLN
750.0000 mg | Freq: Once | INTRAVENOUS | Status: AC
  Administered 2017-10-25: 750 mg via INTRAVENOUS
  Filled 2017-10-25: qty 75

## 2017-10-25 MED ORDER — PALONOSETRON HCL INJECTION 0.25 MG/5ML
0.2500 mg | Freq: Once | INTRAVENOUS | Status: AC
  Administered 2017-10-25: 0.25 mg via INTRAVENOUS

## 2017-10-25 MED ORDER — SODIUM CHLORIDE 0.9 % IV SOLN
200.0000 mg | Freq: Once | INTRAVENOUS | Status: AC
  Administered 2017-10-25: 200 mg via INTRAVENOUS
  Filled 2017-10-25: qty 8

## 2017-10-25 MED ORDER — SODIUM CHLORIDE 0.9 % IV SOLN
1100.0000 mg | Freq: Once | INTRAVENOUS | Status: AC
  Administered 2017-10-25: 1100 mg via INTRAVENOUS
  Filled 2017-10-25: qty 40

## 2017-10-25 MED ORDER — SODIUM CHLORIDE 0.9 % IV SOLN
Freq: Once | INTRAVENOUS | Status: AC
  Administered 2017-10-25: 10:00:00 via INTRAVENOUS

## 2017-10-25 MED ORDER — PALONOSETRON HCL INJECTION 0.25 MG/5ML
INTRAVENOUS | Status: AC
Start: 2017-10-25 — End: ?
  Filled 2017-10-25: qty 5

## 2017-10-25 NOTE — Telephone Encounter (Signed)
Scheduled appt per 3/27 los - patient to get an updated schedule in the treatment area.

## 2017-10-25 NOTE — Progress Notes (Signed)
[facility] OFFICE PROGRESS NOTE  Anthony Cha, Anthony [name] [address] [place] [address]  DIAGNOSIS: Stage IVB(T1b, N2, M1c)non-small cell lung cancer, adenocarcinoma presented with right upper lobe lung nodule in addition to right hilar and mediastinal lymphadenopathy as well as metastatic liver and bone disease diagnosed in February 2019.  Biomarker Findings Microsatellite status - MS-Stable Tumor Mutational Burden - TMB-Low (4 Muts/Mb) Genomic Findings For a complete list of the genes assayed, please refer to the Appendix. EGFR exon 20 insertion ([id number]) RB1 loss exons 9-17 TP53 [id number] 7 Disease relevant genes with no reportable alterations: KRAS, ALK, BRAF, MET, RET, ERBB2, ROS1  PDL 1 expression 5%  PRIOR THERAPY: Palliative radiotherapy to the metastatic bone lesions in the cervical spine and left femur under the care of Dr. Sondra Come.  CURRENT THERAPY: Systemic chemotherapy with carboplatin for AC of 5, Alimta 500 mg/M2 and Keytruda 200 mg IV every 3 weeks. First dose October 04, 2017.  Status post 1 cycle.  INTERVAL HISTORY: Anthony [name] 47 y.o. male returns for a routine follow-up visit by himself.  Patient is feeling fine today with no specific complaints.  He tolerated his first cycle of chemotherapy fairly well.  He experienced some mild fatigue and intermittent nausea that was resolved with antiemetics.  The patient had pain in his throat secondary to radiation which has now resolved.  He is starting to eat and drink better.  He is gaining back some of his lost weight.  His hiccups have improved.  Denies fevers and chills.  Denies chest pain, shortness of breath, cough, hemoptysis.  Denies nausea, vomiting, constipation, diarrhea.  The patient is here for evaluation prior to cycle 2 of his chemotherapy.  MEDICAL HISTORY: Past Medical History:  Diagnosis Date  . Acid reflux   . History of radiation therapy 09/21/17-10/04/17   C4  spine 30 Gy in 10 fractions, left femur 30 Gy in 10 fractions  . Non-small cell lung cancer ([place])   . NSTEMI (non-ST elevated myocardial infarction) ([place]) 06/13/2017   [name] 06/14/2017  . Tailbone injury since 1993   "cracked"    ALLERGIES:  has No Known Allergies.  MEDICATIONS:  Current Outpatient Medications  Medication Sig Dispense Refill  . aspirin EC 81 MG EC tablet Take 1 tablet (81 mg total) by mouth daily.    Marland Kitchen atorvastatin (LIPITOR) 80 MG tablet Take 1 tablet (80 mg total) by mouth daily at 6 PM. 30 tablet 3  . cetirizine (ZYRTEC) 10 MG tablet Take 10 mg by mouth daily.    . chlorproMAZINE (THORAZINE) 25 MG tablet Take 1 tablet (25 mg total) by mouth 3 (three) times daily as needed. 30 tablet 0  . clopidogrel (PLAVIX) 75 MG tablet Take 1 tablet (75 mg total) by mouth daily. 30 tablet 3  . dexamethasone (DECADRON) 4 MG tablet Take 1 tablet twice a day the day before, day of, and day after each cycle of chemotherapy. 20 tablet 1  . esomeprazole (NEXIUM) 20 MG capsule Take 20 mg by mouth every other day.     . folic acid (FOLVITE) 1 MG tablet Take 1 tablet (1 mg total) by mouth daily. 30 tablet 2  . lisinopril (PRINIVIL,ZESTRIL) 5 MG tablet Take 1 tablet (5 mg total) by mouth daily. 30 tablet 3  . loratadine (CLARITIN) 10 MG tablet Take 10 mg daily as needed by mouth for allergies.    . metoprolol tartrate (LOPRESSOR) 25 MG tablet Take 0.5 tablets (12.5 mg total)  by mouth 2 (two) times daily. 30 tablet 3  . prochlorperazine (COMPAZINE) 10 MG tablet Take 1 tablet (10 mg total) by mouth every 6 (six) hours as needed for nausea or vomiting. 30 tablet 1  . Wound Dressings (SONAFINE EX) Apply topically.    Marland Kitchen oxyCODONE-acetaminophen (PERCOCET/ROXICET) 5-325 MG tablet Take 1 tablet by mouth every 4 (four) hours as needed for severe pain. (Patient not taking: Reported on 10/25/2017) 30 tablet 0   No current facility-administered medications for this visit.     SURGICAL HISTORY:  Past  Surgical History:  Procedure Laterality Date  . CARDIAC CATHETERIZATION  06/14/2017  . CORONARY ARTERY BYPASS GRAFT N/A 06/19/2017   Procedure: CORONARY ARTERY BYPASS GRAFTING (CABG), OFF PUMP, times one using the left internal mammary artery to LAD;  Surgeon: Grace Isaac, Anthony [name];  Location: [facility];  Service: Open Heart Surgery;  Laterality: N/A;  . LEFT HEART CATH AND CORONARY ANGIOGRAPHY N/A 06/14/2017   Procedure: LEFT HEART CATH AND CORONARY ANGIOGRAPHY;  Surgeon: Sherren Mocha, Anthony [name];  Location: [facility];  Service: Cardiovascular;  Laterality: N/A;  . TEE WITHOUT CARDIOVERSION N/A 06/19/2017   Procedure: TRANSESOPHAGEAL ECHOCARDIOGRAM (TEE);  Surgeon: Grace Isaac, Anthony [name];  Location: [facility];  Service: Open Heart Surgery;  Laterality: N/A;  . TONSILLECTOMY  ~ 1977    REVIEW OF SYSTEMS:   Review of Systems  Constitutional: Negative for appetite change, chills, fatigue, fever. He is gaining back some of his lost weight. HENT:   Negative for mouth sores, nosebleeds, sore throat and trouble swallowing.   Eyes: Negative for eye problems and icterus.  Respiratory: Negative for cough, hemoptysis, shortness of breath and wheezing.   Cardiovascular: Negative for chest pain and leg swelling.  Gastrointestinal: Negative for abdominal pain, constipation, diarrhea, nausea and vomiting.  Genitourinary: Negative for bladder incontinence, difficulty urinating, dysuria, frequency and hematuria.   Musculoskeletal: Negative for back pain, gait problem, neck pain and neck stiffness.  Skin: Negative for itching and rash.  Neurological: Negative for dizziness, extremity weakness, gait problem, headaches, light-headedness and seizures.  Hematological: Negative for adenopathy. Does not bruise/bleed easily.  Psychiatric/Behavioral: Negative for confusion, depression and sleep disturbance. The patient is not nervous/anxious.     PHYSICAL EXAMINATION:  Blood pressure 121/74, pulse (!) 56,  temperature 98.2 F (36.8 C), temperature source Oral, resp. rate 20, height _0  (1.778 m), weight 206 lb 6.4 oz (93.6 kg), SpO2 98 %.  ECOG PERFORMANCE STATUS: 1 - Symptomatic but completely ambulatory  Physical Exam  Constitutional: Oriented to person, place, and time and well-developed, well-nourished, and in no distress. No distress.  HENT:  Head: Normocephalic and atraumatic.  Mouth/Throat: Oropharynx is clear and moist. No oropharyngeal exudate.  Eyes: Conjunctivae are normal. Right eye exhibits no discharge. Left eye exhibits no discharge. No scleral icterus.  Neck: Normal range of motion. Neck supple.  Cardiovascular: Normal rate, regular rhythm, normal heart sounds and intact distal pulses.   Pulmonary/Chest: Effort normal and breath sounds normal. No respiratory distress. No wheezes. No rales.  Abdominal: Soft. Bowel sounds are normal. Exhibits no distension and no mass. There is no tenderness.  Musculoskeletal: Normal range of motion. Exhibits no edema.  Lymphadenopathy:    No cervical adenopathy.  Neurological: Alert and oriented to person, place, and time. Exhibits normal muscle tone. Gait normal. Coordination normal.  Skin: Skin is warm and dry. No rash noted. Not diaphoretic. No erythema. No pallor.  Psychiatric: Mood, memory and judgment normal.  Vitals reviewed.  LABORATORY  DATA: Lab Results  Component Value Date   WBC 7.6 10/25/2017   HGB 15.2 09/06/2017   HCT 40.9 10/25/2017   MCV 87.4 10/25/2017   PLT 418 (H) 10/25/2017      Chemistry      Component Value Date/Time   NA 141 10/25/2017 0808   NA 141 07/17/2017 1006   K 3.8 10/25/2017 0808   CL 106 10/25/2017 0808   CO2 25 10/25/2017 0808   BUN 10 10/25/2017 0808   BUN 12 07/17/2017 1006   CREATININE 0.82 10/25/2017 0808      Component Value Date/Time   CALCIUM 10.1 10/25/2017 0808   ALKPHOS 66 10/25/2017 0808   AST 21 10/25/2017 0808   ALT 82 (H) 10/25/2017 0808   BILITOT 0.4 10/25/2017 0808        RADIOGRAPHIC STUDIES:  No results found.   ASSESSMENT/PLAN:  Stage 4 lung cancer, right ([place]) This is a very pleasant 47 year old white male with stage IV non-small cell lung cancer, adenocarcinoma with resistant EGFR mutation in exon 20 and PDL 1 expression of 5%. The patient recently completed a course of palliative radiotherapy to the cervical spine. Patient is now on palliative systemic chemotherapy  with carboplatin for AUC of 5, Alimta 500 mg/M2 and Keytruda 200 mg IV every 3 weeks.  Status post 1 cycle which she tolerated fairly well with no concerning complaints. Recommend that he proceed with cycle 2 as scheduled today. He will continue to have weekly labs. He will come back for follow-up visit in 3 weeks for evaluation before starting cycle #3.  The patient was advised to call immediately if he has any concerning symptoms in the interval. The patient voices understanding of current disease status and treatment options and is in agreement with the current care plan.  All questions were answered. The patient knows to call the clinic with any problems, questions or concerns. We can certainly see the patient much sooner if necessary.   No orders of the defined types were placed in this encounter.  Mikey Bussing, DNP, AGPCNP-BC, AOCNP 10/25/17

## 2017-10-25 NOTE — Assessment & Plan Note (Signed)
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 The patient recently completed a course of palliative radiotherapy to the cervical spine. Patient is now on palliative systemic chemotherapy  with carboplatin for AUC of 5, Alimta 500 mg/M2 and Keytruda 200 mg IV every 3 weeks.  Status post 1 cycle which she tolerated fairly well with no concerning complaints. Recommend that he proceed with cycle 2 as scheduled today. He will continue to have weekly labs. He will come back for follow-up visit in 3 weeks for evaluation before starting cycle #3.  The patient was advised to call immediately if he has any concerning symptoms in the interval. The patient voices understanding of current disease status and treatment options and is in agreement with the current care plan.  All questions were answered. The patient knows to call the clinic with any problems, questions or concerns. We can certainly see the patient much sooner if necessary.

## 2017-10-25 NOTE — Progress Notes (Signed)
Per Dr. [name], okay to treat pt with ALT of 82

## 2017-10-25 NOTE — Patient Instructions (Signed)
[facility] Discharge Instructions for Patients Receiving Chemotherapy  Today you received the following chemotherapy agents Keytruda, alimta, and carboplatin  To help prevent nausea and vomiting after your treatment, we encourage you to take your nausea medication as directed   If you develop nausea and vomiting that is not controlled by your nausea medication, call the clinic.   BELOW ARE SYMPTOMS THAT SHOULD BE REPORTED IMMEDIATELY:  *FEVER GREATER THAN 100.5 F  *CHILLS WITH OR WITHOUT FEVER  NAUSEA AND VOMITING THAT IS NOT CONTROLLED WITH YOUR NAUSEA MEDICATION  *UNUSUAL SHORTNESS OF BREATH  *UNUSUAL BRUISING OR BLEEDING  TENDERNESS IN MOUTH AND THROAT WITH OR WITHOUT PRESENCE OF ULCERS  *URINARY PROBLEMS  *BOWEL PROBLEMS  UNUSUAL RASH Items with * indicate a potential emergency and should be followed up as soon as possible.  Feel free to call the clinic should you have any questions or concerns. The clinic phone number is [phone].  Please show the [facility] at check-in to the Emergency Department and triage nurse.

## 2017-10-30 ENCOUNTER — Telehealth: Payer: Self-pay | Admitting: Interventional Cardiology

## 2017-10-30 ENCOUNTER — Telehealth: Payer: Self-pay

## 2017-10-30 ENCOUNTER — Other Ambulatory Visit: Payer: Self-pay | Admitting: *Deleted

## 2017-10-30 MED ORDER — METOPROLOL TARTRATE 25 MG PO TABS: 12.5000 mg | ORAL_TABLET | Freq: Two times a day (BID) | ORAL | 0 refills | Status: DC

## 2017-10-30 MED ORDER — ATORVASTATIN CALCIUM 80 MG PO TABS: 80.0000 mg | ORAL_TABLET | Freq: Every day | ORAL | 0 refills | Status: DC

## 2017-10-30 MED ORDER — CLOPIDOGREL BISULFATE 75 MG PO TABS: 75.0000 mg | ORAL_TABLET | Freq: Every day | ORAL | 0 refills | Status: DC

## 2017-10-30 NOTE — Telephone Encounter (Signed)
New message:      *STAT* If patient is at the pharmacy, call can be transferred to refill team.   1. Which medications need to be refilled? (please list name of each medication and dose if known)  atorvastatin (LIPITOR) 80 MG tablet  clopidogrel (PLAVIX) 75 MG tablet  metoprolol tartrate (LOPRESSOR) 25 MG tablet  prochlorperazine (COMPAZINE) 10 MG tablet  chlorproMAZINE (THORAZINE) 25 MG tablet   2. Which pharmacy/location (including street and city if local pharmacy) is medication to be sent to?[id number] - ,  - [address]  3. Do they need a 30 day or 90 day supply? [place]

## 2017-10-30 NOTE — Telephone Encounter (Signed)
Anthony [name] called and stated that he was out of all his medications.  He stated that he was unsure if he was to continue to take them.  I advised him to not stop any medications unless told to do so by a physician, and that he should contact his Cardiologist office to get refills.  He acknowledged receipt.

## 2017-11-01 ENCOUNTER — Inpatient Hospital Stay: Payer: BLUE CROSS/BLUE SHIELD | Attending: Internal Medicine

## 2017-11-01 DIAGNOSIS — C7951 Secondary malignant neoplasm of bone: Secondary | ICD-10-CM | POA: Insufficient documentation

## 2017-11-01 DIAGNOSIS — C787 Secondary malignant neoplasm of liver and intrahepatic bile duct: Secondary | ICD-10-CM | POA: Insufficient documentation

## 2017-11-01 DIAGNOSIS — K219 Gastro-esophageal reflux disease without esophagitis: Secondary | ICD-10-CM | POA: Insufficient documentation

## 2017-11-01 DIAGNOSIS — Z923 Personal history of irradiation: Secondary | ICD-10-CM | POA: Diagnosis not present

## 2017-11-01 DIAGNOSIS — Z7982 Long term (current) use of aspirin: Secondary | ICD-10-CM | POA: Insufficient documentation

## 2017-11-01 DIAGNOSIS — I252 Old myocardial infarction: Secondary | ICD-10-CM | POA: Insufficient documentation

## 2017-11-01 DIAGNOSIS — C3491 Malignant neoplasm of unspecified part of right bronchus or lung: Secondary | ICD-10-CM

## 2017-11-01 DIAGNOSIS — C3411 Malignant neoplasm of upper lobe, right bronchus or lung: Secondary | ICD-10-CM | POA: Diagnosis not present

## 2017-11-01 DIAGNOSIS — Z79899 Other long term (current) drug therapy: Secondary | ICD-10-CM | POA: Diagnosis not present

## 2017-11-01 DIAGNOSIS — Z5111 Encounter for antineoplastic chemotherapy: Secondary | ICD-10-CM | POA: Insufficient documentation

## 2017-11-01 LAB — CBC WITH DIFFERENTIAL (CANCER CENTER ONLY)
Basophils Absolute: 0 10*3/uL (ref 0.0–0.1)
Basophils Relative: 1 %
EOS ABS: 0.1 10*3/uL (ref 0.0–0.5)
Eosinophils Relative: 2 %
HEMATOCRIT: 38.6 % (ref 38.4–49.9)
HEMOGLOBIN: 13.5 g/dL (ref 13.0–17.1)
LYMPHS ABS: 0.8 10*3/uL — AB (ref 0.9–3.3)
LYMPHS PCT: 35 %
MCH: 30.5 pg (ref 27.2–33.4)
MCHC: 35 g/dL (ref 32.0–36.0)
MCV: 87.3 fL (ref 79.3–98.0)
Monocytes Absolute: 0.3 10*3/uL (ref 0.1–0.9)
Monocytes Relative: 11 %
NEUTROS ABS: 1.2 10*3/uL — AB (ref 1.5–6.5)
NEUTROS PCT: 51 %
Platelet Count: 224 10*3/uL (ref 140–400)
RBC: 4.42 MIL/uL (ref 4.20–5.82)
RDW: 13.5 % (ref 11.0–14.6)
WBC: 2.3 10*3/uL — AB (ref 4.0–10.3)

## 2017-11-01 LAB — CMP (CANCER CENTER ONLY)
ALT: 83 U/L — ABNORMAL HIGH (ref 0–55)
ANION GAP: 10 (ref 3–11)
AST: 32 U/L (ref 5–34)
Albumin: 3.4 g/dL — ABNORMAL LOW (ref 3.5–5.0)
Alkaline Phosphatase: 53 U/L (ref 40–150)
BUN: 19 mg/dL (ref 7–26)
CHLORIDE: 100 mmol/L (ref 98–109)
CO2: 28 mmol/L (ref 22–29)
CREATININE: 0.92 mg/dL (ref 0.70–1.30)
Calcium: 9.3 mg/dL (ref 8.4–10.4)
Glucose, Bld: 81 mg/dL (ref 70–140)
POTASSIUM: 4.3 mmol/L (ref 3.5–5.1)
Sodium: 138 mmol/L (ref 136–145)
Total Bilirubin: 0.7 mg/dL (ref 0.2–1.2)
Total Protein: 6.6 g/dL (ref 6.4–8.3)

## 2017-11-06 ENCOUNTER — Other Ambulatory Visit: Payer: Self-pay

## 2017-11-06 ENCOUNTER — Ambulatory Visit
Admission: RE | Admit: 2017-11-06 | Discharge: 2017-11-06 | Disposition: A | Discharge status: Home / Self Care | Payer: BLUE CROSS/BLUE SHIELD | Source: Ambulatory Visit | Attending: Radiation Oncology | Admitting: Radiation Oncology

## 2017-11-06 ENCOUNTER — Encounter: Payer: Self-pay | Admitting: Radiation Oncology

## 2017-11-06 VITALS — BP 140/86 | HR 66 | Temp 98.1°F | Resp 18 | Wt 210.0 lb

## 2017-11-06 DIAGNOSIS — C3490 Malignant neoplasm of unspecified part of unspecified bronchus or lung: Secondary | ICD-10-CM | POA: Insufficient documentation

## 2017-11-06 DIAGNOSIS — Z79899 Other long term (current) drug therapy: Secondary | ICD-10-CM | POA: Insufficient documentation

## 2017-11-06 DIAGNOSIS — Z923 Personal history of irradiation: Secondary | ICD-10-CM | POA: Insufficient documentation

## 2017-11-06 DIAGNOSIS — Z7902 Long term (current) use of antithrombotics/antiplatelets: Secondary | ICD-10-CM | POA: Insufficient documentation

## 2017-11-06 DIAGNOSIS — Z7982 Long term (current) use of aspirin: Secondary | ICD-10-CM | POA: Diagnosis not present

## 2017-11-06 DIAGNOSIS — C3491 Malignant neoplasm of unspecified part of right bronchus or lung: Secondary | ICD-10-CM

## 2017-11-06 DIAGNOSIS — C7951 Secondary malignant neoplasm of bone: Secondary | ICD-10-CM | POA: Diagnosis present

## 2017-11-06 NOTE — Progress Notes (Signed)
Radiation Oncology         [phone] ________________________________  Name: Anthony [name] MRN: [id number]  Date: 11/06/2017  DOB: 09/28/1970  Follow-Up Visit Note  CC: [name], MD  Grace Isaac, MD    ICD-10-CM   1. 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 The patient reports discomfort to the hip area. He reports mild fatigue. He also complains of teeth sensitivity since the start of chemotherapy and he is concerned of a tooth infection. He reports using lidocaine prior to eating to reduce pain. He denies skin issues. He is taking Decadron. He continues on chemotherapy on the third Wednesday of the month. He denies swelling, numbness, or tingling to the extremities.  No more sore throat.                        ALLERGIES:  has No Known Allergies.  Meds: Current Outpatient Medications  Medication Sig Dispense Refill  . aspirin EC 81 MG EC tablet Take 1 tablet (81 mg total) by mouth daily.    Marland Kitchen atorvastatin (LIPITOR) 80 MG tablet Take 1 tablet (80 mg total) by mouth daily at 6 PM. 35 tablet 0  . cetirizine (ZYRTEC) 10 MG tablet Take 10 mg by mouth daily.    . clopidogrel (PLAVIX) 75 MG tablet Take 1 tablet (75 mg total) by mouth daily. 35 tablet 0  . dexamethasone (DECADRON) 4 MG tablet Take 1 tablet twice a day the day before, day of, and day after each cycle of chemotherapy. 20 tablet 1  . esomeprazole (NEXIUM) 20 MG capsule Take 20 mg by mouth every other day.     . folic acid (FOLVITE) 1 MG tablet Take 1 tablet (1 mg total) by mouth daily. 30 tablet 2  . lisinopril (PRINIVIL,ZESTRIL) 5 MG tablet Take 1 tablet (5 mg total) by mouth daily. 30 tablet 3  .  metoprolol tartrate (LOPRESSOR) 25 MG tablet Take 0.5 tablets (12.5 mg total) by mouth 2 (two) times daily. 35 tablet 0  . oxyCODONE-acetaminophen (PERCOCET/ROXICET) 5-325 MG tablet Take 1 tablet by mouth every 4 (four) hours as needed for severe pain. 30 tablet 0  . prochlorperazine (COMPAZINE) 10 MG tablet Take 1 tablet (10 mg total) by mouth every 6 (six) hours as needed for nausea or vomiting. 30 tablet 1  . chlorproMAZINE (THORAZINE) 25 MG tablet Take 1 tablet (25 mg total) by mouth 3 (three) times daily as needed. (Patient not taking: Reported on 11/06/2017) 30 tablet 0  . loratadine (CLARITIN) 10 MG tablet Take 10 mg daily as needed by mouth for allergies.    . Wound Dressings (SONAFINE EX) Apply topically.     No current facility-administered medications for this encounter.     Physical Findings: The patient is in no acute distress. Patient is alert and oriented.  weight is 210 lb (95.3 kg). His oral temperature is 98.1 F (36.7 C). His blood pressure is 140/86 and his pulse is 66. His respiration is 18 and oxygen saturation is 100%. .  No significant changes. Lungs are clear to auscultation bilaterally. Heart has regular rate and rhythm. No palpable cervical, supraclavicular,  or axillary adenopathy. Abdomen soft, non-tender, normal bowel sounds.  Swelling along the gum line of his right mandible along the last remaining molar in that area.   Lab Findings: Lab Results  Component Value Date   WBC 2.3 (L) 11/01/2017   HGB 15.2 09/06/2017   HCT 38.6 11/01/2017   MCV 87.3 11/01/2017   PLT 224 11/01/2017    Radiographic Findings: No results found.  Impression:  The patient is recovering from the effects of radiation.   Plan: Follow-up with radiation oncology prn. Patient will remain under close follow-up with medical oncology. Advised patient follow-up with his dentist and to refer to Dr. [name] for clearance of any dental procedure.  ____________________________________  This  document serves as a record of services personally performed by Gery Pray, MD. It was created on his behalf by Bethann Humble, a trained medical scribe. The creation of this record is based on the scribe's personal observations and the provider's statements to them. This document has been checked and approved by the attending provider.

## 2017-11-06 NOTE — Progress Notes (Signed)
Mr. Anthony [name] is here for his follow-up appointment. States that he has some discomfort in his hip area. Denies any skin issues. States that he is taking Decadron.States that he has mild fatigue.States that he has chemotherapy on the third wednesday of the month.Denies any swelling,numbness or tingling to his extremities. Vitals:   11/06/17 1602  BP: 140/86  Pulse: 66  Resp: 18  Temp: 98.1 F (36.7 C)  TempSrc: Oral  SpO2: 100%  Weight: 210 lb (95.3 kg)   Wt Readings from Last 3 Encounters:  11/06/17 210 lb (95.3 kg)  10/25/17 206 lb 6.4 oz (93.6 kg)  10/16/17 200 lb 9.6 oz (91 kg)

## 2017-11-07 ENCOUNTER — Telehealth: Payer: Self-pay

## 2017-11-07 ENCOUNTER — Encounter: Payer: Self-pay | Admitting: *Deleted

## 2017-11-07 NOTE — Progress Notes (Signed)
[facility] Clinical Social Worker  Clinical Social Worker received phone call from patient requesting information for his children.  CSW met with  patient in [place] office at [facility] to offer support and review resources.  Patient expressed concern for how his 47 year old son and 37 year old daughter where adjusting to his diagnosis.  CSW and patient reviewed resources on "talking with your children when a parent has cancer", "talking with your teenager", and "A guide for teens: When your parents have cancer".  CSW and patient also processed patients concerns and identified positive ways to improve communication with his son.  CSW provided patient with information on [facility] and counseling options, and information on [place].  CSW also provided additional support for patient and encouraged him to contact CSW with any additional questions or concerns.  Patient expressed interest in additional support and will contact CSW with needs.  Johnnye Lana, MSW, LCSW, OSW-C Clinical Social Worker [facility] [phone]

## 2017-11-07 NOTE — Telephone Encounter (Signed)
   [facility] Pre-operative Risk Assessment    Request for surgical clearance:  1. What type of surgery is being performed? Extraction   2. When is this surgery scheduled? TBD   3. What type of clearance is required (medical clearance vs. Pharmacy clearance to hold med vs. Both)? both  4. Are there any medications that need to be held prior to surgery and how long?unknown   5. Practice name and name of physician performing surgery? [facility] Dr Waunita Schooner   6. What is your office phone and fax number? [phone]  [phone]   7. Anesthesia type (None, local, MAC, general) ? local   Frederik Schmidt 11/07/2017, 3:12 PM  _________________________________________________________________   (provider comments below)

## 2017-11-08 ENCOUNTER — Inpatient Hospital Stay: Payer: BLUE CROSS/BLUE SHIELD

## 2017-11-08 DIAGNOSIS — C3491 Malignant neoplasm of unspecified part of right bronchus or lung: Secondary | ICD-10-CM

## 2017-11-08 DIAGNOSIS — C3411 Malignant neoplasm of upper lobe, right bronchus or lung: Secondary | ICD-10-CM | POA: Diagnosis not present

## 2017-11-08 LAB — CMP (CANCER CENTER ONLY)
ALBUMIN: 3.6 g/dL (ref 3.5–5.0)
ALK PHOS: 48 U/L (ref 40–150)
ALT: 155 U/L — ABNORMAL HIGH (ref 0–55)
ANION GAP: 9 (ref 3–11)
AST: 54 U/L — ABNORMAL HIGH (ref 5–34)
BUN: 16 mg/dL (ref 7–26)
CO2: 28 mmol/L (ref 22–29)
Calcium: 10 mg/dL (ref 8.4–10.4)
Chloride: 105 mmol/L (ref 98–109)
Creatinine: 0.85 mg/dL (ref 0.70–1.30)
GFR, Est AFR Am: >60 mL/min (ref >60)
GFR, Estimated: >60 mL/min (ref >60)
GLUCOSE: 80 mg/dL (ref 70–140)
POTASSIUM: 4.3 mmol/L (ref 3.5–5.1)
SODIUM: 142 mmol/L (ref 136–145)
Total Bilirubin: 0.5 mg/dL (ref 0.2–1.2)
Total Protein: 7.1 g/dL (ref 6.4–8.3)

## 2017-11-08 LAB — CBC WITH DIFFERENTIAL (CANCER CENTER ONLY)
BASOS ABS: 0 10*3/uL (ref 0.0–0.1)
BASOS PCT: 1 %
EOS ABS: 0 10*3/uL (ref 0.0–0.5)
Eosinophils Relative: 1 %
HCT: 37.7 % — ABNORMAL LOW (ref 38.4–49.9)
HEMOGLOBIN: 13.1 g/dL (ref 13.0–17.1)
Lymphocytes Relative: 27 %
Lymphs Abs: 0.8 10*3/uL — ABNORMAL LOW (ref 0.9–3.3)
MCH: 30.6 pg (ref 27.2–33.4)
MCHC: 34.7 g/dL (ref 32.0–36.0)
MCV: 88.2 fL (ref 79.3–98.0)
MONOS PCT: 11 %
Monocytes Absolute: 0.3 10*3/uL (ref 0.1–0.9)
NEUTROS PCT: 60 %
Neutro Abs: 1.7 10*3/uL (ref 1.5–6.5)
Platelet Count: 152 10*3/uL (ref 140–400)
RBC: 4.27 MIL/uL (ref 4.20–5.82)
RDW: 14.2 % (ref 11.0–14.6)
WBC: 2.9 10*3/uL — AB (ref 4.0–10.3)

## 2017-11-10 ENCOUNTER — Telehealth: Payer: Self-pay | Admitting: *Deleted

## 2017-11-10 NOTE — Telephone Encounter (Signed)
Faxed ROI to [facility], attn: Janyth Contes DDS; release [phone]

## 2017-11-10 NOTE — Telephone Encounter (Signed)
   Primary Cardiologist: [name]   Chart reviewed as part of pre-operative protocol coverage. Given past medical history and time since last visit, based on ACC/AHA guidelines, Anthony [name] would be at acceptable risk for the planned procedure without further cardiovascular testing. In regards to medication, pt reports that his dentist informed him that he would not need to hold Plavix and ok to continue during the perioperative period.   I will route this recommendation to the requesting party via Epic fax function and remove from pre-op pool.  Please call with questions.  Lyda Jester, PA-C 11/10/2017, 3:38 PM

## 2017-11-10 NOTE — Telephone Encounter (Signed)
Clearance for tooth extraction also received from Dr. Berniece Pap and Joneen Caraway.  I spoke with pt who reports Dr. Blanch Media is his dentist and Dr. Berniece Pap and Joneen Caraway will do extraction. Fax number for Dr.DeSalvo and Joneen Caraway is  [phone].  Phone number is [phone].  Office requests last office note also.

## 2017-11-15 ENCOUNTER — Inpatient Hospital Stay (HOSPITAL_BASED_OUTPATIENT_CLINIC_OR_DEPARTMENT_OTHER): Payer: BLUE CROSS/BLUE SHIELD | Admitting: Internal Medicine

## 2017-11-15 ENCOUNTER — Encounter: Payer: Self-pay | Admitting: Internal Medicine

## 2017-11-15 ENCOUNTER — Telehealth: Payer: Self-pay | Admitting: Internal Medicine

## 2017-11-15 ENCOUNTER — Inpatient Hospital Stay: Payer: BLUE CROSS/BLUE SHIELD

## 2017-11-15 ENCOUNTER — Encounter: Payer: Self-pay | Admitting: *Deleted

## 2017-11-15 VITALS — BP 139/91 | HR 77 | Temp 98.5°F | Resp 17 | Ht 70.0 in | Wt 205.0 lb

## 2017-11-15 DIAGNOSIS — I252 Old myocardial infarction: Secondary | ICD-10-CM | POA: Diagnosis not present

## 2017-11-15 DIAGNOSIS — Z7982 Long term (current) use of aspirin: Secondary | ICD-10-CM | POA: Diagnosis not present

## 2017-11-15 DIAGNOSIS — C7951 Secondary malignant neoplasm of bone: Secondary | ICD-10-CM

## 2017-11-15 DIAGNOSIS — Z5112 Encounter for antineoplastic immunotherapy: Secondary | ICD-10-CM

## 2017-11-15 DIAGNOSIS — C3491 Malignant neoplasm of unspecified part of right bronchus or lung: Secondary | ICD-10-CM

## 2017-11-15 DIAGNOSIS — Z923 Personal history of irradiation: Secondary | ICD-10-CM | POA: Diagnosis not present

## 2017-11-15 DIAGNOSIS — Z79899 Other long term (current) drug therapy: Secondary | ICD-10-CM

## 2017-11-15 DIAGNOSIS — Z5111 Encounter for antineoplastic chemotherapy: Secondary | ICD-10-CM

## 2017-11-15 DIAGNOSIS — C787 Secondary malignant neoplasm of liver and intrahepatic bile duct: Secondary | ICD-10-CM | POA: Diagnosis not present

## 2017-11-15 DIAGNOSIS — Z515 Encounter for palliative care: Secondary | ICD-10-CM | POA: Insufficient documentation

## 2017-11-15 DIAGNOSIS — C3411 Malignant neoplasm of upper lobe, right bronchus or lung: Secondary | ICD-10-CM

## 2017-11-15 DIAGNOSIS — C3490 Malignant neoplasm of unspecified part of unspecified bronchus or lung: Secondary | ICD-10-CM

## 2017-11-15 DIAGNOSIS — K219 Gastro-esophageal reflux disease without esophagitis: Secondary | ICD-10-CM

## 2017-11-15 DIAGNOSIS — Z7189 Other specified counseling: Secondary | ICD-10-CM

## 2017-11-15 LAB — CMP (CANCER CENTER ONLY)
ALBUMIN: 4.3 g/dL (ref 3.5–5.0)
ALT: 89 U/L — AB (ref 0–55)
ANION GAP: 10 (ref 3–11)
AST: 26 U/L (ref 5–34)
Alkaline Phosphatase: 61 U/L (ref 40–150)
BUN: 14 mg/dL (ref 7–26)
CHLORIDE: 105 mmol/L (ref 98–109)
CO2: 25 mmol/L (ref 22–29)
Calcium: 10.6 mg/dL — ABNORMAL HIGH (ref 8.4–10.4)
Creatinine: 0.86 mg/dL (ref 0.70–1.30)
GFR, Est AFR Am: >60 mL/min (ref >60)
GFR, Estimated: >60 mL/min (ref >60)
GLUCOSE: 111 mg/dL (ref 70–140)
Potassium: 4 mmol/L (ref 3.5–5.1)
SODIUM: 140 mmol/L (ref 136–145)
Total Bilirubin: 0.6 mg/dL (ref 0.2–1.2)
Total Protein: 7.9 g/dL (ref 6.4–8.3)

## 2017-11-15 LAB — CBC WITH DIFFERENTIAL (CANCER CENTER ONLY)
Basophils Absolute: 0 10*3/uL (ref 0.0–0.1)
Basophils Relative: 0 %
Eosinophils Absolute: 0 10*3/uL (ref 0.0–0.5)
Eosinophils Relative: 0 %
HEMATOCRIT: 39.2 % (ref 38.4–49.9)
HEMOGLOBIN: 14 g/dL (ref 13.0–17.1)
LYMPHS ABS: 0.9 10*3/uL (ref 0.9–3.3)
Lymphocytes Relative: 13 %
MCH: 31.3 pg (ref 27.2–33.4)
MCHC: 35.7 g/dL (ref 32.0–36.0)
MCV: 87.7 fL (ref 79.3–98.0)
MONO ABS: 0.4 10*3/uL (ref 0.1–0.9)
MONOS PCT: 6 %
NEUTROS ABS: 5.2 10*3/uL (ref 1.5–6.5)
NEUTROS PCT: 81 %
Platelet Count: 387 10*3/uL (ref 140–400)
RBC: 4.47 MIL/uL (ref 4.20–5.82)
RDW: 14.7 % — AB (ref 11.0–14.6)
WBC Count: 6.4 10*3/uL (ref 4.0–10.3)

## 2017-11-15 LAB — TSH: TSH: 0.258 u[IU]/mL — ABNORMAL LOW (ref 0.320–4.118)

## 2017-11-15 MED ORDER — PALONOSETRON HCL INJECTION 0.25 MG/5ML
INTRAVENOUS | Status: AC
  Filled 2017-11-15: qty 5

## 2017-11-15 MED ORDER — HEPARIN SOD (PORK) LOCK FLUSH 100 UNIT/ML IV SOLN
500.0000 [IU] | Freq: Once | INTRAVENOUS | Status: DC | PRN
  Filled 2017-11-15: qty 5

## 2017-11-15 MED ORDER — PALONOSETRON HCL INJECTION 0.25 MG/5ML
0.2500 mg | Freq: Once | INTRAVENOUS | Status: AC
  Administered 2017-11-15: 0.25 mg via INTRAVENOUS

## 2017-11-15 MED ORDER — SODIUM CHLORIDE 0.9 % IV SOLN
Freq: Once | INTRAVENOUS | Status: AC
  Administered 2017-11-15: 10:00:00 via INTRAVENOUS

## 2017-11-15 MED ORDER — DEXAMETHASONE 4 MG PO TABS: ORAL_TABLET | ORAL | 1 refills | Status: DC

## 2017-11-15 MED ORDER — SODIUM CHLORIDE 0.9 % IV SOLN
510.0000 mg/m2 | Freq: Once | INTRAVENOUS | Status: AC
  Administered 2017-11-15: 1100 mg via INTRAVENOUS
  Filled 2017-11-15: qty 40

## 2017-11-15 MED ORDER — SODIUM CHLORIDE 0.9 % IV SOLN
Freq: Once | INTRAVENOUS | Status: AC
  Administered 2017-11-15: 10:00:00 via INTRAVENOUS
  Filled 2017-11-15: qty 5

## 2017-11-15 MED ORDER — CYANOCOBALAMIN 1000 MCG/ML IJ SOLN
INTRAMUSCULAR | Status: AC
  Filled 2017-11-15: qty 1

## 2017-11-15 MED ORDER — SODIUM CHLORIDE 0.9 % IV SOLN
200.0000 mg | Freq: Once | INTRAVENOUS | Status: AC
  Administered 2017-11-15: 200 mg via INTRAVENOUS
  Filled 2017-11-15: qty 8

## 2017-11-15 MED ORDER — CYANOCOBALAMIN 1000 MCG/ML IJ SOLN
1000.0000 ug | Freq: Once | INTRAMUSCULAR | Status: AC
  Administered 2017-11-15: 1000 ug via INTRAMUSCULAR

## 2017-11-15 MED ORDER — CARBOPLATIN CHEMO INJECTION 600 MG/60ML
750.0000 mg | Freq: Once | INTRAVENOUS | Status: AC
  Administered 2017-11-15: 750 mg via INTRAVENOUS
  Filled 2017-11-15: qty 75

## 2017-11-15 MED ORDER — SODIUM CHLORIDE 0.9% FLUSH
10.0000 mL | INTRAVENOUS | Status: DC | PRN
  Filled 2017-11-15: qty 10

## 2017-11-15 MED FILL — Sodium Chloride IV Soln 0.9%: INTRAVENOUS | Qty: 1000 | Status: AC

## 2017-11-15 NOTE — Progress Notes (Signed)
[place] [phone]   [phone]  OFFICE PROGRESS NOTE  [name], MD [address] [place] [address]  DIAGNOSIS: Stage IVB(T1b, N2, M1c)non-small cell lung cancer, adenocarcinoma presented with right upper lobe lung nodule in addition to right hilar and mediastinal lymphadenopathy as well as metastatic liver and bone disease diagnosed in February 2019.  Biomarker Findings Microsatellite status - MS-Stable Tumor Mutational Burden - TMB-Low (4 Muts/Mb) Genomic Findings For a complete list of the genes assayed, please refer to the Appendix. EGFR exon 20 insertion ([id number]) RB1 loss exons 9-17 [id number] 7 Disease relevant genes with no reportable alterations: KRAS, ALK, BRAF, MET, RET, ERBB2, ROS1  PDL 1 expression 5%   PRIOR THERAPY: Palliative radiotherapy to the metastatic bone lesions in the cervical spine and left femur under the care of Dr. Sondra Come.  CURRENT THERAPY: Systemic chemotherapy with carboplatin for AUC of 5, Alimta 500 mg/M2 and Keytruda 200 mg IV every 3 weeks.  First dose October 04, 2017.  Status post 2 cycles.  INTERVAL HISTORY: Anthony [name] 47 y.o. male returns to the clinic today for follow-up visit.  The patient is feeling fine today with no specific complaints.  He had a tooth extraction yesterday and feeling better.  He denied having any current chest pain, shortness breath, cough or hemoptysis.  He denied having any fever or chills.  He has no nausea, vomiting, diarrhea or constipation.  He noticed some numbness in the right big toe recently.  The patient also has some aching pain in the left hip area that was previously treated with radiation.  He is here today for evaluation before starting cycle #3 of his treatment.  MEDICAL HISTORY: Past Medical History:  Diagnosis Date  . Acid reflux   . History of radiation therapy 09/21/17-10/04/17   C4 spine 30 Gy in 10 fractions, left  femur 30 Gy in 10 fractions  . Non-small cell lung cancer ([place])   . NSTEMI (non-ST elevated myocardial infarction) ([place]) 06/13/2017   [name] 06/14/2017  . Tailbone injury since 1993   "cracked"    ALLERGIES:  has No Known Allergies.  MEDICATIONS:  Current Outpatient Medications  Medication Sig Dispense Refill  . aspirin EC 81 MG EC tablet Take 1 tablet (81 mg total) by mouth daily.    Marland Kitchen atorvastatin (LIPITOR) 80 MG tablet Take 1 tablet (80 mg total) by mouth daily at 6 PM. 35 tablet 0  . cetirizine (ZYRTEC) 10 MG tablet Take 10 mg by mouth daily.    . chlorproMAZINE (THORAZINE) 25 MG tablet Take 1 tablet (25 mg total) by mouth 3 (three) times daily as needed. (Patient not taking: Reported on 11/06/2017) 30 tablet 0  . clopidogrel (PLAVIX) 75 MG tablet Take 1 tablet (75 mg total) by mouth daily. 35 tablet 0  . dexamethasone (DECADRON) 4 MG tablet Take 1 tablet twice a day the day before, day of, and day after each cycle of chemotherapy. 20 tablet 1  . esomeprazole (NEXIUM) 20 MG capsule Take 20 mg by mouth every other day.     . folic acid (FOLVITE) 1 MG tablet Take 1 tablet (1 mg total) by mouth daily. 30 tablet 2  . lisinopril (PRINIVIL,ZESTRIL) 5 MG tablet Take 1 tablet (5 mg total) by mouth daily. 30 tablet 3  . loratadine (CLARITIN) 10 MG tablet Take 10 mg daily as needed by mouth for allergies.    . metoprolol tartrate (LOPRESSOR) 25 MG  tablet Take 0.5 tablets (12.5 mg total) by mouth 2 (two) times daily. 35 tablet 0  . oxyCODONE-acetaminophen (PERCOCET/ROXICET) 5-325 MG tablet Take 1 tablet by mouth every 4 (four) hours as needed for severe pain. 30 tablet 0  . prochlorperazine (COMPAZINE) 10 MG tablet Take 1 tablet (10 mg total) by mouth every 6 (six) hours as needed for nausea or vomiting. 30 tablet 1  . Wound Dressings (SONAFINE EX) Apply topically.     No current facility-administered medications for this visit.     SURGICAL HISTORY:  Past Surgical History:  Procedure  Laterality Date  . CARDIAC CATHETERIZATION  06/14/2017  . CORONARY ARTERY BYPASS GRAFT N/A 06/19/2017   Procedure: CORONARY ARTERY BYPASS GRAFTING (CABG), OFF PUMP, times one using the left internal mammary artery to LAD;  Surgeon: Grace Isaac, MD;  Location: [facility];  Service: Open Heart Surgery;  Laterality: N/A;  . LEFT HEART CATH AND CORONARY ANGIOGRAPHY N/A 06/14/2017   Procedure: LEFT HEART CATH AND CORONARY ANGIOGRAPHY;  Surgeon: Sherren Mocha, MD;  Location: [facility];  Service: Cardiovascular;  Laterality: N/A;  . TEE WITHOUT CARDIOVERSION N/A 06/19/2017   Procedure: TRANSESOPHAGEAL ECHOCARDIOGRAM (TEE);  Surgeon: Grace Isaac, MD;  Location: [facility];  Service: Open Heart Surgery;  Laterality: N/A;  . TONSILLECTOMY  ~ 1977    REVIEW OF SYSTEMS:  A comprehensive review of systems was negative except for: Musculoskeletal: positive for bone pain Neurological: positive for paresthesia   PHYSICAL EXAMINATION: General appearance: alert, cooperative and no distress Head: Normocephalic, without obvious abnormality, atraumatic Neck: no adenopathy, no JVD, supple, symmetrical, trachea midline and thyroid not enlarged, symmetric, no tenderness/mass/nodules Lymph nodes: Cervical, supraclavicular, and axillary nodes normal. Resp: clear to auscultation bilaterally Back: symmetric, no curvature. ROM normal. No CVA tenderness. Cardio: regular rate and rhythm, S1, S2 normal, no murmur, click, rub or gallop GI: soft, non-tender; bowel sounds normal; no masses,  no organomegaly Extremities: extremities normal, atraumatic, no cyanosis or edema  ECOG PERFORMANCE STATUS: 1 - Symptomatic but completely ambulatory  Blood pressure (!) 139/91, pulse 77, temperature 98.5 F (36.9 C), temperature source Oral, resp. rate 17, height '5\' 10"'$  (1.778 m), weight 205 lb (93 kg), SpO2 98 %.  LABORATORY DATA: Lab Results  Component Value Date   WBC 6.4 11/15/2017   HGB 15.2 09/06/2017   HCT  39.2 11/15/2017   MCV 87.7 11/15/2017   PLT 387 11/15/2017      Chemistry      Component Value Date/Time   NA 142 11/08/2017 0748   NA 141 07/17/2017 1006   K 4.3 11/08/2017 0748   CL 105 11/08/2017 0748   CO2 28 11/08/2017 0748   BUN 16 11/08/2017 0748   BUN 12 07/17/2017 1006   CREATININE 0.85 11/08/2017 0748      Component Value Date/Time   CALCIUM 10.0 11/08/2017 0748   ALKPHOS 48 11/08/2017 0748   AST 54 (H) 11/08/2017 0748   ALT 155 (H) 11/08/2017 0748   BILITOT 0.5 11/08/2017 0748       RADIOGRAPHIC STUDIES: No results found.  ASSESSMENT AND PLAN: This is a very pleasant 47 years old white male with stage IV non-small cell lung cancer, adenocarcinoma with resistant EGFR mutation in exon 20 and PDL 1 expression of 5%. The patient completed a course of palliative radiotherapy to the cervical spine as well as left hip under the care of Dr. Sondra Come.Marland Kitchen He is currently undergoing systemic chemotherapy with carboplatin, Alimta and Keytruda status post  2 cycles.  He has been tolerating his treatment fairly well with no concerning complaints. I recommended for him to proceed with cycle #3 today as a scheduled. I will see the patient back for follow-up visit in 3 weeks for evaluation after repeating CT scan of the chest, abdomen and pelvis for restaging of his disease. He was advised to call immediately if he has any concerning symptoms in the interval. The patient voices understanding of current disease status and treatment options and is in agreement with the current care plan.  All questions were answered. The patient knows to call the clinic with any problems, questions or concerns. We can certainly see the patient much sooner if necessary.  I spent 10 minutes counseling the patient face to face. The total time spent in the appointment was 15 minutes.  Disclaimer: This note was dictated with voice recognition software. Similar sounding words can inadvertently be transcribed  and may not be corrected upon review.

## 2017-11-15 NOTE — Progress Notes (Signed)
Oncology Nurse Navigator Documentation  Oncology Nurse Navigator Flowsheets 11/15/2017  Navigator Location [facility]  Navigator Encounter Type Clinic/MDC/spoke with patient today in clinic. He is doing well.  We discussed treatment side effects and when to call with issues.  We also discussed psychosocial concerns with his children and himself. His oldest kid has an appt to see CSW and he will contact them at a later point.  Mr. Thackeray treatment plan was discussed and he verbalized understanding. He will have scan before next appt.   Abnormal Finding Date 06/19/2017  Confirmed Diagnosis Date 09/06/2017  Multidisiplinary Clinic Date 09/07/2017  Treatment Initiated Date 09/20/2017  Patient Visit Type MedOnc  Treatment Phase Treatment  Barriers/Navigation Needs Education  Education Other  Interventions Education  Education Method Verbal  Acuity Level 1  Acuity Level 1 Minimal follow up required  Time Spent with Patient 30

## 2017-11-15 NOTE — Telephone Encounter (Signed)
Appointments scheduled pat picked up contrast for CT per 4/17 los

## 2017-11-15 NOTE — Patient Instructions (Signed)
[facility] Discharge Instructions for Patients Receiving Chemotherapy  Today you received the following chemotherapy agents Carboplatin, Alimta, and Keytruda.   To help prevent nausea and vomiting after your treatment, we encourage you to take your nausea medication as directed.   If you develop nausea and vomiting that is not controlled by your nausea medication, call the clinic.   BELOW ARE SYMPTOMS THAT SHOULD BE REPORTED IMMEDIATELY:  *FEVER GREATER THAN 100.5 F  *CHILLS WITH OR WITHOUT FEVER  NAUSEA AND VOMITING THAT IS NOT CONTROLLED WITH YOUR NAUSEA MEDICATION  *UNUSUAL SHORTNESS OF BREATH  *UNUSUAL BRUISING OR BLEEDING  TENDERNESS IN MOUTH AND THROAT WITH OR WITHOUT PRESENCE OF ULCERS  *URINARY PROBLEMS  *BOWEL PROBLEMS  UNUSUAL RASH Items with * indicate a potential emergency and should be followed up as soon as possible.  Feel free to call the clinic should you have any questions or concerns. The clinic phone number is [phone].  Please show the [facility] at check-in to the Emergency Department and triage nurse.

## 2017-11-18 ENCOUNTER — Other Ambulatory Visit: Payer: Self-pay | Admitting: Physician Assistant

## 2017-11-20 ENCOUNTER — Telehealth: Payer: Self-pay | Admitting: Medical Oncology

## 2017-11-20 NOTE — Telephone Encounter (Signed)
Pt reports R forearm IV site soreness and swelling , with redness since last Friday . He stated the redness has  diminished, but still sore with a bump today. Per [name] , I instructed pt to use warm moist compresses tid and to monitor for now. He was instructed to call back if redness reappears or symptoms get worse.

## 2017-11-22 ENCOUNTER — Inpatient Hospital Stay: Payer: BLUE CROSS/BLUE SHIELD

## 2017-11-22 DIAGNOSIS — C3491 Malignant neoplasm of unspecified part of right bronchus or lung: Secondary | ICD-10-CM

## 2017-11-22 DIAGNOSIS — C3411 Malignant neoplasm of upper lobe, right bronchus or lung: Secondary | ICD-10-CM | POA: Diagnosis not present

## 2017-11-22 LAB — CBC WITH DIFFERENTIAL (CANCER CENTER ONLY)
Basophils Absolute: 0 10*3/uL (ref 0.0–0.1)
Basophils Relative: 0 %
EOS ABS: 0 10*3/uL (ref 0.0–0.5)
Eosinophils Relative: 0 %
HEMATOCRIT: 35.3 % — AB (ref 38.4–49.9)
HEMOGLOBIN: 12.5 g/dL — AB (ref 13.0–17.1)
LYMPHS ABS: 1.4 10*3/uL (ref 0.9–3.3)
Lymphocytes Relative: 39 %
MCH: 30.8 pg (ref 27.2–33.4)
MCHC: 35.5 g/dL (ref 32.0–36.0)
MCV: 86.7 fL (ref 79.3–98.0)
MONO ABS: 0.5 10*3/uL (ref 0.1–0.9)
MONOS PCT: 13 %
NEUTROS ABS: 1.7 10*3/uL (ref 1.5–6.5)
NEUTROS PCT: 48 %
Platelet Count: 292 10*3/uL (ref 140–400)
RBC: 4.07 MIL/uL — ABNORMAL LOW (ref 4.20–5.82)
RDW: 14.7 % — ABNORMAL HIGH (ref 11.0–14.6)
WBC Count: 3.6 10*3/uL — ABNORMAL LOW (ref 4.0–10.3)

## 2017-11-22 LAB — CMP (CANCER CENTER ONLY)
ALK PHOS: 50 U/L (ref 40–150)
ALT: 67 U/L — ABNORMAL HIGH (ref 0–55)
ANION GAP: 12 — AB (ref 3–11)
AST: 24 U/L (ref 5–34)
Albumin: 3.9 g/dL (ref 3.5–5.0)
BILIRUBIN TOTAL: 0.6 mg/dL (ref 0.2–1.2)
BUN: 20 mg/dL (ref 7–26)
CALCIUM: 10 mg/dL (ref 8.4–10.4)
CO2: 24 mmol/L (ref 22–29)
Chloride: 104 mmol/L (ref 98–109)
Creatinine: 0.83 mg/dL (ref 0.70–1.30)
GFR, Estimated: >60 mL/min (ref >60)
Glucose, Bld: 112 mg/dL (ref 70–140)
Potassium: 3.7 mmol/L (ref 3.5–5.1)
Sodium: 140 mmol/L (ref 136–145)
TOTAL PROTEIN: 7 g/dL (ref 6.4–8.3)

## 2017-11-26 ENCOUNTER — Other Ambulatory Visit: Payer: Self-pay | Admitting: Interventional Cardiology

## 2017-11-28 ENCOUNTER — Emergency Department (HOSPITAL_COMMUNITY): Payer: BLUE CROSS/BLUE SHIELD

## 2017-11-28 ENCOUNTER — Observation Stay (HOSPITAL_COMMUNITY)
Admission: EM | Admit: 2017-11-28 | Discharge: 2017-12-01 | Disposition: A | Discharge status: Home / Self Care | Payer: BLUE CROSS/BLUE SHIELD | Attending: Internal Medicine | Admitting: Internal Medicine

## 2017-11-28 ENCOUNTER — Encounter (HOSPITAL_COMMUNITY): Payer: Self-pay | Admitting: Emergency Medicine

## 2017-11-28 ENCOUNTER — Other Ambulatory Visit: Payer: Self-pay

## 2017-11-28 ENCOUNTER — Telehealth: Payer: Self-pay | Admitting: *Deleted

## 2017-11-28 DIAGNOSIS — I2510 Atherosclerotic heart disease of native coronary artery without angina pectoris: Secondary | ICD-10-CM | POA: Diagnosis present

## 2017-11-28 DIAGNOSIS — Z85118 Personal history of other malignant neoplasm of bronchus and lung: Secondary | ICD-10-CM | POA: Insufficient documentation

## 2017-11-28 DIAGNOSIS — Q396 Congenital diverticulum of esophagus: Principal | ICD-10-CM | POA: Insufficient documentation

## 2017-11-28 DIAGNOSIS — Z7902 Long term (current) use of antithrombotics/antiplatelets: Secondary | ICD-10-CM | POA: Diagnosis not present

## 2017-11-28 DIAGNOSIS — K222 Esophageal obstruction: Secondary | ICD-10-CM | POA: Insufficient documentation

## 2017-11-28 DIAGNOSIS — Z951 Presence of aortocoronary bypass graft: Secondary | ICD-10-CM | POA: Insufficient documentation

## 2017-11-28 DIAGNOSIS — Z79899 Other long term (current) drug therapy: Secondary | ICD-10-CM | POA: Insufficient documentation

## 2017-11-28 DIAGNOSIS — I252 Old myocardial infarction: Secondary | ICD-10-CM | POA: Insufficient documentation

## 2017-11-28 DIAGNOSIS — K449 Diaphragmatic hernia without obstruction or gangrene: Secondary | ICD-10-CM | POA: Insufficient documentation

## 2017-11-28 DIAGNOSIS — R1013 Epigastric pain: Secondary | ICD-10-CM | POA: Diagnosis present

## 2017-11-28 DIAGNOSIS — R52 Pain, unspecified: Secondary | ICD-10-CM

## 2017-11-28 DIAGNOSIS — Z7982 Long term (current) use of aspirin: Secondary | ICD-10-CM | POA: Insufficient documentation

## 2017-11-28 DIAGNOSIS — C3491 Malignant neoplasm of unspecified part of right bronchus or lung: Secondary | ICD-10-CM | POA: Diagnosis present

## 2017-11-28 DIAGNOSIS — K225 Diverticulum of esophagus, acquired: Secondary | ICD-10-CM

## 2017-11-28 DIAGNOSIS — I25709 Atherosclerosis of coronary artery bypass graft(s), unspecified, with unspecified angina pectoris: Secondary | ICD-10-CM

## 2017-11-28 LAB — CBC
HCT: 35.4 % — ABNORMAL LOW (ref 39.0–52.0)
HEMOGLOBIN: 12.4 g/dL — AB (ref 13.0–17.0)
MCH: 31.1 pg (ref 26.0–34.0)
MCHC: 35 g/dL (ref 30.0–36.0)
MCV: 88.7 fL (ref 78.0–100.0)
PLATELETS: 134 10*3/uL — AB (ref 150–400)
RBC: 3.99 MIL/uL — AB (ref 4.22–5.81)
RDW: 15.2 % (ref 11.5–15.5)
WBC: 7.5 10*3/uL (ref 4.0–10.5)

## 2017-11-28 LAB — COMPREHENSIVE METABOLIC PANEL
ALBUMIN: 4.1 g/dL (ref 3.5–5.0)
ALT: 67 U/L — ABNORMAL HIGH (ref 17–63)
AST: 28 U/L (ref 15–41)
Alkaline Phosphatase: 43 U/L (ref 38–126)
Anion gap: 9 (ref 5–15)
BILIRUBIN TOTAL: 1 mg/dL (ref 0.3–1.2)
BUN: 18 mg/dL (ref 6–20)
CO2: 28 mmol/L (ref 22–32)
CREATININE: 1.03 mg/dL (ref 0.61–1.24)
Calcium: 9.4 mg/dL (ref 8.9–10.3)
Chloride: 102 mmol/L (ref 101–111)
GFR calc Af Amer: >60 mL/min (ref >60)
GFR calc non Af Amer: >60 mL/min (ref >60)
GLUCOSE: 102 mg/dL — AB (ref 65–99)
POTASSIUM: 3.8 mmol/L (ref 3.5–5.1)
Sodium: 139 mmol/L (ref 135–145)
TOTAL PROTEIN: 7.2 g/dL (ref 6.5–8.1)

## 2017-11-28 LAB — URINALYSIS, ROUTINE W REFLEX MICROSCOPIC
Bilirubin Urine: NEGATIVE
Glucose, UA: NEGATIVE mg/dL
Hgb urine dipstick: NEGATIVE
Ketones, ur: NEGATIVE mg/dL
LEUKOCYTES UA: NEGATIVE
Nitrite: NEGATIVE
PROTEIN: NEGATIVE mg/dL
pH: 8 (ref 5.0–8.0)

## 2017-11-28 LAB — LIPASE, BLOOD: Lipase: 32 U/L (ref 11–51)

## 2017-11-28 LAB — TROPONIN I

## 2017-11-28 MED ORDER — SODIUM CHLORIDE 0.9 % IV BOLUS
1000.0000 mL | Freq: Once | INTRAVENOUS | Status: AC
Start: 2017-11-28 — End: 2017-11-28
  Administered 2017-11-28: 1000 mL via INTRAVENOUS

## 2017-11-28 MED ORDER — SODIUM CHLORIDE 0.9 % IV BOLUS
1000.0000 mL | Freq: Once | INTRAVENOUS | Status: AC
  Administered 2017-11-28: 1000 mL via INTRAVENOUS

## 2017-11-28 MED ORDER — PANTOPRAZOLE SODIUM 40 MG IV SOLR
40.0000 mg | Freq: Once | INTRAVENOUS | Status: AC
Start: 2017-11-28 — End: 2017-11-28
  Administered 2017-11-28: 40 mg via INTRAVENOUS
  Filled 2017-11-28: qty 40

## 2017-11-28 MED ORDER — HYDROMORPHONE HCL 1 MG/ML IJ SOLN
1.0000 mg | Freq: Once | INTRAMUSCULAR | Status: AC
  Administered 2017-11-28: 1 mg via INTRAVENOUS
  Filled 2017-11-28: qty 1

## 2017-11-28 MED ORDER — SODIUM CHLORIDE 0.9 % IJ SOLN
INTRAMUSCULAR | Status: AC
  Filled 2017-11-28: qty 50

## 2017-11-28 MED ORDER — IOPAMIDOL (ISOVUE-300) INJECTION 61%
INTRAVENOUS | Status: AC
  Filled 2017-11-28: qty 100

## 2017-11-28 MED ORDER — IOPAMIDOL (ISOVUE-370) INJECTION 76%
100.0000 mL | Freq: Once | INTRAVENOUS | Status: AC | PRN
  Administered 2017-11-28: 75 mL via INTRAVENOUS

## 2017-11-28 MED ORDER — IOPAMIDOL (ISOVUE-300) INJECTION 61%
100.0000 mL | Freq: Once | INTRAVENOUS | Status: AC | PRN
  Administered 2017-11-28: 100 mL via INTRAVENOUS

## 2017-11-28 MED ORDER — IOPAMIDOL (ISOVUE-370) INJECTION 76%: 100.0000 mL | Freq: Once | INTRAVENOUS | Status: DC | PRN

## 2017-11-28 MED ORDER — HYDROMORPHONE HCL 2 MG/ML IJ SOLN
1.5000 mg | Freq: Once | INTRAMUSCULAR | Status: AC
  Administered 2017-11-28: 1.5 mg via INTRAVENOUS
  Filled 2017-11-28: qty 1

## 2017-11-28 MED ORDER — IOPAMIDOL (ISOVUE-370) INJECTION 76%
INTRAVENOUS | Status: AC
  Filled 2017-11-28: qty 100

## 2017-11-28 NOTE — ED Notes (Signed)
Patient just used bathroom and stated no one told him a urine sample was needed, will obtain sample next time patient goes

## 2017-11-28 NOTE — Telephone Encounter (Signed)
Voicemail forwarded to collaborative from [place] trying to get in touch with Dr. Worthy Flank nurse.  If she could call at her earliest convenience, [phone]."

## 2017-11-28 NOTE — ED Triage Notes (Signed)
Pt had mid severe abd pains that started all of sudden couple days ago. Reports feels like when you are hungry and havent eaten but not gotten any better. Denies v/d or urinary problems. Pt has stage 4 lung cancer and getting chemo treatments. Last treatment 4/17

## 2017-11-28 NOTE — ED Provider Notes (Signed)
[facility] DEPT Provider Note   CSN: [id number] Arrival date & time: 11/28/17  1828     History   Chief Complaint Chief Complaint  Patient presents with  . Abdominal Pain    HPI Anthony [name] is a 47 y.o. male.  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  Patient reports that it began suddenly this morning around 5 PM and  patient initially reported feeling into his back, but has felt a gnawing, severe, dull pain in the epigastrium to right upper quadrant all day without relief.  Patient reports it feels like he has a deep hunger.  Patient denies any nausea or vomiting with this pain.  Patient reports last bowel movement was today and normal without melena or hematochezia.  Patient denies fever or chills.  No abdominal surgical history.  Patient denies that this feels similar to prior MI, and he is having no chest pain or shortness breath this time.  No remedies tried at home for symptoms.  Patient reporting that the only other time he had similar symptoms was when he had an esophageal spasm after extubation from his CABG.  Past Medical History:  Diagnosis Date  . Acid reflux   . History of radiation therapy 09/21/17-10/04/17   C4 spine 30 Gy in 10 fractions, left femur 30 Gy in 10 fractions  . Non-small cell lung cancer ([place])   . NSTEMI (non-ST elevated myocardial infarction) ([place]) 06/13/2017   [name] 06/14/2017  . Tailbone injury since 1993   "cracked"    Patient Active Problem List   Diagnosis Date Noted  . Goals of care, counseling/discussion 11/15/2017  . Bone metastasis ([place]) 09/20/2017  . Encounter for antineoplastic chemotherapy 09/19/2017  . Encounter for antineoplastic immunotherapy 09/19/2017   . Stage 4 lung cancer, right ([place]) 09/07/2017  . S/P CABG x 1 06/21/2017  . Coronary artery disease 06/19/2017  . Hyperlipidemia with target LDL less than 70 06/15/2017  . Stage III chronic kidney disease ([place]) 06/15/2017  . NSTEMI (non-ST elevated myocardial infarction) ([place]) 06/14/2017    Past Surgical History:  Procedure Laterality Date  . CARDIAC CATHETERIZATION  06/14/2017  . CORONARY ARTERY BYPASS GRAFT N/A 06/19/2017   Procedure: CORONARY ARTERY BYPASS GRAFTING (CABG), OFF PUMP, times one using the left internal mammary artery to LAD;  Surgeon: Grace Isaac, MD;  Location: [facility];  Service: Open Heart Surgery;  Laterality: N/A;  . LEFT HEART CATH AND CORONARY ANGIOGRAPHY N/A 06/14/2017   Procedure: LEFT HEART CATH AND CORONARY ANGIOGRAPHY;  Surgeon: Sherren Mocha, MD;  Location: [facility];  Service: Cardiovascular;  Laterality: N/A;  . TEE WITHOUT CARDIOVERSION N/A 06/19/2017   Procedure: TRANSESOPHAGEAL ECHOCARDIOGRAM (TEE);  Surgeon: Grace Isaac, MD;  Location: [facility];  Service: Open Heart Surgery;  Laterality: N/A;  . TONSILLECTOMY  ~ 1977        Home Medications    Prior to Admission medications   Medication Sig Start Date End Date Taking? Authorizing Provider  aspirin EC 81 MG EC tablet Take 1 tablet (81 mg total) by mouth daily. 06/23/17  Yes Barrett, Erin R, PA-C  atorvastatin (LIPITOR) 80 MG tablet TAKE 1 TABLET BY MOUTH DAILY AT 6 PM. 11/27/17  Yes Belva Crome, MD  cetirizine Alethia Berthold)  10 MG tablet Take 10 mg by mouth daily.   Yes [provider]  chlorproMAZINE (THORAZINE) 25 MG tablet Take 1 tablet (25 mg total) by mouth 3 (three) times daily as needed. 10/04/17  Yes [name], MD  clopidogrel (PLAVIX) 75 MG tablet TAKE 1 TABLET BY MOUTH EVERY DAY 11/27/17  Yes Belva Crome, MD  dexamethasone (DECADRON) 4 MG tablet Take 1 tablet twice a day the day before, day of, and day after each cycle of chemotherapy. 11/15/17  Yes [name], MD  esomeprazole (NEXIUM) 20 MG capsule Take 20 mg by mouth every other day.    Yes [provider]  folic acid (FOLVITE) 1 MG tablet Take 1 tablet (1 mg total) by mouth daily. 09/19/17  Yes Curcio, Roselie Awkward, NP  HYDROcodone-acetaminophen (NORCO/VICODIN) 5-325 MG tablet Take 1 tablet by mouth every 6 (six) hours as needed. for pain 11/15/17  Yes [provider]  lisinopril (PRINIVIL,ZESTRIL) 5 MG tablet Take 1 tablet (5 mg total) by mouth daily. 06/23/17  Yes Barrett, Erin R, PA-C  loratadine (CLARITIN) 10 MG tablet Take 10 mg daily as needed by mouth for allergies.   Yes [provider]  metoprolol tartrate (LOPRESSOR) 25 MG tablet TAKE 1/2 TABLET BY MOUTH 2 TIMES DAILY. Patient taking differently: TAKE 1/2 TABLET  (12.5 mg) BY MOUTH 2 TIMES DAILY. 11/27/17  Yes Belva Crome, MD  prochlorperazine (COMPAZINE) 10 MG tablet Take 1 tablet (10 mg total) by mouth every 6 (six) hours as needed for nausea or vomiting. 09/19/17  Yes Curcio, Roselie Awkward, NP  sucralfate (CARAFATE) 1 g tablet Take 1 g by mouth daily. 11/07/17  Yes [provider]  oxyCODONE-acetaminophen (PERCOCET/ROXICET) 5-325 MG tablet Take 1 tablet by mouth every 4 (four) hours as needed for severe pain. Patient not taking: Reported on 11/28/2017 10/16/17   Gery Pray, MD    Family History Family History  Problem Relation Age of Onset  . Alcohol abuse Mother   . Heart disease Father   . Arrhythmia Father   . Aneurysm Maternal Grandmother     Social History Social History   Tobacco Use  . Smoking status: Never Smoker  . Smokeless tobacco: Current User    Types: Snuff  Substance Use Topics  . Alcohol use: Yes    Comment: 06/16/2017 "used to drink alot; pretty much stopped~ 01/2015; will have a couple drinks/month now"  . Drug use: No    Comment: 06/14/2017 "nothing since 2000"     Allergies   Patient has no known allergies.   Review of Systems Review of Systems    Constitutional: Negative for chills and fever.  HENT: Negative for congestion and rhinorrhea.   Respiratory: Negative for chest tightness and shortness of breath.   Cardiovascular: Negative for chest pain.  Gastrointestinal: Positive for abdominal pain. Negative for blood in stool, constipation, diarrhea, nausea and vomiting.  Genitourinary: Negative for dysuria and flank pain.  Musculoskeletal: Positive for back pain. Negative for myalgias.  Skin: Negative for color change.  Neurological: Negative for syncope.  All other systems reviewed and are negative.    Physical Exam Updated Vital Signs BP (!) 143/111 (BP Location: Right Arm)   Pulse 79   Temp 98.1 F (36.7 C) (Oral)   Resp (!) 30   Ht 5\' 10"  (1.778 m)   Wt 93 kg (205 lb)   SpO2 100%   BMI 29.41 kg/m   Physical Exam  Constitutional: He appears well-developed and well-nourished. No  distress.  HENT:  Head: Normocephalic and atraumatic.  Mouth/Throat: Oropharynx is clear and moist.  Eyes: Pupils are equal, round, and reactive to light. Conjunctivae and EOM are normal.  Neck: Normal range of motion. Neck supple.  Cardiovascular: Normal rate, regular rhythm, S1 normal and S2 normal.  No murmur heard. Radial pulses are equal bilaterally.  Pulmonary/Chest: Effort normal and breath sounds normal. He has no wheezes. He has no rales.  Abdominal: Soft. Bowel sounds are normal. He exhibits no distension. There is tenderness in the right upper quadrant and epigastric area. There is no guarding.  Patient with severe tenderness and voluntary guarding to the epigastrium and right upper quadrant on initial examination.  Musculoskeletal: Normal range of motion. He exhibits no edema or deformity.  Lymphadenopathy:    He has no cervical adenopathy.  Neurological: He is alert.  Cranial nerves grossly intact. Patient moves extremities symmetrically and with good coordination.  Skin: Skin is warm and dry. No rash noted. No erythema.   Psychiatric: He has a normal mood and affect. His behavior is normal. Judgment and thought content normal.  Nursing note and vitals reviewed.    ED Treatments / Results  Labs (all labs ordered are listed, but only abnormal results are displayed) Labs Reviewed  CBC - Abnormal; Notable for the following components:      Result Value   RBC 3.99 (*)    Hemoglobin 12.4 (*)    HCT 35.4 (*)    Platelets 134 (*)    All other components within normal limits  LIPASE, BLOOD  COMPREHENSIVE METABOLIC PANEL  URINALYSIS, ROUTINE W REFLEX MICROSCOPIC  TROPONIN I    EKG EKG Interpretation  Date/Time:  Tuesday November 28 2017 19:31:10 EDT Ventricular Rate:  67 PR Interval:    QRS Duration: 104 QT Interval:  408 QTC Calculation: 431 R Axis:   9 Text Interpretation:  Sinus rhythm Low voltage, precordial leads RSR' in V1 or V2, right VCD or RVH Baseline wander in lead(s) II V1 similar pattern to prior  Confirmed by Aletta Edouard [id number]) on 11/28/2017 9:41:49 PM   Radiology Ct Abdomen Pelvis W Contrast  Result Date: 11/28/2017 CLINICAL DATA:  History of lung cancer. Acute generalized abdominal pain. EXAM: CT ABDOMEN AND PELVIS WITH CONTRAST TECHNIQUE: Multidetector CT imaging of the abdomen and pelvis was performed using the standard protocol following bolus administration of intravenous contrast. CONTRAST:  143mL ISOVUE-300 IOPAMIDOL (ISOVUE-300) INJECTION 61% COMPARISON:  MRI of September 04, 2017.  PET scan of August 29, 2017. FINDINGS: Lower chest: No acute abnormality. Hepatobiliary: 17 mm ill-defined low density is noted and central left hepatic lobe concerning for metastatic disease as described on prior PET scan and MRI. No gallstones are noted. No biliary dilatation is noted. Pancreas: Unremarkable. No pancreatic ductal dilatation or surrounding inflammatory changes. Spleen: Normal in size without focal abnormality. Adrenals/Urinary Tract: Adrenal glands are unremarkable. Kidneys are normal,  without renal calculi, focal lesion, or hydronephrosis. Bladder is unremarkable. Stomach/Bowel: Stomach is within normal limits. Appendix appears normal. No evidence of bowel wall thickening, distention, or inflammatory changes. Sigmoid diverticulosis is noted without inflammation. Vascular/Lymphatic: No significant vascular findings are present. No enlarged abdominal or pelvic lymph nodes. Reproductive: Prostate is unremarkable. Other: No abdominal wall hernia or abnormality. No abdominopelvic ascites. Musculoskeletal: No acute or significant osseous findings. IMPRESSION: Grossly stable left hepatic lesion is noted consistent with metastatic disease. Sigmoid diverticulosis without inflammation. No other abnormality seen in the abdomen or pelvis. Electronically Signed   By: Jeneen Rinks  Murlean Caller, M.D.   On: 11/28/2017 21:05   Dg Chest Portable 1 View  Result Date: 11/28/2017 CLINICAL DATA:  Severe mid abdominal pain for a few days. History of lung cancer. EXAM: PORTABLE CHEST 1 VIEW COMPARISON:  Head CT August 29, 2017 and chest radiograph August 03, 2016 FINDINGS: Cardiomediastinal silhouette is unremarkable for this low inspiratory examination with crowded vasculature markings. Status post median sternotomy. The lungs are clear without pleural effusions or focal consolidations. Trachea projects midline and there is no pneumothorax. Included soft tissue planes and osseous structures are non-suspicious. IMPRESSION: No active disease. Electronically Signed   By: Elon Alas M.D.   On: 11/28/2017 19:58   Ct Angio Chest Aorta W And/or Wo Contrast  Result Date: 11/28/2017 CLINICAL DATA:  History of lung cancer abdominal pain EXAM: CT ANGIOGRAPHY CHEST WITH CONTRAST TECHNIQUE: Multidetector CT imaging of the chest was performed using the standard protocol during bolus administration of intravenous contrast. Multiplanar CT image reconstructions and MIPs were obtained to evaluate the vascular anatomy. CONTRAST:   75 cc Isovue 370 intravenous COMPARISON:  CT abdomen pelvis 11/28/2017, PET CT 08/29/2017 FINDINGS: Cardiovascular: Non contrasted images of the chest demonstrate no intramural hematoma. Coronary vascular calcifications. Post CABG changes. No dissection is seen. Nonaneurysmal aorta. Normal heart size. No pericardial effusion. Mediastinum/Nodes: Midline trachea. No thyroid mass. Decreased size of right upper paratracheal lymph node now measuring 4 mm, compared with 10 mm previously. No new adenopathy. Fluid distention of the esophagus. Lungs/Pleura: No acute consolidation or pleural effusion. Significant decrease in size of the right upper lobe lung mass, residual soft tissue density in the region measuring 14 x 8 mm compared with previous measurements of 24 x 18 mm. Upper Abdomen: Hyperenhancement near the gallbladder fossa. Vague hypodensity in the left hepatic lobe corresponding to metastatic focus better seen on the abdominopelvic CT. Musculoskeletal: Post sternotomy changes. No acute or suspicious abnormality Review of the MIP images confirms the above findings. IMPRESSION: 1. Negative for acute aortic dissection or aneurysm. 2. Significant decrease in size of right upper lobe lung mass and decreased mediastinal adenopathy compared to prior. 3. Diffuse fluid distension of the esophagus without esophageal wall thickening. 4. Known hepatic metastatic focus is better seen on the abdominopelvic CT Electronically Signed   By: Donavan Foil M.D.   On: 11/28/2017 23:34    Procedures Procedures (including critical care time)  Medications Ordered in ED Medications  sodium chloride 0.9 % bolus 1,000 mL (1,000 mLs Intravenous New Bag/Given 11/28/17 1921)  iopamidol (ISOVUE-300) 61 % injection (has no administration in time range)  sodium chloride 0.9 % injection (has no administration in time range)  HYDROmorphone (DILAUDID) injection 1.5 mg (1.5 mg Intravenous Given 11/28/17 1921)     Initial Impression /  Assessment and Plan / ED Course  I have reviewed the triage vital signs and the nursing notes.  Pertinent labs & imaging results that were available during my care of the patient were reviewed by me and considered in my medical decision making (see chart for details).  Clinical Course as of Nov 29 157  Tue Nov 29, 4662  6559 47 year old man with history of lung cancer and MI acute onset severe intrascapular pain that ultimately turned in the subxiphoid pain that was severe in nature.  He is never had anything like this before.  States it felt like an incredible hunger pain and cramps that was severe in nature.  His lab work and CT abdomen and pelvis did not show an  obvious cause of his symptoms.  He still pending a troponin and EKG.  His pain is improved after Dilaudid.   [MB]  2145 Spoke with Dr. Nyoka Cowden of [facility] radiology, who read patient's initial abdominal CT.  Discussed patient's continued pain and negative CT, and discussed differential diagnosis.  Per Dr. Nyoka Cowden, if clinical concern continues to exist for either pulmonary embolism or aortic dissection, patient should receive study with whichever has the highest pretest probability, as contrast load would be suboptimal for aneurysm to choose between CTA and CTPA.  Also suggested that patient should be able to receive an additional contrast load if normal renal function, normal GFR, and adequate hydration.   [AM]  2227 Spoke with Dr. Maryland Pink of Radiology who recommends reducing contrast load after troponin returns. If negative and patient still having severe pain, will perform CTA.   [AM]  2330 Patient reassessed.  Patient reporting the pain is tolerable.   [AM]  Wed Nov 29, 2017  0038 Spoke with Dr. Alcario Drought of hospital medicine, who will evaluate the patient.  Suggested attempting GI cocktail and see if pain improves based on findings in distal esophagus on Ct. Also discussed following up in AM for renal function.   [AM]    Clinical  Course User Index [AM] Albesa Seen, PA-C [MB] Hayden Rasmussen, MD    Patient afebrile, not tachycardic, normotensive.  Patient with significant epigastric to right upper quadrant discomfort.  Differential diagnosis includes pancreatitis, perforated viscus, ACS, biliary obstruction, gastric or duodenal ulcer, esophageal spasm or obstruction.  Will obtain 1 view chest x-ray, CT abdomen and pelvis, troponin, EKG, CBC, CMP.  After additional history of physical exam with work-up, patient clarifying the pain began more in the shoulder blades as a discomfort, and migrated to the epigastrium.  Discussed with [facility] radiology as above, and if concern exists for pulmonary embolism, or aortic dissection, additional study should be pursued after troponin.  Laboratory analysis negative for leukocytosis, or transaminitis.  Normal renal function with GFR greater than 60.  Initial troponin negative.  EKG without acute ischemia, infarction, or arrhythmia.  At this time, high suspicion for PUD, esophagitis, or esophageal dysmotility.  Will add Protonix.  If PE needs to be pursued further based on symptomatology, will discuss with hospitalist. Additionally, placed delta troponin order.  This is a shared visit with Dr. Aletta Edouard. Patient was independently evaluated by this attending physician. Attending physician consulted in evaluation and admission management.  Final Clinical Impressions(s) / ED Diagnoses   Final diagnoses:  Epigastric pain    ED Discharge Orders    None       Tamala Julian 11/29/17 0203    Hayden Rasmussen, MD 11/29/17 1753

## 2017-11-28 NOTE — ED Notes (Signed)
[id number] Anthony [name] would like to be updated on pt status

## 2017-11-29 ENCOUNTER — Other Ambulatory Visit: Payer: Self-pay

## 2017-11-29 ENCOUNTER — Inpatient Hospital Stay: Payer: BLUE CROSS/BLUE SHIELD

## 2017-11-29 ENCOUNTER — Telehealth: Payer: Self-pay | Admitting: Medical Oncology

## 2017-11-29 ENCOUNTER — Encounter (HOSPITAL_COMMUNITY): Payer: Self-pay | Admitting: Internal Medicine

## 2017-11-29 DIAGNOSIS — Q396 Congenital diverticulum of esophagus: Secondary | ICD-10-CM | POA: Diagnosis not present

## 2017-11-29 DIAGNOSIS — K225 Diverticulum of esophagus, acquired: Secondary | ICD-10-CM | POA: Diagnosis not present

## 2017-11-29 DIAGNOSIS — I2510 Atherosclerotic heart disease of native coronary artery without angina pectoris: Secondary | ICD-10-CM

## 2017-11-29 DIAGNOSIS — R1013 Epigastric pain: Secondary | ICD-10-CM | POA: Diagnosis present

## 2017-11-29 DIAGNOSIS — Z951 Presence of aortocoronary bypass graft: Secondary | ICD-10-CM

## 2017-11-29 DIAGNOSIS — K449 Diaphragmatic hernia without obstruction or gangrene: Secondary | ICD-10-CM | POA: Diagnosis not present

## 2017-11-29 DIAGNOSIS — C3491 Malignant neoplasm of unspecified part of right bronchus or lung: Secondary | ICD-10-CM

## 2017-11-29 LAB — TROPONIN I

## 2017-11-29 MED ORDER — SUCRALFATE 1 G PO TABS
1.0000 g | ORAL_TABLET | Freq: Every day | ORAL | Status: DC
  Administered 2017-12-01: 1 g via ORAL
  Filled 2017-11-29: qty 1

## 2017-11-29 MED ORDER — HYDROMORPHONE HCL 1 MG/ML IJ SOLN
1.0000 mg | Freq: Once | INTRAMUSCULAR | Status: AC
  Administered 2017-11-29: 1 mg via INTRAVENOUS
  Filled 2017-11-29: qty 1

## 2017-11-29 MED ORDER — ATORVASTATIN CALCIUM 40 MG PO TABS
80.0000 mg | ORAL_TABLET | Freq: Every day | ORAL | Status: DC
  Administered 2017-11-29 – 2017-12-01 (×3): 80 mg via ORAL
  Filled 2017-11-29 (×3): qty 2

## 2017-11-29 MED ORDER — METOPROLOL TARTRATE 12.5 MG HALF TABLET
12.5000 mg | ORAL_TABLET | Freq: Two times a day (BID) | ORAL | Status: DC
  Administered 2017-11-29 – 2017-12-01 (×4): 12.5 mg via ORAL
  Filled 2017-11-29 (×4): qty 1

## 2017-11-29 MED ORDER — ONDANSETRON HCL 4 MG/2ML IJ SOLN: 4.0000 mg | Freq: Four times a day (QID) | INTRAMUSCULAR | Status: DC | PRN

## 2017-11-29 MED ORDER — HYDROMORPHONE HCL 1 MG/ML IJ SOLN
1.0000 mg | INTRAMUSCULAR | Status: DC | PRN
  Administered 2017-11-29 – 2017-12-01 (×12): 1 mg via INTRAVENOUS
  Filled 2017-11-29 (×12): qty 1

## 2017-11-29 MED ORDER — ACETAMINOPHEN 325 MG PO TABS
650.0000 mg | ORAL_TABLET | ORAL | Status: DC | PRN
  Administered 2017-11-29 – 2017-11-30 (×3): 650 mg via ORAL
  Filled 2017-11-29 (×3): qty 2

## 2017-11-29 MED ORDER — FOLIC ACID 1 MG PO TABS
1.0000 mg | ORAL_TABLET | Freq: Every day | ORAL | Status: DC
  Administered 2017-12-01: 1 mg via ORAL
  Filled 2017-11-29: qty 1

## 2017-11-29 MED ORDER — PANTOPRAZOLE SODIUM 40 MG PO TBEC
40.0000 mg | DELAYED_RELEASE_TABLET | Freq: Every day | ORAL | Status: DC
  Administered 2017-12-01: 40 mg via ORAL
  Filled 2017-11-29: qty 1

## 2017-11-29 MED ORDER — ENOXAPARIN SODIUM 40 MG/0.4ML ~~LOC~~ SOLN
40.0000 mg | SUBCUTANEOUS | Status: DC
  Administered 2017-11-29 – 2017-12-01 (×3): 40 mg via SUBCUTANEOUS
  Filled 2017-11-29 (×3): qty 0.4

## 2017-11-29 MED ORDER — GI COCKTAIL ~~LOC~~
30.0000 mL | Freq: Three times a day (TID) | ORAL | Status: DC | PRN
  Administered 2017-11-29 – 2017-12-01 (×2): 30 mL via ORAL
  Filled 2017-11-29 (×2): qty 30

## 2017-11-29 MED ORDER — ASPIRIN EC 81 MG PO TBEC
81.0000 mg | DELAYED_RELEASE_TABLET | Freq: Every day | ORAL | Status: DC
  Administered 2017-12-01: 81 mg via ORAL
  Filled 2017-11-29: qty 1

## 2017-11-29 MED ORDER — SODIUM CHLORIDE 0.9 % IV SOLN
INTRAVENOUS | Status: DC
  Administered 2017-11-29 – 2017-11-30 (×2): via INTRAVENOUS

## 2017-11-29 MED ORDER — LISINOPRIL 5 MG PO TABS
5.0000 mg | ORAL_TABLET | Freq: Every day | ORAL | Status: DC
  Administered 2017-12-01: 5 mg via ORAL
  Filled 2017-11-29: qty 1

## 2017-11-29 MED ORDER — CLOPIDOGREL BISULFATE 75 MG PO TABS
75.0000 mg | ORAL_TABLET | Freq: Every day | ORAL | Status: DC
  Administered 2017-12-01: 75 mg via ORAL
  Filled 2017-11-29: qty 1

## 2017-11-29 MED ORDER — PROCHLORPERAZINE MALEATE 10 MG PO TABS: 10.0000 mg | ORAL_TABLET | Freq: Four times a day (QID) | ORAL | Status: DC | PRN

## 2017-11-29 MED ORDER — CHLORPROMAZINE HCL 25 MG PO TABS
25.0000 mg | ORAL_TABLET | Freq: Three times a day (TID) | ORAL | Status: DC | PRN
Start: 2017-11-29 — End: 2017-12-01
  Filled 2017-11-29: qty 1

## 2017-11-29 NOTE — ED Notes (Signed)
ED TO INPATIENT HANDOFF REPORT  Name/Age/Gender Anthony [name] 47 y.o. male  Code Status    Code Status Orders  (From admission, onward)        Start     Ordered   11/29/17 0048  Full code  Continuous     11/29/17 0050    Code Status History    Date Active Date Inactive Code Status Order ID Comments User Context   06/14/2017 0541 06/23/2017 1448 Full Code [id number]  Anthony Sheriff, MD ED      Home/SNF/Other Home  Chief Complaint cancer pt with severe abdomial paind, open surgery in nov  Level of Care/Admitting Diagnosis ED Disposition    ED Disposition Condition [place]: [facility] [100102]  Level of Care: Telemetry [5]  Admit to tele based on following criteria: Monitor for Ischemic changes  Diagnosis: Epigastric pain [086578]  Admitting Physician: Etta Quill [[id number]]  Attending Physician: Etta Quill [[id number]]  PT Class (Do Not Modify): Observation [104]  PT Acc Code (Do Not Modify): Observation [10022]       Medical History Past Medical History:  Diagnosis Date  . Acid reflux   . History of radiation therapy 09/21/17-10/04/17   C4 spine 30 Gy in 10 fractions, left femur 30 Gy in 10 fractions  . Non-small cell lung cancer ([place])   . NSTEMI (non-ST elevated myocardial infarction) ([place]) 06/13/2017   [name] 06/14/2017  . Tailbone injury since 1993   "cracked"    Allergies No Known Allergies  IV Location/Drains/Wounds Patient Lines/Drains/Airways Status   Active Line/Drains/Airways    Name:   Placement date:   Placement time:   Site:   Days:   Peripheral IV 11/28/17 Left Antecubital   11/28/17    1917    Antecubital   1   Incision (Closed) 06/19/17 Chest Other (Comment)   06/19/17    0832     163   Wound / Incision (Open or Dehisced) 09/06/17 Puncture Abdomen Right;Upper   09/06/17    0900    Abdomen   84          Labs/Imaging Results for orders placed or performed during the hospital encounter of  11/28/17 (from the past 48 hour(s))  Lipase, blood     Status: None   Collection Time: 11/28/17  7:20 PM  Result Value Ref Range   Lipase 32 11 - 51 U/L    Comment: Performed at [facility], [place] [address], [place], [name] [address]  Comprehensive metabolic panel     Status: Abnormal   Collection Time: 11/28/17  7:20 PM  Result Value Ref Range   Sodium 139 135 - 145 mmol/L   Potassium 3.8 3.5 - 5.1 mmol/L   Chloride 102 101 - 111 mmol/L   CO2 28 22 - 32 mmol/L   Glucose, Bld 102 (H) 65 - 99 mg/dL   BUN 18 6 - 20 mg/dL   Creatinine, Ser 1.03 0.61 - 1.24 mg/dL   Calcium 9.4 8.9 - 10.3 mg/dL   Total Protein 7.2 6.5 - 8.1 g/dL   Albumin 4.1 3.5 - 5.0 g/dL   AST 28 15 - 41 U/L   ALT 67 (H) 17 - 63 U/L   Alkaline Phosphatase 43 38 - 126 U/L   Total Bilirubin 1.0 0.3 - 1.2 mg/dL   GFR calc non Af Amer >60 >60 mL/min   GFR calc Af Amer >60 >60 mL/min    Comment: (  NOTE) The eGFR has been calculated using the CKD EPI equation. This calculation has not been validated in all clinical situations. eGFR's persistently <60 mL/min signify possible Chronic Kidney Disease.    Anion gap 9 5 - 15    Comment: Performed at [facility], [place] [address], [place], [place] [address]  CBC     Status: Abnormal   Collection Time: 11/28/17  7:20 PM  Result Value Ref Range   WBC 7.5 4.0 - 10.5 K/uL   RBC 3.99 (L) 4.22 - 5.81 MIL/uL   Hemoglobin 12.4 (L) 13.0 - 17.0 g/dL   HCT 35.4 (L) 39.0 - 52.0 %   MCV 88.7 78.0 - 100.0 fL   MCH 31.1 26.0 - 34.0 pg   MCHC 35.0 30.0 - 36.0 g/dL   RDW 15.2 11.5 - 15.5 %   Platelets 134 (L) 150 - 400 K/uL    Comment: Performed at [facility], [place] [address] [place], [name] [address]  Troponin I     Status: None   Collection Time: 11/28/17  9:30 PM  Result Value Ref Range   Troponin I <0.03 <0.03 ng/mL    Comment: Performed at [facility], [place] [address], [place], [place] [address]   Urinalysis, Routine w reflex microscopic     Status: Abnormal   Collection Time: 11/28/17  9:55 PM  Result Value Ref Range   Color, Urine YELLOW YELLOW   APPearance CLEAR CLEAR   Specific Gravity, Urine >1.046 (H) 1.005 - 1.030   pH 8.0 5.0 - 8.0   Glucose, UA NEGATIVE NEGATIVE mg/dL   Hgb urine dipstick NEGATIVE NEGATIVE   Bilirubin Urine NEGATIVE NEGATIVE   Ketones, ur NEGATIVE NEGATIVE mg/dL   Protein, ur NEGATIVE NEGATIVE mg/dL   Nitrite NEGATIVE NEGATIVE   Leukocytes, UA NEGATIVE NEGATIVE    Comment: Performed at [place] [address] [place], [place] [address]   Ct Abdomen Pelvis W Contrast  Result Date: 11/28/2017 CLINICAL DATA:  History of lung cancer. Acute generalized abdominal pain. EXAM: CT ABDOMEN AND PELVIS WITH CONTRAST TECHNIQUE: Multidetector CT imaging of the abdomen and pelvis was performed using the standard protocol following bolus administration of intravenous contrast. CONTRAST:  122m ISOVUE-300 IOPAMIDOL (ISOVUE-300) INJECTION 61% COMPARISON:  MRI of September 04, 2017.  PET scan of August 29, 2017. FINDINGS: Lower chest: No acute abnormality. Hepatobiliary: 17 mm ill-defined low density is noted and central left hepatic lobe concerning for metastatic disease as described on prior PET scan and MRI. No gallstones are noted. No biliary dilatation is noted. Pancreas: Unremarkable. No pancreatic ductal dilatation or surrounding inflammatory changes. Spleen: Normal in size without focal abnormality. Adrenals/Urinary Tract: Adrenal glands are unremarkable. Kidneys are normal, without renal calculi, focal lesion, or hydronephrosis. Bladder is unremarkable. Stomach/Bowel: Stomach is within normal limits. Appendix appears normal. No evidence of bowel wall thickening, distention, or inflammatory changes. Sigmoid diverticulosis is noted without inflammation. Vascular/Lymphatic: No significant vascular findings are present. No enlarged abdominal or pelvic  lymph nodes. Reproductive: Prostate is unremarkable. Other: No abdominal wall hernia or abnormality. No abdominopelvic ascites. Musculoskeletal: No acute or significant osseous findings. IMPRESSION: Grossly stable left hepatic lesion is noted consistent with metastatic disease. Sigmoid diverticulosis without inflammation. No other abnormality seen in the abdomen or pelvis. Electronically Signed   By: JMarijo Conception M.D.   On: 11/28/2017 21:05   Dg Chest Portable 1 View  Result Date: 11/28/2017 CLINICAL DATA:  Severe mid abdominal pain for  a few days. History of lung cancer. EXAM: PORTABLE CHEST 1 VIEW COMPARISON:  Head CT August 29, 2017 and chest radiograph August 03, 2016 FINDINGS: Cardiomediastinal silhouette is unremarkable for this low inspiratory examination with crowded vasculature markings. Status post median sternotomy. The lungs are clear without pleural effusions or focal consolidations. Trachea projects midline and there is no pneumothorax. Included soft tissue planes and osseous structures are non-suspicious. IMPRESSION: No active disease. Electronically Signed   By: Elon Alas M.D.   On: 11/28/2017 19:58   Ct Angio Chest Aorta W And/or Wo Contrast  Result Date: 11/28/2017 CLINICAL DATA:  History of lung cancer abdominal pain EXAM: CT ANGIOGRAPHY CHEST WITH CONTRAST TECHNIQUE: Multidetector CT imaging of the chest was performed using the standard protocol during bolus administration of intravenous contrast. Multiplanar CT image reconstructions and MIPs were obtained to evaluate the vascular anatomy. CONTRAST:  75 cc Isovue 370 intravenous COMPARISON:  CT abdomen pelvis 11/28/2017, PET CT 08/29/2017 FINDINGS: Cardiovascular: Non contrasted images of the chest demonstrate no intramural hematoma. Coronary vascular calcifications. Post CABG changes. No dissection is seen. Nonaneurysmal aorta. Normal heart size. No pericardial effusion. Mediastinum/Nodes: Midline trachea. No thyroid mass.  Decreased size of right upper paratracheal lymph node now measuring 4 mm, compared with 10 mm previously. No new adenopathy. Fluid distention of the esophagus. Lungs/Pleura: No acute consolidation or pleural effusion. Significant decrease in size of the right upper lobe lung mass, residual soft tissue density in the region measuring 14 x 8 mm compared with previous measurements of 24 x 18 mm. Upper Abdomen: Hyperenhancement near the gallbladder fossa. Vague hypodensity in the left hepatic lobe corresponding to metastatic focus better seen on the abdominopelvic CT. Musculoskeletal: Post sternotomy changes. No acute or suspicious abnormality Review of the MIP images confirms the above findings. IMPRESSION: 1. Negative for acute aortic dissection or aneurysm. 2. Significant decrease in size of right upper lobe lung mass and decreased mediastinal adenopathy compared to prior. 3. Diffuse fluid distension of the esophagus without esophageal wall thickening. 4. Known hepatic metastatic focus is better seen on the abdominopelvic CT Electronically Signed   By: Donavan Foil M.D.   On: 11/28/2017 23:34    Pending Labs Unresulted Labs (From admission, onward)   Start     Ordered   11/29/17 0051  Troponin I-serum (0, 3, 6 hours)  Now then every 3 hours,   TIMED     11/29/17 0050   11/29/17 0030  Troponin I  Once,   STAT     11/28/17 2309      Vitals/Pain Today's Vitals   11/28/17 2230 11/29/17 0004 11/29/17 0007 11/29/17 0033  BP:  (!) 155/104  (!) 169/105  Pulse:  76  86  Resp:  18  17  Temp:      TempSrc:      SpO2:  100%  100%  Weight:      Height:      PainSc: 6   6      Isolation Precautions No active isolations  Medications Medications  sodium chloride 0.9 % injection (has no administration in time range)  iopamidol (ISOVUE-370) 76 % injection 100 mL ( Intravenous Canceled Entry 11/28/17 2247)  sodium chloride 0.9 % injection (has no administration in time range)  gi cocktail  (Maalox,Lidocaine,Donnatal) (has no administration in time range)  HYDROmorphone (DILAUDID) injection 1 mg (has no administration in time range)  pantoprazole (PROTONIX) EC tablet 40 mg (has no administration in time range)  atorvastatin (LIPITOR) tablet 80  mg (has no administration in time range)  aspirin EC tablet 81 mg (has no administration in time range)  chlorproMAZINE (THORAZINE) tablet 25 mg (has no administration in time range)  clopidogrel (PLAVIX) tablet 75 mg (has no administration in time range)  folic acid (FOLVITE) tablet 1 mg (has no administration in time range)  lisinopril (PRINIVIL,ZESTRIL) tablet 5 mg (has no administration in time range)  metoprolol tartrate (LOPRESSOR) tablet 12.5 mg (has no administration in time range)  prochlorperazine (COMPAZINE) tablet 10 mg (has no administration in time range)  sucralfate (CARAFATE) tablet 1 g (has no administration in time range)  acetaminophen (TYLENOL) tablet 650 mg (has no administration in time range)  ondansetron (ZOFRAN) injection 4 mg (has no administration in time range)  enoxaparin (LOVENOX) injection 40 mg (has no administration in time range)  sodium chloride 0.9 % bolus 1,000 mL (0 mLs Intravenous Stopped 11/28/17 2044)  HYDROmorphone (DILAUDID) injection 1.5 mg (1.5 mg Intravenous Given 11/28/17 1921)  iopamidol (ISOVUE-300) 61 % injection 100 mL (100 mLs Intravenous Contrast Given 11/28/17 2028)  HYDROmorphone (DILAUDID) injection 1 mg (1 mg Intravenous Given 11/28/17 2133)  sodium chloride 0.9 % bolus 1,000 mL (0 mLs Intravenous Stopped 11/28/17 2350)  iopamidol (ISOVUE-370) 76 % injection 100 mL (75 mLs Intravenous Contrast Given 11/28/17 2251)  HYDROmorphone (DILAUDID) injection 1 mg (1 mg Intravenous Given 11/28/17 2229)  sodium chloride 0.9 % bolus 1,000 mL (1,000 mLs Intravenous New Bag/Given 11/28/17 2357)  pantoprazole (PROTONIX) injection 40 mg (40 mg Intravenous Given 11/28/17 2357)  HYDROmorphone (DILAUDID)  injection 1 mg (1 mg Intravenous Given 11/29/17 0031)    Mobility walks

## 2017-11-29 NOTE — H&P (Signed)
History and Physical    Anthony [name] EGB:[id number] DOB: Aug 24, 1970 DOA: 11/28/2017  PCP: [name], MD  Patient coming from: Home  I have personally briefly reviewed patient's old medical records in [place]  Chief Complaint: Epigastric pain  HPI: Anthony [name] is a 47 y.o. male with medical history significant of CAD, NSTEMI Nov last year, 1 vessel CABG also in Nov; NSCLC on chemo, immuno, and rad therapy.  Patient presents to the ED with c/o Epigastric abd pain.  began suddenly this morning around 5 PM and  patient initially reported feeling into his back, but has felt a gnawing, severe, dull pain in the epigastrium to right upper quadrant all day without relief.  Patient reports it feels like he has a deep hunger.  Patient denies any nausea or vomiting with this pain.  Patient reports last bowel movement was today and normal without melena or hematochezia.  Patient denies fever or chills. Patient reporting that the only other time he had similar symptoms was when he had an esophageal spasm after extubation from his CABG.   ED Course: Pain controlled after multiple rounds of IV dilaudid, trop neg, EKG unchanged.  CT neg for dissection or large PE.  Hospitalist asked to admit for obs.   Review of Systems: As per HPI otherwise 10 point review of systems negative.   Past Medical History:  Diagnosis Date  . Acid reflux   . History of radiation therapy 09/21/17-10/04/17   C4 spine 30 Gy in 10 fractions, left femur 30 Gy in 10 fractions  . Non-small cell lung cancer ([place])   . NSTEMI (non-ST elevated myocardial infarction) ([place]) 06/13/2017   [name] 06/14/2017  . Tailbone injury since 1993   "cracked"    Past Surgical History:  Procedure Laterality Date  . CARDIAC CATHETERIZATION  06/14/2017  . CORONARY ARTERY BYPASS GRAFT N/A 06/19/2017   Procedure: CORONARY ARTERY BYPASS GRAFTING (CABG), OFF PUMP, times one using the left internal mammary artery to LAD;  Surgeon:  Grace Isaac, MD;  Location: [facility];  Service: Open Heart Surgery;  Laterality: N/A;  . LEFT HEART CATH AND CORONARY ANGIOGRAPHY N/A 06/14/2017   Procedure: LEFT HEART CATH AND CORONARY ANGIOGRAPHY;  Surgeon: Sherren Mocha, MD;  Location: [facility];  Service: Cardiovascular;  Laterality: N/A;  . TEE WITHOUT CARDIOVERSION N/A 06/19/2017   Procedure: TRANSESOPHAGEAL ECHOCARDIOGRAM (TEE);  Surgeon: Grace Isaac, MD;  Location: [facility];  Service: Open Heart Surgery;  Laterality: N/A;  . TONSILLECTOMY  ~ 1977     reports that he has never smoked. His smokeless tobacco use includes snuff. He reports that he drinks alcohol. He reports that he does not use drugs.  No Known Allergies  Family History  Problem Relation Age of Onset  . Alcohol abuse Mother   . Heart disease Father   . Arrhythmia Father   . Aneurysm Maternal Grandmother      Prior to Admission medications   Medication Sig Start Date End Date Taking? Authorizing Provider  aspirin EC 81 MG EC tablet Take 1 tablet (81 mg total) by mouth daily. 06/23/17  Yes Barrett, Erin R, PA-C  atorvastatin (LIPITOR) 80 MG tablet TAKE 1 TABLET BY MOUTH DAILY AT 6 PM. 11/27/17  Yes Belva Crome, MD  cetirizine (ZYRTEC) 10 MG tablet Take 10 mg by mouth daily.   Yes [provider]  chlorproMAZINE (THORAZINE) 25 MG tablet Take 1 tablet (25 mg total) by mouth 3 (three) times daily as  needed. 10/04/17  Yes [name], MD  clopidogrel (PLAVIX) 75 MG tablet TAKE 1 TABLET BY MOUTH EVERY DAY 11/27/17  Yes Belva Crome, MD  dexamethasone (DECADRON) 4 MG tablet Take 1 tablet twice a day the day before, day of, and day after each cycle of chemotherapy. 11/15/17  Yes [name], MD  esomeprazole (NEXIUM) 20 MG capsule Take 20 mg by mouth every other day.    Yes [provider]  folic acid (FOLVITE) 1 MG tablet Take 1 tablet (1 mg total) by mouth daily. 09/19/17  Yes Curcio, Roselie Awkward, NP  HYDROcodone-acetaminophen  (NORCO/VICODIN) 5-325 MG tablet Take 1 tablet by mouth every 6 (six) hours as needed. for pain 11/15/17  Yes [provider]  lisinopril (PRINIVIL,ZESTRIL) 5 MG tablet Take 1 tablet (5 mg total) by mouth daily. 06/23/17  Yes Barrett, Erin R, PA-C  loratadine (CLARITIN) 10 MG tablet Take 10 mg daily as needed by mouth for allergies.   Yes [provider]  metoprolol tartrate (LOPRESSOR) 25 MG tablet TAKE 1/2 TABLET BY MOUTH 2 TIMES DAILY. Patient taking differently: TAKE 1/2 TABLET  (12.5 mg) BY MOUTH 2 TIMES DAILY. 11/27/17  Yes Belva Crome, MD  prochlorperazine (COMPAZINE) 10 MG tablet Take 1 tablet (10 mg total) by mouth every 6 (six) hours as needed for nausea or vomiting. 09/19/17  Yes Curcio, Roselie Awkward, NP  sucralfate (CARAFATE) 1 g tablet Take 1 g by mouth daily. 11/07/17  Yes [provider]    Physical Exam: Vitals:   11/28/17 2136 11/28/17 2230 11/29/17 0004 11/29/17 0033  BP: (!) 176/103 (!) 156/103 (!) 155/104 (!) 169/105  Pulse: 60 65 76 86  Resp: 13 14 18 17   Temp:      TempSrc:      SpO2: 100% 100% 100% 100%  Weight:      Height:        Constitutional: NAD, calm, comfortable Eyes: PERRL, lids and conjunctivae normal ENMT: Mucous membranes are moist. Posterior pharynx clear of any exudate or lesions.Normal dentition.  Neck: normal, supple, no masses, no thyromegaly Respiratory: clear to auscultation bilaterally, no wheezing, no crackles. Normal respiratory effort. No accessory muscle use.  Cardiovascular: Regular rate and rhythm, no murmurs / rubs / gallops. No extremity edema. 2+ pedal pulses. No carotid bruits.  Abdomen: no tenderness, no masses palpated. No hepatosplenomegaly. Bowel sounds positive.  Musculoskeletal: no clubbing / cyanosis. No joint deformity upper and lower extremities. Good ROM, no contractures. Normal muscle tone.  Skin: no rashes, lesions, ulcers. No induration Neurologic: CN 2-12 grossly intact. Sensation intact, DTR  normal. Strength 5/5 in all 4.  Psychiatric: Normal judgment and insight. Alert and oriented x 3. Normal mood.    Labs on Admission: I have personally reviewed following labs and imaging studies  CBC: Recent Labs  Lab 11/22/17 0748 11/28/17 1920  WBC 3.6* 7.5  NEUTROABS 1.7  --   HGB 12.5* 12.4*  HCT 35.3* 35.4*  MCV 86.7 88.7  PLT 292 786*   Basic Metabolic Panel: Recent Labs  Lab 11/22/17 0748 11/28/17 1920  NA 140 139  K 3.7 3.8  CL 104 102  CO2 24 28  GLUCOSE 112 102*  BUN 20 18  CREATININE 0.83 1.03  CALCIUM 10.0 9.4   GFR: Estimated Creatinine Clearance: 101.6 mL/min (by C-G formula based on SCr of 1.03 mg/dL). Liver Function Tests: Recent Labs  Lab 11/22/17 0748 11/28/17 1920  AST 24 28  ALT 67* 67*  ALKPHOS 50 43  BILITOT 0.6 1.0  PROT 7.0 7.2  ALBUMIN 3.9 4.1   Recent Labs  Lab 11/28/17 1920  LIPASE 32   No results for input(s): AMMONIA in the last 168 hours. Coagulation Profile: No results for input(s): INR, PROTIME in the last 168 hours. Cardiac Enzymes: Recent Labs  Lab 11/28/17 2130  TROPONINI <0.03   BNP (last 3 results) No results for input(s): PROBNP in the last 8760 hours. HbA1C: No results for input(s): HGBA1C in the last 72 hours. CBG: No results for input(s): GLUCAP in the last 168 hours. Lipid Profile: No results for input(s): CHOL, HDL, LDLCALC, TRIG, CHOLHDL, LDLDIRECT in the last 72 hours. Thyroid Function Tests: No results for input(s): TSH, T4TOTAL, FREET4, T3FREE, THYROIDAB in the last 72 hours. Anemia Panel: No results for input(s): VITAMINB12, FOLATE, FERRITIN, TIBC, IRON, RETICCTPCT in the last 72 hours. Urine analysis:    Component Value Date/Time   COLORURINE YELLOW 11/28/2017 2155   APPEARANCEUR CLEAR 11/28/2017 2155   LABSPEC >1.046 (H) 11/28/2017 2155   PHURINE 8.0 11/28/2017 2155   GLUCOSEU NEGATIVE 11/28/2017 2155   HGBUR NEGATIVE 11/28/2017 2155   BILIRUBINUR NEGATIVE 11/28/2017 2155   KETONESUR  NEGATIVE 11/28/2017 2155   PROTEINUR NEGATIVE 11/28/2017 2155   NITRITE NEGATIVE 11/28/2017 2155   LEUKOCYTESUR NEGATIVE 11/28/2017 2155    Radiological Exams on Admission: Ct Abdomen Pelvis W Contrast  Result Date: 11/28/2017 CLINICAL DATA:  History of lung cancer. Acute generalized abdominal pain. EXAM: CT ABDOMEN AND PELVIS WITH CONTRAST TECHNIQUE: Multidetector CT imaging of the abdomen and pelvis was performed using the standard protocol following bolus administration of intravenous contrast. CONTRAST:  172mL ISOVUE-300 IOPAMIDOL (ISOVUE-300) INJECTION 61% COMPARISON:  MRI of September 04, 2017.  PET scan of August 29, 2017. FINDINGS: Lower chest: No acute abnormality. Hepatobiliary: 17 mm ill-defined low density is noted and central left hepatic lobe concerning for metastatic disease as described on prior PET scan and MRI. No gallstones are noted. No biliary dilatation is noted. Pancreas: Unremarkable. No pancreatic ductal dilatation or surrounding inflammatory changes. Spleen: Normal in size without focal abnormality. Adrenals/Urinary Tract: Adrenal glands are unremarkable. Kidneys are normal, without renal calculi, focal lesion, or hydronephrosis. Bladder is unremarkable. Stomach/Bowel: Stomach is within normal limits. Appendix appears normal. No evidence of bowel wall thickening, distention, or inflammatory changes. Sigmoid diverticulosis is noted without inflammation. Vascular/Lymphatic: No significant vascular findings are present. No enlarged abdominal or pelvic lymph nodes. Reproductive: Prostate is unremarkable. Other: No abdominal wall hernia or abnormality. No abdominopelvic ascites. Musculoskeletal: No acute or significant osseous findings. IMPRESSION: Grossly stable left hepatic lesion is noted consistent with metastatic disease. Sigmoid diverticulosis without inflammation. No other abnormality seen in the abdomen or pelvis. Electronically Signed   By: Marijo Conception, M.D.   On: 11/28/2017  21:05   Dg Chest Portable 1 View  Result Date: 11/28/2017 CLINICAL DATA:  Severe mid abdominal pain for a few days. History of lung cancer. EXAM: PORTABLE CHEST 1 VIEW COMPARISON:  Head CT August 29, 2017 and chest radiograph August 03, 2016 FINDINGS: Cardiomediastinal silhouette is unremarkable for this low inspiratory examination with crowded vasculature markings. Status post median sternotomy. The lungs are clear without pleural effusions or focal consolidations. Trachea projects midline and there is no pneumothorax. Included soft tissue planes and osseous structures are non-suspicious. IMPRESSION: No active disease. Electronically Signed   By: Elon Alas M.D.   On: 11/28/2017 19:58   Ct Angio Chest Aorta W And/or Wo Contrast  Result Date: 11/28/2017 CLINICAL DATA:  History of lung cancer abdominal pain EXAM: CT ANGIOGRAPHY CHEST WITH CONTRAST TECHNIQUE: Multidetector CT imaging of the chest was performed using the standard protocol during bolus administration of intravenous contrast. Multiplanar CT image reconstructions and MIPs were obtained to evaluate the vascular anatomy. CONTRAST:  75 cc Isovue 370 intravenous COMPARISON:  CT abdomen pelvis 11/28/2017, PET CT 08/29/2017 FINDINGS: Cardiovascular: Non contrasted images of the chest demonstrate no intramural hematoma. Coronary vascular calcifications. Post CABG changes. No dissection is seen. Nonaneurysmal aorta. Normal heart size. No pericardial effusion. Mediastinum/Nodes: Midline trachea. No thyroid mass. Decreased size of right upper paratracheal lymph node now measuring 4 mm, compared with 10 mm previously. No new adenopathy. Fluid distention of the esophagus. Lungs/Pleura: No acute consolidation or pleural effusion. Significant decrease in size of the right upper lobe lung mass, residual soft tissue density in the region measuring 14 x 8 mm compared with previous measurements of 24 x 18 mm. Upper Abdomen: Hyperenhancement near the  gallbladder fossa. Vague hypodensity in the left hepatic lobe corresponding to metastatic focus better seen on the abdominopelvic CT. Musculoskeletal: Post sternotomy changes. No acute or suspicious abnormality Review of the MIP images confirms the above findings. IMPRESSION: 1. Negative for acute aortic dissection or aneurysm. 2. Significant decrease in size of right upper lobe lung mass and decreased mediastinal adenopathy compared to prior. 3. Diffuse fluid distension of the esophagus without esophageal wall thickening. 4. Known hepatic metastatic focus is better seen on the abdominopelvic CT Electronically Signed   By: Donavan Foil M.D.   On: 11/28/2017 23:34    EKG: Independently reviewed.  Assessment/Plan Principal Problem:   Epigastric pain Active Problems:   Coronary artery disease   S/P CABG x 1   Stage 4 lung cancer, right ([facility])    1. Epigastric pain - 1. CP obs pathway 2. Serial trops 3. Tele monitor 4. Will try GI cocktail 5. Dilaudid PRN pain 6. Call GI in AM and/or Cards in AM 2. CAD s/p CABG - cont ASA, plavix, lipitor. 3. NSCLC - tumors actually appear smaller than on prior imaging.  DVT prophylaxis: Lovenox Code Status: Full Family Communication: No family in room Disposition Plan: Home after admit Consults called: None Admission status: Place in obs   Alfred Eckley, [facility] Pager [phone]  If 7AM-7PM, please contact day team taking care of patient [email or web address] Password [email or web address]  11/29/2017, 12:51 AM

## 2017-11-29 NOTE — Progress Notes (Signed)
Post void residual 150 cc's on bladder scan.

## 2017-11-29 NOTE — Telephone Encounter (Signed)
Pt in hospital for abd pain . Lab appt cancelled.

## 2017-11-29 NOTE — Consult Note (Addendum)
Referring Provider: Dr. Jonelle Sidle, [facility] Primary Care Physician:  [name], MD Primary Gastroenterologist:  None, unassigned  Reason for Consultation:  Epigastric pain  IMPRESSION:  *Epigastric pain:  Sudden onset on 4/30.  ? Ulcer vs esophagitis vs esophageal spasm. *DAPT with Plavix and ASA due to CAD with CABG in 06/2017 *NSCLC:  Received radiation on cervical spine and hip boney mets.  Receiving chemo. *? Urinary retention from pain meds:  I spoke with patient's nurse about performing bladder scan and addressing with hospitalist, etc.  UA negative on admit.  PLAN: *Will allow clear liquids today, advised to try room temp drinks. *NPO after midnight for EGD on 5/2. *Continue daily PPI for now.   [name]. Zehr  11/29/2017, 4:07 PM    [facility] Attending   I have taken an interval history, reviewed the chart and examined the patient. I agree with the Advanced Practitioner's note, impression and recommendations.   Interscapular pain then gnawing epigastric pain that intensified. CT scan does show thickening of esophagus so reflux esophagitis very possible. Hx of heartburn x years though in retrospect much of that was probably angina. Since CABG he has rarely had heartburn and rare use of prn PPI  Candida esophagitis also possible given chemo and steroid use  Gallstones possible also ? Spinal issue but T spine neg on PET scan 08/2017   Plan EGD tomorrow The risks and benefits as well as alternatives of endoscopic procedure(s) have been discussed and reviewed. All questions answered. The patient agrees to proceed.  Gatha Mayer, MD, [facility] 11/29/2017 6:19 PM  HPI: Anthony [name] is a 47 y.o. male with medical history significant of CAD/NSTEMI Nov 2018 with 1 vessel CABG, NSCLC on chemo, immuno, and rad therapy.  Had radiation to neck/hip (boney mets).  Patient presented to the ED with c/o Epigastric abd pain that began suddenly around 5 [facility]  4/30.  Patient reports that initially he was feeling discomfort in his back, but has felt a gnawing, severe, dull pain in the epigastrium, slightly to the right that just progressed in severity. Patient reports it feels like he has a deep hunger. Patient denies any nausea or vomiting with this pain. Patient reports last bowel movement was on 4/30 and normal without melena or hematochezia. Patient denies fever or chills. Patient reporting that the only other time he had similar symptoms was when he had an esophageal spasm after extubation from his CABG.  He tells me that when he got radiation on his cervical spine it caused a lot of irritation to his throat but that has been much better since radiation has been finished.  No dysphagia.  He says that he's had reflux issues forever.  Has only been using Nexium OTC a few days per week.  No NSAID's.  Troponins were negative here.  CT angio chest as follows:  IMPRESSION: 1. Negative for acute aortic dissection or aneurysm. 2. Significant decrease in size of right upper lobe lung mass and decreased mediastinal adenopathy compared to prior. 3. Diffuse fluid distension of the esophagus without esophageal wall thickening. 4. Known hepatic metastatic focus is better seen on the abdominopelvic CT.  CT abdomen and pelvis also unremarkable for cause of symptoms.  Lipase normal.  Here he is on pantoprazole 40 mg daily.   Past Medical History:  Diagnosis Date  . Acid reflux   . History of radiation therapy 09/21/17-10/04/17   C4 spine 30 Gy in 10 fractions, left femur 30 Gy in  10 fractions  . Non-small cell lung cancer ([place])   . NSTEMI (non-ST elevated myocardial infarction) ([place]) 06/13/2017   [name] 06/14/2017  . Tailbone injury since 1993   "cracked"    Past Surgical History:  Procedure Laterality Date  . CARDIAC CATHETERIZATION  06/14/2017  . CORONARY ARTERY BYPASS GRAFT N/A 06/19/2017   Procedure: CORONARY ARTERY BYPASS GRAFTING (CABG), OFF PUMP,  times one using the left internal mammary artery to LAD;  Surgeon: Grace Isaac, MD;  Location: [place];  Service: Open Heart Surgery;  Laterality: N/A;  . LEFT HEART CATH AND CORONARY ANGIOGRAPHY N/A 06/14/2017   Procedure: LEFT HEART CATH AND CORONARY ANGIOGRAPHY;  Surgeon: Sherren Mocha, MD;  Location: [facility];  Service: Cardiovascular;  Laterality: N/A;  . TEE WITHOUT CARDIOVERSION N/A 06/19/2017   Procedure: TRANSESOPHAGEAL ECHOCARDIOGRAM (TEE);  Surgeon: Grace Isaac, MD;  Location: [facility] [facility];  Service: Open Heart Surgery;  Laterality: N/A;  . TONSILLECTOMY  ~ 1977    Prior to Admission medications   Medication Sig Start Date End Date Taking? Authorizing Provider  aspirin EC 81 MG EC tablet Take 1 tablet (81 mg total) by mouth daily. 06/23/17  Yes Barrett, Erin R, PA-C  atorvastatin (LIPITOR) 80 MG tablet TAKE 1 TABLET BY MOUTH DAILY AT 6 PM. 11/27/17  Yes Belva Crome, MD  cetirizine (ZYRTEC) 10 MG tablet Take 10 mg by mouth daily.   Yes [provider]  chlorproMAZINE (THORAZINE) 25 MG tablet Take 1 tablet (25 mg total) by mouth 3 (three) times daily as needed. 10/04/17  Yes [name], MD  clopidogrel (PLAVIX) 75 MG tablet TAKE 1 TABLET BY MOUTH EVERY DAY 11/27/17  Yes Belva Crome, MD  dexamethasone (DECADRON) 4 MG tablet Take 1 tablet twice a day the day before, day of, and day after each cycle of chemotherapy. 11/15/17  Yes [name], MD  esomeprazole (NEXIUM) 20 MG capsule Take 20 mg by mouth every other day.    Yes [provider]  folic acid (FOLVITE) 1 MG tablet Take 1 tablet (1 mg total) by mouth daily. 09/19/17  Yes Curcio, Roselie Awkward, NP  HYDROcodone-acetaminophen (NORCO/VICODIN) 5-325 MG tablet Take 1 tablet by mouth every 6 (six) hours as needed. for pain 11/15/17  Yes [provider]  lisinopril (PRINIVIL,ZESTRIL) 5 MG tablet Take 1 tablet (5 mg total) by mouth daily. 06/23/17  Yes Barrett, Erin R, PA-C  loratadine  (CLARITIN) 10 MG tablet Take 10 mg daily as needed by mouth for allergies.   Yes [provider]  metoprolol tartrate (LOPRESSOR) 25 MG tablet TAKE 1/2 TABLET BY MOUTH 2 TIMES DAILY. Patient taking differently: TAKE 1/2 TABLET  (12.5 mg) BY MOUTH 2 TIMES DAILY. 11/27/17  Yes Belva Crome, MD  prochlorperazine (COMPAZINE) 10 MG tablet Take 1 tablet (10 mg total) by mouth every 6 (six) hours as needed for nausea or vomiting. 09/19/17  Yes Curcio, Roselie Awkward, NP  sucralfate (CARAFATE) 1 g tablet Take 1 g by mouth daily. 11/07/17  Yes [provider]    Current Facility-Administered Medications  Medication Dose Route Frequency Provider Last Rate Last Dose  . 0.9 %  sodium chloride infusion   Intravenous Continuous Gala Romney L, MD      . acetaminophen (TYLENOL) tablet 650 mg  650 mg Oral Q4H PRN Etta Quill, DO      . aspirin EC tablet 81 mg  81 mg Oral Daily Jennette Kettle M, DO      .  atorvastatin (LIPITOR) tablet 80 mg  80 mg Oral q1800 Etta Quill, DO      . chlorproMAZINE (THORAZINE) tablet 25 mg  25 mg Oral TID PRN Etta Quill, DO      . clopidogrel (PLAVIX) tablet 75 mg  75 mg Oral Daily Alcario Drought, Jared M, DO      . enoxaparin (LOVENOX) injection 40 mg  40 mg Subcutaneous Q24H Alcario Drought, Jared M, DO   40 mg at 11/29/17 1012  . folic acid (FOLVITE) tablet 1 mg  1 mg Oral Daily Jennette Kettle M, DO      . gi cocktail (Maalox,Lidocaine,Donnatal)  30 mL Oral TID PRN Etta Quill, DO   30 mL at 11/29/17 0226  . HYDROmorphone (DILAUDID) injection 1 mg  1 mg Intravenous Q4H PRN Etta Quill, DO   1 mg at 11/29/17 1507  . iopamidol (ISOVUE-370) 76 % injection 100 mL  100 mL Intravenous Once PRN Valere Dross, Alyssa B, PA-C      . lisinopril (PRINIVIL,ZESTRIL) tablet 5 mg  5 mg Oral Daily Alcario Drought, Jared M, DO      . metoprolol tartrate (LOPRESSOR) tablet 12.5 mg  12.5 mg Oral BID Jennette Kettle M, DO   12.5 mg at 11/29/17 0158  . ondansetron (ZOFRAN) injection 4 mg   4 mg Intravenous Q6H PRN Etta Quill, DO      . pantoprazole (PROTONIX) EC tablet 40 mg  40 mg Oral Daily Alcario Drought, Jared M, DO      . prochlorperazine (COMPAZINE) tablet 10 mg  10 mg Oral Q6H PRN Etta Quill, DO      . sucralfate (CARAFATE) tablet 1 g  1 g Oral Daily Jennette Kettle M, DO        Allergies as of 11/28/2017  . (No Known Allergies)    Family History  Problem Relation Age of Onset  . Alcohol abuse Mother   . Heart disease Father   . Arrhythmia Father   . Aneurysm Maternal Grandmother     Social History   Social History Narrative   Divorced   Tool Salesman   + EtOH   Never smoker     Review of Systems: ROS is O/W negative except as mentioned in HPI.  Physical Exam: Vital signs in last 24 hours: Temp:  [98.1 F (36.7 C)-99.2 F (37.3 C)] 99.2 F (37.3 C) (05/01 1421) Pulse Rate:  [57-97] 93 (05/01 1421) Resp:  [12-30] 16 (05/01 1421) BP: (137-176)/(87-114) 137/87 (05/01 1421) SpO2:  [96 %-100 %] 96 % (05/01 1421) Weight:  [205 lb (93 kg)] 205 lb (93 kg) (04/30 1841) Last BM Date: 11/28/17 General:  Alert, Well-developed, well-nourished, pleasant and cooperative in NAD Head:  Normocephalic and atraumatic. Eyes:  Sclera clear, no icterus.  Conjunctiva pink. Ears:  Normal auditory acuity. Mouth:  No deformity or lesions.   Lungs:  Clear throughout to auscultation.   No wheezes, crackles, or rhonchi.  Heart:  Regular rate and rhythm; no murmurs, clicks, rubs,  or gallops. Abdomen:  Soft, non-distended.  BS present.  Minimal epigastric TTP. Msk:  Symmetrical without gross deformities. Pulses:  Normal pulses noted. Extremities:  Without clubbing or edema. Neurologic:  Alert and oriented x 4;  grossly normal neurologically. Skin:  Intact without significant lesions or rashes. Psych:  Alert and cooperative. Normal mood and affect.  I Lab Results: Recent Labs    11/28/17 1920  WBC 7.5  HGB 12.4*  HCT 35.4*  PLT 134*   BMET  Recent Labs     11/28/17 1920  NA 139  K 3.8  CL 102  CO2 28  GLUCOSE 102*  BUN 18  CREATININE 1.03  CALCIUM 9.4   LFT Recent Labs    11/28/17 1920  PROT 7.2  ALBUMIN 4.1  AST 28  ALT 67*  ALKPHOS 43  BILITOT 1.0   Studies/Results: Ct Abdomen Pelvis W Contrast  Result Date: 11/28/2017 CLINICAL DATA:  History of lung cancer. Acute generalized abdominal pain. EXAM: CT ABDOMEN AND PELVIS WITH CONTRAST TECHNIQUE: Multidetector CT imaging of the abdomen and pelvis was performed using the standard protocol following bolus administration of intravenous contrast. CONTRAST:  187mL ISOVUE-300 IOPAMIDOL (ISOVUE-300) INJECTION 61% COMPARISON:  MRI of September 04, 2017.  PET scan of August 29, 2017. FINDINGS: Lower chest: No acute abnormality. Hepatobiliary: 17 mm ill-defined low density is noted and central left hepatic lobe concerning for metastatic disease as described on prior PET scan and MRI. No gallstones are noted. No biliary dilatation is noted. Pancreas: Unremarkable. No pancreatic ductal dilatation or surrounding inflammatory changes. Spleen: Normal in size without focal abnormality. Adrenals/Urinary Tract: Adrenal glands are unremarkable. Kidneys are normal, without renal calculi, focal lesion, or hydronephrosis. Bladder is unremarkable. Stomach/Bowel: Stomach is within normal limits. Appendix appears normal. No evidence of bowel wall thickening, distention, or inflammatory changes. Sigmoid diverticulosis is noted without inflammation. Vascular/Lymphatic: No significant vascular findings are present. No enlarged abdominal or pelvic lymph nodes. Reproductive: Prostate is unremarkable. Other: No abdominal wall hernia or abnormality. No abdominopelvic ascites. Musculoskeletal: No acute or significant osseous findings. IMPRESSION: Grossly stable left hepatic lesion is noted consistent with metastatic disease. Sigmoid diverticulosis without inflammation. No other abnormality seen in the abdomen or pelvis.  Electronically Signed   By: Marijo Conception, M.D.   On: 11/28/2017 21:05   Dg Chest Portable 1 View  Result Date: 11/28/2017 CLINICAL DATA:  Severe mid abdominal pain for a few days. History of lung cancer. EXAM: PORTABLE CHEST 1 VIEW COMPARISON:  Head CT August 29, 2017 and chest radiograph August 03, 2016 FINDINGS: Cardiomediastinal silhouette is unremarkable for this low inspiratory examination with crowded vasculature markings. Status post median sternotomy. The lungs are clear without pleural effusions or focal consolidations. Trachea projects midline and there is no pneumothorax. Included soft tissue planes and osseous structures are non-suspicious. IMPRESSION: No active disease. Electronically Signed   By: Elon Alas M.D.   On: 11/28/2017 19:58   Ct Angio Chest Aorta W And/or Wo Contrast  Result Date: 11/28/2017 CLINICAL DATA:  History of lung cancer abdominal pain EXAM: CT ANGIOGRAPHY CHEST WITH CONTRAST TECHNIQUE: Multidetector CT imaging of the chest was performed using the standard protocol during bolus administration of intravenous contrast. Multiplanar CT image reconstructions and MIPs were obtained to evaluate the vascular anatomy. CONTRAST:  75 cc Isovue 370 intravenous COMPARISON:  CT abdomen pelvis 11/28/2017, PET CT 08/29/2017 FINDINGS: Cardiovascular: Non contrasted images of the chest demonstrate no intramural hematoma. Coronary vascular calcifications. Post CABG changes. No dissection is seen. Nonaneurysmal aorta. Normal heart size. No pericardial effusion. Mediastinum/Nodes: Midline trachea. No thyroid mass. Decreased size of right upper paratracheal lymph node now measuring 4 mm, compared with 10 mm previously. No new adenopathy. Fluid distention of the esophagus. Lungs/Pleura: No acute consolidation or pleural effusion. Significant decrease in size of the right upper lobe lung mass, residual soft tissue density in the region measuring 14 x 8 mm compared with previous  measurements of 24 x 18 mm. Upper Abdomen: Hyperenhancement near the  gallbladder fossa. Vague hypodensity in the left hepatic lobe corresponding to metastatic focus better seen on the abdominopelvic CT. Musculoskeletal: Post sternotomy changes. No acute or suspicious abnormality Review of the MIP images confirms the above findings. IMPRESSION: 1. Negative for acute aortic dissection or aneurysm. 2. Significant decrease in size of right upper lobe lung mass and decreased mediastinal adenopathy compared to prior. 3. Diffuse fluid distension of the esophagus without esophageal wall thickening. 4. Known hepatic metastatic focus is better seen on the abdominopelvic CT Electronically Signed   By: Donavan Foil M.D.   On: 11/28/2017 23:34

## 2017-11-29 NOTE — H&P (View-Only) (Signed)
Referring Provider: Dr. Jonelle Sidle, [facility] Primary Care Physician:  [name], MD Primary Gastroenterologist:  None, unassigned  Reason for Consultation:  Epigastric pain  IMPRESSION:  *Epigastric pain:  Sudden onset on 4/30.  ? Ulcer vs esophagitis vs esophageal spasm. *DAPT with Plavix and ASA due to CAD with CABG in 06/2017 *NSCLC:  Received radiation on cervical spine and hip boney mets.  Receiving chemo. *? Urinary retention from pain meds:  I spoke with patient's nurse about performing bladder scan and addressing with hospitalist, etc.  UA negative on admit.  PLAN: *Will allow clear liquids today, advised to try room temp drinks. *NPO after midnight for EGD on 5/2. *Continue daily PPI for now.   [name]. Zehr  11/29/2017, 4:07 PM     GI Attending   I have taken an interval history, reviewed the chart and examined the patient. I agree with the Advanced Practitioner's note, impression and recommendations.   Interscapular pain then gnawing epigastric pain that intensified. CT scan does show thickening of esophagus so reflux esophagitis very possible. Hx of heartburn x years though in retrospect much of that was probably angina. Since CABG he has rarely had heartburn and rare use of prn PPI  Candida esophagitis also possible given chemo and steroid use  Gallstones possible also ? Spinal issue but T spine neg on PET scan 08/2017   Plan EGD tomorrow The risks and benefits as well as alternatives of endoscopic procedure(s) have been discussed and reviewed. All questions answered. The patient agrees to proceed.  Gatha Mayer, MD, [facility] 11/29/2017 6:19 PM  HPI: Anthony [name] is a 47 y.o. male with medical history significant of CAD/NSTEMI Nov 2018 with 1 vessel CABG, NSCLC on chemo, immuno, and rad therapy.  Had radiation to neck/hip (boney mets).  Patient presented to the ED with c/o Epigastric abd pain that began suddenly around 5 [facility]  4/30.  Patient reports that initially he was feeling discomfort in his back, but has felt a gnawing, severe, dull pain in the epigastrium, slightly to the right that just progressed in severity. Patient reports it feels like he has a deep hunger. Patient denies any nausea or vomiting with this pain. Patient reports last bowel movement was on 4/30 and normal without melena or hematochezia. Patient denies fever or chills. Patient reporting that the only other time he had similar symptoms was when he had an esophageal spasm after extubation from his CABG.  He tells me that when he got radiation on his cervical spine it caused a lot of irritation to his throat but that has been much better since radiation has been finished.  No dysphagia.  He says that he's had reflux issues forever.  Has only been using Nexium OTC a few days per week.  No NSAID's.  Troponins were negative here.  CT angio chest as follows:  IMPRESSION: 1. Negative for acute aortic dissection or aneurysm. 2. Significant decrease in size of right upper lobe lung mass and decreased mediastinal adenopathy compared to prior. 3. Diffuse fluid distension of the esophagus without esophageal wall thickening. 4. Known hepatic metastatic focus is better seen on the abdominopelvic CT.  CT abdomen and pelvis also unremarkable for cause of symptoms.  Lipase normal.  Here he is on pantoprazole 40 mg daily.   Past Medical History:  Diagnosis Date  . Acid reflux   . History of radiation therapy 09/21/17-10/04/17   C4 spine 30 Gy in 10 fractions, left femur 30 Gy in  10 fractions  . Non-small cell lung cancer ([place])   . NSTEMI (non-ST elevated myocardial infarction) ([place]) 06/13/2017   [name] 06/14/2017  . Tailbone injury since 1993   "cracked"    Past Surgical History:  Procedure Laterality Date  . CARDIAC CATHETERIZATION  06/14/2017  . CORONARY ARTERY BYPASS GRAFT N/A 06/19/2017   Procedure: CORONARY ARTERY BYPASS GRAFTING (CABG), OFF PUMP,  times one using the left internal mammary artery to LAD;  Surgeon: Grace Isaac, MD;  Location: [place];  Service: Open Heart Surgery;  Laterality: N/A;  . LEFT HEART CATH AND CORONARY ANGIOGRAPHY N/A 06/14/2017   Procedure: LEFT HEART CATH AND CORONARY ANGIOGRAPHY;  Surgeon: Sherren Mocha, MD;  Location: [place] [facility];  Service: Cardiovascular;  Laterality: N/A;  . TEE WITHOUT CARDIOVERSION N/A 06/19/2017   Procedure: TRANSESOPHAGEAL ECHOCARDIOGRAM (TEE);  Surgeon: Grace Isaac, MD;  Location: [place];  Service: Open Heart Surgery;  Laterality: N/A;  . TONSILLECTOMY  ~ 1977    Prior to Admission medications   Medication Sig Start Date End Date Taking? Authorizing Provider  aspirin EC 81 MG EC tablet Take 1 tablet (81 mg total) by mouth daily. 06/23/17  Yes Barrett, Erin R, PA-C  atorvastatin (LIPITOR) 80 MG tablet TAKE 1 TABLET BY MOUTH DAILY AT 6 PM. 11/27/17  Yes Belva Crome, MD  cetirizine (ZYRTEC) 10 MG tablet Take 10 mg by mouth daily.   Yes [provider]  chlorproMAZINE (THORAZINE) 25 MG tablet Take 1 tablet (25 mg total) by mouth 3 (three) times daily as needed. 10/04/17  Yes [name], MD  clopidogrel (PLAVIX) 75 MG tablet TAKE 1 TABLET BY MOUTH EVERY DAY 11/27/17  Yes Belva Crome, MD  dexamethasone (DECADRON) 4 MG tablet Take 1 tablet twice a day the day before, day of, and day after each cycle of chemotherapy. 11/15/17  Yes [name], MD  esomeprazole (NEXIUM) 20 MG capsule Take 20 mg by mouth every other day.    Yes [provider]  folic acid (FOLVITE) 1 MG tablet Take 1 tablet (1 mg total) by mouth daily. 09/19/17  Yes Curcio, Roselie Awkward, NP  HYDROcodone-acetaminophen (NORCO/VICODIN) 5-325 MG tablet Take 1 tablet by mouth every 6 (six) hours as needed. for pain 11/15/17  Yes [provider]  lisinopril (PRINIVIL,ZESTRIL) 5 MG tablet Take 1 tablet (5 mg total) by mouth daily. 06/23/17  Yes Barrett, Erin R, PA-C  loratadine  (CLARITIN) 10 MG tablet Take 10 mg daily as needed by mouth for allergies.   Yes [provider]  metoprolol tartrate (LOPRESSOR) 25 MG tablet TAKE 1/2 TABLET BY MOUTH 2 TIMES DAILY. Patient taking differently: TAKE 1/2 TABLET  (12.5 mg) BY MOUTH 2 TIMES DAILY. 11/27/17  Yes Belva Crome, MD  prochlorperazine (COMPAZINE) 10 MG tablet Take 1 tablet (10 mg total) by mouth every 6 (six) hours as needed for nausea or vomiting. 09/19/17  Yes Curcio, Roselie Awkward, NP  sucralfate (CARAFATE) 1 g tablet Take 1 g by mouth daily. 11/07/17  Yes [provider]    Current Facility-Administered Medications  Medication Dose Route Frequency Provider Last Rate Last Dose  . 0.9 %  sodium chloride infusion   Intravenous Continuous Gala Romney L, MD      . acetaminophen (TYLENOL) tablet 650 mg  650 mg Oral Q4H PRN Etta Quill, DO      . aspirin EC tablet 81 mg  81 mg Oral Daily Jennette Kettle M, DO      .  atorvastatin (LIPITOR) tablet 80 mg  80 mg Oral q1800 Etta Quill, DO      . chlorproMAZINE (THORAZINE) tablet 25 mg  25 mg Oral TID PRN Etta Quill, DO      . clopidogrel (PLAVIX) tablet 75 mg  75 mg Oral Daily Alcario Drought, Jared M, DO      . enoxaparin (LOVENOX) injection 40 mg  40 mg Subcutaneous Q24H Alcario Drought, Jared M, DO   40 mg at 11/29/17 1012  . folic acid (FOLVITE) tablet 1 mg  1 mg Oral Daily Jennette Kettle M, DO      . gi cocktail (Maalox,Lidocaine,Donnatal)  30 mL Oral TID PRN Etta Quill, DO   30 mL at 11/29/17 0226  . HYDROmorphone (DILAUDID) injection 1 mg  1 mg Intravenous Q4H PRN Etta Quill, DO   1 mg at 11/29/17 1507  . iopamidol (ISOVUE-370) 76 % injection 100 mL  100 mL Intravenous Once PRN Valere Dross, Alyssa B, PA-C      . lisinopril (PRINIVIL,ZESTRIL) tablet 5 mg  5 mg Oral Daily Alcario Drought, Jared M, DO      . metoprolol tartrate (LOPRESSOR) tablet 12.5 mg  12.5 mg Oral BID Jennette Kettle M, DO   12.5 mg at 11/29/17 0158  . ondansetron (ZOFRAN) injection 4 mg   4 mg Intravenous Q6H PRN Etta Quill, DO      . pantoprazole (PROTONIX) EC tablet 40 mg  40 mg Oral Daily Alcario Drought, Jared M, DO      . prochlorperazine (COMPAZINE) tablet 10 mg  10 mg Oral Q6H PRN Etta Quill, DO      . sucralfate (CARAFATE) tablet 1 g  1 g Oral Daily Jennette Kettle M, DO        Allergies as of 11/28/2017  . (No Known Allergies)    Family History  Problem Relation Age of Onset  . Alcohol abuse Mother   . Heart disease Father   . Arrhythmia Father   . Aneurysm Maternal Grandmother     Social History   Social History Narrative   Divorced   Tool Salesman   + EtOH   Never smoker     Review of Systems: ROS is O/W negative except as mentioned in HPI.  Physical Exam: Vital signs in last 24 hours: Temp:  [98.1 F (36.7 C)-99.2 F (37.3 C)] 99.2 F (37.3 C) (05/01 1421) Pulse Rate:  [57-97] 93 (05/01 1421) Resp:  [12-30] 16 (05/01 1421) BP: (137-176)/(87-114) 137/87 (05/01 1421) SpO2:  [96 %-100 %] 96 % (05/01 1421) Weight:  [205 lb (93 kg)] 205 lb (93 kg) (04/30 1841) Last BM Date: 11/28/17 General:  Alert, Well-developed, well-nourished, pleasant and cooperative in NAD Head:  Normocephalic and atraumatic. Eyes:  Sclera clear, no icterus.  Conjunctiva pink. Ears:  Normal auditory acuity. Mouth:  No deformity or lesions.   Lungs:  Clear throughout to auscultation.   No wheezes, crackles, or rhonchi.  Heart:  Regular rate and rhythm; no murmurs, clicks, rubs,  or gallops. Abdomen:  Soft, non-distended.  BS present.  Minimal epigastric TTP. Msk:  Symmetrical without gross deformities. Pulses:  Normal pulses noted. Extremities:  Without clubbing or edema. Neurologic:  Alert and oriented x 4;  grossly normal neurologically. Skin:  Intact without significant lesions or rashes. Psych:  Alert and cooperative. Normal mood and affect.  I Lab Results: Recent Labs    11/28/17 1920  WBC 7.5  HGB 12.4*  HCT 35.4*  PLT 134*   BMET  Recent Labs     11/28/17 1920  NA 139  K 3.8  CL 102  CO2 28  GLUCOSE 102*  BUN 18  CREATININE 1.03  CALCIUM 9.4   LFT Recent Labs    11/28/17 1920  PROT 7.2  ALBUMIN 4.1  AST 28  ALT 67*  ALKPHOS 43  BILITOT 1.0   Studies/Results: Ct Abdomen Pelvis W Contrast  Result Date: 11/28/2017 CLINICAL DATA:  History of lung cancer. Acute generalized abdominal pain. EXAM: CT ABDOMEN AND PELVIS WITH CONTRAST TECHNIQUE: Multidetector CT imaging of the abdomen and pelvis was performed using the standard protocol following bolus administration of intravenous contrast. CONTRAST:  156mL ISOVUE-300 IOPAMIDOL (ISOVUE-300) INJECTION 61% COMPARISON:  MRI of September 04, 2017.  PET scan of August 29, 2017. FINDINGS: Lower chest: No acute abnormality. Hepatobiliary: 17 mm ill-defined low density is noted and central left hepatic lobe concerning for metastatic disease as described on prior PET scan and MRI. No gallstones are noted. No biliary dilatation is noted. Pancreas: Unremarkable. No pancreatic ductal dilatation or surrounding inflammatory changes. Spleen: Normal in size without focal abnormality. Adrenals/Urinary Tract: Adrenal glands are unremarkable. Kidneys are normal, without renal calculi, focal lesion, or hydronephrosis. Bladder is unremarkable. Stomach/Bowel: Stomach is within normal limits. Appendix appears normal. No evidence of bowel wall thickening, distention, or inflammatory changes. Sigmoid diverticulosis is noted without inflammation. Vascular/Lymphatic: No significant vascular findings are present. No enlarged abdominal or pelvic lymph nodes. Reproductive: Prostate is unremarkable. Other: No abdominal wall hernia or abnormality. No abdominopelvic ascites. Musculoskeletal: No acute or significant osseous findings. IMPRESSION: Grossly stable left hepatic lesion is noted consistent with metastatic disease. Sigmoid diverticulosis without inflammation. No other abnormality seen in the abdomen or pelvis.  Electronically Signed   By: Marijo Conception, M.D.   On: 11/28/2017 21:05   Dg Chest Portable 1 View  Result Date: 11/28/2017 CLINICAL DATA:  Severe mid abdominal pain for a few days. History of lung cancer. EXAM: PORTABLE CHEST 1 VIEW COMPARISON:  Head CT August 29, 2017 and chest radiograph August 03, 2016 FINDINGS: Cardiomediastinal silhouette is unremarkable for this low inspiratory examination with crowded vasculature markings. Status post median sternotomy. The lungs are clear without pleural effusions or focal consolidations. Trachea projects midline and there is no pneumothorax. Included soft tissue planes and osseous structures are non-suspicious. IMPRESSION: No active disease. Electronically Signed   By: Elon Alas M.D.   On: 11/28/2017 19:58   Ct Angio Chest Aorta W And/or Wo Contrast  Result Date: 11/28/2017 CLINICAL DATA:  History of lung cancer abdominal pain EXAM: CT ANGIOGRAPHY CHEST WITH CONTRAST TECHNIQUE: Multidetector CT imaging of the chest was performed using the standard protocol during bolus administration of intravenous contrast. Multiplanar CT image reconstructions and MIPs were obtained to evaluate the vascular anatomy. CONTRAST:  75 cc Isovue 370 intravenous COMPARISON:  CT abdomen pelvis 11/28/2017, PET CT 08/29/2017 FINDINGS: Cardiovascular: Non contrasted images of the chest demonstrate no intramural hematoma. Coronary vascular calcifications. Post CABG changes. No dissection is seen. Nonaneurysmal aorta. Normal heart size. No pericardial effusion. Mediastinum/Nodes: Midline trachea. No thyroid mass. Decreased size of right upper paratracheal lymph node now measuring 4 mm, compared with 10 mm previously. No new adenopathy. Fluid distention of the esophagus. Lungs/Pleura: No acute consolidation or pleural effusion. Significant decrease in size of the right upper lobe lung mass, residual soft tissue density in the region measuring 14 x 8 mm compared with previous  measurements of 24 x 18 mm. Upper Abdomen: Hyperenhancement near the  gallbladder fossa. Vague hypodensity in the left hepatic lobe corresponding to metastatic focus better seen on the abdominopelvic CT. Musculoskeletal: Post sternotomy changes. No acute or suspicious abnormality Review of the MIP images confirms the above findings. IMPRESSION: 1. Negative for acute aortic dissection or aneurysm. 2. Significant decrease in size of right upper lobe lung mass and decreased mediastinal adenopathy compared to prior. 3. Diffuse fluid distension of the esophagus without esophageal wall thickening. 4. Known hepatic metastatic focus is better seen on the abdominopelvic CT Electronically Signed   By: Donavan Foil M.D.   On: 11/28/2017 23:34

## 2017-11-30 ENCOUNTER — Ambulatory Visit: Payer: BLUE CROSS/BLUE SHIELD | Admitting: Interventional Cardiology

## 2017-11-30 ENCOUNTER — Encounter (HOSPITAL_COMMUNITY): Payer: Self-pay | Admitting: *Deleted

## 2017-11-30 ENCOUNTER — Observation Stay (HOSPITAL_COMMUNITY): Payer: BLUE CROSS/BLUE SHIELD | Admitting: Anesthesiology

## 2017-11-30 ENCOUNTER — Encounter (HOSPITAL_COMMUNITY)
Admission: EM | Disposition: A | Discharge status: Home / Self Care | Payer: Self-pay | Source: Home / Self Care | Attending: Emergency Medicine

## 2017-11-30 DIAGNOSIS — K449 Diaphragmatic hernia without obstruction or gangrene: Secondary | ICD-10-CM | POA: Diagnosis not present

## 2017-11-30 DIAGNOSIS — R1013 Epigastric pain: Secondary | ICD-10-CM | POA: Diagnosis not present

## 2017-11-30 DIAGNOSIS — K225 Diverticulum of esophagus, acquired: Secondary | ICD-10-CM | POA: Diagnosis not present

## 2017-11-30 DIAGNOSIS — Q396 Congenital diverticulum of esophagus: Secondary | ICD-10-CM

## 2017-11-30 HISTORY — PX: ESOPHAGOGASTRODUODENOSCOPY (EGD) WITH PROPOFOL: SHX5813

## 2017-11-30 SURGERY — ESOPHAGOGASTRODUODENOSCOPY (EGD) WITH PROPOFOL
Anesthesia: Monitor Anesthesia Care

## 2017-11-30 MED ORDER — LACTATED RINGERS IV SOLN
INTRAVENOUS | Status: DC
Start: 2017-11-30 — End: 2017-11-30
  Administered 2017-11-30: 1000 mL via INTRAVENOUS

## 2017-11-30 MED ORDER — SODIUM CHLORIDE 0.9 % IV SOLN: INTRAVENOUS | Status: DC

## 2017-11-30 MED ORDER — PROPOFOL 500 MG/50ML IV EMUL
INTRAVENOUS | Status: DC | PRN
  Administered 2017-11-30: 50 mg via INTRAVENOUS

## 2017-11-30 MED ORDER — ONDANSETRON HCL 4 MG/2ML IJ SOLN
INTRAMUSCULAR | Status: DC | PRN
  Administered 2017-11-30: 4 mg via INTRAVENOUS

## 2017-11-30 MED ORDER — PROPOFOL 10 MG/ML IV BOLUS
INTRAVENOUS | Status: AC
  Filled 2017-11-30: qty 40

## 2017-11-30 MED ORDER — PROPOFOL 500 MG/50ML IV EMUL
INTRAVENOUS | Status: DC | PRN
  Administered 2017-11-30: 150 ug/kg/min via INTRAVENOUS

## 2017-11-30 SURGICAL SUPPLY — 15 items

## 2017-11-30 NOTE — Transfer of Care (Signed)
Immediate Anesthesia Transfer of Care Note  Patient: Anthony [name]  Procedure(s) Performed: ESOPHAGOGASTRODUODENOSCOPY (EGD) WITH PROPOFOL (N/A )  Patient Location: PACU  Anesthesia Type:MAC  Level of Consciousness: awake, alert  and oriented  Airway & Oxygen Therapy: Patient Spontanous Breathing and Patient connected to nasal cannula oxygen  Post-op Assessment: Report given to RN and Post -op Vital signs reviewed and stable  Post vital signs: Reviewed and stable  Last Vitals:  Vitals Value Taken Time  BP    Temp    Pulse 80 11/30/2017  1:10 PM  Resp 19 11/30/2017  1:10 PM  SpO2 95 % 11/30/2017  1:10 PM  Vitals shown include unvalidated device data.  Last Pain:  Vitals:   11/30/17 1149  TempSrc: Oral  PainSc: 0-No pain      Patients Stated Pain Goal: 2 (89/38/10 1751)  Complications: No apparent anesthesia complications

## 2017-11-30 NOTE — Progress Notes (Signed)
Patient ID: Anthony [name], male   DOB: Sep 13, 1970, 47 y.o.   MRN: [id number]  PROGRESS NOTE    Anthony [name]  GGE:[id number] DOB: [birth year] DOA: 11/28/2017 PCP: [name], MD   Outpatient Specialists: Dr Weston Settle, Oncology    Brief Narrative:  Anthony [name] is a 47 y.o. male with medical history significant of CAD, NSTEMI Nov last year, 1 vessel CABG also in Nov; NSCLC on chemo, immuno, and rad therapy.  Patient presents to the ED with c/o Epigastric abd pain.  began suddenly this morning around 5 PMand patient initially reported feeling into his back, but has felt a gnawing, severe, dull pain in the epigastrium to right upper quadrant all day without relief. Patient reports it feels like he has a deep hunger. Patient denies any nausea or vomiting with this pain. Patient reports last bowel movement was today and normal without melena or hematochezia. Patient denies fever or chills. Patient reporting that the only other time he had similar symptoms was when he had an esophageal spasm after extubation from his CABG.    Assessment & Plan:   Principal Problem:   Epigastric pain Active Problems:   Coronary artery disease   S/P CABG x 1   Stage 4 lung cancer, right (HCC)   Esophageal diverticulum, acquired   1. Epigastric pain: Due to severe GERD. Appreciate GI input. S/P EGD. Follow GI recommendation  2. CAD: MI ruled out. Continue home regimen.  3. Hx of Lung Cancer. Continue follow up with Dr Earlie Server  4. Acquired esopahgeal Diverticulum: As in 1 above.   DVT prophylaxis: Lovenox Code Status: Full Family Communication: None at bedside Disposition Plan: Home  Consultants:   Dr Michelene Heady, GI  Procedures:  -EGD   Antimicrobials:  None  Subjective: Patient still has some epigastric pain. EGD completed  Objective: Vitals:   11/30/17 1311 11/30/17 1320 11/30/17 1402 11/30/17 2039  BP: (!) 96/49 (!) 116/52 123/82 120/74  Pulse: 76 71 75 89  Resp:  16 17 16 19   Temp:   98.4 F (36.9 C) 98.4 F (36.9 C)  TempSrc:   Oral Oral  SpO2: 98% 98% 100% 99%  Weight:      Height:        Intake/Output Summary (Last 24 hours) at 11/30/2017 2341 Last data filed at 11/30/2017 1500 Gross per 24 hour  Intake 2485 ml  Output -  Net 2485 ml   Filed Weights   11/28/17 1841 11/30/17 1149  Weight: 93 kg (205 lb) 93 kg (205 lb)    Examination:  General exam: Appears calm and comfortable  Respiratory system: Clear to auscultation. Respiratory effort normal. Cardiovascular system: S1 & S2 heard, RRR. No JVD, murmurs, rubs, gallops or clicks. No pedal edema. Gastrointestinal system: Abdomen is nondistended, soft and nontender. No organomegaly or masses felt. Normal bowel sounds heard. Central nervous system: Alert and oriented. No focal neurological deficits. Extremities: Symmetric 5 x 5 power. Skin: No rashes, lesions or ulcers Psychiatry: Judgement and insight appear normal. Mood & affect appropriate.     Data Reviewed: I have personally reviewed following labs and imaging studies  CBC: Recent Labs  Lab 11/28/17 1920  WBC 7.5  HGB 12.4*  HCT 35.4*  MCV 88.7  PLT 465*   Basic Metabolic Panel: Recent Labs  Lab 11/28/17 1920  NA 139  K 3.8  CL 102  CO2 28  GLUCOSE 102*  BUN 18  CREATININE 1.03  CALCIUM 9.4   GFR: Estimated Creatinine Clearance: 101.6  mL/min (by C-G formula based on SCr of 1.03 mg/dL). Liver Function Tests: Recent Labs  Lab 11/28/17 1920  AST 28  ALT 67*  ALKPHOS 43  BILITOT 1.0  PROT 7.2  ALBUMIN 4.1   Recent Labs  Lab 11/28/17 1920  LIPASE 32   No results for input(s): AMMONIA in the last 168 hours. Coagulation Profile: No results for input(s): INR, PROTIME in the last 168 hours. Cardiac Enzymes: Recent Labs  Lab 11/28/17 2130 11/29/17 0030 11/29/17 0343 11/29/17 0620 11/29/17 0901  TROPONINI <0.03 <0.03 <0.03 <0.03 <0.03   BNP (last 3 results) No results for input(s): PROBNP in the  last 8760 hours. HbA1C: No results for input(s): HGBA1C in the last 72 hours. CBG: No results for input(s): GLUCAP in the last 168 hours. Lipid Profile: No results for input(s): CHOL, HDL, LDLCALC, TRIG, CHOLHDL, LDLDIRECT in the last 72 hours. Thyroid Function Tests: No results for input(s): TSH, T4TOTAL, FREET4, T3FREE, THYROIDAB in the last 72 hours. Anemia Panel: No results for input(s): VITAMINB12, FOLATE, FERRITIN, TIBC, IRON, RETICCTPCT in the last 72 hours. Urine analysis:    Component Value Date/Time   COLORURINE YELLOW 11/28/2017 2155   APPEARANCEUR CLEAR 11/28/2017 2155   LABSPEC >1.046 (H) 11/28/2017 2155   PHURINE 8.0 11/28/2017 2155   GLUCOSEU NEGATIVE 11/28/2017 2155   HGBUR NEGATIVE 11/28/2017 2155   BILIRUBINUR NEGATIVE 11/28/2017 2155   KETONESUR NEGATIVE 11/28/2017 2155   PROTEINUR NEGATIVE 11/28/2017 2155   NITRITE NEGATIVE 11/28/2017 2155   LEUKOCYTESUR NEGATIVE 11/28/2017 2155   Sepsis Labs: @LABRCNTIP (procalcitonin:4,lacticidven:4)  )No results found for this or any previous visit (from the past 240 hour(s)).       Radiology Studies: No results found.      Scheduled Meds: . aspirin EC  81 mg Oral Daily  . atorvastatin  80 mg Oral q1800  . clopidogrel  75 mg Oral Daily  . enoxaparin (LOVENOX) injection  40 mg Subcutaneous Q24H  . folic acid  1 mg Oral Daily  . lisinopril  5 mg Oral Daily  . metoprolol tartrate  12.5 mg Oral BID  . pantoprazole  40 mg Oral Daily  . sucralfate  1 g Oral Daily   Continuous Infusions: . sodium chloride 100 mL/hr at 11/30/17 0302     LOS: 0 days    Time spent: 41 minutes    GARBA,LAWAL, MD [facility] Pager [phone]  If 7PM-7AM, please contact night-coverage [email or web address] Password [place] 11/30/2017, 11:41 PM

## 2017-11-30 NOTE — Interval H&P Note (Signed)
History and Physical Interval Note:  11/30/2017 12:44 PM  Anthony [name]  has presented today for surgery, with the diagnosis of Epigastric pain  The various methods of treatment have been discussed with the patient and family. After consideration of risks, benefits and other options for treatment, the patient has consented to  Procedure(s): ESOPHAGOGASTRODUODENOSCOPY (EGD) WITH PROPOFOL (N/A) as a surgical intervention .  The patient's history has been reviewed, patient examined, no change in status, stable for surgery.  I have reviewed the patient's chart and labs.  Questions were answered to the patient's satisfaction.     Silvano Rusk

## 2017-11-30 NOTE — Anesthesia Postprocedure Evaluation (Signed)
Anesthesia Post Note  Patient: Anthony [name]  Procedure(s) Performed: ESOPHAGOGASTRODUODENOSCOPY (EGD) WITH PROPOFOL (N/A )     Patient location during evaluation: PACU Anesthesia Type: MAC Level of consciousness: awake and alert Pain management: pain level controlled Vital Signs Assessment: post-procedure vital signs reviewed and stable Respiratory status: spontaneous breathing, nonlabored ventilation, respiratory function stable and patient connected to nasal cannula oxygen Cardiovascular status: stable and blood pressure returned to baseline Postop Assessment: no apparent nausea or vomiting Anesthetic complications: no    Last Vitals:  Vitals:   11/30/17 1320 11/30/17 1402  BP: (!) 116/52 123/82  Pulse: 71 75  Resp: 17 16  Temp:  36.9 C  SpO2: 98% 100%    Last Pain:  Vitals:   11/30/17 1402  TempSrc: Oral  PainSc:                  Razan Siler P Alistar Mcenery

## 2017-11-30 NOTE — Op Note (Signed)
[facility] Patient Name: Anthony [name] Procedure Date: 11/30/2017 MRN: [id number] Attending MD: Gatha Mayer , MD Date of Birth: 11/29/1970 CSN: [id number] Age: 47 Admit Type: Inpatient Procedure:                Upper GI endoscopy Indications:              Epigastric abdominal pain Providers:                Gatha Mayer, MD, Zenon Mayo, RN, Cherylynn Ridges, Technician, Rosario Adie, CRNA Referring MD:              Medicines:                Propofol per Anesthesia, Monitored Anesthesia Care Complications:            No immediate complications. Estimated Blood Loss:     Estimated blood loss: none. Procedure:                Pre-Anesthesia Assessment:                           - Prior to the procedure, a History and Physical                            was performed, and patient medications and                            allergies were reviewed. The patient's tolerance of                            previous anesthesia was also reviewed. The risks                            and benefits of the procedure and the sedation                            options and risks were discussed with the patient.                            All questions were answered, and informed consent                            was obtained. Prior Anticoagulants: The patient                            last took aspirin 1 day and Plavix (clopidogrel) 2                            days prior to the procedure. ASA Grade Assessment:                            III - A patient with severe systemic disease. After  reviewing the risks and benefits, the patient was                            deemed in satisfactory condition to undergo the                            procedure.                           After obtaining informed consent, the endoscope was                            passed under direct vision. Throughout the                             procedure, the patient's blood pressure, pulse, and                            oxygen saturations were monitored continuously. The                            Endoscope was introduced through the mouth, and                            advanced to the second part of duodenum. The upper                            GI endoscopy was accomplished without difficulty.                            The patient tolerated the procedure well. Scope In: Scope Out: Findings:      2 diverticula with small openings and no stigmata of recent bleeding       werefound in the distal esophagus.      Two benign-appearing, intrinsic mild stenoses were found at the       gastroesophageal junction. The narrowest stenosis measured 1.6 cm (inner       diameter). The stenoses were traversed.      A small hiatal hernia was present.      The exam was otherwise without abnormality.      The cardia and gastric fundus were normal on retroflexion. 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                           - Benign-appearing esophageal stenoses x 2                            distalesophagus.                           - Small hiatal hernia.                           - The examination was otherwise normal.                           -  No specimens collected. Moderate Sedation:      N/A- Per Anesthesia Care Recommendation:           - Return patient to hospital ward for possible                            discharge same day.                           - Cardiac diet.                           - Continue present medications.                           - Would have him take a daily PPI (pantoprazole 40                            mg qd) given long-term clopidogrel and ASA                           If he has more of these sxs then would consider a                            motility work-up and also possible prn NTG or                            anticholinergic directed at tx spasm of  esophagus. Procedure Code(s):        --- Professional ---                           [phone], Esophagogastroduodenoscopy, flexible,                            transoral; diagnostic, including collection of                            specimen(s) by brushing or washing, when performed                            (separate procedure) Diagnosis Code(s):        --- Professional ---                           Q39.6, Congenital diverticulum of esophagus                           K22.2, Esophageal obstruction                           K44.9, Diaphragmatic hernia without obstruction or                            gangrene                           R10.13, Epigastric pain CPT copyright 2017 American  Medical Association. All rights reserved. The codes documented in this report are preliminary and upon coder review may  be revised to meet current compliance requirements. Gatha Mayer, MD 11/30/2017 1:17:24 PM This report has been signed electronically. Number of Addenda: 0

## 2017-11-30 NOTE — Anesthesia Procedure Notes (Signed)
Date/Time: 11/30/2017 12:49 PM Performed by: Glory Buff, CRNA Oxygen Delivery Method: Nasal cannula

## 2017-11-30 NOTE — Anesthesia Preprocedure Evaluation (Addendum)
Anesthesia Evaluation  Patient identified by MRN, date of birth, ID band Patient awake    Reviewed: Allergy & Precautions, NPO status , Patient's Chart, lab work & pertinent test results  Airway Mallampati: III  TM Distance: >3 FB Neck ROM: Full    Dental no notable dental hx.    Pulmonary  Non-small cell lung cancer    Pulmonary exam normal breath sounds clear to auscultation       Cardiovascular hypertension, Pt. on medications and Pt. on home beta blockers + CAD, + Past MI and + CABG  Normal cardiovascular exam Rhythm:Regular Rate:Normal  ECG: SR, rate 16  Sees cardiologist   Neuro/Psych negative neurological ROS  negative psych ROS   GI/Hepatic Neg liver ROS, GERD  Medicated and Controlled,  Endo/Other  negative endocrine ROS  Renal/GU Renal disease     Musculoskeletal negative musculoskeletal ROS (+)   Abdominal   Peds  Hematology  (+) anemia , HLD   Anesthesia Other Findings Epigastric pain  Reproductive/Obstetrics                            Anesthesia Physical Anesthesia Plan  ASA: III  Anesthesia Plan: MAC   Post-op Pain Management:    Induction: Intravenous  PONV Risk Score and Plan: 1 and Propofol infusion and Treatment may vary due to age or medical condition  Airway Management Planned: Nasal Cannula  Additional Equipment:   Intra-op Plan:   Post-operative Plan:   Informed Consent: I have reviewed the patients History and Physical, chart, labs and discussed the procedure including the risks, benefits and alternatives for the proposed anesthesia with the patient or authorized representative who has indicated his/her understanding and acceptance.   Dental advisory given  Plan Discussed with: CRNA  Anesthesia Plan Comments:         Anesthesia Quick Evaluation

## 2017-12-01 ENCOUNTER — Observation Stay (HOSPITAL_COMMUNITY): Payer: BLUE CROSS/BLUE SHIELD

## 2017-12-01 ENCOUNTER — Telehealth: Payer: Self-pay | Admitting: Medical Oncology

## 2017-12-01 ENCOUNTER — Encounter (HOSPITAL_COMMUNITY): Payer: Self-pay | Admitting: Internal Medicine

## 2017-12-01 DIAGNOSIS — K225 Diverticulum of esophagus, acquired: Secondary | ICD-10-CM | POA: Diagnosis not present

## 2017-12-01 DIAGNOSIS — I2510 Atherosclerotic heart disease of native coronary artery without angina pectoris: Secondary | ICD-10-CM | POA: Diagnosis not present

## 2017-12-01 DIAGNOSIS — C3491 Malignant neoplasm of unspecified part of right bronchus or lung: Secondary | ICD-10-CM | POA: Diagnosis not present

## 2017-12-01 DIAGNOSIS — K449 Diaphragmatic hernia without obstruction or gangrene: Secondary | ICD-10-CM | POA: Diagnosis not present

## 2017-12-01 DIAGNOSIS — Q396 Congenital diverticulum of esophagus: Secondary | ICD-10-CM | POA: Diagnosis not present

## 2017-12-01 DIAGNOSIS — R1013 Epigastric pain: Secondary | ICD-10-CM | POA: Diagnosis not present

## 2017-12-01 LAB — CBC WITH DIFFERENTIAL/PLATELET
BASOS PCT: 0 %
Basophils Absolute: 0 10*3/uL (ref 0.0–0.1)
EOS ABS: 0 10*3/uL (ref 0.0–0.7)
Eosinophils Relative: 1 %
HEMATOCRIT: 28.4 % — AB (ref 39.0–52.0)
HEMOGLOBIN: 9.9 g/dL — AB (ref 13.0–17.0)
Lymphocytes Relative: 13 %
Lymphs Abs: 0.5 10*3/uL — ABNORMAL LOW (ref 0.7–4.0)
MCH: 31.7 pg (ref 26.0–34.0)
MCHC: 34.9 g/dL (ref 30.0–36.0)
MCV: 91 fL (ref 78.0–100.0)
MONO ABS: 0.7 10*3/uL (ref 0.1–1.0)
Monocytes Relative: 17 %
NEUTROS ABS: 2.8 10*3/uL (ref 1.7–7.7)
NEUTROS PCT: 69 %
Platelets: 108 10*3/uL — ABNORMAL LOW (ref 150–400)
RBC: 3.12 MIL/uL — ABNORMAL LOW (ref 4.22–5.81)
RDW: 15.7 % — AB (ref 11.5–15.5)
WBC: 4.1 10*3/uL (ref 4.0–10.5)

## 2017-12-01 LAB — BASIC METABOLIC PANEL
ANION GAP: 9 (ref 5–15)
BUN: 11 mg/dL (ref 6–20)
CALCIUM: 8.8 mg/dL — AB (ref 8.9–10.3)
CO2: 27 mmol/L (ref 22–32)
Chloride: 103 mmol/L (ref 101–111)
Creatinine, Ser: 0.79 mg/dL (ref 0.61–1.24)
GFR calc Af Amer: >60 mL/min (ref >60)
Glucose, Bld: 109 mg/dL — ABNORMAL HIGH (ref 65–99)
POTASSIUM: 4 mmol/L (ref 3.5–5.1)
Sodium: 139 mmol/L (ref 135–145)

## 2017-12-01 MED ORDER — GI COCKTAIL ~~LOC~~: 30.0000 mL | Freq: Three times a day (TID) | ORAL | 0 refills | Status: DC | PRN

## 2017-12-01 MED ORDER — HYDROCODONE-ACETAMINOPHEN 5-325 MG PO TABS: 1.0000 | ORAL_TABLET | Freq: Four times a day (QID) | ORAL | 0 refills | Status: AC | PRN

## 2017-12-01 MED ORDER — PANTOPRAZOLE SODIUM 40 MG PO TBEC: 40.0000 mg | DELAYED_RELEASE_TABLET | Freq: Every day | ORAL | 0 refills | Status: DC

## 2017-12-01 NOTE — Discharge Summary (Signed)
Physician Discharge Summary  Anthony [name] OZH:[id number] DOB: [birth year] DOA: 11/28/2017  PCP: Anthony Cha, MD  Admit date: 11/28/2017 Discharge date: 12/01/2017  Time spent: 60 minutes  Recommendations for Outpatient Follow-up:  1. Follow-up with Anthony Cha, MD in 2 weeks.  If patient with continued epigastric and right upper quadrant abdominal pain likely need outpatient referral to general surgery for evaluation for possible symptomatic cholelithiasis.   Discharge Diagnoses:  Principal Problem:   Epigastric pain Active Problems:   Coronary artery disease   S/P CABG x 1   Stage 4 lung cancer, right (HCC)   Esophageal diverticulum, acquired   Discharge Condition: Stable and improved  Diet recommendation: Heart healthy  Filed Weights   11/28/17 1841 11/30/17 1149  Weight: 93 kg (205 lb) 93 kg (205 lb)    History of present illness:  Per Dr. Vashti Hey [name] is a 47 y.o. male with medical history significant of CAD, NSTEMI Nov last year, 1 vessel CABG also in Nov; NSCLC on chemo, immuno, and rad therapy.  Patient presents to the ED with c/o Epigastric abd pain.  began suddenly this morning around 5 PMand patient initially reported feeling into his back, but has felt a gnawing, severe, dull pain in the epigastrium to right upper quadrant all day without relief. Patient reports it feels like he has a deep hunger. Patient denies any nausea or vomiting with this pain. Patient reports last bowel movement was today and normal without melena or hematochezia. Patient denies fever or chills. Patient reporting that the only other time he had similar symptoms was when he had an esophageal spasm after extubation from his CABG.   ED Course: Pain controlled after multiple rounds of IV dilaudid, trop neg, EKG unchanged.  CT neg for dissection or large PE.  Hospitalist asked to admit for obs.    Hospital Course:  1 acute epigastric pain Patient was admitted with  acute epigastric pain and right upper quadrant pain that started off as a pain in his back with a gnawing sensation, severe and all and subsequently to the epigastrium the right upper quadrant without any relief.  Patient was admitted to the hospital for further evaluation.  CT angiogram chest which was done was negative for aortic dissection or large PE.  Cardiac enzymes were cycled which were negative x3.  Patient placed on a PPI as well as a GI cocktail as needed and GI consulted.  Patient was seen in consultation by gastroenterology, Dr.Gessner and patient subsequently underwent upper endoscopy which noted diverticula x2 in the distal esophagus, benign-appearing esophageal stenosis x2 in the distal esophagus, small hiatal hernia otherwise normal examination.  Patient was subsequently placed on a diet.  Patient had some abdominal pain partially relieved with a GI cocktail.  Patient also noted to have right upper quadrant abdominal pain and as such abdominal ultrasound was done.  Abdominal ultrasound which was done did have cholelithiasis without an acute cholecystitis.  It was felt patient was stable to be discharged home on a PPI daily.  Patient will need outpatient follow-up with his PCP in 2 weeks.  On follow-up if patient with continued pain may consider referral to general surgery in the outpatient setting for possible evaluation for symptomatic cholelithiasis.  Patient be discharged home in stable and improved condition.  2.  Coronary artery disease status post CABG Remained stable.  Cardiac enzymes cycled were negative x3.  KG which was done showed normal sinus rhythm with no signs of ischemia.  She was maintained  on aspirin, Plavix, Lipitor, lisinopril, Lopressor.  Outpatient follow-up.  3.  Hx of lung cancer Remained stable.  Patient had CT of his chest and abdomen and pelvis done during the hospitalization.  Outpatient follow-up with oncology.     Procedures:  RUQ [place]  12/01/2017--cholelithiasis without evidence of cholecystitis.  Upper endoscopy 11/30/2017 per Dr. Carlean Purl  Chest x-ray 11/28/2017  CT angiogram chest 11/28/2017  CT abdomen and pelvis 11/28/2017  Consultations:  Gastroenterology: Dr. Carlean Purl 11/29/2017  Discharge Exam: Vitals:   12/01/17 0552 12/01/17 1343  BP: 116/80 119/71  Pulse: 77 64  Resp: 19 20  Temp: 98.3 F (36.8 C) 98.1 F (36.7 C)  SpO2: 99% 100%    General: NAD Cardiovascular: RRR Respiratory: CTAB  Discharge Instructions   Discharge Instructions    Diet - low sodium heart healthy   Complete by:  As directed    Increase activity slowly   Complete by:  As directed      Allergies as of 12/01/2017   No Known Allergies     Medication List    STOP taking these medications   dexamethasone 4 MG tablet Commonly known as:  DECADRON   esomeprazole 20 MG capsule Commonly known as:  NEXIUM     TAKE these medications   aspirin 81 MG EC tablet Take 1 tablet (81 mg total) by mouth daily.   atorvastatin 80 MG tablet Commonly known as:  LIPITOR TAKE 1 TABLET BY MOUTH DAILY AT 6 PM.   cetirizine 10 MG tablet Commonly known as:  ZYRTEC Take 10 mg by mouth daily.   chlorproMAZINE 25 MG tablet Commonly known as:  THORAZINE Take 1 tablet (25 mg total) by mouth 3 (three) times daily as needed.   clopidogrel 75 MG tablet Commonly known as:  PLAVIX TAKE 1 TABLET BY MOUTH EVERY DAY   folic acid 1 MG tablet Commonly known as:  FOLVITE Take 1 tablet (1 mg total) by mouth daily.   gi cocktail Susp suspension Take 30 mLs by mouth 3 (three) times daily as needed for indigestion. Shake well.   HYDROcodone-acetaminophen 5-325 MG tablet Commonly known as:  NORCO/VICODIN Take 1 tablet by mouth every 6 (six) hours as needed for up to 5 days. for pain   lisinopril 5 MG tablet Commonly known as:  PRINIVIL,ZESTRIL Take 1 tablet (5 mg total) by mouth daily.   loratadine 10 MG tablet Commonly known as:   CLARITIN Take 10 mg daily as needed by mouth for allergies.   metoprolol tartrate 25 MG tablet Commonly known as:  LOPRESSOR TAKE 1/2 TABLET BY MOUTH 2 TIMES DAILY. What changed:  See the new instructions.   pantoprazole 40 MG tablet Commonly known as:  PROTONIX Take 1 tablet (40 mg total) by mouth daily. Start taking on:  12/02/2017   prochlorperazine 10 MG tablet Commonly known as:  COMPAZINE Take 1 tablet (10 mg total) by mouth every 6 (six) hours as needed for nausea or vomiting.   sucralfate 1 g tablet Commonly known as:  CARAFATE Take 1 g by mouth daily.      No Known Allergies Follow-up Information    Anthony Cha, MD. Schedule an appointment as soon as possible for a visit in 2 week(s).   Specialty:  Internal Medicine Contact information: [address] [place] [address] [phone]            The results of significant diagnostics from this hospitalization (including imaging, microbiology, ancillary and laboratory) are listed below  for reference.    Significant Diagnostic Studies: Ct Abdomen Pelvis W Contrast  Result Date: 11/28/2017 CLINICAL DATA:  History of lung cancer. Acute generalized abdominal pain. EXAM: CT ABDOMEN AND PELVIS WITH CONTRAST TECHNIQUE: Multidetector CT imaging of the abdomen and pelvis was performed using the standard protocol following bolus administration of intravenous contrast. CONTRAST:  171mL ISOVUE-300 IOPAMIDOL (ISOVUE-300) INJECTION 61% COMPARISON:  MRI of September 04, 2017.  PET scan of August 29, 2017. FINDINGS: Lower chest: No acute abnormality. Hepatobiliary: 17 mm ill-defined low density is noted and central left hepatic lobe concerning for metastatic disease as described on prior PET scan and MRI. No gallstones are noted. No biliary dilatation is noted. Pancreas: Unremarkable. No pancreatic ductal dilatation or surrounding inflammatory changes. Spleen: Normal in size without focal abnormality.  Adrenals/Urinary Tract: Adrenal glands are unremarkable. Kidneys are normal, without renal calculi, focal lesion, or hydronephrosis. Bladder is unremarkable. Stomach/Bowel: Stomach is within normal limits. Appendix appears normal. No evidence of bowel wall thickening, distention, or inflammatory changes. Sigmoid diverticulosis is noted without inflammation. Vascular/Lymphatic: No significant vascular findings are present. No enlarged abdominal or pelvic lymph nodes. Reproductive: Prostate is unremarkable. Other: No abdominal wall hernia or abnormality. No abdominopelvic ascites. Musculoskeletal: No acute or significant osseous findings. IMPRESSION: Grossly stable left hepatic lesion is noted consistent with metastatic disease. Sigmoid diverticulosis without inflammation. No other abnormality seen in the abdomen or pelvis. Electronically Signed   By: Marijo Conception, M.D.   On: 11/28/2017 21:05   Dg Chest Portable 1 View  Result Date: 11/28/2017 CLINICAL DATA:  Severe mid abdominal pain for a few days. History of lung cancer. EXAM: PORTABLE CHEST 1 VIEW COMPARISON:  Head CT August 29, 2017 and chest radiograph August 03, 2016 FINDINGS: Cardiomediastinal silhouette is unremarkable for this low inspiratory examination with crowded vasculature markings. Status post median sternotomy. The lungs are clear without pleural effusions or focal consolidations. Trachea projects midline and there is no pneumothorax. Included soft tissue planes and osseous structures are non-suspicious. IMPRESSION: No active disease. Electronically Signed   By: Elon Alas M.D.   On: 11/28/2017 19:58   Ct Angio Chest Aorta W And/or Wo Contrast  Result Date: 11/28/2017 CLINICAL DATA:  History of lung cancer abdominal pain EXAM: CT ANGIOGRAPHY CHEST WITH CONTRAST TECHNIQUE: Multidetector CT imaging of the chest was performed using the standard protocol during bolus administration of intravenous contrast. Multiplanar CT image  reconstructions and MIPs were obtained to evaluate the vascular anatomy. CONTRAST:  75 cc Isovue 370 intravenous COMPARISON:  CT abdomen pelvis 11/28/2017, PET CT 08/29/2017 FINDINGS: Cardiovascular: Non contrasted images of the chest demonstrate no intramural hematoma. Coronary vascular calcifications. Post CABG changes. No dissection is seen. Nonaneurysmal aorta. Normal heart size. No pericardial effusion. Mediastinum/Nodes: Midline trachea. No thyroid mass. Decreased size of right upper paratracheal lymph node now measuring 4 mm, compared with 10 mm previously. No new adenopathy. Fluid distention of the esophagus. Lungs/Pleura: No acute consolidation or pleural effusion. Significant decrease in size of the right upper lobe lung mass, residual soft tissue density in the region measuring 14 x 8 mm compared with previous measurements of 24 x 18 mm. Upper Abdomen: Hyperenhancement near the gallbladder fossa. Vague hypodensity in the left hepatic lobe corresponding to metastatic focus better seen on the abdominopelvic CT. Musculoskeletal: Post sternotomy changes. No acute or suspicious abnormality Review of the MIP images confirms the above findings. IMPRESSION: 1. Negative for acute aortic dissection or aneurysm. 2. Significant decrease in size of right upper lobe  lung mass and decreased mediastinal adenopathy compared to prior. 3. Diffuse fluid distension of the esophagus without esophageal wall thickening. 4. Known hepatic metastatic focus is better seen on the abdominopelvic CT Electronically Signed   By: Donavan Foil M.D.   On: 11/28/2017 23:34   US Abdomen Limited Ruq  Result Date: 12/01/2017 CLINICAL DATA:  Acute right upper quadrant abdominal pain. EXAM: ULTRASOUND ABDOMEN LIMITED RIGHT UPPER QUADRANT COMPARISON:  CT scan of November 28, 2017. FINDINGS: Gallbladder: 1.5 cm gallstone is noted with probable sludge. No gallbladder wall thickening or pericholecystic fluid is noted. No sonographic Murphy's sign is  noted. Common bile duct: Diameter: 5 mm which is within normal limits. Liver: No focal lesion identified. The left hepatic abnormality seen on prior CT scan is not well visualized here. Within normal limits in parenchymal echogenicity. Portal vein is patent on color Doppler imaging with normal direction of blood flow towards the liver. IMPRESSION: Cholelithiasis without evidence of cholecystitis. Left hepatic lesion seen on prior CT scan is not visualized on this exam. Electronically Signed   By: Marijo Conception, M.D.   On: 12/01/2017 15:43    Microbiology: No results found for this or any previous visit (from the past 240 hour(s)).   Labs: Basic Metabolic Panel: Recent Labs  Lab 11/28/17 1920 12/01/17 0937  NA 139 139  K 3.8 4.0  CL 102 103  CO2 28 27  GLUCOSE 102* 109*  BUN 18 11  CREATININE 1.03 0.79  CALCIUM 9.4 8.8*   Liver Function Tests: Recent Labs  Lab 11/28/17 1920  AST 28  ALT 67*  ALKPHOS 43  BILITOT 1.0  PROT 7.2  ALBUMIN 4.1   Recent Labs  Lab 11/28/17 1920  LIPASE 32   No results for input(s): AMMONIA in the last 168 hours. CBC: Recent Labs  Lab 11/28/17 1920 12/01/17 0937  WBC 7.5 4.1  NEUTROABS  --  2.8  HGB 12.4* 9.9*  HCT 35.4* 28.4*  MCV 88.7 91.0  PLT 134* 108*   Cardiac Enzymes: Recent Labs  Lab 11/28/17 2130 11/29/17 0030 11/29/17 0343 11/29/17 0620 11/29/17 0901  TROPONINI <0.03 <0.03 <0.03 <0.03 <0.03   BNP: BNP (last 3 results) No results for input(s): BNP in the last 8760 hours.  ProBNP (last 3 results) No results for input(s): PROBNP in the last 8760 hours.  CBG: No results for input(s): GLUCAP in the last 168 hours.     Signed:  Irine Seal MD.  [facility] 12/01/2017, 4:34 PM

## 2017-12-01 NOTE — Progress Notes (Addendum)
     [facility] Progress Note  CC:  Epigastric pain  Subjective:  Feeling ok.  Still has pain, and it seemed to worsen somewhat after eating dinner last night, but tolerated it ok and tolerated breakfast this AM.  EGD on 5/2 showed the following:  - Diverticula x 2 in the distal esophagus. - Benign-appearing esophageal stenoses x 2 distalesophagus. - Small hiatal hernia. - The examination was otherwise normal. - No specimens collected.  Labs from this AM are pending.  Objective:  Vital signs in last 24 hours: Temp:  [98 F (36.7 C)-98.4 F (36.9 C)] 98.3 F (36.8 C) (05/03 0552) Pulse Rate:  [71-89] 77 (05/03 0552) Resp:  [14-19] 19 (05/03 0552) BP: (96-126)/(49-82) 116/80 (05/03 0552) SpO2:  [98 %-100 %] 99 % (05/03 0552) Weight:  [205 lb (93 kg)] 205 lb (93 kg) (05/02 1149) Last BM Date: 11/28/17 General:  Alert, Well-developed, in NAD Heart:  Regular rate and rhythm; no murmurs Pulm:  CTAB.  No increased WOB. Abdomen:  Soft, non-distended.  BS present.  Mild epigastric TTP. Extremities:  Without edema. Neurologic:  Alert and oriented x 4;  grossly normal neurologically. Psych:  Alert and cooperative. Normal mood and affect.  Intake/Output from previous day: 05/02 0701 - 05/03 0700 In: 1343.3 [I.V.:1343.3] Out: -     Assessment / Plan: *Epigastric pain:  Sudden onset on 4/30.  ? esophageal spasm vs gallbladder etiology. *DAPT with Plavix and ASA due to CAD with CABG in 06/2017 *NSCLC:  Received radiation on cervical spine and hip boney mets.  Receiving chemo.  -Needs to be taking pantoprazole 40 mg daily indefinitely.  Will give that some time to see how much that helps. -Will schedule outpatient RUQ ultrasound to better evaluate the gallbladder.   -Pending results and response to PPI then may need to consider motility/manometry study or HIDA scan in the future as well. -Ok for discharge today from GI standpoint.   LOS: 0 days   [name]. Zehr   12/01/2017, 9:15 AM    [facility] Attending   I have taken an interval history, reviewed the chart and examined the patient. I agree with the Advanced Practitioner's note, impression and recommendations.    Still not sure if this is esophageal vs other Seems to have at least some GERD Will check Abd [place] ? Gallstones - will actually do this PM  Discussed in person with Dr. Claretta Fraise, MD, [facility] 12/01/2017 11:29 AM

## 2017-12-01 NOTE — Telephone Encounter (Signed)
Asking to cancel Ct scans -just had them done.CT scans cancelled.

## 2017-12-01 NOTE — Care Management Note (Signed)
Case Management Note  Patient Details  Name: Anthony [name] MRN: [id number] Date of Birth: 07/26/1971  Subjective/Objective:                  Admitted with abd. pain  Action/Plan: Date: Dec 01, 2017 Velva Harman, BSN, [place], [place] Chart and notes review for patient progress and needs. Will follow for case management and discharge needs. No cm or discharge needs present at time of this review. Next review date: [id number]  Expected Discharge Date:                  Expected Discharge Plan:  Home/Self Care  In-House Referral:     Discharge planning Services  CM Consult  Post Acute Care Choice:    Choice offered to:     DME Arranged:    DME Agency:     HH Arranged:    HH Agency:     Status of Service:  In process, will continue to follow  If discussed at Long Length of Stay Meetings, dates discussed:    Additional Comments:  [name], RN 12/01/2017, 9:54 AM

## 2017-12-04 ENCOUNTER — Telehealth: Payer: Self-pay | Admitting: *Deleted

## 2017-12-04 ENCOUNTER — Ambulatory Visit (HOSPITAL_COMMUNITY): Payer: BLUE CROSS/BLUE SHIELD

## 2017-12-04 NOTE — Telephone Encounter (Signed)
Pt calling with the following concerns. 1. Recent visit to ED due to pain. Pt received [place] of gallbladder found to have gall stones, GI scope done.Pt was given a rx for protonix "to help get rid of gallstones." 2. Pt has had constipation x 3 days took laxative and has few concerns about treatment with recent ED visit. Discussed with pt MD will be made aware of his recent ED visit and concerns. NO further concerns.

## 2017-12-06 ENCOUNTER — Encounter: Payer: Self-pay | Admitting: Internal Medicine

## 2017-12-06 ENCOUNTER — Inpatient Hospital Stay (HOSPITAL_BASED_OUTPATIENT_CLINIC_OR_DEPARTMENT_OTHER): Payer: BLUE CROSS/BLUE SHIELD | Admitting: Internal Medicine

## 2017-12-06 ENCOUNTER — Inpatient Hospital Stay: Payer: BLUE CROSS/BLUE SHIELD | Attending: Internal Medicine

## 2017-12-06 ENCOUNTER — Inpatient Hospital Stay: Payer: BLUE CROSS/BLUE SHIELD

## 2017-12-06 ENCOUNTER — Telehealth: Payer: Self-pay | Admitting: Internal Medicine

## 2017-12-06 VITALS — BP 131/93 | HR 72 | Temp 98.5°F | Resp 18 | Ht 70.0 in | Wt 197.8 lb

## 2017-12-06 DIAGNOSIS — Z923 Personal history of irradiation: Secondary | ICD-10-CM

## 2017-12-06 DIAGNOSIS — K219 Gastro-esophageal reflux disease without esophagitis: Secondary | ICD-10-CM | POA: Diagnosis not present

## 2017-12-06 DIAGNOSIS — Z7689 Persons encountering health services in other specified circumstances: Secondary | ICD-10-CM | POA: Diagnosis not present

## 2017-12-06 DIAGNOSIS — R599 Enlarged lymph nodes, unspecified: Secondary | ICD-10-CM

## 2017-12-06 DIAGNOSIS — C3411 Malignant neoplasm of upper lobe, right bronchus or lung: Secondary | ICD-10-CM | POA: Insufficient documentation

## 2017-12-06 DIAGNOSIS — Z5111 Encounter for antineoplastic chemotherapy: Secondary | ICD-10-CM | POA: Diagnosis not present

## 2017-12-06 DIAGNOSIS — I10 Essential (primary) hypertension: Secondary | ICD-10-CM

## 2017-12-06 DIAGNOSIS — I252 Old myocardial infarction: Secondary | ICD-10-CM | POA: Insufficient documentation

## 2017-12-06 DIAGNOSIS — C787 Secondary malignant neoplasm of liver and intrahepatic bile duct: Secondary | ICD-10-CM

## 2017-12-06 DIAGNOSIS — K5730 Diverticulosis of large intestine without perforation or abscess without bleeding: Secondary | ICD-10-CM | POA: Diagnosis not present

## 2017-12-06 DIAGNOSIS — Z5112 Encounter for antineoplastic immunotherapy: Secondary | ICD-10-CM

## 2017-12-06 DIAGNOSIS — Z7982 Long term (current) use of aspirin: Secondary | ICD-10-CM

## 2017-12-06 DIAGNOSIS — C7951 Secondary malignant neoplasm of bone: Secondary | ICD-10-CM | POA: Diagnosis not present

## 2017-12-06 DIAGNOSIS — Z79899 Other long term (current) drug therapy: Secondary | ICD-10-CM

## 2017-12-06 DIAGNOSIS — C3491 Malignant neoplasm of unspecified part of right bronchus or lung: Secondary | ICD-10-CM

## 2017-12-06 DIAGNOSIS — K8020 Calculus of gallbladder without cholecystitis without obstruction: Secondary | ICD-10-CM

## 2017-12-06 LAB — CMP (CANCER CENTER ONLY)
ALT: 21 U/L (ref 0–55)
AST: 16 U/L (ref 5–34)
Albumin: 3.3 g/dL — ABNORMAL LOW (ref 3.5–5.0)
Alkaline Phosphatase: 63 U/L (ref 40–150)
Anion gap: 9 (ref 3–11)
BUN: 14 mg/dL (ref 7–26)
CHLORIDE: 104 mmol/L (ref 98–109)
CO2: 26 mmol/L (ref 22–29)
CREATININE: 0.88 mg/dL (ref 0.70–1.30)
Calcium: 10.1 mg/dL (ref 8.4–10.4)
Glucose, Bld: 101 mg/dL (ref 70–140)
Potassium: 3.9 mmol/L (ref 3.5–5.1)
SODIUM: 139 mmol/L (ref 136–145)
Total Bilirubin: 0.3 mg/dL (ref 0.2–1.2)
Total Protein: 7.4 g/dL (ref 6.4–8.3)

## 2017-12-06 LAB — CBC WITH DIFFERENTIAL (CANCER CENTER ONLY)
BASOS PCT: 1 %
Basophils Absolute: 0 10*3/uL (ref 0.0–0.1)
EOS ABS: 0 10*3/uL (ref 0.0–0.5)
Eosinophils Relative: 1 %
HEMATOCRIT: 31.7 % — AB (ref 38.4–49.9)
HEMOGLOBIN: 11.2 g/dL — AB (ref 13.0–17.1)
LYMPHS ABS: 0.8 10*3/uL — AB (ref 0.9–3.3)
Lymphocytes Relative: 23 %
MCH: 31.8 pg (ref 27.2–33.4)
MCHC: 35.2 g/dL (ref 32.0–36.0)
MCV: 90.2 fL (ref 79.3–98.0)
MONOS PCT: 13 %
Monocytes Absolute: 0.5 10*3/uL (ref 0.1–0.9)
NEUTROS ABS: 2.4 10*3/uL (ref 1.5–6.5)
NEUTROS PCT: 62 %
Platelet Count: 357 10*3/uL (ref 140–400)
RBC: 3.51 MIL/uL — AB (ref 4.20–5.82)
RDW: 15.9 % — ABNORMAL HIGH (ref 11.0–14.6)
WBC: 3.8 10*3/uL — AB (ref 4.0–10.3)

## 2017-12-06 LAB — TSH: TSH: 0.366 u[IU]/mL (ref 0.320–4.118)

## 2017-12-06 MED ORDER — PALONOSETRON HCL INJECTION 0.25 MG/5ML
0.2500 mg | Freq: Once | INTRAVENOUS | Status: AC
  Administered 2017-12-06: 0.25 mg via INTRAVENOUS

## 2017-12-06 MED ORDER — CARBOPLATIN CHEMO INJECTION 600 MG/60ML
750.0000 mg | Freq: Once | INTRAVENOUS | Status: AC
  Administered 2017-12-06: 750 mg via INTRAVENOUS
  Filled 2017-12-06: qty 75

## 2017-12-06 MED ORDER — PALONOSETRON HCL INJECTION 0.25 MG/5ML
INTRAVENOUS | Status: AC
  Filled 2017-12-06: qty 5

## 2017-12-06 MED ORDER — SODIUM CHLORIDE 0.9 % IV SOLN
Freq: Once | INTRAVENOUS | Status: AC
  Administered 2017-12-06: 10:00:00 via INTRAVENOUS

## 2017-12-06 MED ORDER — SODIUM CHLORIDE 0.9 % IV SOLN
Freq: Once | INTRAVENOUS | Status: AC
  Administered 2017-12-06: 10:00:00 via INTRAVENOUS
  Filled 2017-12-06: qty 5

## 2017-12-06 MED ORDER — SODIUM CHLORIDE 0.9 % IV SOLN
200.0000 mg | Freq: Once | INTRAVENOUS | Status: AC
  Administered 2017-12-06: 200 mg via INTRAVENOUS
  Filled 2017-12-06: qty 8

## 2017-12-06 MED ORDER — SODIUM CHLORIDE 0.9 % IV SOLN
510.0000 mg/m2 | Freq: Once | INTRAVENOUS | Status: AC
  Administered 2017-12-06: 1100 mg via INTRAVENOUS
  Filled 2017-12-06: qty 40

## 2017-12-06 NOTE — Telephone Encounter (Signed)
Scheduled appt per 5/8 los - pt aware of appts - no print out wanted per pt . My chart active.

## 2017-12-06 NOTE — Progress Notes (Signed)
[facility] [phone]   [phone]  OFFICE PROGRESS NOTE  [name], MD [address] [place] [address]  DIAGNOSIS: Stage IVB(T1b, N2, M1c)non-small cell lung cancer, adenocarcinoma presented with right upper lobe lung nodule in addition to right hilar and mediastinal lymphadenopathy as well as metastatic liver and bone disease diagnosed in February 2019.  Biomarker Findings Microsatellite status - MS-Stable Tumor Mutational Burden - TMB-Low (4 Muts/Mb) Genomic Findings For a complete list of the genes assayed, please refer to the Appendix. EGFR exon 20 insertion ([id number]) RB1 loss exons 9-17 TP53 [id number] 7 Disease relevant genes with no reportable alterations: KRAS, ALK, BRAF, MET, RET, ERBB2, ROS1  PDL 1 expression 5%   PRIOR THERAPY: Palliative radiotherapy to the metastatic bone lesions in the cervical spine and left femur under the care of Dr. Sondra Come.  CURRENT THERAPY: Systemic chemotherapy with carboplatin for AUC of 5, Alimta 500 mg/M2 and Keytruda 200 mg IV every 3 weeks.  First dose October 04, 2017.  Status post 3 cycles.  INTERVAL HISTORY: Anthony [name] 47 y.o. male returns to the clinic today for follow-up visit.  The patient is feeling fine with no specific complaints except for intermittent right upper quadrant abdominal pain.  He was recently admitted to [facility] with epigastric and right upper quadrant abdominal pain.  He was diagnosed with gallbladder stones.  He has not seen general surgery yet.  The patient denied having any current nausea, vomiting, diarrhea or constipation.  He denied having any fever or chills.  He has no chest pain, shortness of breath, cough or hemoptysis.  He tolerated the last cycle of his treatment with chemotherapy fairly well.  He had repeat CT scan of the chest, abdomen and pelvis performed recently and he is here for evaluation and discussion of his  discuss results.  MEDICAL HISTORY: Past Medical History:  Diagnosis Date  . Acid reflux   . History of radiation therapy 09/21/17-10/04/17   C4 spine 30 Gy in 10 fractions, left femur 30 Gy in 10 fractions  . Non-small cell lung cancer ([place])   . NSTEMI (non-ST elevated myocardial infarction) ([place]) 06/13/2017   [name] 06/14/2017  . Tailbone injury since 1993   "cracked"    ALLERGIES:  has No Known Allergies.  MEDICATIONS:  Current Outpatient Medications  Medication Sig Dispense Refill  . Alum & Mag Hydroxide-Simeth (GI COCKTAIL) SUSP suspension Take 30 mLs by mouth 3 (three) times daily as needed for indigestion. Shake well. 900 mL 0  . aspirin EC 81 MG EC tablet Take 1 tablet (81 mg total) by mouth daily.    Marland Kitchen atorvastatin (LIPITOR) 80 MG tablet TAKE 1 TABLET BY MOUTH DAILY AT 6 PM. 30 tablet 9  . cetirizine (ZYRTEC) 10 MG tablet Take 10 mg by mouth daily.    . chlorproMAZINE (THORAZINE) 25 MG tablet Take 1 tablet (25 mg total) by mouth 3 (three) times daily as needed. 30 tablet 0  . clopidogrel (PLAVIX) 75 MG tablet TAKE 1 TABLET BY MOUTH EVERY DAY 30 tablet 9  . folic acid (FOLVITE) 1 MG tablet Take 1 tablet (1 mg total) by mouth daily. 30 tablet 2  . HYDROcodone-acetaminophen (NORCO/VICODIN) 5-325 MG tablet Take 1 tablet by mouth every 6 (six) hours as needed for up to 5 days. for pain 15 tablet 0  . lisinopril (PRINIVIL,ZESTRIL) 5 MG tablet Take 1 tablet (5 mg total) by mouth daily. 30 tablet 3  .  loratadine (CLARITIN) 10 MG tablet Take 10 mg daily as needed by mouth for allergies.    . metoprolol tartrate (LOPRESSOR) 25 MG tablet TAKE 1/2 TABLET BY MOUTH 2 TIMES DAILY. (Patient taking differently: TAKE 1/2 TABLET  (12.5 mg) BY MOUTH 2 TIMES DAILY.) 30 tablet 9  . pantoprazole (PROTONIX) 40 MG tablet Take 1 tablet (40 mg total) by mouth daily. 30 tablet 0  . prochlorperazine (COMPAZINE) 10 MG tablet Take 1 tablet (10 mg total) by mouth every 6 (six) hours as needed for nausea or  vomiting. 30 tablet 1  . sucralfate (CARAFATE) 1 g tablet Take 1 g by mouth daily.     No current facility-administered medications for this visit.     SURGICAL HISTORY:  Past Surgical History:  Procedure Laterality Date  . CARDIAC CATHETERIZATION  06/14/2017  . CORONARY ARTERY BYPASS GRAFT N/A 06/19/2017   Procedure: CORONARY ARTERY BYPASS GRAFTING (CABG), OFF PUMP, times one using the left internal mammary artery to LAD;  Surgeon: Grace Isaac, MD;  Location: [facility];  Service: Open Heart Surgery;  Laterality: N/A;  . ESOPHAGOGASTRODUODENOSCOPY (EGD) WITH PROPOFOL N/A 11/30/2017   Procedure: ESOPHAGOGASTRODUODENOSCOPY (EGD) WITH PROPOFOL;  Surgeon: Gatha Mayer, MD;  Location: [facility];  Service: Endoscopy;  Laterality: N/A;  . LEFT HEART CATH AND CORONARY ANGIOGRAPHY N/A 06/14/2017   Procedure: LEFT HEART CATH AND CORONARY ANGIOGRAPHY;  Surgeon: Sherren Mocha, MD;  Location: [facility];  Service: Cardiovascular;  Laterality: N/A;  . TEE WITHOUT CARDIOVERSION N/A 06/19/2017   Procedure: TRANSESOPHAGEAL ECHOCARDIOGRAM (TEE);  Surgeon: Grace Isaac, MD;  Location: [facility];  Service: Open Heart Surgery;  Laterality: N/A;  . TONSILLECTOMY  ~ 1977    REVIEW OF SYSTEMS:  Constitutional: negative Eyes: negative Ears, nose, mouth, throat, and face: negative Respiratory: negative Cardiovascular: negative Gastrointestinal: positive for abdominal pain Genitourinary:negative Integument/breast: negative Hematologic/lymphatic: negative Musculoskeletal:negative Neurological: negative Behavioral/Psych: negative Endocrine: negative Allergic/Immunologic: negative   PHYSICAL EXAMINATION: General appearance: alert, cooperative and no distress Head: Normocephalic, without obvious abnormality, atraumatic Neck: no adenopathy, no JVD, supple, symmetrical, trachea midline and thyroid not enlarged, symmetric, no tenderness/mass/nodules Lymph nodes: Cervical, supraclavicular, and  axillary nodes normal. Resp: clear to auscultation bilaterally Back: symmetric, no curvature. ROM normal. No CVA tenderness. Cardio: regular rate and rhythm, S1, S2 normal, no murmur, click, rub or gallop GI: soft, non-tender; bowel sounds normal; no masses,  no organomegaly Extremities: extremities normal, atraumatic, no cyanosis or edema Neurologic: Alert and oriented X 3, normal strength and tone. Normal symmetric reflexes. Normal coordination and gait  ECOG PERFORMANCE STATUS: 1 - Symptomatic but completely ambulatory  Blood pressure (!) 131/93, pulse 72, temperature 98.5 F (36.9 C), temperature source Oral, resp. rate 18, height '5\' 10"'$  (1.778 m), weight 197 lb 12.8 oz (89.7 kg), SpO2 100 %.  LABORATORY DATA: Lab Results  Component Value Date   WBC 3.8 (L) 12/06/2017   HGB 11.2 (L) 12/06/2017   HCT 31.7 (L) 12/06/2017   MCV 90.2 12/06/2017   PLT 357 12/06/2017      Chemistry      Component Value Date/Time   NA 139 12/01/2017 0937   NA 141 07/17/2017 1006   K 4.0 12/01/2017 0937   CL 103 12/01/2017 0937   CO2 27 12/01/2017 0937   BUN 11 12/01/2017 0937   BUN 12 07/17/2017 1006   CREATININE 0.79 12/01/2017 0937   CREATININE 0.83 11/22/2017 0748      Component Value Date/Time   CALCIUM 8.8 (L)  12/01/2017 0937   ALKPHOS 43 11/28/2017 1920   AST 28 11/28/2017 1920   AST 24 11/22/2017 0748   ALT 67 (H) 11/28/2017 1920   ALT 67 (H) 11/22/2017 0748   BILITOT 1.0 11/28/2017 1920   BILITOT 0.6 11/22/2017 0748       RADIOGRAPHIC STUDIES: Ct Abdomen Pelvis W Contrast  Result Date: 11/28/2017 CLINICAL DATA:  History of lung cancer. Acute generalized abdominal pain. EXAM: CT ABDOMEN AND PELVIS WITH CONTRAST TECHNIQUE: Multidetector CT imaging of the abdomen and pelvis was performed using the standard protocol following bolus administration of intravenous contrast. CONTRAST:  170m ISOVUE-300 IOPAMIDOL (ISOVUE-300) INJECTION 61% COMPARISON:  MRI of September 04, 2017.  PET  scan of August 29, 2017. FINDINGS: Lower chest: No acute abnormality. Hepatobiliary: 17 mm ill-defined low density is noted and central left hepatic lobe concerning for metastatic disease as described on prior PET scan and MRI. No gallstones are noted. No biliary dilatation is noted. Pancreas: Unremarkable. No pancreatic ductal dilatation or surrounding inflammatory changes. Spleen: Normal in size without focal abnormality. Adrenals/Urinary Tract: Adrenal glands are unremarkable. Kidneys are normal, without renal calculi, focal lesion, or hydronephrosis. Bladder is unremarkable. Stomach/Bowel: Stomach is within normal limits. Appendix appears normal. No evidence of bowel wall thickening, distention, or inflammatory changes. Sigmoid diverticulosis is noted without inflammation. Vascular/Lymphatic: No significant vascular findings are present. No enlarged abdominal or pelvic lymph nodes. Reproductive: Prostate is unremarkable. Other: No abdominal wall hernia or abnormality. No abdominopelvic ascites. Musculoskeletal: No acute or significant osseous findings. IMPRESSION: Grossly stable left hepatic lesion is noted consistent with metastatic disease. Sigmoid diverticulosis without inflammation. No other abnormality seen in the abdomen or pelvis. Electronically Signed   By: JMarijo Conception M.D.   On: 11/28/2017 21:05   Dg Chest Portable 1 View  Result Date: 11/28/2017 CLINICAL DATA:  Severe mid abdominal pain for a few days. History of lung cancer. EXAM: PORTABLE CHEST 1 VIEW COMPARISON:  Head CT August 29, 2017 and chest radiograph August 03, 2016 FINDINGS: Cardiomediastinal silhouette is unremarkable for this low inspiratory examination with crowded vasculature markings. Status post median sternotomy. The lungs are clear without pleural effusions or focal consolidations. Trachea projects midline and there is no pneumothorax. Included soft tissue planes and osseous structures are non-suspicious. IMPRESSION: No  active disease. Electronically Signed   By: CElon AlasM.D.   On: 11/28/2017 19:58   Ct Angio Chest Aorta W And/or Wo Contrast  Result Date: 11/28/2017 CLINICAL DATA:  History of lung cancer abdominal pain EXAM: CT ANGIOGRAPHY CHEST WITH CONTRAST TECHNIQUE: Multidetector CT imaging of the chest was performed using the standard protocol during bolus administration of intravenous contrast. Multiplanar CT image reconstructions and MIPs were obtained to evaluate the vascular anatomy. CONTRAST:  75 cc Isovue 370 intravenous COMPARISON:  CT abdomen pelvis 11/28/2017, PET CT 08/29/2017 FINDINGS: Cardiovascular: Non contrasted images of the chest demonstrate no intramural hematoma. Coronary vascular calcifications. Post CABG changes. No dissection is seen. Nonaneurysmal aorta. Normal heart size. No pericardial effusion. Mediastinum/Nodes: Midline trachea. No thyroid mass. Decreased size of right upper paratracheal lymph node now measuring 4 mm, compared with 10 mm previously. No new adenopathy. Fluid distention of the esophagus. Lungs/Pleura: No acute consolidation or pleural effusion. Significant decrease in size of the right upper lobe lung mass, residual soft tissue density in the region measuring 14 x 8 mm compared with previous measurements of 24 x 18 mm. Upper Abdomen: Hyperenhancement near the gallbladder fossa. Vague hypodensity in the left hepatic lobe  corresponding to metastatic focus better seen on the abdominopelvic CT. Musculoskeletal: Post sternotomy changes. No acute or suspicious abnormality Review of the MIP images confirms the above findings. IMPRESSION: 1. Negative for acute aortic dissection or aneurysm. 2. Significant decrease in size of right upper lobe lung mass and decreased mediastinal adenopathy compared to prior. 3. Diffuse fluid distension of the esophagus without esophageal wall thickening. 4. Known hepatic metastatic focus is better seen on the abdominopelvic CT Electronically Signed    By: Donavan Foil M.D.   On: 11/28/2017 23:34   US Abdomen Limited Ruq  Result Date: 12/01/2017 CLINICAL DATA:  Acute right upper quadrant abdominal pain. EXAM: ULTRASOUND ABDOMEN LIMITED RIGHT UPPER QUADRANT COMPARISON:  CT scan of November 28, 2017. FINDINGS: Gallbladder: 1.5 cm gallstone is noted with probable sludge. No gallbladder wall thickening or pericholecystic fluid is noted. No sonographic Murphy's sign is noted. Common bile duct: Diameter: 5 mm which is within normal limits. Liver: No focal lesion identified. The left hepatic abnormality seen on prior CT scan is not well visualized here. Within normal limits in parenchymal echogenicity. Portal vein is patent on color Doppler imaging with normal direction of blood flow towards the liver. IMPRESSION: Cholelithiasis without evidence of cholecystitis. Left hepatic lesion seen on prior CT scan is not visualized on this exam. Electronically Signed   By: Marijo Conception, M.D.   On: 12/01/2017 15:43    ASSESSMENT AND PLAN: This is a very pleasant 47 years old white male with stage IV non-small cell lung cancer, adenocarcinoma with resistant EGFR mutation in exon 20 and PDL 1 expression of 5%. The patient completed a course of palliative radiotherapy to the cervical spine as well as left hip under the care of Dr. Sondra Come.Marland Kitchen He is currently undergoing systemic chemotherapy with carboplatin, Alimta and Keytruda status post 3 cycles.   He continues to tolerate this treatment fairly well with no concerning complaints. He had repeat CT scan of the chest, abdomen and pelvis performed recently.  I personally and independently reviewed the scan images and discussed the results with the patient today.  His scan showed improvement of the right upper lobe pulmonary nodule and stable disease in the liver and bone. I recommended for the patient to proceed with cycle #4 of his treatment with carboplatin, Alimta and Ketruda (pembrolizumab) today.  Starting from cycle #5  the patient will be treated with only Alimta and Keytruda. He will come back for follow-up visit in 3 weeks for evaluation before starting the next cycle of his treatment. For the recently diagnosed cholelithiasis, I will refer the patient to general surgery for evaluation and recommendation regarding his condition. For hypertension he will discuss with his cardiologist resuming his blood pressure medication. The patient was advised to call if he has any concerning symptoms in the interval. The patient voices understanding of current disease status and treatment options and is in agreement with the current care plan.  All questions were answered. The patient knows to call the clinic with any problems, questions or concerns. We can certainly see the patient much sooner if necessary.  Disclaimer: This note was dictated with voice recognition software. Similar sounding words can inadvertently be transcribed and may not be corrected upon review.

## 2017-12-08 ENCOUNTER — Other Ambulatory Visit: Payer: Self-pay | Admitting: *Deleted

## 2017-12-08 ENCOUNTER — Telehealth: Payer: Self-pay | Admitting: Interventional Cardiology

## 2017-12-08 MED ORDER — LISINOPRIL 5 MG PO TABS: 5.0000 mg | ORAL_TABLET | Freq: Every day | ORAL | 0 refills | Status: DC

## 2017-12-08 NOTE — Telephone Encounter (Signed)
New message     *STAT* If patient is at the pharmacy, call can be transferred to refill team.   1. Which medications need to be refilled? (please list name of each medication and dose if known) lisinopril (PRINIVIL,ZESTRIL) 5 MG tablet  2. Which pharmacy/location (including street and city if local pharmacy) is medication to be sent to?[id number] - [place], [place] - [address]  3. Do they need a 30 day or 90 day supply? [place]

## 2017-12-08 NOTE — Telephone Encounter (Signed)
Dr Tamala Julian has never refilled this for the patient. Okay to refill? Please advise. Thanks, MI

## 2017-12-08 NOTE — Telephone Encounter (Signed)
Ok to give one 30 day supply with no refills.  Pt seeing Cecilie Kicks, NP on 5/15 for medication f/u.  Thanks!

## 2017-12-12 NOTE — Progress Notes (Signed)
Cardiology Office Note   Date:  12/13/2017   ID:  Anthony [name], DOB 1971/04/04, MRN [id number]  PCP:  No primary care provider on file.  Cardiologist:  [name]    Chief Complaint  Patient presents with  . Coronary Artery Disease      History of Present Illness: Anthony [name] is a 47 y.o. male who presents for CAD. Post hospitalization 12/01/17 for gallstones and GERD and esophagitis.    Pt with hx of NSTEMI.  Pt presented 06/14/17 with chest pain asa and NTG with relief of pain. He also has acid reflux and chews tobacco.  Cardiac cath revealed severe long segment proximal LAD stenosis, chronic total occl of LCX with Rt tyo LT and Lt to Lt  preserved LV function.  [name] felt CABG was favored over PCI.  Pt seen by [name] and plan for CABG X 1 off pump.  Pt underwent this 06/19/17 with LIMA to LAD.   He did have a lung nodule and is for CT of his chest .  He was discharged. 06/21/17--Pt on plavix for 1 year with ASA for NSTEMI, may stop plavix in 06/2018.  Continue ASA.     Last seen 07/17/17. On last visit he was progressing well.    Today it is noted he was dx with stage 4 Lung cancer.  He is undergoing chemo every 3 weeks.  His nodule has decreased on most recent CT scan.  He was then hospitalized as above.  [name] is to arrange surgical consult.    He is still working and walks a lot with job, he is able to walk up 2 flights of steps without problems.  No chest pain and no SOB.  He is eating healthy.   He is on carafate and compazine for his esophagitis.    Past Medical History:  Diagnosis Date  . Acid reflux   . History of radiation therapy 09/21/17-10/04/17   C4 spine 30 Gy in 10 fractions, left femur 30 Gy in 10 fractions  . Non-small cell lung cancer ([place])   . NSTEMI (non-ST elevated myocardial infarction) ([place]) 06/13/2017   [name] 06/14/2017  . Tailbone injury since 1993   "cracked"    Past Surgical History:  Procedure Laterality Date  . CARDIAC  CATHETERIZATION  06/14/2017  . CORONARY ARTERY BYPASS GRAFT N/A 06/19/2017   Procedure: CORONARY ARTERY BYPASS GRAFTING (CABG), OFF PUMP, times one using the left internal mammary artery to LAD;  Surgeon: Grace Isaac, MD;  Location: [facility];  Service: Open Heart Surgery;  Laterality: N/A;  . ESOPHAGOGASTRODUODENOSCOPY (EGD) WITH PROPOFOL N/A 11/30/2017   Procedure: ESOPHAGOGASTRODUODENOSCOPY (EGD) WITH PROPOFOL;  Surgeon: Gatha Mayer, MD;  Location: [facility];  Service: Endoscopy;  Laterality: N/A;  . LEFT HEART CATH AND CORONARY ANGIOGRAPHY N/A 06/14/2017   Procedure: LEFT HEART CATH AND CORONARY ANGIOGRAPHY;  Surgeon: Sherren Mocha, MD;  Location: [facility];  Service: Cardiovascular;  Laterality: N/A;  . TEE WITHOUT CARDIOVERSION N/A 06/19/2017   Procedure: TRANSESOPHAGEAL ECHOCARDIOGRAM (TEE);  Surgeon: Grace Isaac, MD;  Location: [facility];  Service: Open Heart Surgery;  Laterality: N/A;  . TONSILLECTOMY  ~ 1977     Current Outpatient Medications  Medication Sig Dispense Refill  . aspirin EC 81 MG EC tablet Take 1 tablet (81 mg total) by mouth daily.    Marland Kitchen atorvastatin (LIPITOR) 80 MG tablet TAKE 1 TABLET BY MOUTH DAILY AT 6 PM. 30 tablet 9  . cetirizine (  ZYRTEC) 10 MG tablet Take 10 mg by mouth daily.    . chlorproMAZINE (THORAZINE) 25 MG tablet Take 1 tablet (25 mg total) by mouth 3 (three) times daily as needed. 30 tablet 0  . clopidogrel (PLAVIX) 75 MG tablet TAKE 1 TABLET BY MOUTH EVERY DAY 30 tablet 9  . folic acid (FOLVITE) 1 MG tablet Take 1 tablet (1 mg total) by mouth daily. 30 tablet 2  . lisinopril (PRINIVIL,ZESTRIL) 5 MG tablet Take 1 tablet (5 mg total) by mouth daily. 30 tablet 0  . loratadine (CLARITIN) 10 MG tablet Take 10 mg daily as needed by mouth for allergies.    . metoprolol tartrate (LOPRESSOR) 25 MG tablet TAKE 1/2 TABLET BY MOUTH 2 TIMES DAILY. (Patient taking differently: TAKE 1/2 TABLET  (12.5 mg) BY MOUTH 2 TIMES DAILY.) 30 tablet 9  .  pantoprazole (PROTONIX) 40 MG tablet Take 1 tablet (40 mg total) by mouth daily. 30 tablet 0  . prochlorperazine (COMPAZINE) 10 MG tablet Take 1 tablet (10 mg total) by mouth every 6 (six) hours as needed for nausea or vomiting. 30 tablet 1  . sucralfate (CARAFATE) 1 g tablet Take 1 g by mouth daily.    . Alum & Mag Hydroxide-Simeth (GI COCKTAIL) SUSP suspension Take 30 mLs by mouth 3 (three) times daily as needed for indigestion. Shake well. (Patient not taking: Reported on 12/13/2017) 900 mL 0   No current facility-administered medications for this visit.     Allergies:   Patient has no known allergies.    Social History:  The patient  reports that he has never smoked. His smokeless tobacco use includes snuff. He reports that he drinks alcohol. He reports that he does not use drugs.   Family History:  The patient's family history includes Alcohol abuse in his mother; Aneurysm in his maternal grandmother; Arrhythmia in his father; Heart disease in his father.    ROS:  General:no colds or fevers, some weight decrease Skin:no rashes or ulcers, chest incision wide but healed.  HEENT:no blurred vision, no congestion CV:see HPI PUL:see HPI GI:no diarrhea constipation or melena, + indigestion see HPI GU:no hematuria, no dysuria MS:no joint pain, no claudication Neuro:no syncope, no lightheadedness Endo:no diabetes, no thyroid disease  Wt Readings from Last 3 Encounters:  12/13/17 198 lb (89.8 kg)  12/06/17 197 lb 12.8 oz (89.7 kg)  11/30/17 205 lb (93 kg)     PHYSICAL EXAM: VS:  BP 104/72 (BP Location: Right Arm, Patient Position: Sitting, Cuff Size: Normal)   Pulse 91   Ht 5\' 10"  (1.778 m)   Wt 198 lb (89.8 kg)   SpO2 99%   BMI 28.41 kg/m  , BMI Body mass index is 28.41 kg/m. General:Pleasant affect, NAD Skin:Warm and dry, brisk capillary refill HEENT:normocephalic, sclera clear, mucus membranes moist Neck:supple, no JVD, no bruits  Heart:S1S2 RRR without murmur, gallup, rub  or click Lungs:clear without rales, rhonchi, or wheezes ZWC:HENI, non tender, + BS, do not palpate liver spleen or masses Ext:no lower ext edema, 2+ pedal pulses, 2+ radial pulses Neuro:alert and oriented X 3, MAE, follows commands, + facial symmetry    EKG:  EKG is NOT ordered today. The ekg from the hospital 11/30/17 demonstrates SR with incomplete RBBB - no ST elevation.  No changes from previous.     Recent Labs: 06/20/2017: Magnesium 2.0 12/06/2017: TSH 0.366 12/13/2017: ALT 74; BUN 16; Creatinine 0.87; Hemoglobin 11.9; Platelet Count 268; Potassium 3.8; Sodium 139    Lipid Panel  Component Value Date/Time   CHOL 187 06/14/2017 0754   TRIG 160 (H) 06/14/2017 0754   HDL 26 (L) 06/14/2017 0754   CHOLHDL 7.2 06/14/2017 0754   VLDL 32 06/14/2017 0754   LDLCALC 129 (H) 06/14/2017 0754       Other studies Reviewed: Additional studies/ records that were reviewed today include: . Procedures   LEFT HEART CATH AND CORONARY ANGIOGRAPHY  Conclusion   1.  Severe long segment proximal LAD stenosis from the ostium of the LAD into the mid vessel spanning the origin of a large first diagonal branch 2.  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  Mild nonobstructive irregularity of the left main and mild diffuse nonobstructive disease of the right coronary artery 4.  Mild segmental contraction abnormality of the left ventricle with preserved overall LV systolic function  Films reviewed with colleagues.  Considering the length of the patient's proximal LAD stenosis and involvement of a large first diagonal branch, favor CABG over PCI.  Discussed plan with patient who is in agreement.  Cardiac surgical consultation will be called.  We will resume heparin in 2 hours.  We will continue IV nitroglycerin.  Hopefully the patient can be treated with arterial conduit considering his young age.    Echo 06/15/17 Study Conclusions  - Left ventricle: The cavity size was normal. Wall thickness was   normal. Systolic function was normal. The estimated ejection   fraction was in the range of 55% to 60%.  CABG 06/19/18  PREOPERATIVE DIAGNOSIS:  Critical proximal LAD stenosis.  POSTOPERATIVE DIAGNOSIS:  Critical proximal LAD stenosis.  SURGICAL PROCEDURE:  Off-pump coronary artery bypass grafting x1 with left internal mammary to the left anterior descending coronary artery.    ASSESSMENT AND PLAN:  1.  CAD with CABG LIMA to LAD 06/19/17 off pump.  Stable and active, no chest pain or SOB.  Residual Chronic total occlusion of the left circumflex with right to left and left to left collaterals supplying what appears to be a small OM branch.  Continue BB.  2.  NSTEMI  06/14/17 - no PCI but CABG, keep on ASA and Plavix for 1 year, may stop plavix 06/19/18 and continue ASA 81 mg daily.   3.  HLD on statin, continue will check CMP and Lipids today  4.  Stage 4 Rt lung cancer, on chemo followed by Dr. [name]   5.  PRE-OP eval  Cholelithiasis  - will see surgeon in future.  I do not have name currently.  But if no changes pt may hold the plavix and ASA 5 days prior to procedure and resume as soon as possible afterwards.  Pt meets 4 mets for surgery.     Primary Cardiologist: Sinclair Grooms, MD  Chart reviewed as part of pre-operative protocol coverage. Given past medical history and time since last visit, based on ACC/AHA guidelines, Vontrell Finch would be at acceptable risk for the planned procedure without further cardiovascular testing. I did not send.   Please call with questions.  Cecilie Kicks, NP 12/13/2017, 11:22 AM        Current medicines are reviewed with the patient today.  The patient Has no concerns regarding medicines.  The following changes have been made:  See above Labs/ tests ordered today include:see above  Disposition:   FU:  see  above  Signed, Cecilie Kicks, NP  12/13/2017  [facility] [place], [address] [place], [place] Phone: [phone]; Fax: [phone]

## 2017-12-13 ENCOUNTER — Ambulatory Visit: Payer: BLUE CROSS/BLUE SHIELD | Admitting: Cardiology

## 2017-12-13 ENCOUNTER — Inpatient Hospital Stay: Payer: BLUE CROSS/BLUE SHIELD

## 2017-12-13 ENCOUNTER — Encounter: Payer: Self-pay | Admitting: Cardiology

## 2017-12-13 VITALS — BP 104/72 | HR 91 | Ht 70.0 in | Wt 198.0 lb

## 2017-12-13 DIAGNOSIS — I214 Non-ST elevation (NSTEMI) myocardial infarction: Secondary | ICD-10-CM

## 2017-12-13 DIAGNOSIS — I2510 Atherosclerotic heart disease of native coronary artery without angina pectoris: Secondary | ICD-10-CM

## 2017-12-13 DIAGNOSIS — C3491 Malignant neoplasm of unspecified part of right bronchus or lung: Secondary | ICD-10-CM

## 2017-12-13 DIAGNOSIS — Z951 Presence of aortocoronary bypass graft: Secondary | ICD-10-CM

## 2017-12-13 DIAGNOSIS — C3490 Malignant neoplasm of unspecified part of unspecified bronchus or lung: Secondary | ICD-10-CM | POA: Diagnosis not present

## 2017-12-13 DIAGNOSIS — C3411 Malignant neoplasm of upper lobe, right bronchus or lung: Secondary | ICD-10-CM | POA: Diagnosis not present

## 2017-12-13 DIAGNOSIS — K8020 Calculus of gallbladder without cholecystitis without obstruction: Secondary | ICD-10-CM

## 2017-12-13 DIAGNOSIS — E785 Hyperlipidemia, unspecified: Secondary | ICD-10-CM | POA: Diagnosis not present

## 2017-12-13 LAB — CMP (CANCER CENTER ONLY)
ALBUMIN: 3.7 g/dL (ref 3.5–5.0)
ALT: 74 U/L — ABNORMAL HIGH (ref 0–55)
ANION GAP: 10 (ref 3–11)
AST: 39 U/L — ABNORMAL HIGH (ref 5–34)
Alkaline Phosphatase: 69 U/L (ref 40–150)
BILIRUBIN TOTAL: 0.5 mg/dL (ref 0.2–1.2)
BUN: 16 mg/dL (ref 7–26)
CO2: 29 mmol/L (ref 22–29)
Calcium: 9.7 mg/dL (ref 8.4–10.4)
Chloride: 100 mmol/L (ref 98–109)
Creatinine: 0.87 mg/dL (ref 0.70–1.30)
GFR, Estimated: >60 mL/min (ref >60)
GLUCOSE: 92 mg/dL (ref 70–140)
POTASSIUM: 3.8 mmol/L (ref 3.5–5.1)
Sodium: 139 mmol/L (ref 136–145)
TOTAL PROTEIN: 7 g/dL (ref 6.4–8.3)

## 2017-12-13 LAB — CBC WITH DIFFERENTIAL (CANCER CENTER ONLY)
BASOS PCT: 1 %
Basophils Absolute: 0 10*3/uL (ref 0.0–0.1)
Eosinophils Absolute: 0.1 10*3/uL (ref 0.0–0.5)
Eosinophils Relative: 3 %
HEMATOCRIT: 34 % — AB (ref 38.4–49.9)
Hemoglobin: 11.9 g/dL — ABNORMAL LOW (ref 13.0–17.1)
Lymphocytes Relative: 36 %
Lymphs Abs: 1 10*3/uL (ref 0.9–3.3)
MCH: 31.2 pg (ref 27.2–33.4)
MCHC: 35 g/dL (ref 32.0–36.0)
MCV: 89.2 fL (ref 79.3–98.0)
MONO ABS: 0.3 10*3/uL (ref 0.1–0.9)
MONOS PCT: 11 %
NEUTROS ABS: 1.3 10*3/uL — AB (ref 1.5–6.5)
Neutrophils Relative %: 49 %
Platelet Count: 268 10*3/uL (ref 140–400)
RBC: 3.81 MIL/uL — ABNORMAL LOW (ref 4.20–5.82)
RDW: 14.3 % (ref 11.0–14.6)
WBC Count: 2.7 10*3/uL — ABNORMAL LOW (ref 4.0–10.3)

## 2017-12-13 NOTE — Patient Instructions (Signed)
Your physician recommends that you continue on your current medications as directed. Please refer to the Current Medication list given to you today. Your physician recommends that you return for lab work in:  [place] physician recommends that you schedule a follow-up appointment in:  4 - [place]

## 2017-12-19 ENCOUNTER — Telehealth: Payer: Self-pay | Admitting: Medical Oncology

## 2017-12-19 NOTE — Telephone Encounter (Signed)
Pt called back & stated that he already had an answer to his question & no need to return call.

## 2017-12-19 NOTE — Telephone Encounter (Signed)
Returned call re: "Gallbladder". Left tmessage to return my call.

## 2017-12-20 ENCOUNTER — Inpatient Hospital Stay: Payer: BLUE CROSS/BLUE SHIELD

## 2017-12-20 DIAGNOSIS — C3411 Malignant neoplasm of upper lobe, right bronchus or lung: Secondary | ICD-10-CM | POA: Diagnosis not present

## 2017-12-20 DIAGNOSIS — C3491 Malignant neoplasm of unspecified part of right bronchus or lung: Secondary | ICD-10-CM

## 2017-12-20 LAB — CBC WITH DIFFERENTIAL (CANCER CENTER ONLY)
BASOS ABS: 0 10*3/uL (ref 0.0–0.1)
Basophils Relative: 0 %
EOS PCT: 1 %
Eosinophils Absolute: 0 10*3/uL (ref 0.0–0.5)
HEMATOCRIT: 32.8 % — AB (ref 38.4–49.9)
Hemoglobin: 11.2 g/dL — ABNORMAL LOW (ref 13.0–17.1)
LYMPHS ABS: 1 10*3/uL (ref 0.9–3.3)
LYMPHS PCT: 29 %
MCH: 31.4 pg (ref 27.2–33.4)
MCHC: 34.1 g/dL (ref 32.0–36.0)
MCV: 91.9 fL (ref 79.3–98.0)
MONO ABS: 0.3 10*3/uL (ref 0.1–0.9)
MONOS PCT: 9 %
NEUTROS ABS: 2.2 10*3/uL (ref 1.5–6.5)
Neutrophils Relative %: 61 %
PLATELETS: 169 10*3/uL (ref 140–400)
RBC: 3.57 MIL/uL — ABNORMAL LOW (ref 4.20–5.82)
RDW: 15.7 % — ABNORMAL HIGH (ref 11.0–14.6)
WBC Count: 3.5 10*3/uL — ABNORMAL LOW (ref 4.0–10.3)

## 2017-12-20 LAB — CMP (CANCER CENTER ONLY)
ALT: 102 U/L — ABNORMAL HIGH (ref 0–55)
ANION GAP: 9 (ref 3–11)
AST: 38 U/L — ABNORMAL HIGH (ref 5–34)
Albumin: 3.6 g/dL (ref 3.5–5.0)
Alkaline Phosphatase: 63 U/L (ref 40–150)
BILIRUBIN TOTAL: 0.6 mg/dL (ref 0.2–1.2)
BUN: 12 mg/dL (ref 7–26)
CO2: 28 mmol/L (ref 22–29)
Calcium: 9.5 mg/dL (ref 8.4–10.4)
Chloride: 104 mmol/L (ref 98–109)
Creatinine: 0.82 mg/dL (ref 0.70–1.30)
GFR, Est AFR Am: >60 mL/min (ref >60)
GLUCOSE: 84 mg/dL (ref 70–140)
POTASSIUM: 4.2 mmol/L (ref 3.5–5.1)
Sodium: 141 mmol/L (ref 136–145)
TOTAL PROTEIN: 6.5 g/dL (ref 6.4–8.3)

## 2017-12-21 ENCOUNTER — Ambulatory Visit: Payer: Self-pay | Admitting: General Surgery

## 2017-12-21 ENCOUNTER — Telehealth: Payer: Self-pay | Admitting: *Deleted

## 2017-12-21 NOTE — H&P (View-Only) (Signed)
History of Present Illness Ralene Ok MD; 12/21/2017 11:59 AM) The patient is a 47 year old male who presents for evaluation of gall stones. Referred by: Dr. Inda Merlin Chief Complaint: Gallstones  Patient is a 47 year old male, with a history of stage IV lung cancer currently on chemotherapy, CAD, previous CABG, on Plavix, who comes in with a 2 week history of a golf bladder attack. Patient states that he was at home one evening prior to dinner and had epigastric abdominal pain. He states this radiated to the right side. He states this was associated with sweating, no nausea no vomiting. Patient was seen in the ER after this event. He states that he underwent CT scan ultrasound. Ultrasound revealed a 1.5 cm gallstone. I reviewed the scan personally. Patient also had LFTs that were within normal limits.  Patient has had a few smaller episodes of epigastric abdominal pain since her initial attack.  Patient is getting chemotherapy cycles every 3 weeks.  Patient has been cleared by [name] to hold his Plavix and aspirin prior to surgery.  Patient had no previous abdominal surgery.   Past Surgical History Mammie Lorenzo, LPN; 10/06/6576 46:96 AM) Bypass Surgery for Poor Blood Flow to Legs  Coronary Artery Bypass Graft   Allergies Mammie Lorenzo, LPN; 2/95/2841 32:44 AM) No Known Allergies [12/21/2017]:  Medication History Mammie Lorenzo, LPN; 0/05/2724 36:64 AM) Atorvastatin Calcium (80MG  Tablet, Oral) Active. Clopidogrel Bisulfate (75MG  Tablet, Oral) Active. Folic Acid (1MG  Tablet, Oral) Active. Lisinopril (5MG  Tablet, Oral) Active. Metoprolol Tartrate (25MG  Tablet, Oral) Active. Pantoprazole Sodium (40MG  Tablet DR, Oral) Active. ChlorproMAZINE HCl (25MG  Tablet, Oral) Active. Medications Reconciled Aspirin (81MG  Tablet, Oral) Active.  Social History Mammie Lorenzo, LPN; 11/01/4740 59:56 AM) Alcohol use  Occasional alcohol use. Illicit drug use  Remotely  quit drug use. No caffeine use  Tobacco use  Never smoker.  Family History Mammie Lorenzo, LPN; 3/87/5643 32:95 AM) Heart Disease  Brother, Father. Heart disease in male family member before age 24  Hypertension  Brother, Father.  Other Problems Mammie Lorenzo, LPN; 1/88/4166 06:30 AM) Back Pain  Cholelithiasis  High blood pressure  Lung Cancer  Myocardial infarction     Review of Systems Ralene Ok MD; 12/21/2017 11:57 AM) General Present- Appetite Loss, Fatigue and Night Sweats. Not Present- Chills, Fever, Weight Gain and Weight Loss. Skin Not Present- Change in Wart/Mole, Dryness, Hives, Jaundice, New Lesions, Non-Healing Wounds, Rash and Ulcer. HEENT Present- Seasonal Allergies. Not Present- Earache, Hearing Loss, Hoarseness, Nose Bleed, Oral Ulcers, Ringing in the Ears, Sinus Pain, Sore Throat, Visual Disturbances, Wears glasses/contact lenses and Yellow Eyes. Respiratory Not Present- Bloody sputum, Chronic Cough, Difficulty Breathing, Snoring and Wheezing. Cardiovascular Present- Shortness of Breath. Not Present- Chest Pain, Difficulty Breathing Lying Down, Leg Cramps, Palpitations, Rapid Heart Rate and Swelling of Extremities. Gastrointestinal Present- Abdominal Pain, Constipation and Excessive gas. Not Present- Bloating, Bloody Stool, Change in Bowel Habits, Chronic diarrhea, Difficulty Swallowing, Gets full quickly at meals, Hemorrhoids, Indigestion, Nausea, Rectal Pain and Vomiting. Male Genitourinary Present- Change in Urinary Stream. Not Present- Blood in Urine, Frequency, Impotence, Nocturia, Painful Urination, Urgency and Urine Leakage. Musculoskeletal Not Present- Back Pain, Joint Pain, Joint Stiffness, Muscle Pain, Muscle Weakness and Swelling of Extremities. Neurological Not Present- Decreased Memory, Fainting, Headaches, Numbness, Seizures, Tingling, Tremor, Trouble walking and Weakness. Psychiatric Not Present- Anxiety, Bipolar, Change in Sleep Pattern,  Depression, Fearful and Frequent crying. Endocrine Not Present- Cold Intolerance, Excessive Hunger, Hair Changes, Heat Intolerance, Hot flashes and New Diabetes. Hematology Present- Blood Thinners.  Not Present- Easy Bruising, Excessive bleeding, Gland problems, HIV and Persistent Infections. All other systems negative  Vitals Claiborne Billings Dockery LPN; 6/80/8811 03:15 AM) 12/21/2017 11:47 AM Weight: 201 lb Height: 70in Body Surface Area: 2.09 m Body Mass Index: 28.84 kg/m  Temp.: 97.66F(Temporal)  Pulse: 80 (Regular)  BP: 120/72 (Sitting, Left Arm, Standard)       Physical Exam Ralene Ok MD; 12/21/2017 11:59 AM) The physical exam findings are as follows: Note:Constitutional: No acute distress, conversant, appears stated age  Eyes: Anicteric sclerae, moist conjunctiva, no lid lag  Neck: No thyromegaly, trachea midline, no cervical lymphadenopathy  Lungs: Clear to auscultation biilaterally, normal respiratory effot  Cardiovascular: regular rate & rhythm, no murmurs, no peripheal edema, pedal pulses 2+  GI: Soft, no masses or hepatosplenomegaly, non-tender to palpation  MSK: Normal gait, no clubbing cyanosis, edema  Skin: No rashes, palpation reveals normal skin turgor  Psychiatric: Appropriate judgment and insight, oriented to person, place, and time    Assessment & Plan Ralene Ok MD; 12/21/2017 12:00 PM) SYMPTOMATIC CHOLELITHIASIS (K80.20) Impression: 47 year old male with symptomatic cholelithiasis Patient is being cleared [name] to hold his Plavix/aspirin and cleared from a cardiac standpoint.  1. We will proceed to the operating room for a laparoscopic cholecystectomy  2. Risks and benefits were discussed with the patient to generally include, but not limited to: infection, bleeding, possible need for post op ERCP, damage to the bile ducts, bile leak, and possible need for further surgery. Alternatives were offered and described. All questions  were answered and the patient voiced understanding of the procedure and wishes to proceed at this point with a laparoscopic cholecystectomy

## 2017-12-21 NOTE — Telephone Encounter (Signed)
Spoke with pt and was informed that pt met with the surgeon Dr. Rosendo Gros today.   Pt is scheduled for gallbladder surgery on  12/29/17. Pt has chemo on  12/27/17.   Pt would like to know if it is ok with Dr. [name], or what else MD would recommend. Pt's    Phone     [phone].

## 2017-12-21 NOTE — Telephone Encounter (Signed)
We will need to reschedule his chemo to be done 1-2 weeks after the surgery.

## 2017-12-21 NOTE — H&P (Signed)
History of Present Illness Ralene Ok MD; 12/21/2017 11:59 AM) The patient is a 47 year old male who presents for evaluation of gall stones. Referred by: Dr. Inda Merlin Chief Complaint: Gallstones  Patient is a 47 year old male, with a history of stage IV lung cancer currently on chemotherapy, CAD, previous CABG, on Plavix, who comes in with a 2 week history of a golf bladder attack. Patient states that he was at home one evening prior to dinner and had epigastric abdominal pain. He states this radiated to the right side. He states this was associated with sweating, no nausea no vomiting. Patient was seen in the ER after this event. He states that he underwent CT scan ultrasound. Ultrasound revealed a 1.5 cm gallstone. I reviewed the scan personally. Patient also had LFTs that were within normal limits.  Patient has had a few smaller episodes of epigastric abdominal pain since her initial attack.  Patient is getting chemotherapy cycles every 3 weeks.  Patient has been cleared by [name] to hold his Plavix and aspirin prior to surgery.  Patient had no previous abdominal surgery.   Past Surgical History Mammie Lorenzo, LPN; 01/15/736 10:62 AM) Bypass Surgery for Poor Blood Flow to Legs  Coronary Artery Bypass Graft   Allergies Mammie Lorenzo, LPN; 6/94/8546 27:03 AM) No Known Allergies [12/21/2017]:  Medication History Mammie Lorenzo, LPN; 5/00/9381 82:99 AM) Atorvastatin Calcium (80MG  Tablet, Oral) Active. Clopidogrel Bisulfate (75MG  Tablet, Oral) Active. Folic Acid (1MG  Tablet, Oral) Active. Lisinopril (5MG  Tablet, Oral) Active. Metoprolol Tartrate (25MG  Tablet, Oral) Active. Pantoprazole Sodium (40MG  Tablet DR, Oral) Active. ChlorproMAZINE HCl (25MG  Tablet, Oral) Active. Medications Reconciled Aspirin (81MG  Tablet, Oral) Active.  Social History Mammie Lorenzo, LPN; 3/71/6967 89:38 AM) Alcohol use  Occasional alcohol use. Illicit drug use  Remotely  quit drug use. No caffeine use  Tobacco use  Never smoker.  Family History Mammie Lorenzo, LPN; 08/01/7508 25:85 AM) Heart Disease  Brother, Father. Heart disease in male family member before age 60  Hypertension  Brother, Father.  Other Problems Mammie Lorenzo, LPN; 2/77/8242 35:36 AM) Back Pain  Cholelithiasis  High blood pressure  Lung Cancer  Myocardial infarction     Review of Systems Ralene Ok MD; 12/21/2017 11:57 AM) General Present- Appetite Loss, Fatigue and Night Sweats. Not Present- Chills, Fever, Weight Gain and Weight Loss. Skin Not Present- Change in Wart/Mole, Dryness, Hives, Jaundice, New Lesions, Non-Healing Wounds, Rash and Ulcer. HEENT Present- Seasonal Allergies. Not Present- Earache, Hearing Loss, Hoarseness, Nose Bleed, Oral Ulcers, Ringing in the Ears, Sinus Pain, Sore Throat, Visual Disturbances, Wears glasses/contact lenses and Yellow Eyes. Respiratory Not Present- Bloody sputum, Chronic Cough, Difficulty Breathing, Snoring and Wheezing. Cardiovascular Present- Shortness of Breath. Not Present- Chest Pain, Difficulty Breathing Lying Down, Leg Cramps, Palpitations, Rapid Heart Rate and Swelling of Extremities. Gastrointestinal Present- Abdominal Pain, Constipation and Excessive gas. Not Present- Bloating, Bloody Stool, Change in Bowel Habits, Chronic diarrhea, Difficulty Swallowing, Gets full quickly at meals, Hemorrhoids, Indigestion, Nausea, Rectal Pain and Vomiting. Male Genitourinary Present- Change in Urinary Stream. Not Present- Blood in Urine, Frequency, Impotence, Nocturia, Painful Urination, Urgency and Urine Leakage. Musculoskeletal Not Present- Back Pain, Joint Pain, Joint Stiffness, Muscle Pain, Muscle Weakness and Swelling of Extremities. Neurological Not Present- Decreased Memory, Fainting, Headaches, Numbness, Seizures, Tingling, Tremor, Trouble walking and Weakness. Psychiatric Not Present- Anxiety, Bipolar, Change in Sleep Pattern,  Depression, Fearful and Frequent crying. Endocrine Not Present- Cold Intolerance, Excessive Hunger, Hair Changes, Heat Intolerance, Hot flashes and New Diabetes. Hematology Present- Blood Thinners.  Not Present- Easy Bruising, Excessive bleeding, Gland problems, HIV and Persistent Infections. All other systems negative  Vitals Claiborne Billings Dockery LPN; 3/00/5110 21:11 AM) 12/21/2017 11:47 AM Weight: 201 lb Height: 70in Body Surface Area: 2.09 m Body Mass Index: 28.84 kg/m  Temp.: 97.71F(Temporal)  Pulse: 80 (Regular)  BP: 120/72 (Sitting, Left Arm, Standard)       Physical Exam Ralene Ok MD; 12/21/2017 11:59 AM) The physical exam findings are as follows: Note:Constitutional: No acute distress, conversant, appears stated age  Eyes: Anicteric sclerae, moist conjunctiva, no lid lag  Neck: No thyromegaly, trachea midline, no cervical lymphadenopathy  Lungs: Clear to auscultation biilaterally, normal respiratory effot  Cardiovascular: regular rate & rhythm, no murmurs, no peripheal edema, pedal pulses 2+  GI: Soft, no masses or hepatosplenomegaly, non-tender to palpation  MSK: Normal gait, no clubbing cyanosis, edema  Skin: No rashes, palpation reveals normal skin turgor  Psychiatric: Appropriate judgment and insight, oriented to person, place, and time    Assessment & Plan Ralene Ok MD; 12/21/2017 12:00 PM) SYMPTOMATIC CHOLELITHIASIS (K80.20) Impression: 48 year old male with symptomatic cholelithiasis Patient is being cleared [name] to hold his Plavix/aspirin and cleared from a cardiac standpoint.  1. We will proceed to the operating room for a laparoscopic cholecystectomy  2. Risks and benefits were discussed with the patient to generally include, but not limited to: infection, bleeding, possible need for post op ERCP, damage to the bile ducts, bile leak, and possible need for further surgery. Alternatives were offered and described. All questions  were answered and the patient voiced understanding of the procedure and wishes to proceed at this point with a laparoscopic cholecystectomy

## 2017-12-22 ENCOUNTER — Other Ambulatory Visit (HOSPITAL_COMMUNITY): Payer: BLUE CROSS/BLUE SHIELD

## 2017-12-22 ENCOUNTER — Telehealth: Payer: Self-pay | Admitting: Internal Medicine

## 2017-12-22 NOTE — Telephone Encounter (Signed)
Called apptper 5/24 sch message - per MM okay with patient coming back on next scheduled treatment day - pt is aware of appts being cancelled.

## 2017-12-22 NOTE — Telephone Encounter (Signed)
Message to scheduling to move Infusion appt from 5/29 to 2 weeks after 5/31.

## 2017-12-22 NOTE — Telephone Encounter (Signed)
Message to scheduling to move infusion appt out 1-2 weeks after surgery.

## 2017-12-26 ENCOUNTER — Other Ambulatory Visit: Payer: Self-pay

## 2017-12-26 ENCOUNTER — Encounter (HOSPITAL_COMMUNITY): Payer: Self-pay | Admitting: *Deleted

## 2017-12-27 ENCOUNTER — Other Ambulatory Visit: Payer: Self-pay | Admitting: Cardiology

## 2017-12-27 ENCOUNTER — Inpatient Hospital Stay: Payer: BLUE CROSS/BLUE SHIELD

## 2017-12-27 ENCOUNTER — Inpatient Hospital Stay: Payer: BLUE CROSS/BLUE SHIELD | Admitting: Internal Medicine

## 2017-12-27 DIAGNOSIS — Z5112 Encounter for antineoplastic immunotherapy: Secondary | ICD-10-CM

## 2017-12-27 DIAGNOSIS — C3411 Malignant neoplasm of upper lobe, right bronchus or lung: Secondary | ICD-10-CM | POA: Diagnosis not present

## 2017-12-27 DIAGNOSIS — C3491 Malignant neoplasm of unspecified part of right bronchus or lung: Secondary | ICD-10-CM

## 2017-12-27 LAB — CMP (CANCER CENTER ONLY)
ALBUMIN: 4.1 g/dL (ref 3.5–5.0)
ALT: 99 U/L — AB (ref 0–55)
AST: 38 U/L — ABNORMAL HIGH (ref 5–34)
Alkaline Phosphatase: 72 U/L (ref 40–150)
Anion gap: 14 — ABNORMAL HIGH (ref 3–11)
BUN: 13 mg/dL (ref 7–26)
CHLORIDE: 103 mmol/L (ref 98–109)
CO2: 25 mmol/L (ref 22–29)
Calcium: 9.8 mg/dL (ref 8.4–10.4)
Creatinine: 0.94 mg/dL (ref 0.70–1.30)
GFR, Estimated: >60 mL/min (ref >60)
Glucose, Bld: 131 mg/dL (ref 70–140)
POTASSIUM: 3.6 mmol/L (ref 3.5–5.1)
Sodium: 142 mmol/L (ref 136–145)
Total Bilirubin: 0.9 mg/dL (ref 0.2–1.2)
Total Protein: 7.2 g/dL (ref 6.4–8.3)

## 2017-12-27 LAB — CBC WITH DIFFERENTIAL (CANCER CENTER ONLY)
Basophils Absolute: 0 10*3/uL (ref 0.0–0.1)
Basophils Relative: 1 %
Eosinophils Absolute: 0 10*3/uL (ref 0.0–0.5)
Eosinophils Relative: 1 %
HCT: 34.5 % — ABNORMAL LOW (ref 38.4–49.9)
HEMOGLOBIN: 12 g/dL — AB (ref 13.0–17.1)
LYMPHS ABS: 1.2 10*3/uL (ref 0.9–3.3)
LYMPHS PCT: 29 %
MCH: 31.7 pg (ref 27.2–33.4)
MCHC: 34.8 g/dL (ref 32.0–36.0)
MCV: 91.3 fL (ref 79.3–98.0)
Monocytes Absolute: 0.5 10*3/uL (ref 0.1–0.9)
Monocytes Relative: 12 %
NEUTROS PCT: 57 %
Neutro Abs: 2.5 10*3/uL (ref 1.5–6.5)
Platelet Count: 292 10*3/uL (ref 140–400)
RBC: 3.78 MIL/uL — AB (ref 4.20–5.82)
RDW: 17.5 % — ABNORMAL HIGH (ref 11.0–14.6)
WBC: 4.2 10*3/uL (ref 4.0–10.3)

## 2017-12-27 LAB — TSH: TSH: 2.999 u[IU]/mL (ref 0.320–4.118)

## 2017-12-27 NOTE — Progress Notes (Signed)
CBC/DIFF/CMP done 12/27/2017 at [facility].  In Epic.  Consent signed on 12/27/2017 and placed on chart .   Hibiclens with preop instructions given on 11/30/2017.

## 2017-12-29 ENCOUNTER — Ambulatory Visit (HOSPITAL_COMMUNITY)
Admission: RE | Admit: 2017-12-29 | Discharge: 2017-12-29 | Disposition: A | Discharge status: Home / Self Care | Payer: BLUE CROSS/BLUE SHIELD | Source: Ambulatory Visit | Attending: General Surgery | Admitting: General Surgery

## 2017-12-29 ENCOUNTER — Encounter (HOSPITAL_COMMUNITY): Payer: Self-pay | Admitting: *Deleted

## 2017-12-29 ENCOUNTER — Encounter (HOSPITAL_COMMUNITY)
Admission: RE | Disposition: A | Discharge status: Home / Self Care | Payer: Self-pay | Source: Ambulatory Visit | Attending: General Surgery

## 2017-12-29 ENCOUNTER — Ambulatory Visit (HOSPITAL_COMMUNITY): Payer: BLUE CROSS/BLUE SHIELD | Admitting: Certified Registered Nurse Anesthetist

## 2017-12-29 DIAGNOSIS — Z8249 Family history of ischemic heart disease and other diseases of the circulatory system: Secondary | ICD-10-CM | POA: Diagnosis not present

## 2017-12-29 DIAGNOSIS — I10 Essential (primary) hypertension: Secondary | ICD-10-CM | POA: Diagnosis not present

## 2017-12-29 DIAGNOSIS — Z7902 Long term (current) use of antithrombotics/antiplatelets: Secondary | ICD-10-CM | POA: Diagnosis not present

## 2017-12-29 DIAGNOSIS — Z7982 Long term (current) use of aspirin: Secondary | ICD-10-CM | POA: Diagnosis not present

## 2017-12-29 DIAGNOSIS — Z951 Presence of aortocoronary bypass graft: Secondary | ICD-10-CM | POA: Insufficient documentation

## 2017-12-29 DIAGNOSIS — I252 Old myocardial infarction: Secondary | ICD-10-CM | POA: Diagnosis not present

## 2017-12-29 DIAGNOSIS — K8012 Calculus of gallbladder with acute and chronic cholecystitis without obstruction: Secondary | ICD-10-CM | POA: Insufficient documentation

## 2017-12-29 DIAGNOSIS — C3490 Malignant neoplasm of unspecified part of unspecified bronchus or lung: Secondary | ICD-10-CM | POA: Insufficient documentation

## 2017-12-29 DIAGNOSIS — Z79899 Other long term (current) drug therapy: Secondary | ICD-10-CM | POA: Insufficient documentation

## 2017-12-29 DIAGNOSIS — K8020 Calculus of gallbladder without cholecystitis without obstruction: Secondary | ICD-10-CM | POA: Diagnosis present

## 2017-12-29 HISTORY — PX: CHOLECYSTECTOMY: SHX55

## 2017-12-29 HISTORY — PX: CHOLECYSTECTOMY, LAPAROSCOPIC: SHX56

## 2017-12-29 LAB — LIPID PANEL
Chol/HDL Ratio: 2.9 ratio (ref 0.0–5.0)
Cholesterol, Total: 115 mg/dL (ref 100–199)
HDL: 39 mg/dL — ABNORMAL LOW (ref >39)
LDL CALC: 51 mg/dL (ref 0–99)
TRIGLYCERIDES: 125 mg/dL (ref 0–149)
VLDL Cholesterol Cal: 25 mg/dL (ref 5–40)

## 2017-12-29 LAB — GLUCOSE, CAPILLARY: Glucose-Capillary: 106 mg/dL — ABNORMAL HIGH (ref 65–99)

## 2017-12-29 SURGERY — LAPAROSCOPIC CHOLECYSTECTOMY
Anesthesia: General | Site: Abdomen

## 2017-12-29 MED ORDER — SUGAMMADEX SODIUM 200 MG/2ML IV SOLN
INTRAVENOUS | Status: AC
  Filled 2017-12-29: qty 12

## 2017-12-29 MED ORDER — ONDANSETRON HCL 4 MG/2ML IJ SOLN
INTRAMUSCULAR | Status: AC
  Filled 2017-12-29: qty 2

## 2017-12-29 MED ORDER — TRAMADOL HCL 50 MG PO TABS: 50.0000 mg | ORAL_TABLET | Freq: Four times a day (QID) | ORAL | 0 refills | Status: DC | PRN

## 2017-12-29 MED ORDER — 0.9 % SODIUM CHLORIDE (POUR BTL) OPTIME
TOPICAL | Status: DC | PRN
  Administered 2017-12-29: 1000 mL

## 2017-12-29 MED ORDER — LIDOCAINE 2% (20 MG/ML) 5 ML SYRINGE
INTRAMUSCULAR | Status: DC | PRN
  Administered 2017-12-29: 60 mg via INTRAVENOUS

## 2017-12-29 MED ORDER — DEXAMETHASONE SODIUM PHOSPHATE 10 MG/ML IJ SOLN
INTRAMUSCULAR | Status: AC
  Filled 2017-12-29: qty 1

## 2017-12-29 MED ORDER — LACTATED RINGERS IV SOLN
INTRAVENOUS | Status: DC
  Administered 2017-12-29: 12:00:00 via INTRAVENOUS

## 2017-12-29 MED ORDER — OXYCODONE HCL 5 MG PO TABS
5.0000 mg | ORAL_TABLET | Freq: Once | ORAL | Status: AC
  Administered 2017-12-29: 5 mg via ORAL

## 2017-12-29 MED ORDER — OXYCODONE HCL 5 MG/5ML PO SOLN: 5.0000 mg | Freq: Once | ORAL | Status: DC | PRN

## 2017-12-29 MED ORDER — ONDANSETRON HCL 4 MG/2ML IJ SOLN
INTRAMUSCULAR | Status: DC | PRN
  Administered 2017-12-29: 4 mg via INTRAVENOUS

## 2017-12-29 MED ORDER — LIDOCAINE 2% (20 MG/ML) 5 ML SYRINGE
INTRAMUSCULAR | Status: AC
  Filled 2017-12-29: qty 5

## 2017-12-29 MED ORDER — ONDANSETRON HCL 4 MG/2ML IJ SOLN: 4.0000 mg | Freq: Four times a day (QID) | INTRAMUSCULAR | Status: DC | PRN

## 2017-12-29 MED ORDER — LIDOCAINE HCL 2 % IJ SOLN
INTRAMUSCULAR | Status: AC
  Filled 2017-12-29: qty 40

## 2017-12-29 MED ORDER — KETAMINE HCL 10 MG/ML IJ SOLN
INTRAMUSCULAR | Status: DC | PRN
  Administered 2017-12-29: 40 mg via INTRAVENOUS

## 2017-12-29 MED ORDER — CEFAZOLIN SODIUM-DEXTROSE 2-4 GM/100ML-% IV SOLN
2.0000 g | INTRAVENOUS | Status: AC
  Administered 2017-12-29: 2 g via INTRAVENOUS
  Filled 2017-12-29: qty 100

## 2017-12-29 MED ORDER — SUGAMMADEX SODIUM 200 MG/2ML IV SOLN
INTRAVENOUS | Status: DC | PRN
Start: 2017-12-29 — End: 2017-12-29
  Administered 2017-12-29: 200 mg via INTRAVENOUS

## 2017-12-29 MED ORDER — HYDROMORPHONE HCL 1 MG/ML IJ SOLN
INTRAMUSCULAR | Status: AC
  Filled 2017-12-29: qty 2

## 2017-12-29 MED ORDER — LABETALOL HCL 5 MG/ML IV SOLN
INTRAVENOUS | Status: AC
  Filled 2017-12-29: qty 4

## 2017-12-29 MED ORDER — FENTANYL CITRATE (PF) 250 MCG/5ML IJ SOLN
INTRAMUSCULAR | Status: DC | PRN
  Administered 2017-12-29: 50 ug via INTRAVENOUS
  Administered 2017-12-29: 100 ug via INTRAVENOUS
  Administered 2017-12-29 (×2): 50 ug via INTRAVENOUS

## 2017-12-29 MED ORDER — OXYCODONE HCL 5 MG PO TABS
ORAL_TABLET | ORAL | Status: AC
  Filled 2017-12-29: qty 1

## 2017-12-29 MED ORDER — MIDAZOLAM HCL 2 MG/2ML IJ SOLN
INTRAMUSCULAR | Status: AC
  Filled 2017-12-29: qty 2

## 2017-12-29 MED ORDER — FENTANYL CITRATE (PF) 250 MCG/5ML IJ SOLN
INTRAMUSCULAR | Status: AC
  Filled 2017-12-29: qty 5

## 2017-12-29 MED ORDER — METOPROLOL TARTRATE 12.5 MG HALF TABLET
12.5000 mg | ORAL_TABLET | Freq: Once | ORAL | Status: AC
  Administered 2017-12-29: 12.5 mg via ORAL
  Filled 2017-12-29: qty 1

## 2017-12-29 MED ORDER — CHLORHEXIDINE GLUCONATE CLOTH 2 % EX PADS: 6.0000 | MEDICATED_PAD | Freq: Once | CUTANEOUS | Status: DC

## 2017-12-29 MED ORDER — PROPOFOL 10 MG/ML IV BOLUS
INTRAVENOUS | Status: AC
  Filled 2017-12-29: qty 20

## 2017-12-29 MED ORDER — ROCURONIUM BROMIDE 10 MG/ML (PF) SYRINGE
PREFILLED_SYRINGE | INTRAVENOUS | Status: DC | PRN
  Administered 2017-12-29: 50 mg via INTRAVENOUS

## 2017-12-29 MED ORDER — TRAMADOL HCL 50 MG PO TABS
ORAL_TABLET | ORAL | Status: AC
  Filled 2017-12-29: qty 1

## 2017-12-29 MED ORDER — GABAPENTIN 300 MG PO CAPS
300.0000 mg | ORAL_CAPSULE | ORAL | Status: AC
  Administered 2017-12-29: 300 mg via ORAL
  Filled 2017-12-29: qty 1

## 2017-12-29 MED ORDER — LACTATED RINGERS IR SOLN
Status: DC | PRN
  Administered 2017-12-29: 1000 mL

## 2017-12-29 MED ORDER — LIDOCAINE 2% (20 MG/ML) 5 ML SYRINGE
INTRAMUSCULAR | Status: DC | PRN
  Administered 2017-12-29: 1.5 mg/kg/h via INTRAVENOUS

## 2017-12-29 MED ORDER — BUPIVACAINE-EPINEPHRINE 0.25% -1:200000 IJ SOLN
INTRAMUSCULAR | Status: DC | PRN
  Administered 2017-12-29: 10 mL

## 2017-12-29 MED ORDER — ROCURONIUM BROMIDE 10 MG/ML (PF) SYRINGE
PREFILLED_SYRINGE | INTRAVENOUS | Status: AC
  Filled 2017-12-29: qty 5

## 2017-12-29 MED ORDER — HYDROMORPHONE HCL 1 MG/ML IJ SOLN
0.2500 mg | INTRAMUSCULAR | Status: DC | PRN
  Administered 2017-12-29: 0.25 mg via INTRAVENOUS
  Administered 2017-12-29 (×2): 0.5 mg via INTRAVENOUS
  Administered 2017-12-29: 0.25 mg via INTRAVENOUS

## 2017-12-29 MED ORDER — MIDAZOLAM HCL 2 MG/2ML IJ SOLN
1.0000 mg | Freq: Once | INTRAMUSCULAR | Status: AC
  Administered 2017-12-29: 1 mg via INTRAVENOUS

## 2017-12-29 MED ORDER — OXYCODONE HCL 5 MG PO TABS: 5.0000 mg | ORAL_TABLET | Freq: Once | ORAL | Status: DC | PRN

## 2017-12-29 MED ORDER — TRAMADOL HCL 50 MG PO TABS
50.0000 mg | ORAL_TABLET | Freq: Once | ORAL | Status: AC
  Administered 2017-12-29: 50 mg via ORAL

## 2017-12-29 MED ORDER — LABETALOL HCL 5 MG/ML IV SOLN
10.0000 mg | INTRAVENOUS | Status: DC | PRN
  Administered 2017-12-29: 10 mg via INTRAVENOUS

## 2017-12-29 MED ORDER — DEXAMETHASONE SODIUM PHOSPHATE 10 MG/ML IJ SOLN
INTRAMUSCULAR | Status: DC | PRN
  Administered 2017-12-29: 10 mg via INTRAVENOUS

## 2017-12-29 MED ORDER — PROPOFOL 10 MG/ML IV BOLUS
INTRAVENOUS | Status: DC | PRN
  Administered 2017-12-29: 150 mg via INTRAVENOUS

## 2017-12-29 MED ORDER — MIDAZOLAM HCL 5 MG/5ML IJ SOLN
INTRAMUSCULAR | Status: DC | PRN
  Administered 2017-12-29: 2 mg via INTRAVENOUS

## 2017-12-29 MED ORDER — PHENYLEPHRINE 40 MCG/ML (10ML) SYRINGE FOR IV PUSH (FOR BLOOD PRESSURE SUPPORT)
PREFILLED_SYRINGE | INTRAVENOUS | Status: DC | PRN
  Administered 2017-12-29: 80 ug via INTRAVENOUS
  Administered 2017-12-29: 120 ug via INTRAVENOUS

## 2017-12-29 MED ORDER — BUPIVACAINE-EPINEPHRINE (PF) 0.25% -1:200000 IJ SOLN
INTRAMUSCULAR | Status: AC
  Filled 2017-12-29: qty 30

## 2017-12-29 MED ORDER — CELECOXIB 200 MG PO CAPS
200.0000 mg | ORAL_CAPSULE | ORAL | Status: AC
  Administered 2017-12-29: 200 mg via ORAL
  Filled 2017-12-29: qty 1

## 2017-12-29 MED ORDER — KETAMINE HCL 10 MG/ML IJ SOLN
INTRAMUSCULAR | Status: AC
  Filled 2017-12-29: qty 1

## 2017-12-29 MED ORDER — ACETAMINOPHEN 500 MG PO TABS
1000.0000 mg | ORAL_TABLET | ORAL | Status: AC
  Administered 2017-12-29: 1000 mg via ORAL
  Filled 2017-12-29: qty 2

## 2017-12-29 SURGICAL SUPPLY — 35 items
APPLIER CLIP 5 13 M/L LIGAMAX5 (MISCELLANEOUS)
BENZOIN TINCTURE PRP APPL 2/3 (GAUZE/BANDAGES/DRESSINGS) IMPLANT
CABLE HIGH FREQUENCY MONO STRZ (ELECTRODE) ×2 IMPLANT
CHLORAPREP W/TINT 26ML (MISCELLANEOUS) ×2 IMPLANT
CLIP APPLIE 5 13 M/L LIGAMAX5 (MISCELLANEOUS) IMPLANT
CLIP VESOLOCK MED LG 6/CT (CLIP) ×4 IMPLANT
COVER MAYO STAND STRL (DRAPES) IMPLANT
COVER TRANSDUCER ULTRASND (DRAPES) ×2 IMPLANT
DECANTER SPIKE VIAL GLASS SM (MISCELLANEOUS) ×2 IMPLANT
DERMABOND ADVANCED (GAUZE/BANDAGES/DRESSINGS) ×1
DERMABOND ADVANCED .7 DNX12 (GAUZE/BANDAGES/DRESSINGS) ×1 IMPLANT
DRAPE C-ARM 42X120 X-RAY (DRAPES) IMPLANT
ELECT REM PT RETURN 15FT ADLT (MISCELLANEOUS) ×2 IMPLANT
GAUZE SPONGE 2X2 8PLY STRL LF (GAUZE/BANDAGES/DRESSINGS) IMPLANT
GLOVE BIO SURGEON STRL SZ7.5 (GLOVE) ×2 IMPLANT
GOWN STRL REUS W/TWL XL LVL3 (GOWN DISPOSABLE) ×4 IMPLANT
GRASPER SUT TROCAR 14GX15 (MISCELLANEOUS) ×2 IMPLANT
HEMOSTAT SURGICEL 4X8 (HEMOSTASIS) IMPLANT
KIT BASIN OR (CUSTOM PROCEDURE TRAY) ×2 IMPLANT
NEEDLE INSUFFLATION 14GA 120MM (NEEDLE) ×2 IMPLANT
POUCH RETRIEVAL ECOSAC 10 (ENDOMECHANICALS) ×1 IMPLANT
POUCH RETRIEVAL ECOSAC 10MM (ENDOMECHANICALS) ×1
SCISSORS LAP 5X35 DISP (ENDOMECHANICALS) ×2 IMPLANT
SET CHOLANGIOGRAPH MIX (MISCELLANEOUS) IMPLANT
SET IRRIG TUBING LAPAROSCOPIC (IRRIGATION / IRRIGATOR) ×2 IMPLANT
SLEEVE XCEL OPT CAN 5 100 (ENDOMECHANICALS) ×2 IMPLANT
SPONGE GAUZE 2X2 STER 10/PKG (GAUZE/BANDAGES/DRESSINGS)
STRIP CLOSURE SKIN 1/2X4 (GAUZE/BANDAGES/DRESSINGS) IMPLANT
SUT MNCRL AB 4-0 PS2 18 (SUTURE) ×2 IMPLANT
TOWEL OR 17X26 10 PK STRL BLUE (TOWEL DISPOSABLE) ×2 IMPLANT
TOWEL OR NON WOVEN STRL DISP B (DISPOSABLE) ×2 IMPLANT
TRAY LAPAROSCOPIC (CUSTOM PROCEDURE TRAY) ×2 IMPLANT
TROCAR BLADELESS OPT 5 100 (ENDOMECHANICALS) ×2 IMPLANT
TROCAR XCEL NON-BLD 11X100MML (ENDOMECHANICALS) ×2 IMPLANT
TUBING INSUF HEATED (TUBING) ×2 IMPLANT

## 2017-12-29 NOTE — Anesthesia Procedure Notes (Signed)
Procedure Name: Intubation Date/Time: 12/29/2017 1:26 PM Performed by: Mitzie Na, CRNA Pre-anesthesia Checklist: Patient identified, Emergency Drugs available, Suction available, Patient being monitored and Timeout performed Patient Re-evaluated:Patient Re-evaluated prior to induction Oxygen Delivery Method: Circle system utilized Preoxygenation: Pre-oxygenation with 100% oxygen Induction Type: IV induction Ventilation: Mask ventilation without difficulty Laryngoscope Size: Mac and 4 Grade View: Grade II Tube type: Oral Tube size: 7.5 mm Number of attempts: 2 Airway Equipment and Method: Stylet Placement Confirmation: ETT inserted through vocal cords under direct vision,  positive ETCO2 and breath sounds checked- equal and bilateral Secured at: 22 cm Tube secured with: Tape Dental Injury: Teeth and Oropharynx as per pre-operative assessment

## 2017-12-29 NOTE — Transfer of Care (Signed)
Immediate Anesthesia Transfer of Care Note  Patient: Anthony [name]  Procedure(s) Performed: LAPAROSCOPIC CHOLECYSTECTOMY (N/A Abdomen)  Patient Location: PACU  Anesthesia Type:General  Level of Consciousness: awake, alert , oriented and patient cooperative  Airway & Oxygen Therapy: Patient Spontanous Breathing and Patient connected to face mask oxygen  Post-op Assessment: Report given to RN, Post -op Vital signs reviewed and stable and Patient moving all extremities  Post vital signs: Reviewed and stable  Last Vitals:  Vitals Value Taken Time  BP 157/105 12/29/2017  2:45 PM  Temp    Pulse 84 12/29/2017  2:46 PM  Resp 14 12/29/2017  2:46 PM  SpO2 100 % 12/29/2017  2:46 PM  Vitals shown include unvalidated device data.  Last Pain:  Vitals:   12/29/17 1137  TempSrc:   PainSc: 0-No pain         Complications: No apparent anesthesia complications

## 2017-12-29 NOTE — Discharge Instructions (Signed)
[facility] ______[facility] °LAPAROSCOPIC SURGERY: POST OP INSTRUCTIONS °Always review your discharge instruction sheet given to you by the facility where your surgery was performed. °IF YOU HAVE DISABILITY OR FAMILY LEAVE FORMS, YOU MUST BRING THEM TO THE OFFICE FOR PROCESSING.   °DO NOT GIVE THEM TO YOUR DOCTOR. ° °1. A prescription for pain medication may be given to you upon discharge.  Take your pain medication as prescribed, if needed.  If narcotic pain medicine is not needed, then you may take acetaminophen (Tylenol) or ibuprofen (Advil) as needed. °2. Take your usually prescribed medications unless otherwise directed. °3. If you need a refill on your pain medication, please contact your pharmacy.  They will contact our office to request authorization. Prescriptions will not be filled after 5pm or on week-ends. °4. You should follow a light diet the first few days after arrival home, such as soup and crackers, etc.  Be sure to include lots of fluids daily. °5. Most patients will experience some swelling and bruising in the area of the incisions.  Ice packs will help.  Swelling and bruising can take several days to resolve.  °6. It is common to experience some constipation if taking pain medication after surgery.  Increasing fluid intake and taking a stool softener (such as Colace) will usually help or prevent this problem from occurring.  A mild laxative (Milk of Magnesia or Miralax) should be taken according to package instructions if there are no bowel movements after 48 hours. °7. Unless discharge instructions indicate otherwise, you may remove your bandages 24-48 hours after surgery, and you may shower at that time.  You may have steri-strips (small skin tapes) in place directly over the incision.  These strips should be left on the skin for 7-10 days.  If your surgeon used skin glue on the incision, you may shower in 24 hours.  The glue will flake off over the next 2-3 weeks.  Any sutures or  staples will be removed at the office during your follow-up visit. °8. ACTIVITIES:  You may resume regular (light) daily activities beginning the next day--such as daily self-care, walking, climbing stairs--gradually increasing activities as tolerated.  You may have sexual intercourse when it is comfortable.  Refrain from any heavy lifting or straining until approved by your doctor. °a. You may drive when you are no longer taking prescription pain medication, you can comfortably wear a seatbelt, and you can safely maneuver your car and apply brakes. °b. RETURN TO WORK:  __________________________________________________________ °9. You should see your doctor in the office for a follow-up appointment approximately 2-3 weeks after your surgery.  Make sure that you call for this appointment within a day or two after you arrive home to insure a convenient appointment time. °10. OTHER INSTRUCTIONS: __________________________________________________________________________________________________________________________ __________________________________________________________________________________________________________________________ °WHEN TO CALL YOUR DOCTOR: °1. Fever over 101.0 °2. Inability to urinate °3. Continued bleeding from incision. °4. Increased pain, redness, or drainage from the incision. °5. Increasing abdominal pain ° °The clinic staff is available to answer your questions during regular business hours.  Please don’t hesitate to call and ask to speak to one of the nurses for clinical concerns.  If you have a medical emergency, go to the nearest emergency room or call 911.  A surgeon from [facility] is always on call at the hospital. °[address], [address], [place], [place]  [address] ? [address] [address], [place], [place]   [address] °[phone] ? [phone] ? FAX [phone] °Web site:   [email or web address]  General Anesthesia, Adult, Care After These instructions  provide you with information about caring for yourself after your procedure. Your health care provider may also give you more specific instructions. Your treatment has been planned according to current medical practices, but problems sometimes occur. Call your health care provider if you have any problems or questions after your procedure. What can I expect after the procedure? After the procedure, it is common to have:  Vomiting.  A sore throat.  Mental slowness.  It is common to feel:  Nauseous.  Cold or shivery.  Sleepy.  Tired.  Sore or achy, even in parts of your body where you did not have surgery.  Follow these instructions at home: For at least 24 hours after the procedure:  Do not: ? Participate in activities where you could fall or become injured. ? Drive. ? Use heavy machinery. ? Drink alcohol. ? Take sleeping pills or medicines that cause drowsiness. ? Make important decisions or sign legal documents. ? Take care of children on your own.  Rest. Eating and drinking  If you vomit, drink water, juice, or soup when you can drink without vomiting.  Drink enough fluid to keep your urine clear or pale yellow.  Make sure you have little or no nausea before eating solid foods.  Follow the diet recommended by your health care provider. General instructions  Have a responsible adult stay with you until you are awake and alert.  Return to your normal activities as told by your health care provider. Ask your health care provider what activities are safe for you.  Take over-the-counter and prescription medicines only as told by your health care provider.  If you smoke, do not smoke without supervision.  Keep all follow-up visits as told by your health care provider. This is important. Contact a health care provider if:  You continue to have nausea or vomiting at home, and medicines are not helpful.  You cannot drink fluids or start eating again.  You cannot  urinate after 8-12 hours.  You develop a skin rash.  You have fever.  You have increasing redness at the site of your procedure. Get help right away if:  You have difficulty breathing.  You have chest pain.  You have unexpected bleeding.  You feel that you are having a life-threatening or urgent problem. This information is not intended to replace advice given to you by your health care provider. Make sure you discuss any questions you have with your health care provider. Document Released: 10/24/2000 Document Revised: 12/21/2015 Document Reviewed: 07/02/2015 Elsevier Interactive Patient Education  [employer].

## 2017-12-29 NOTE — Progress Notes (Signed)
Spoke with Dr. Rosendo Gros pt is requesting a stronger pain medicine to help ease the pain so he can sleep. Per Dr. Rosendo Gros patient can take tylenol with the Tramadol at home. Patient agrees to the treatment plan. Will continue to monitor.

## 2017-12-29 NOTE — Progress Notes (Addendum)
Notified Dr. Marcie Bal pt is experiencing 7/10 pain after the tramadol. Requested MDA to lay eyes on patient considering his significant cardiac history. Dr. Marcie Bal looked a patient and feels is pain is coming from the laparoscopic gas injected during surgery. MDA does not want an EKG. Will give 5mg  Oxycodone per MDA. Will continue to monitor closely.

## 2017-12-29 NOTE — Op Note (Signed)
12/29/2017  2:34 PM  PATIENT:  Anthony [name]  47 y.o. male  PRE-OPERATIVE DIAGNOSIS:  GALLSTONES  POST-OPERATIVE DIAGNOSIS:  GALLSTONES  PROCEDURE:  Procedure(s): LAPAROSCOPIC CHOLECYSTECTOMY (N/A)  SURGEON:  Surgeon(s) and Role:    * Ralene Ok, MD - Primary  ANESTHESIA:   local and general  EBL:  15 mL   BLOOD ADMINISTERED:none  DRAINS: none   LOCAL MEDICATIONS USED:  BUPIVICAINE   SPECIMEN:  Source of Specimen:  gallbladder  DISPOSITION OF SPECIMEN:  PATHOLOGY  COUNTS:  YES  TOURNIQUET:  * No tourniquets in log *  DICTATION: .Dragon Dictation The patient was taken to the operating and placed in the supine position with bilateral SCDs in place. The patient was prepped and draped in the usual sterile fashion. A time out was called and all facts were verified. A pneumoperitoneum was obtained via A Veress needle technique to a pressure of 89mm of mercury.  A 58mm trochar was then placed in the right upper quadrant under visualization, and there were no injuries to any abdominal organs. A 11 mm port was then placed in the umbilical region after infiltrating with local anesthesia under direct visualization. A second and third epigastric port and right lower quadrant port placement under direct visualization, respectively. The liver was very fatty and large. The gallbladder was identified and retracted, the peritoneum was then sharply dissected from the gallbladder and this dissection was carried down to Calot's triangle. The gallbladder was identified and stripped away circumferentially and seen going into the gallbladder 360, the critical angle was obtained.  2 clips were placed proximally one distally and the cystic duct transected. The cystic artery was identified and 2 clips placed proximally and one distally and transected. We then proceeded to remove the gallbladder off the hepatic fossa with Bovie cautery. A retrieval bag was then placed in the abdomen and gallbladder  placed in the bag. The hepatic fossa was then reexamined and hemostasis was achieved with Bovie cautery and was excellent at the end of the case. The subhepatic fossa and perihepatic fossa was then irrigated until the effluent was clear.  The gallbladder and bag were removed from the abdominal cavity. The 11 mm trocar fascia was reapproximated with the Endo Close #1 Vicryl x2. The pneumoperitoneum was evacuated and all trochars removed under direct visulalization. The skin was then closed with 4-0 Monocryl and the skin dressed with Dermabond. The patient was awaken from general anesthesia and taken to the recovery room in stable condition.   PLAN OF CARE: Discharge to home after PACU  PATIENT DISPOSITION:  PACU - hemodynamically stable.   Delay start of Pharmacological VTE agent (>24hrs) due to surgical blood loss or risk of bleeding: not applicable

## 2017-12-29 NOTE — Anesthesia Postprocedure Evaluation (Signed)
Anesthesia Post Note  Patient: Anthony [name]  Procedure(s) Performed: LAPAROSCOPIC CHOLECYSTECTOMY (N/A Abdomen)     Patient location during evaluation: PACU Anesthesia Type: General Level of consciousness: awake and alert Pain management: pain level controlled Vital Signs Assessment: post-procedure vital signs reviewed and stable Respiratory status: spontaneous breathing, nonlabored ventilation, respiratory function stable and patient connected to nasal cannula oxygen Cardiovascular status: blood pressure returned to baseline and stable Postop Assessment: no apparent nausea or vomiting Anesthetic complications: no    Last Vitals:  Vitals:   12/29/17 1626 12/29/17 1710  BP: (!) 125/91 (!) 133/93  Pulse: 69 66  Resp: 14 14  Temp:    SpO2: 99% 100%    Last Pain:  Vitals:   12/29/17 1710  TempSrc:   PainSc: [place]

## 2017-12-29 NOTE — Interval H&P Note (Signed)
History and Physical Interval Note:  12/29/2017 1:08 PM  Anthony [name]  has presented today for surgery, with the diagnosis of GALLSTONES  The various methods of treatment have been discussed with the patient and family. After consideration of risks, benefits and other options for treatment, the patient has consented to  Procedure(s): LAPAROSCOPIC CHOLECYSTECTOMY (N/A) as a surgical intervention .  The patient's history has been reviewed, patient examined, no change in status, stable for surgery.  I have reviewed the patient's chart and labs.  Questions were answered to the patient's satisfaction.     Rosario Jacks., Anne Hahn

## 2017-12-29 NOTE — Anesthesia Preprocedure Evaluation (Signed)
Anesthesia Evaluation  Patient identified by MRN, date of birth, ID band Patient awake    Reviewed: Allergy & Precautions, H&P , NPO status , Patient's Chart, lab work & pertinent test results  Airway Mallampati: II   Neck ROM: full    Dental   Pulmonary  Lung CA   breath sounds clear to auscultation       Cardiovascular + CAD, + Past MI and + CABG   Rhythm:regular Rate:Normal     Neuro/Psych    GI/Hepatic GERD  ,  Endo/Other    Renal/GU      Musculoskeletal   Abdominal   Peds  Hematology   Anesthesia Other Findings   Reproductive/Obstetrics                           Anesthesia Physical Anesthesia Plan  ASA: III  Anesthesia Plan: General   Post-op Pain Management:    Induction: Intravenous  PONV Risk Score and Plan: 2 and Ondansetron, Dexamethasone, Midazolam and Treatment may vary due to age or medical condition  Airway Management Planned: Oral ETT  Additional Equipment:   Intra-op Plan:   Post-operative Plan: Extubation in OR  Informed Consent: I have reviewed the patients History and Physical, chart, labs and discussed the procedure including the risks, benefits and alternatives for the proposed anesthesia with the patient or authorized representative who has indicated his/her understanding and acceptance.     Plan Discussed with: CRNA, Anesthesiologist and Surgeon  Anesthesia Plan Comments:         Anesthesia Quick Evaluation

## 2017-12-30 ENCOUNTER — Encounter (HOSPITAL_COMMUNITY): Payer: Self-pay | Admitting: General Surgery

## 2018-01-03 ENCOUNTER — Other Ambulatory Visit: Payer: Self-pay | Admitting: Cardiology

## 2018-01-03 ENCOUNTER — Other Ambulatory Visit: Payer: BLUE CROSS/BLUE SHIELD

## 2018-01-04 ENCOUNTER — Telehealth: Payer: Self-pay | Admitting: Interventional Cardiology

## 2018-01-04 DIAGNOSIS — I2510 Atherosclerotic heart disease of native coronary artery without angina pectoris: Secondary | ICD-10-CM

## 2018-01-04 MED ORDER — ATORVASTATIN CALCIUM 40 MG PO TABS: 40.0000 mg | ORAL_TABLET | Freq: Every day | ORAL | 3 refills | Status: DC

## 2018-01-04 NOTE — Telephone Encounter (Signed)
Notes recorded by Consuelo Pandy, PA-C on 01/04/2018 at 8:09 AM EDT Covering inbox for Cecilie Kicks. Lipids look great. LDL (bad cholesterol) is much better and now at recommended goal. LDL has come down from 129 to 51 with Lipitor. I also reviewed CMP done at the [facility] on 12/27/17. His liver enzymes are slightly elevated. Recommend reducing Lipitor from 80 mg to 40 mg and recheck HFTs and repeat lipids in 4 weeks.     Spoke with patient at this time and reviewed results. He will start taking atorvastatin 40 mg daily and come back for fasting liver and lipids on 02/02/18.

## 2018-01-04 NOTE — Telephone Encounter (Signed)
Pt calling and returning call to nurse.  Please call after 11am pt is in a meeting. Please call pt.

## 2018-01-10 ENCOUNTER — Encounter: Payer: Self-pay | Admitting: *Deleted

## 2018-01-10 ENCOUNTER — Other Ambulatory Visit: Payer: BLUE CROSS/BLUE SHIELD

## 2018-01-10 NOTE — Progress Notes (Signed)
[facility] CLINICAL SOCIAL WORK PSYCHOSOCIAL ASSESSMENT   Clinical Social Work was referred by patient for assessment of psychosocial needs.  Clinical Social Worker met with patient  to offer support and assess for needs.   Practical Problems: None Identified    Employment:  currently employed  Source of Income: Employment   Family Problems:  yes Concerns caring for family needs: yes    Emotional Problems: yes  Concerns of Adjustment to Diagnosis/Treatment: yes   Current Symptoms of Anxiety: yes   Current Symptoms of Depression: yes  Safety/Risk Concerns: no   Mental Status Exam Orientation: person, place, and time Affect: tearful Thought: normal      Spiritual/Religious: None identified  Living Situation: joint custody of two children, ages 38 and 71  Functional Status: Independent                                                                                               Strengths and Barriers To Treatment:   Patient Coping Strengths:   Supportive Relationships, Friends, Conservator, museum/gallery and Able to CommunicateEffectively,                                                                                               Identified Problems/Needs and Barriers to Care:  Adjustment to Illness, Emotional/Behavioral/Cognitive and Coping/Communication  Counseling and Social Work Interventions and Recommendations:   Brief Counseling/Psychotherapy, Problem-solving teaching/coping strategies and Consult MD   Impressions/Plan:  Patient reported main reason for requesting counseling services was due to recent outbursts at his children.  He shared he recently "snaps" at them very easily and has made them fearful of him.  CSW first explored causes of outbursts and current healthy coping strategies utilized by patient.  CSW and patient explored themes of loss of sense of control, overcoming fear and suppressed emotions, and living with uncertainty.  CSW encouraged patient  to focus on processing his own grief/emotions and develop healthy coping skills to manage emotions.  Provided patient with strategies to contain emotions/cope with fears hand out.  CSW discussed difficulty distinguishing symptoms of depression vs side effects of treatment (such as fatigue, poor appetite). Patient completed PHQ-9 (Personal Health Questionnaire Depression Scale) and scored a 19 indicating 'moderately severe depression'(will scan in to patient medical record).  Patient reported little interest in doing things, feeling down, trouble falling asleep, feeling tired, poor appetite, trouble concentrating, and feeling as though he's let his family down. Patient exhibits signs of situational depression- will discuss with MD.  CSW encouraged patient to practice mindfulness guided imagery exercise prior to going to bed. (provided [place] guided meditation CD).  CSW provided patient with a journal and instructed him to write down racing thoughts throughout the day and when  having difficulty sleeping.  Patient plans to attend one physical wellness activity- discussed joining a gym or participating in a free program such as [employer] hiking class, yoga, or taichi class.  Patient scheduled to meet with CSW for follow up counseling session 01/24/18.  Kennith Center, LCSW

## 2018-01-17 ENCOUNTER — Inpatient Hospital Stay (HOSPITAL_BASED_OUTPATIENT_CLINIC_OR_DEPARTMENT_OTHER): Payer: BLUE CROSS/BLUE SHIELD | Admitting: Nurse Practitioner

## 2018-01-17 ENCOUNTER — Encounter: Payer: Self-pay | Admitting: Nurse Practitioner

## 2018-01-17 ENCOUNTER — Telehealth: Payer: Self-pay | Admitting: Internal Medicine

## 2018-01-17 ENCOUNTER — Inpatient Hospital Stay: Payer: BLUE CROSS/BLUE SHIELD | Attending: Internal Medicine

## 2018-01-17 ENCOUNTER — Inpatient Hospital Stay: Payer: BLUE CROSS/BLUE SHIELD

## 2018-01-17 VITALS — BP 110/80 | HR 85 | Temp 98.2°F | Resp 18 | Ht 70.0 in | Wt 197.3 lb

## 2018-01-17 DIAGNOSIS — C3491 Malignant neoplasm of unspecified part of right bronchus or lung: Secondary | ICD-10-CM

## 2018-01-17 DIAGNOSIS — K1379 Other lesions of oral mucosa: Secondary | ICD-10-CM | POA: Insufficient documentation

## 2018-01-17 DIAGNOSIS — C778 Secondary and unspecified malignant neoplasm of lymph nodes of multiple regions: Secondary | ICD-10-CM | POA: Insufficient documentation

## 2018-01-17 DIAGNOSIS — F329 Major depressive disorder, single episode, unspecified: Secondary | ICD-10-CM | POA: Diagnosis not present

## 2018-01-17 DIAGNOSIS — C3411 Malignant neoplasm of upper lobe, right bronchus or lung: Secondary | ICD-10-CM

## 2018-01-17 DIAGNOSIS — Z5112 Encounter for antineoplastic immunotherapy: Secondary | ICD-10-CM

## 2018-01-17 DIAGNOSIS — Z5111 Encounter for antineoplastic chemotherapy: Secondary | ICD-10-CM | POA: Diagnosis not present

## 2018-01-17 DIAGNOSIS — C787 Secondary malignant neoplasm of liver and intrahepatic bile duct: Secondary | ICD-10-CM | POA: Insufficient documentation

## 2018-01-17 DIAGNOSIS — C7951 Secondary malignant neoplasm of bone: Secondary | ICD-10-CM

## 2018-01-17 LAB — CBC WITH DIFFERENTIAL (CANCER CENTER ONLY)
BASOS ABS: 0 10*3/uL (ref 0.0–0.1)
Basophils Relative: 1 %
EOS ABS: 0 10*3/uL (ref 0.0–0.5)
EOS PCT: 0 %
HCT: 34.6 % — ABNORMAL LOW (ref 38.4–49.9)
Hemoglobin: 12.1 g/dL — ABNORMAL LOW (ref 13.0–17.1)
Lymphocytes Relative: 17 %
Lymphs Abs: 1 10*3/uL (ref 0.9–3.3)
MCH: 32.5 pg (ref 27.2–33.4)
MCHC: 35 g/dL (ref 32.0–36.0)
MCV: 92.9 fL (ref 79.3–98.0)
Monocytes Absolute: 0.3 10*3/uL (ref 0.1–0.9)
Monocytes Relative: 4 %
Neutro Abs: 4.7 10*3/uL (ref 1.5–6.5)
Neutrophils Relative %: 78 %
PLATELETS: 260 10*3/uL (ref 140–400)
RBC: 3.72 MIL/uL — AB (ref 4.20–5.82)
RDW: 16.9 % — ABNORMAL HIGH (ref 11.0–14.6)
WBC: 6 10*3/uL (ref 4.0–10.3)

## 2018-01-17 LAB — CMP (CANCER CENTER ONLY)
ALT: 33 U/L (ref 0–55)
AST: 18 U/L (ref 5–34)
Albumin: 4.2 g/dL (ref 3.5–5.0)
Alkaline Phosphatase: 64 U/L (ref 40–150)
Anion gap: 10 (ref 3–11)
BILIRUBIN TOTAL: 0.7 mg/dL (ref 0.2–1.2)
BUN: 12 mg/dL (ref 7–26)
CO2: 24 mmol/L (ref 22–29)
Calcium: 10.1 mg/dL (ref 8.4–10.4)
Chloride: 106 mmol/L (ref 98–109)
Creatinine: 0.88 mg/dL (ref 0.70–1.30)
GFR, Est AFR Am: >60 mL/min (ref >60)
Glucose, Bld: 127 mg/dL (ref 70–140)
POTASSIUM: 3.6 mmol/L (ref 3.5–5.1)
Sodium: 140 mmol/L (ref 136–145)
TOTAL PROTEIN: 7 g/dL (ref 6.4–8.3)

## 2018-01-17 LAB — TSH: TSH: 0.314 u[IU]/mL — ABNORMAL LOW (ref 0.320–4.118)

## 2018-01-17 MED ORDER — DEXAMETHASONE SODIUM PHOSPHATE 10 MG/ML IJ SOLN
10.0000 mg | Freq: Once | INTRAMUSCULAR | Status: AC
  Administered 2018-01-17: 10 mg via INTRAVENOUS

## 2018-01-17 MED ORDER — MIRTAZAPINE 30 MG PO TABS: 30.0000 mg | ORAL_TABLET | Freq: Every day | ORAL | 0 refills | Status: DC

## 2018-01-17 MED ORDER — SODIUM CHLORIDE 0.9 % IV SOLN
506.0000 mg/m2 | Freq: Once | INTRAVENOUS | Status: AC
  Administered 2018-01-17: 1100 mg via INTRAVENOUS
  Filled 2018-01-17: qty 40

## 2018-01-17 MED ORDER — DEXAMETHASONE SODIUM PHOSPHATE 10 MG/ML IJ SOLN
INTRAMUSCULAR | Status: AC
  Filled 2018-01-17: qty 1

## 2018-01-17 MED ORDER — SODIUM CHLORIDE 0.9 % IV SOLN
Freq: Once | INTRAVENOUS | Status: AC
  Administered 2018-01-17: 11:00:00 via INTRAVENOUS

## 2018-01-17 MED ORDER — ONDANSETRON HCL 4 MG/2ML IJ SOLN
INTRAMUSCULAR | Status: AC
  Filled 2018-01-17: qty 4

## 2018-01-17 MED ORDER — FOLIC ACID 1 MG PO TABS: 1.0000 mg | ORAL_TABLET | Freq: Every day | ORAL | 0 refills | Status: DC

## 2018-01-17 MED ORDER — ONDANSETRON HCL 4 MG/2ML IJ SOLN
8.0000 mg | Freq: Once | INTRAMUSCULAR | Status: AC
  Administered 2018-01-17: 8 mg via INTRAVENOUS

## 2018-01-17 MED ORDER — SODIUM CHLORIDE 0.9 % IV SOLN
200.0000 mg | Freq: Once | INTRAVENOUS | Status: AC
  Administered 2018-01-17: 200 mg via INTRAVENOUS
  Filled 2018-01-17: qty 8

## 2018-01-17 NOTE — Patient Instructions (Signed)
[facility] Discharge Instructions for Patients Receiving Chemotherapy  Today you received the following chemotherapy agents: Alimta and Keytruda.   To help prevent nausea and vomiting after your treatment, we encourage you to take your nausea medication as directed.   If you develop nausea and vomiting that is not controlled by your nausea medication, call the clinic.   BELOW ARE SYMPTOMS THAT SHOULD BE REPORTED IMMEDIATELY:  *FEVER GREATER THAN 100.5 F  *CHILLS WITH OR WITHOUT FEVER  NAUSEA AND VOMITING THAT IS NOT CONTROLLED WITH YOUR NAUSEA MEDICATION  *UNUSUAL SHORTNESS OF BREATH  *UNUSUAL BRUISING OR BLEEDING  TENDERNESS IN MOUTH AND THROAT WITH OR WITHOUT PRESENCE OF ULCERS  *URINARY PROBLEMS  *BOWEL PROBLEMS  UNUSUAL RASH Items with * indicate a potential emergency and should be followed up as soon as possible.  Feel free to call the clinic should you have any questions or concerns. The clinic phone number is [phone].  Please show the [facility] at check-in to the Emergency Department and triage nurse.

## 2018-01-17 NOTE — Progress Notes (Addendum)
[facility] OFFICE PROGRESS NOTE   DIAGNOSIS: Stage IVB(T1b, N2, M1c)non-small cell lung cancer, adenocarcinoma presented with right upper lobe lung nodule in addition to right hilar and mediastinal lymphadenopathy as well as metastatic liver and bone disease diagnosed in February 2019.  Biomarker Findings Microsatellite status - MS-Stable Tumor Mutational Burden - TMB-Low (4 Muts/Mb) Genomic Findings For a complete list of the genes assayed, please refer to the Appendix. EGFR exon 20 insertion ([id number]) RB1 loss exons 9-17 TP53 P152L 7 Disease relevant genes with no reportable alterations: KRAS, ALK, BRAF, MET, RET, ERBB2, ROS1  PDL 1 expression 5%   PRIOR THERAPY: Palliative radiotherapy to the metastatic bone lesions in the cervical spine and left femur under the care of Dr. Sondra Come.  CURRENT THERAPY: Systemic chemotherapy with carboplatin for AUC of 5, Alimta 500 mg/M2 and Keytruda 200 mg IV every 3 weeks.  First dose October 04, 2017.  Status post 3 cycles.     INTERVAL HISTORY:   Mr. Anthony [name] returns as scheduled.  He completed cycle 4 carboplatin/Alimta/Keytruda 12/06/2017.  Cycle 5 has been on hold due to a laparoscopic cholecystectomy which he underwent on 12/29/2017.  P abdominal pain he was having prior to surgery has resolved.  He now notes mild postsurgical pain.  He denies nausea/vomiting.  He reports intermittent mouth sores.  Bowels moving regularly.  He notes occasional loose stools which he relates to dietary intake.  No rash.  He has stable mild dyspnea on exertion.  No fever.  Cough which she relates to allergies.  No hemoptysis.  He reports depression, decreased appetite and trouble sleeping.  He is interested in an antidepressant.  2 days ago he noted lightheadedness and a "deadness" across the lower back with neck flexion.  This has occurred multiple times.  Objective:  Vital signs in last 24 hours:  Blood pressure 110/80, pulse 85,  temperature 98.2 F (36.8 C), temperature source Oral, resp. rate 18, height '5\' 10"'$  (1.778 m), weight 197 lb 4.8 oz (89.5 kg), SpO2 98 %.    HEENT: No thrush or ulcers. Resp: Lungs clear bilaterally. Cardio: Regular rate and rhythm. GI: Abdomen soft.  No tenderness right abdomen.  No hepatomegaly.  Small surgical incisions. Vascular: No leg edema. Neuro: Alert and oriented.  Gait normal. Skin: No rash.   Lab Results:  Lab Results  Component Value Date   WBC 6.0 01/17/2018   HGB 12.1 (L) 01/17/2018   HCT 34.6 (L) 01/17/2018   MCV 92.9 01/17/2018   PLT 260 01/17/2018   NEUTROABS 4.7 01/17/2018    Imaging:  No results found.  Medications: I have reviewed the patient's current medications.  Assessment/Plan: 1. Stage IV non-small cell lung cancer, adenocarcinoma, with resistant EGFR mutation exon 20 and PDL 1 expression of 5%.  Status post course of palliative radiation to the cervical spine and left hip.  Currently on active treatment with carboplatin/Alimta/Keytruda.  He has completed 4 cycles.  Cycle 5 has been on hold due to undergoing laparoscopic cholecystectomy 12/29/2017. 2. Status post laparoscopic cholecystectomy 12/29/2017.  Disposition: Mr. Anthony [name] has completed 4 cycles of carboplatin/Alimta/Keytruda.  Treatment subsequently placed on hold due to undergoing a laparoscopic cholecystectomy on 12/29/2017.  Plan to resume treatment today with Alimta and Keytruda.  For the depression, anorexia, sleep disturbance Dr. [name] recommends Remeron 30 mg daily.  A prescription was sent to his pharmacy.  He reports "running out" of folic acid.  A new prescription was sent to his pharmacy.  He will return  for lab, follow-up and the next cycle of Alimta/Keytruda in 3 weeks.  He will contact the office in the interim with any problems.  We specifically discussed persistent symptoms with neck flexion or onset of other concerning symptoms.  Patient seen with Dr. [name].      Ned Card ANP/GNP-BC   01/17/2018  9:31 AM  ADDENDUM: Hematology/Oncology Attending: I had a face-to-face encounter with the patient today.  I recommended his care plan.  This is a very pleasant 47 years old white male with a stage IV non-small cell lung cancer, adenocarcinoma with no actionable mutations except for resistant EGFR mutation in exon 20 and PDL 1 expression of 5%.  He is status post 4 cycles of systemic chemotherapy with carboplatin, Alimta and Ketruda (pembrolizumab).  He tolerated this treatment well.  He has been off treatment for the last few weeks secondary to laparoscopic cholecystectomy performed around 3 weeks ago.  He is recovering well from his surgery but continues to complain of fatigue and he also has some signs of depression. I recommended for the patient to resume his treatment with maintenance Alimta and Ketruda (pembrolizumab) and he will start the first cycle of this treatment today. For depression and anorexia, we will start the patient on Remeron 30 mg p.o. nightly. The patient will come back for follow-up visit in 3 weeks for evaluation before starting the second cycle of his maintenance treatment. The patient was advised to call immediately if he has any concerning symptoms in the interval.  Disclaimer: This note was dictated with voice recognition software. Similar sounding words can inadvertently be transcribed and may be missed upon review. Eilleen Kempf, MD 01/17/18

## 2018-01-17 NOTE — Telephone Encounter (Signed)
Appointments scheduled patient declined AVS/Calendar due to My Chart per 6/19 los

## 2018-01-18 ENCOUNTER — Ambulatory Visit: Payer: Self-pay | Admitting: Radiation Oncology

## 2018-01-24 ENCOUNTER — Inpatient Hospital Stay: Payer: BLUE CROSS/BLUE SHIELD

## 2018-01-24 DIAGNOSIS — C3411 Malignant neoplasm of upper lobe, right bronchus or lung: Secondary | ICD-10-CM | POA: Diagnosis not present

## 2018-01-24 DIAGNOSIS — C3491 Malignant neoplasm of unspecified part of right bronchus or lung: Secondary | ICD-10-CM

## 2018-01-24 LAB — CMP (CANCER CENTER ONLY)
ALBUMIN: 3.7 g/dL (ref 3.5–5.0)
ALT: 47 U/L — ABNORMAL HIGH (ref 0–44)
ANION GAP: 11 (ref 5–15)
AST: 20 U/L (ref 15–41)
Alkaline Phosphatase: 59 U/L (ref 38–126)
BUN: 15 mg/dL (ref 6–20)
CALCIUM: 9.8 mg/dL (ref 8.9–10.3)
CHLORIDE: 101 mmol/L (ref 98–111)
CO2: 26 mmol/L (ref 22–32)
Creatinine: 0.91 mg/dL (ref 0.61–1.24)
GFR, Est AFR Am: >60 mL/min (ref >60)
GFR, Estimated: >60 mL/min (ref >60)
GLUCOSE: 119 mg/dL — AB (ref 70–99)
Potassium: 4.1 mmol/L (ref 3.5–5.1)
SODIUM: 138 mmol/L (ref 135–145)
Total Bilirubin: 0.9 mg/dL (ref 0.3–1.2)
Total Protein: 7.2 g/dL (ref 6.5–8.1)

## 2018-01-24 LAB — CBC WITH DIFFERENTIAL (CANCER CENTER ONLY)
Basophils Absolute: 0 10*3/uL (ref 0.0–0.1)
Basophils Relative: 1 %
Eosinophils Absolute: 0.1 10*3/uL (ref 0.0–0.5)
Eosinophils Relative: 3 %
HCT: 35 % — ABNORMAL LOW (ref 38.4–49.9)
Hemoglobin: 12.2 g/dL — ABNORMAL LOW (ref 13.0–17.1)
LYMPHS ABS: 0.8 10*3/uL — AB (ref 0.9–3.3)
LYMPHS PCT: 45 %
MCH: 32.3 pg (ref 27.2–33.4)
MCHC: 34.7 g/dL (ref 32.0–36.0)
MCV: 92.9 fL (ref 79.3–98.0)
MONOS PCT: 13 %
Monocytes Absolute: 0.2 10*3/uL (ref 0.1–0.9)
NEUTROS ABS: 0.7 10*3/uL — AB (ref 1.5–6.5)
NEUTROS PCT: 38 %
PLATELETS: 185 10*3/uL (ref 140–400)
RBC: 3.77 MIL/uL — AB (ref 4.20–5.82)
RDW: 15.7 % — ABNORMAL HIGH (ref 11.0–14.6)
WBC Count: 1.8 10*3/uL — ABNORMAL LOW (ref 4.0–10.3)

## 2018-01-25 ENCOUNTER — Ambulatory Visit
Admission: RE | Admit: 2018-01-25 | Discharge: 2018-01-25 | Disposition: A | Discharge status: Home / Self Care | Payer: BLUE CROSS/BLUE SHIELD | Source: Ambulatory Visit | Attending: Radiation Oncology | Admitting: Radiation Oncology

## 2018-01-25 ENCOUNTER — Encounter: Payer: Self-pay | Admitting: Radiation Oncology

## 2018-01-25 VITALS — BP 137/80 | HR 95 | Temp 98.7°F | Resp 18 | Wt 200.2 lb

## 2018-01-25 DIAGNOSIS — Z7982 Long term (current) use of aspirin: Secondary | ICD-10-CM | POA: Diagnosis not present

## 2018-01-25 DIAGNOSIS — R51 Headache: Secondary | ICD-10-CM

## 2018-01-25 DIAGNOSIS — C3491 Malignant neoplasm of unspecified part of right bronchus or lung: Secondary | ICD-10-CM | POA: Diagnosis not present

## 2018-01-25 DIAGNOSIS — Z08 Encounter for follow-up examination after completed treatment for malignant neoplasm: Secondary | ICD-10-CM | POA: Insufficient documentation

## 2018-01-25 DIAGNOSIS — Z7902 Long term (current) use of antithrombotics/antiplatelets: Secondary | ICD-10-CM | POA: Diagnosis not present

## 2018-01-25 DIAGNOSIS — R519 Headache, unspecified: Secondary | ICD-10-CM

## 2018-01-25 DIAGNOSIS — C7951 Secondary malignant neoplasm of bone: Secondary | ICD-10-CM | POA: Diagnosis not present

## 2018-01-25 DIAGNOSIS — Z79899 Other long term (current) drug therapy: Secondary | ICD-10-CM | POA: Diagnosis not present

## 2018-01-25 NOTE — Progress Notes (Signed)
Pt here today for a 3 month follow up for treatment of C4 and left femur. Pt states that he feels a shooting pain at times that radiates from the back of his neck that shoots down his spine and radiates to both hips. Pt states that this does not happen all the time but when it does he also feels light headed. Pt states moderate fatigue due to chemotherapy treatment that was done last week. Pt states that he has a spacey feeling when he turns his head on either sides. Pt states that he spoke to Dr Earlie Server in his last visit. Pt states that he is on Decadron before, during and after chemotherapy treatments. Pt denies having any swelling his left femur. Pt states that skin looks fine after radiation treatment.   BP 137/80   Pulse 95   Temp 98.7 F (37.1 C)   Resp 18   Wt 200 lb 3.2 oz (90.8 kg)   SpO2 100%   BMI 28.73 kg/m   Wt Readings from Last 3 Encounters:  01/25/18 200 lb 3.2 oz (90.8 kg)  01/17/18 197 lb 4.8 oz (89.5 kg)  12/29/17 196 lb 12.8 oz (89.3 kg)

## 2018-01-25 NOTE — Progress Notes (Signed)
Radiation Oncology         [phone] ________________________________  Name: Anthony [name] MRN: [id number]  Date: 01/25/2018  DOB: Jun 26, 1971  Follow-Up Visit Note  CC: [name], MD  Grace Isaac, MD    ICD-10-CM   1. Bone metastasis ([place]) C79.51   2. Stage 4 lung cancer, right ([facility]) C34.91     Diagnosis: 47 year-old gentleman with Stage IVB (T1b, N2, M1c) non-small cell lung cancer with metastasis to the left hip and C4 vertebrae   Interval Since Last Radiation:  3 months, 21 days  09/21/17-10/04/17: 30 Gy to the C4 spine and 30 Gy to the left femur  Narrative:  The patient returns today for routine follow-up. He is doing well overall with chemotherapy treatment. He notes that he had to have his gallbladder taken out in April due to cholelithiasis.    On review of systems, he reports constant daily lightheadedness, blurred vision, decrease in vision (he uses reading glasses for menus), insomnia, lack of appetite and mosquito bite to right shoulder/chest. He notes that when he bends his head down, it will send a tingling sensation to his bilateral hips and upon raising his head, he is lightheaded and when he turns his head, it feels as if "his brain is following behind his skull."  he denies leg pain, HA, trouble swallowing, and any other symptoms. Pertinent positives are listed and detailed within the above HPI.                         ALLERGIES:  has No Known Allergies.  Meds: Current Outpatient Medications  Medication Sig Dispense Refill  . acetaminophen (TYLENOL) 500 MG tablet Take 500-1,000 mg by mouth daily as needed for moderate pain or headache.    . Alum & Mag Hydroxide-Simeth (GI COCKTAIL) SUSP suspension Take 30 mLs by mouth 3 (three) times daily as needed for indigestion. Shake well. 900 mL 0  . aspirin EC 81 MG EC tablet Take 1 tablet (81 mg total) by mouth daily.    Marland Kitchen atorvastatin (LIPITOR) 40 MG tablet Take 1 tablet (40 mg total) by mouth daily. 90  tablet 3  . cetirizine (ZYRTEC) 10 MG tablet Take 10 mg by mouth daily as needed for allergies.     . chlorproMAZINE (THORAZINE) 25 MG tablet Take 1 tablet (25 mg total) by mouth 3 (three) times daily as needed. 30 tablet 0  . clopidogrel (PLAVIX) 75 MG tablet TAKE 1 TABLET BY MOUTH EVERY DAY 30 tablet 9  . folic acid (FOLVITE) 1 MG tablet Take 1 tablet (1 mg total) by mouth daily. 30 tablet 0  . lisinopril (PRINIVIL,ZESTRIL) 5 MG tablet TAKE 1 TABLET BY MOUTH EVERY DAY 90 tablet 3  . metoprolol tartrate (LOPRESSOR) 25 MG tablet TAKE 1/2 TABLET BY MOUTH 2 TIMES DAILY. (Patient taking differently: TAKE 1/2 TABLET  (12.5 mg) BY MOUTH 2 TIMES DAILY.) 30 tablet 9  . mirtazapine (REMERON) 30 MG tablet Take 1 tablet (30 mg total) by mouth at bedtime. 30 tablet 0  . pantoprazole (PROTONIX) 40 MG tablet Take 1 tablet (40 mg total) by mouth daily. 30 tablet 0  . prochlorperazine (COMPAZINE) 10 MG tablet Take 1 tablet (10 mg total) by mouth every 6 (six) hours as needed for nausea or vomiting. 30 tablet 1  . traMADol (ULTRAM) 50 MG tablet Take 1 tablet (50 mg total) by mouth every 6 (six) hours as needed. 20 tablet 0   No  current facility-administered medications for this encounter.     Physical Findings: The patient is in no acute distress. Patient is alert and oriented.  weight is 200 lb 3.2 oz (90.8 kg). His temperature is 98.7 F (37.1 C). His blood pressure is 137/80 and his pulse is 95. His respiration is 18 and oxygen saturation is 100%. .    Lungs are clear to auscultation bilaterally. Heart has regular rate and rhythm. No carotid bruits noted. No palpable cervical, supraclavicular, or axillary adenopathy. Abdomen soft, non-tender, normal bowel sounds. Neurologic exam reveals no deficits noted.    Lab Findings: Lab Results  Component Value Date   WBC 1.8 (L) 01/24/2018   HGB 12.2 (L) 01/24/2018   HCT 35.0 (L) 01/24/2018   MCV 92.9 01/24/2018   PLT 185 01/24/2018    Radiographic  Findings: No results found.  Impression:  Stage IVB (T1b, N2, M1c) non-small cell lung cancer with metastasis to the left hip and C4 vertebrae  The patient seems to be tolerating his chemotherapy well with most recent scans showing response to his treatment. The symptoms the patient is experiencing are hard to explain, but with his hx we will order a MRI of the brain and MRI of the C spine for further evaluation.    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    Plan: Follow-up with radiation oncology PRN. Follow up with Dr. [name] as scheduled. Will order a MRI Brain and MRI C-spine.  ____________________________________  This document serves as a record of services personally performed by Gery Pray, MD. It was created on his behalf by Steva Colder, a trained medical scribe. The creation of this record is based on the scribe's personal observations and the provider's statements to them. This document has been checked and approved by the attending provider.

## 2018-01-31 ENCOUNTER — Inpatient Hospital Stay: Payer: BLUE CROSS/BLUE SHIELD | Attending: Internal Medicine

## 2018-01-31 DIAGNOSIS — Z79899 Other long term (current) drug therapy: Secondary | ICD-10-CM | POA: Diagnosis not present

## 2018-01-31 DIAGNOSIS — Z923 Personal history of irradiation: Secondary | ICD-10-CM | POA: Insufficient documentation

## 2018-01-31 DIAGNOSIS — K219 Gastro-esophageal reflux disease without esophagitis: Secondary | ICD-10-CM | POA: Insufficient documentation

## 2018-01-31 DIAGNOSIS — C778 Secondary and unspecified malignant neoplasm of lymph nodes of multiple regions: Secondary | ICD-10-CM | POA: Insufficient documentation

## 2018-01-31 DIAGNOSIS — Z5112 Encounter for antineoplastic immunotherapy: Secondary | ICD-10-CM | POA: Insufficient documentation

## 2018-01-31 DIAGNOSIS — I252 Old myocardial infarction: Secondary | ICD-10-CM | POA: Insufficient documentation

## 2018-01-31 DIAGNOSIS — C787 Secondary malignant neoplasm of liver and intrahepatic bile duct: Secondary | ICD-10-CM | POA: Insufficient documentation

## 2018-01-31 DIAGNOSIS — C7951 Secondary malignant neoplasm of bone: Secondary | ICD-10-CM | POA: Diagnosis not present

## 2018-01-31 DIAGNOSIS — C3491 Malignant neoplasm of unspecified part of right bronchus or lung: Secondary | ICD-10-CM

## 2018-01-31 DIAGNOSIS — C3411 Malignant neoplasm of upper lobe, right bronchus or lung: Secondary | ICD-10-CM | POA: Diagnosis present

## 2018-01-31 LAB — CMP (CANCER CENTER ONLY)
ALBUMIN: 3.6 g/dL (ref 3.5–5.0)
ALT: 379 U/L (ref 0–44)
ANION GAP: 7 (ref 5–15)
AST: 169 U/L — ABNORMAL HIGH (ref 15–41)
Alkaline Phosphatase: 80 U/L (ref 38–126)
BILIRUBIN TOTAL: 0.4 mg/dL (ref 0.3–1.2)
BUN: 8 mg/dL (ref 6–20)
CHLORIDE: 105 mmol/L (ref 98–111)
CO2: 30 mmol/L (ref 22–32)
Calcium: 9.8 mg/dL (ref 8.9–10.3)
Creatinine: 0.93 mg/dL (ref 0.61–1.24)
GFR, Est AFR Am: >60 mL/min (ref >60)
GFR, Estimated: >60 mL/min (ref >60)
GLUCOSE: 97 mg/dL (ref 70–99)
POTASSIUM: 4.6 mmol/L (ref 3.5–5.1)
SODIUM: 142 mmol/L (ref 135–145)
TOTAL PROTEIN: 6.5 g/dL (ref 6.5–8.1)

## 2018-01-31 LAB — CBC WITH DIFFERENTIAL (CANCER CENTER ONLY)
BASOS ABS: 0 10*3/uL (ref 0.0–0.1)
Basophils Relative: 1 %
Eosinophils Absolute: 0.1 10*3/uL (ref 0.0–0.5)
Eosinophils Relative: 2 %
HEMATOCRIT: 35.3 % — AB (ref 38.4–49.9)
Hemoglobin: 12 g/dL — ABNORMAL LOW (ref 13.0–17.1)
LYMPHS PCT: 37 %
Lymphs Abs: 1.1 10*3/uL (ref 0.9–3.3)
MCH: 31.5 pg (ref 27.2–33.4)
MCHC: 33.8 g/dL (ref 32.0–36.0)
MCV: 93.2 fL (ref 79.3–98.0)
MONO ABS: 0.4 10*3/uL (ref 0.1–0.9)
Monocytes Relative: 15 %
NEUTROS ABS: 1.3 10*3/uL — AB (ref 1.5–6.5)
Neutrophils Relative %: 45 %
Platelet Count: 197 10*3/uL (ref 140–400)
RBC: 3.79 MIL/uL — ABNORMAL LOW (ref 4.20–5.82)
RDW: 15.8 % — AB (ref 11.0–14.6)
WBC Count: 2.9 10*3/uL — ABNORMAL LOW (ref 4.0–10.3)

## 2018-02-02 ENCOUNTER — Other Ambulatory Visit: Payer: BLUE CROSS/BLUE SHIELD | Admitting: *Deleted

## 2018-02-02 DIAGNOSIS — I2510 Atherosclerotic heart disease of native coronary artery without angina pectoris: Secondary | ICD-10-CM

## 2018-02-02 LAB — HEPATIC FUNCTION PANEL
ALK PHOS: 84 IU/L (ref 39–117)
ALT: 338 IU/L — AB (ref 0–44)
AST: 125 IU/L — ABNORMAL HIGH (ref 0–40)
Albumin: 4.1 g/dL (ref 3.5–5.5)
Bilirubin Total: 0.4 mg/dL (ref 0.0–1.2)
Bilirubin, Direct: 0.14 mg/dL (ref 0.00–0.40)
Total Protein: 6.2 g/dL (ref 6.0–8.5)

## 2018-02-02 LAB — LIPID PANEL
CHOLESTEROL TOTAL: 127 mg/dL (ref 100–199)
Chol/HDL Ratio: 3.6 ratio (ref 0.0–5.0)
HDL: 35 mg/dL — ABNORMAL LOW (ref >39)
LDL CALC: 72 mg/dL (ref 0–99)
TRIGLYCERIDES: 99 mg/dL (ref 0–149)
VLDL CHOLESTEROL CAL: 20 mg/dL (ref 5–40)

## 2018-02-06 NOTE — Progress Notes (Signed)
[name] OFFICE PROGRESS NOTE  Anthony Cha, MD [address] [place] [address]  DIAGNOSIS:Stage IVB(T1b, N2, M1c)non-small cell lung cancer, adenocarcinoma presented with right upper lobe lung nodule in addition to right hilar and mediastinal lymphadenopathy as well as metastatic liver and bone disease diagnosed in February 2019.  Biomarker Findings Microsatellite status - MS-Stable Tumor Mutational Burden - TMB-Low (4 Muts/Mb) Genomic Findings For a complete list of the genes assayed, please refer to the Appendix. EGFR exon 20 insertion ([id number]) RB1 loss exons 9-17 TP53 P152L 7 Disease relevant genes with no reportable alterations: KRAS, ALK, BRAF, MET, RET, ERBB2, ROS1  PDL 1 expression5%  PRIOR THERAPY: Palliative radiotherapy to the metastatic bone lesions in the cervical spine and left femur under the care of Dr. Sondra Come.  CURRENT THERAPY: Systemic chemotherapy with carboplatin for AUC of 5, Alimta 500 mg/M2 and Keytruda 200 mg IV every 3 weeks. First dose October 04, 2017.  Started with cycle #5, the patient continued on maintenance Alimta and Keytruda.  He is status post 5 cycles of treatment.  INTERVAL HISTORY: Anthony [name] 47 y.o. male returns for routine follow-up visit by himself.  The patient is feeling fine today and has no specific complaints.  Following his last cycle of treatment he did experience flulike symptoms for approximately 3 days.  He had a low-grade fever and myalgias.  This is since resolved.  He denies fevers and chills today.  Denies chest pain, shortness of breath, cough, hemoptysis.  Denies nausea, vomiting, constipation, diarrhea.  He reports that his mood has improved since starting Remeron.  He is gained back some of his lost weight but does not really notice that his appetite has improved much.  He would like to continue on this medication.  The patient is here for evaluation prior to starting cycle #6  of his treatment.  MEDICAL HISTORY: Past Medical History:  Diagnosis Date  . Acid reflux   . History of radiation therapy 09/21/17-10/04/17   C4 spine 30 Gy in 10 fractions, left femur 30 Gy in 10 fractions  . Non-small cell lung cancer ([facility])   . NSTEMI (non-ST elevated myocardial infarction) ([facility]) 06/13/2017   [name] 06/14/2017  . Tailbone injury since 1993   "cracked"    ALLERGIES:  has No Known Allergies.  MEDICATIONS:  Current Outpatient Medications  Medication Sig Dispense Refill  . acetaminophen (TYLENOL) 500 MG tablet Take 500-1,000 mg by mouth daily as needed for moderate pain or headache.    Marland Kitchen aspirin EC 81 MG EC tablet Take 1 tablet (81 mg total) by mouth daily.    Marland Kitchen atorvastatin (LIPITOR) 40 MG tablet Take 1 tablet (40 mg total) by mouth daily. 90 tablet 3  . cetirizine (ZYRTEC) 10 MG tablet Take 10 mg by mouth daily as needed for allergies.     Marland Kitchen clopidogrel (PLAVIX) 75 MG tablet TAKE 1 TABLET BY MOUTH EVERY DAY 30 tablet 9  . folic acid (FOLVITE) 1 MG tablet Take 1 tablet (1 mg total) by mouth daily. 30 tablet 0  . lisinopril (PRINIVIL,ZESTRIL) 5 MG tablet TAKE 1 TABLET BY MOUTH EVERY DAY 90 tablet 3  . metoprolol tartrate (LOPRESSOR) 25 MG tablet TAKE 1/2 TABLET BY MOUTH 2 TIMES DAILY. (Patient taking differently: TAKE 1/2 TABLET  (12.5 mg) BY MOUTH 2 TIMES DAILY.) 30 tablet 9  . mirtazapine (REMERON) 30 MG tablet Take 1 tablet (30 mg total) by mouth at bedtime. 30 tablet 2  . pantoprazole (PROTONIX)  40 MG tablet Take 1 tablet (40 mg total) by mouth daily. 30 tablet 0  . Alum & Mag Hydroxide-Simeth (GI COCKTAIL) SUSP suspension Take 30 mLs by mouth 3 (three) times daily as needed for indigestion. Shake well. (Patient not taking: Reported on 02/07/2018) 900 mL 0  . chlorproMAZINE (THORAZINE) 25 MG tablet Take 1 tablet (25 mg total) by mouth 3 (three) times daily as needed. (Patient not taking: Reported on 02/07/2018) 30 tablet 0  . prochlorperazine (COMPAZINE) 10 MG tablet  Take 1 tablet (10 mg total) by mouth every 6 (six) hours as needed for nausea or vomiting. (Patient not taking: Reported on 02/07/2018) 30 tablet 1  . traMADol (ULTRAM) 50 MG tablet Take 1 tablet (50 mg total) by mouth every 6 (six) hours as needed. (Patient not taking: Reported on 02/07/2018) 20 tablet 0   No current facility-administered medications for this visit.     SURGICAL HISTORY:  Past Surgical History:  Procedure Laterality Date  . CARDIAC CATHETERIZATION  06/14/2017  . CHOLECYSTECTOMY N/A 12/29/2017   Procedure: LAPAROSCOPIC CHOLECYSTECTOMY;  Surgeon: Ralene Ok, MD;  Location: [facility];  Service: General;  Laterality: N/A;  . CHOLECYSTECTOMY, LAPAROSCOPIC N/A 12/29/2017  . CORONARY ARTERY BYPASS GRAFT N/A 06/19/2017   Procedure: CORONARY ARTERY BYPASS GRAFTING (CABG), OFF PUMP, times one using the left internal mammary artery to LAD;  Surgeon: Grace Isaac, MD;  Location: [facility];  Service: Open Heart Surgery;  Laterality: N/A;  . ESOPHAGOGASTRODUODENOSCOPY (EGD) WITH PROPOFOL N/A 11/30/2017   Procedure: ESOPHAGOGASTRODUODENOSCOPY (EGD) WITH PROPOFOL;  Surgeon: Gatha Mayer, MD;  Location: [facility];  Service: Endoscopy;  Laterality: N/A;  . LEFT HEART CATH AND CORONARY ANGIOGRAPHY N/A 06/14/2017   Procedure: LEFT HEART CATH AND CORONARY ANGIOGRAPHY;  Surgeon: Sherren Mocha, MD;  Location: [facility];  Service: Cardiovascular;  Laterality: N/A;  . TEE WITHOUT CARDIOVERSION N/A 06/19/2017   Procedure: TRANSESOPHAGEAL ECHOCARDIOGRAM (TEE);  Surgeon: Grace Isaac, MD;  Location: [facility];  Service: Open Heart Surgery;  Laterality: N/A;  . TONSILLECTOMY  ~ 1977    REVIEW OF SYSTEMS:   Review of Systems  Constitutional: Negative for appetite change, chills, fatigue, fever and unexpected weight change.  HENT:   Negative for mouth sores, nosebleeds, sore throat and trouble swallowing.   Eyes: Negative for eye problems and icterus.  Respiratory: Negative for  cough, hemoptysis, shortness of breath and wheezing.   Cardiovascular: Negative for chest pain and leg swelling.  Gastrointestinal: Negative for abdominal pain, constipation, diarrhea, nausea and vomiting.  Genitourinary: Negative for bladder incontinence, difficulty urinating, dysuria, frequency and hematuria.   Musculoskeletal: Negative for back pain, gait problem, neck pain and neck stiffness.  Skin: Negative for itching and rash.  Neurological: Negative for dizziness, extremity weakness, gait problem, headaches, light-headedness and seizures.  Hematological: Negative for adenopathy. Does not bruise/bleed easily.  Psychiatric/Behavioral: Negative for confusion, depression and sleep disturbance. The patient is not nervous/anxious.     PHYSICAL EXAMINATION:  Blood pressure 120/90, pulse 99, temperature 98.4 F (36.9 C), temperature source Oral, resp. rate 17, height _0  (1.778 m), weight 201 lb 9.6 oz (91.4 kg), SpO2 99 %.  ECOG PERFORMANCE STATUS: 1 - Symptomatic but completely ambulatory  Physical Exam  Constitutional: Oriented to person, place, and time and well-developed, well-nourished, and in no distress. No distress.  HENT:  Head: Normocephalic and atraumatic.  Mouth/Throat: Oropharynx is clear and moist. No oropharyngeal exudate.  Eyes: Conjunctivae are normal. Right eye exhibits no discharge. Left eye  exhibits no discharge. No scleral icterus.  Neck: Normal range of motion. Neck supple.  Cardiovascular: Normal rate, regular rhythm, normal heart sounds and intact distal pulses.   Pulmonary/Chest: Effort normal and breath sounds normal. No respiratory distress. No wheezes. No rales.  Abdominal: Soft. Bowel sounds are normal. Exhibits no distension and no mass. There is no tenderness.  Musculoskeletal: Normal range of motion. Exhibits no edema.  Lymphadenopathy:    No cervical adenopathy.  Neurological: Alert and oriented to person, place, and time. Exhibits normal muscle  tone. Gait normal. Coordination normal.  Skin: Skin is warm and dry. No rash noted. Not diaphoretic. No erythema. No pallor.  Psychiatric: Mood, memory and judgment normal.  Vitals reviewed.  LABORATORY DATA: Lab Results  Component Value Date   WBC 4.7 02/07/2018   HGB 13.6 02/07/2018   HCT 39.1 02/07/2018   MCV 92.4 02/07/2018   PLT 401 (H) 02/07/2018      Chemistry      Component Value Date/Time   NA 141 02/07/2018 0749   NA 141 07/17/2017 1006   K 4.3 02/07/2018 0749   CL 107 02/07/2018 0749   CO2 23 02/07/2018 0749   BUN 15 02/07/2018 0749   BUN 12 07/17/2017 1006   CREATININE 1.13 02/07/2018 0749      Component Value Date/Time   CALCIUM 10.3 02/07/2018 0749   ALKPHOS 94 02/07/2018 0749   AST 44 (H) 02/07/2018 0749   ALT 163 (H) 02/07/2018 0749   BILITOT 0.5 02/07/2018 0749       RADIOGRAPHIC STUDIES:  No results found.   ASSESSMENT/PLAN:  Stage 4 lung cancer, right ([facility]) This is a very pleasant 47 year old white male with stage IV non-small cell lung cancer, adenocarcinoma with resistant EGFR mutation in exon 20 and PDL 1 expression of 5%. The patient completed a course of palliative radiotherapy to the cervical spine as well as left hip under the care of Dr. Sondra Come.Marland Kitchen He is currently undergoing systemic chemotherapy with carboplatin, Alimta and Keytruda status post 4 cycles and then beginning with cycle 5 he was started on maintenance Alimta with Keytruda.  He is status post 5 cycles of his treatment.  He continues to tolerate this treatment fairly well with no concerning complaints. He has had elevated liver enzymes while on treatment.  Liver enzymes today have improved but are not normalized.  Labs reviewed with Dr. [name] and recommend for him to proceed with cycle 6 of his treatment today as scheduled. The patient was advised to not take any Tylenol, not drink any alcohol, and to stop his atorvastatin for now.  We will monitor his labs weekly for now. The  patient will have a restaging CT scan of the chest, abdomen, pelvis prior to his next visit.  He will follow-up in 3 weeks for evaluation prior to cycle #7 of his treatment and to review his restaging CT scan results.  The patient was advised to call if he has any concerning symptoms in the interval. The patient voices understanding of current disease status and treatment options and is in agreement with the current care plan.  All questions were answered. The patient knows to call the clinic with any problems, questions or concerns. We can certainly see the patient much sooner if necessary.   Orders Placed This Encounter  Procedures  . CT ABDOMEN PELVIS W CONTRAST    Standing Status:   Future    Standing Expiration Date:   02/08/2019    Order Specific  Question:   If indicated for the ordered procedure, I authorize the administration of contrast media per Radiology protocol    Answer:   Yes    Order Specific Question:   Preferred imaging location?    Answer:   [facility]    Order Specific Question:   Radiology Contrast Protocol - do NOT remove file path    Answer:   [email or web address]    Order Specific Question:   ** REASON FOR EXAM (FREE TEXT)    Answer:   lung cancer. restaging.  . CT CHEST W CONTRAST    Standing Status:   Future    Standing Expiration Date:   02/08/2019    Order Specific Question:   If indicated for the ordered procedure, I authorize the administration of contrast media per Radiology protocol    Answer:   Yes    Order Specific Question:   Preferred imaging location?    Answer:   [facility]    Order Specific Question:   Radiology Contrast Protocol - do NOT remove file path    Answer:   [email or web address]    Order Specific Question:   ** REASON FOR EXAM (FREE TEXT)    Answer:   lung cancer. restaging.   Mikey Bussing, DNP, AGPCNP-BC, AOCNP 02/07/18

## 2018-02-07 ENCOUNTER — Telehealth: Payer: Self-pay | Admitting: Internal Medicine

## 2018-02-07 ENCOUNTER — Inpatient Hospital Stay (HOSPITAL_BASED_OUTPATIENT_CLINIC_OR_DEPARTMENT_OTHER): Payer: BLUE CROSS/BLUE SHIELD | Admitting: Oncology

## 2018-02-07 ENCOUNTER — Inpatient Hospital Stay: Payer: BLUE CROSS/BLUE SHIELD

## 2018-02-07 ENCOUNTER — Encounter: Payer: Self-pay | Admitting: Oncology

## 2018-02-07 VITALS — BP 120/90 | HR 99 | Temp 98.4°F | Resp 17 | Ht 70.0 in | Wt 201.6 lb

## 2018-02-07 DIAGNOSIS — C778 Secondary and unspecified malignant neoplasm of lymph nodes of multiple regions: Secondary | ICD-10-CM

## 2018-02-07 DIAGNOSIS — C3491 Malignant neoplasm of unspecified part of right bronchus or lung: Secondary | ICD-10-CM

## 2018-02-07 DIAGNOSIS — Z5112 Encounter for antineoplastic immunotherapy: Secondary | ICD-10-CM

## 2018-02-07 DIAGNOSIS — Z923 Personal history of irradiation: Secondary | ICD-10-CM

## 2018-02-07 DIAGNOSIS — C7951 Secondary malignant neoplasm of bone: Secondary | ICD-10-CM

## 2018-02-07 DIAGNOSIS — R7989 Other specified abnormal findings of blood chemistry: Secondary | ICD-10-CM

## 2018-02-07 DIAGNOSIS — K219 Gastro-esophageal reflux disease without esophagitis: Secondary | ICD-10-CM

## 2018-02-07 DIAGNOSIS — C3411 Malignant neoplasm of upper lobe, right bronchus or lung: Secondary | ICD-10-CM

## 2018-02-07 DIAGNOSIS — C787 Secondary malignant neoplasm of liver and intrahepatic bile duct: Secondary | ICD-10-CM | POA: Diagnosis not present

## 2018-02-07 DIAGNOSIS — R945 Abnormal results of liver function studies: Secondary | ICD-10-CM

## 2018-02-07 DIAGNOSIS — Z5111 Encounter for antineoplastic chemotherapy: Secondary | ICD-10-CM

## 2018-02-07 DIAGNOSIS — I252 Old myocardial infarction: Secondary | ICD-10-CM

## 2018-02-07 DIAGNOSIS — Z79899 Other long term (current) drug therapy: Secondary | ICD-10-CM

## 2018-02-07 LAB — CBC WITH DIFFERENTIAL (CANCER CENTER ONLY)
BASOS PCT: 0 %
Basophils Absolute: 0 10*3/uL (ref 0.0–0.1)
EOS ABS: 0 10*3/uL (ref 0.0–0.5)
EOS PCT: 0 %
HCT: 39.1 % (ref 38.4–49.9)
HEMOGLOBIN: 13.6 g/dL (ref 13.0–17.1)
LYMPHS ABS: 0.9 10*3/uL (ref 0.9–3.3)
Lymphocytes Relative: 20 %
MCH: 32.2 pg (ref 27.2–33.4)
MCHC: 34.8 g/dL (ref 32.0–36.0)
MCV: 92.4 fL (ref 79.3–98.0)
Monocytes Absolute: 0.1 10*3/uL (ref 0.1–0.9)
Monocytes Relative: 3 %
NEUTROS PCT: 77 %
Neutro Abs: 3.6 10*3/uL (ref 1.5–6.5)
PLATELETS: 401 10*3/uL — AB (ref 140–400)
RBC: 4.23 MIL/uL (ref 4.20–5.82)
RDW: 14.2 % (ref 11.0–14.6)
WBC: 4.7 10*3/uL (ref 4.0–10.3)

## 2018-02-07 LAB — CMP (CANCER CENTER ONLY)
ALK PHOS: 94 U/L (ref 38–126)
ALT: 163 U/L — AB (ref 0–44)
ANION GAP: 11 (ref 5–15)
AST: 44 U/L — ABNORMAL HIGH (ref 15–41)
Albumin: 4.2 g/dL (ref 3.5–5.0)
BUN: 15 mg/dL (ref 6–20)
CHLORIDE: 107 mmol/L (ref 98–111)
CO2: 23 mmol/L (ref 22–32)
CREATININE: 1.13 mg/dL (ref 0.61–1.24)
Calcium: 10.3 mg/dL (ref 8.9–10.3)
GFR, Estimated: >60 mL/min (ref >60)
GLUCOSE: 159 mg/dL — AB (ref 70–99)
Potassium: 4.3 mmol/L (ref 3.5–5.1)
SODIUM: 141 mmol/L (ref 135–145)
Total Bilirubin: 0.5 mg/dL (ref 0.3–1.2)
Total Protein: 7.7 g/dL (ref 6.5–8.1)

## 2018-02-07 LAB — TSH: TSH: 0.469 u[IU]/mL (ref 0.320–4.118)

## 2018-02-07 MED ORDER — DEXAMETHASONE SODIUM PHOSPHATE 10 MG/ML IJ SOLN
INTRAMUSCULAR | Status: AC
  Filled 2018-02-07: qty 1

## 2018-02-07 MED ORDER — ONDANSETRON HCL 4 MG/2ML IJ SOLN
INTRAMUSCULAR | Status: AC
  Filled 2018-02-07: qty 4

## 2018-02-07 MED ORDER — SODIUM CHLORIDE 0.9 % IV SOLN
510.0000 mg/m2 | Freq: Once | INTRAVENOUS | Status: AC
  Administered 2018-02-07: 1100 mg via INTRAVENOUS
  Filled 2018-02-07: qty 40

## 2018-02-07 MED ORDER — ONDANSETRON HCL 4 MG/2ML IJ SOLN
8.0000 mg | Freq: Once | INTRAMUSCULAR | Status: AC
Start: 2018-02-07 — End: 2018-02-07
  Administered 2018-02-07: 8 mg via INTRAVENOUS

## 2018-02-07 MED ORDER — CYANOCOBALAMIN 1000 MCG/ML IJ SOLN
1000.0000 ug | Freq: Once | INTRAMUSCULAR | Status: AC
  Administered 2018-02-07: 1000 ug via INTRAMUSCULAR

## 2018-02-07 MED ORDER — SODIUM CHLORIDE 0.9 % IV SOLN
Freq: Once | INTRAVENOUS | Status: AC
Start: 2018-02-07 — End: 2018-02-07
  Administered 2018-02-07: 09:00:00 via INTRAVENOUS

## 2018-02-07 MED ORDER — SODIUM CHLORIDE 0.9 % IV SOLN: Freq: Once | INTRAVENOUS | Status: AC

## 2018-02-07 MED ORDER — SODIUM CHLORIDE 0.9 % IV SOLN
200.0000 mg | Freq: Once | INTRAVENOUS | Status: AC
  Administered 2018-02-07: 200 mg via INTRAVENOUS
  Filled 2018-02-07: qty 8

## 2018-02-07 MED ORDER — MIRTAZAPINE 30 MG PO TABS: 30.0000 mg | ORAL_TABLET | Freq: Every day | ORAL | 2 refills | Status: DC

## 2018-02-07 MED ORDER — DEXAMETHASONE SODIUM PHOSPHATE 10 MG/ML IJ SOLN
10.0000 mg | Freq: Once | INTRAMUSCULAR | Status: AC
  Administered 2018-02-07: 10 mg via INTRAVENOUS

## 2018-02-07 MED ORDER — CYANOCOBALAMIN 1000 MCG/ML IJ SOLN
INTRAMUSCULAR | Status: AC
  Filled 2018-02-07: qty 1

## 2018-02-07 NOTE — Telephone Encounter (Signed)
Appointments scheduled patient declined AVS/Calendar(mychart access) contrast material provided with instructions per 7/10 los

## 2018-02-07 NOTE — Patient Instructions (Signed)
[facility] Discharge Instructions for Patients Receiving Chemotherapy  Today you received the following chemotherapy agents: Pembrolizumab (Keytruda) and Pemetrexed (Alimta)  To help prevent nausea and vomiting after your treatment, we encourage you to take your nausea medication as prescribed.    If you develop nausea and vomiting that is not controlled by your nausea medication, call the clinic.   BELOW ARE SYMPTOMS THAT SHOULD BE REPORTED IMMEDIATELY:  *FEVER GREATER THAN 100.5 F  *CHILLS WITH OR WITHOUT FEVER  NAUSEA AND VOMITING THAT IS NOT CONTROLLED WITH YOUR NAUSEA MEDICATION  *UNUSUAL SHORTNESS OF BREATH  *UNUSUAL BRUISING OR BLEEDING  TENDERNESS IN MOUTH AND THROAT WITH OR WITHOUT PRESENCE OF ULCERS  *URINARY PROBLEMS  *BOWEL PROBLEMS  UNUSUAL RASH Items with * indicate a potential emergency and should be followed up as soon as possible.  Feel free to call the clinic should you have any questions or concerns. The clinic phone number is [phone].  Please show the [facility] at check-in to the Emergency Department and triage nurse.

## 2018-02-07 NOTE — Assessment & Plan Note (Signed)
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 The patient completed a course of palliative radiotherapy to the cervical spine as well as left hip under the care of Dr. Sondra Come.Marland Kitchen He is currently undergoing systemic chemotherapy with carboplatin, Alimta and Keytruda status post 4 cycles and then beginning with cycle 5 he was started on maintenance Alimta with Keytruda.  He is status post 5 cycles of his treatment.  He continues to tolerate this treatment fairly well with no concerning complaints. He has had elevated liver enzymes while on treatment.  Liver enzymes today have improved but are not normalized.  Labs reviewed with Dr. [name] and recommend for him to proceed with cycle 6 of his treatment today as scheduled. The patient was advised to not take any Tylenol, not drink any alcohol, and to stop his atorvastatin for now.  We will monitor his labs weekly for now. The patient will have a restaging CT scan of the chest, abdomen, pelvis prior to his next visit.  He will follow-up in 3 weeks for evaluation prior to cycle #7 of his treatment and to review his restaging CT scan results.  The patient was advised to call if he has any concerning symptoms in the interval. The patient voices understanding of current disease status and treatment options and is in agreement with the current care plan.  All questions were answered. The patient knows to call the clinic with any problems, questions or concerns. We can certainly see the patient much sooner if necessary.

## 2018-02-07 NOTE — Progress Notes (Signed)
Ok to treat pt today with [place] and Alimita and today's CMP results.

## 2018-02-08 ENCOUNTER — Telehealth: Payer: Self-pay | Admitting: Interventional Cardiology

## 2018-02-08 NOTE — Telephone Encounter (Signed)
Spoke with pt and went over lab results.  See liver/lipid note.

## 2018-02-08 NOTE — Telephone Encounter (Signed)
New Message   Patient states that he is returning the call from the nurse. No notes as to what the call is about. Please call.

## 2018-02-14 ENCOUNTER — Inpatient Hospital Stay: Payer: BLUE CROSS/BLUE SHIELD

## 2018-02-14 ENCOUNTER — Telehealth: Payer: Self-pay

## 2018-02-14 DIAGNOSIS — C3411 Malignant neoplasm of upper lobe, right bronchus or lung: Secondary | ICD-10-CM | POA: Diagnosis not present

## 2018-02-14 DIAGNOSIS — C3491 Malignant neoplasm of unspecified part of right bronchus or lung: Secondary | ICD-10-CM

## 2018-02-14 LAB — CBC WITH DIFFERENTIAL (CANCER CENTER ONLY)
Basophils Absolute: 0 10*3/uL (ref 0.0–0.1)
Basophils Relative: 0 %
EOS ABS: 0 10*3/uL (ref 0.0–0.5)
EOS PCT: 1 %
HCT: 34.4 % — ABNORMAL LOW (ref 38.4–49.9)
Hemoglobin: 12.1 g/dL — ABNORMAL LOW (ref 13.0–17.1)
LYMPHS ABS: 1 10*3/uL (ref 0.9–3.3)
Lymphocytes Relative: 47 %
MCH: 32.5 pg (ref 27.2–33.4)
MCHC: 35.1 g/dL (ref 32.0–36.0)
MCV: 92.4 fL (ref 79.3–98.0)
MONO ABS: 0.5 10*3/uL (ref 0.1–0.9)
MONOS PCT: 22 %
Neutro Abs: 0.7 10*3/uL — ABNORMAL LOW (ref 1.5–6.5)
Neutrophils Relative %: 30 %
PLATELETS: 212 10*3/uL (ref 140–400)
RBC: 3.73 MIL/uL — ABNORMAL LOW (ref 4.20–5.82)
RDW: 14.5 % (ref 11.0–14.6)
WBC Count: 2.2 10*3/uL — ABNORMAL LOW (ref 4.0–10.3)

## 2018-02-14 LAB — CMP (CANCER CENTER ONLY)
ALT: 41 U/L (ref 0–44)
ANION GAP: 11 (ref 5–15)
AST: 20 U/L (ref 15–41)
Albumin: 3.4 g/dL — ABNORMAL LOW (ref 3.5–5.0)
Alkaline Phosphatase: 70 U/L (ref 38–126)
BUN: 13 mg/dL (ref 6–20)
CHLORIDE: 102 mmol/L (ref 98–111)
CO2: 26 mmol/L (ref 22–32)
Calcium: 9.8 mg/dL (ref 8.9–10.3)
Creatinine: 0.91 mg/dL (ref 0.61–1.24)
GFR, Estimated: >60 mL/min (ref >60)
Glucose, Bld: 104 mg/dL — ABNORMAL HIGH (ref 70–99)
Potassium: 3.9 mmol/L (ref 3.5–5.1)
SODIUM: 139 mmol/L (ref 135–145)
Total Bilirubin: 0.5 mg/dL (ref 0.3–1.2)
Total Protein: 7 g/dL (ref 6.5–8.1)

## 2018-02-14 NOTE — Telephone Encounter (Signed)
Left message with patient stating that his [place] is low, reviewed neutropenic precautions, liver function normal, to continue to avoid Tylenol and alcohol and to restart atorvastatin. Can return call if he has any questions.

## 2018-02-19 ENCOUNTER — Other Ambulatory Visit: Payer: Self-pay | Admitting: Medical Oncology

## 2018-02-19 DIAGNOSIS — C3491 Malignant neoplasm of unspecified part of right bronchus or lung: Secondary | ICD-10-CM

## 2018-02-19 MED ORDER — FOLIC ACID 1 MG PO TABS: 1.0000 mg | ORAL_TABLET | Freq: Every day | ORAL | 0 refills | Status: DC

## 2018-02-21 ENCOUNTER — Ambulatory Visit (HOSPITAL_COMMUNITY)
Admission: RE | Admit: 2018-02-21 | Discharge: 2018-02-21 | Disposition: A | Discharge status: Home / Self Care | Payer: BLUE CROSS/BLUE SHIELD | Source: Ambulatory Visit | Attending: Radiation Oncology | Admitting: Radiation Oncology

## 2018-02-21 ENCOUNTER — Ambulatory Visit (HOSPITAL_COMMUNITY): Admission: RE | Admit: 2018-02-21 | Payer: BLUE CROSS/BLUE SHIELD | Source: Ambulatory Visit

## 2018-02-21 ENCOUNTER — Ambulatory Visit (HOSPITAL_COMMUNITY)
Admission: RE | Admit: 2018-02-21 | Discharge: 2018-02-21 | Disposition: A | Discharge status: Home / Self Care | Payer: BLUE CROSS/BLUE SHIELD | Source: Ambulatory Visit | Attending: Oncology | Admitting: Oncology

## 2018-02-21 ENCOUNTER — Inpatient Hospital Stay: Payer: BLUE CROSS/BLUE SHIELD

## 2018-02-21 DIAGNOSIS — C3491 Malignant neoplasm of unspecified part of right bronchus or lung: Secondary | ICD-10-CM

## 2018-02-21 DIAGNOSIS — R160 Hepatomegaly, not elsewhere classified: Secondary | ICD-10-CM | POA: Diagnosis not present

## 2018-02-21 DIAGNOSIS — M50223 Other cervical disc displacement at C6-C7 level: Secondary | ICD-10-CM | POA: Insufficient documentation

## 2018-02-21 DIAGNOSIS — R51 Headache: Secondary | ICD-10-CM | POA: Insufficient documentation

## 2018-02-21 DIAGNOSIS — R519 Headache, unspecified: Secondary | ICD-10-CM

## 2018-02-21 DIAGNOSIS — C3411 Malignant neoplasm of upper lobe, right bronchus or lung: Secondary | ICD-10-CM | POA: Diagnosis not present

## 2018-02-21 DIAGNOSIS — C7951 Secondary malignant neoplasm of bone: Secondary | ICD-10-CM | POA: Diagnosis not present

## 2018-02-21 LAB — CBC WITH DIFFERENTIAL (CANCER CENTER ONLY)
BASOS ABS: 0 10*3/uL (ref 0.0–0.1)
BASOS PCT: 1 %
EOS PCT: 1 %
Eosinophils Absolute: 0 10*3/uL (ref 0.0–0.5)
HCT: 36.4 % — ABNORMAL LOW (ref 38.4–49.9)
Hemoglobin: 12.3 g/dL — ABNORMAL LOW (ref 13.0–17.1)
LYMPHS PCT: 33 %
Lymphs Abs: 1.5 10*3/uL (ref 0.9–3.3)
MCH: 31.6 pg (ref 27.2–33.4)
MCHC: 33.7 g/dL (ref 32.0–36.0)
MCV: 93.7 fL (ref 79.3–98.0)
Monocytes Absolute: 0.7 10*3/uL (ref 0.1–0.9)
Monocytes Relative: 16 %
NEUTROS ABS: 2.1 10*3/uL (ref 1.5–6.5)
Neutrophils Relative %: 49 %
PLATELETS: 209 10*3/uL (ref 140–400)
RBC: 3.89 MIL/uL — ABNORMAL LOW (ref 4.20–5.82)
RDW: 14.7 % — ABNORMAL HIGH (ref 11.0–14.6)
WBC: 4.4 10*3/uL (ref 4.0–10.3)

## 2018-02-21 LAB — CMP (CANCER CENTER ONLY)
ALBUMIN: 3.4 g/dL — AB (ref 3.5–5.0)
ALT: 135 U/L — AB (ref 0–44)
AST: 73 U/L — AB (ref 15–41)
Alkaline Phosphatase: 68 U/L (ref 38–126)
Anion gap: 10 (ref 5–15)
BUN: 13 mg/dL (ref 6–20)
CHLORIDE: 106 mmol/L (ref 98–111)
CO2: 27 mmol/L (ref 22–32)
CREATININE: 0.88 mg/dL (ref 0.61–1.24)
Calcium: 9.7 mg/dL (ref 8.9–10.3)
GFR, Est AFR Am: >60 mL/min (ref >60)
GLUCOSE: 94 mg/dL (ref 70–99)
POTASSIUM: 4.4 mmol/L (ref 3.5–5.1)
Sodium: 143 mmol/L (ref 135–145)
Total Bilirubin: 0.3 mg/dL (ref 0.3–1.2)
Total Protein: 6.7 g/dL (ref 6.5–8.1)

## 2018-02-21 MED ORDER — IOPAMIDOL (ISOVUE-300) INJECTION 61%
INTRAVENOUS | Status: AC
  Filled 2018-02-21: qty 100

## 2018-02-21 MED ORDER — IOPAMIDOL (ISOVUE-300) INJECTION 61%
100.0000 mL | Freq: Once | INTRAVENOUS | Status: AC | PRN
  Administered 2018-02-21: 100 mL via INTRAVENOUS

## 2018-02-21 MED ORDER — GADOBENATE DIMEGLUMINE 529 MG/ML IV SOLN
20.0000 mL | Freq: Once | INTRAVENOUS | Status: AC | PRN
  Administered 2018-02-21: 19 mL via INTRAVENOUS

## 2018-02-22 ENCOUNTER — Telehealth: Payer: Self-pay

## 2018-02-22 NOTE — Telephone Encounter (Signed)
Gery Pray, MD  Loma Sousa, RN        Sharee Pimple, please inform patient of good results on recent brain MRI. Thanks, Stephens November, MD  Loma Sousa, RN        Sharee Pimple, please inform patient of good results on recent cervical spine MRI. Thanks, jk    Contacted pt to convey above messages from Dr. Sondra Come. Pt verbalized appreciation and understanding. Loma Sousa, RN BSN

## 2018-02-27 NOTE — Assessment & Plan Note (Addendum)
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 The patient completed a course of palliative radiotherapy to the cervical spine as well as left hip under the care of Dr. Sondra Come.Marland Kitchen He is currently undergoing systemic chemotherapy with carboplatin, Alimta and Keytruda status post4cycles and then beginning with cycle 5 he was started on maintenance Alimta with Keytruda.  He is status post 6 cycles of his treatment. He continues to tolerate this treatment fairly well with no concerning complaints except for fatigue. He had a restaging CT scan and is here to discuss the results.  The patient was seen with Dr. [name].  CT scan results and images were reviewed with the patient.  We discussed that overall he has stable disease with the exception of one new lesion in the right hepatic lobe.  Treatment options were discussed with the patient including changing his treatment completely versus continuing on the current treatment and asking radiation oncology to evaluate him for possible radiation to this new liver lesion.  Recommend that he continue current treatment and referral to radiation.  If radiation oncology cannot radiate this area, we will need to consider changing his treatment.  The patient will proceed with cycle 7 of his treatment today as scheduled.  He will follow-up in 3 weeks for evaluation prior to cycle #8.  Labs been reviewed.  Liver enzymes are elevated but improved.  Okay to proceed with treatment as scheduled.  We will continue to monitor his liver enzymes.  The patient was advised to call if he has any concerning symptoms in the interval. The patient voices understanding of current disease status and treatment options and is in agreement with the current care plan.  All questions were answered. The patient knows to call the clinic with any problems, questions or  concerns. We can certainly see the patient much sooner if necessary.

## 2018-02-27 NOTE — Progress Notes (Signed)
[facility] OFFICE PROGRESS NOTE  Anthony Cha, MD [address] [place] [address]  DIAGNOSIS:Stage IVB(T1b, N2, M1c)non-small cell lung cancer, adenocarcinoma presented with right upper lobe lung nodule in addition to right hilar and mediastinal lymphadenopathy as well as metastatic liver and bone disease diagnosed in February 2019.  Biomarker Findings Microsatellite status - MS-Stable Tumor Mutational Burden - TMB-Low (4 Muts/Mb) Genomic Findings For a complete list of the genes assayed, please refer to the Appendix. EGFR exon 20 insertion ([id number]) RB1 loss exons 9-17 TP53 [id number] 7 Disease relevant genes with no reportable alterations: KRAS, ALK, BRAF, MET, RET, ERBB2, ROS1  PDL 1 expression5%  PRIOR THERAPY: Palliative radiotherapy to the metastatic bone lesions in the cervical spine and left femur under the care of Dr. Sondra Come.  CURRENT THERAPY: Systemic chemotherapy with carboplatin for AUC of 5, Alimta 500 mg/M2 and Keytruda 200 mg IV every 3 weeks. First dose October 04, 2017.  Started with cycle #5, the patient continued on maintenance Alimta and Keytruda.  He is status post 6 cycles of treatment.  INTERVAL HISTORY: Anthony [name] 47 y.o. male returns for routine follow-up visit by himself.  The patient reports that he is feeling fine overall but does have fatigue.  He states that he has not had much of an appetite but he is forcing himself to eat.  He is gained 9 pounds since his last visit.  He denies fevers and chills.  Denies chest pain, shortness of breath, cough, hemoptysis.  Denies nausea, vomiting, constipation, diarrhea.  The patient had a restaging CT scan and is here to discuss the results.  MEDICAL HISTORY: Past Medical History:  Diagnosis Date  . Acid reflux   . History of radiation therapy 09/21/17-10/04/17   C4 spine 30 Gy in 10 fractions, left femur 30 Gy in 10 fractions  . Non-small cell lung cancer ([facility])   .  NSTEMI (non-ST elevated myocardial infarction) ([facility]) 06/13/2017   [name] 06/14/2017  . Tailbone injury since 1993   "cracked"    ALLERGIES:  has No Known Allergies.  MEDICATIONS:  Current Outpatient Medications  Medication Sig Dispense Refill  . acetaminophen (TYLENOL) 500 MG tablet Take 500-1,000 mg by mouth daily as needed for moderate pain or headache.    Marland Kitchen aspirin EC 81 MG EC tablet Take 1 tablet (81 mg total) by mouth daily.    Marland Kitchen atorvastatin (LIPITOR) 40 MG tablet Take 1 tablet (40 mg total) by mouth daily. 90 tablet 3  . cetirizine (ZYRTEC) 10 MG tablet Take 10 mg by mouth daily as needed for allergies.     Marland Kitchen clopidogrel (PLAVIX) 75 MG tablet TAKE 1 TABLET BY MOUTH EVERY DAY 30 tablet 9  . esomeprazole (NEXIUM) 20 MG capsule Take 20 mg by mouth every other day.    . folic acid (FOLVITE) 1 MG tablet Take 1 tablet (1 mg total) by mouth daily. 30 tablet 0  . lisinopril (PRINIVIL,ZESTRIL) 5 MG tablet TAKE 1 TABLET BY MOUTH EVERY DAY 90 tablet 3  . metoprolol tartrate (LOPRESSOR) 25 MG tablet TAKE 1/2 TABLET BY MOUTH 2 TIMES DAILY. (Patient taking differently: TAKE 1/2 TABLET  (12.5 mg) BY MOUTH 2 TIMES DAILY.) 30 tablet 9  . mirtazapine (REMERON) 30 MG tablet Take 1 tablet (30 mg total) by mouth at bedtime. 30 tablet 2  . chlorproMAZINE (THORAZINE) 25 MG tablet Take 1 tablet (25 mg total) by mouth 3 (three) times daily as needed. (Patient not taking: Reported on 02/07/2018)  30 tablet 0  . prochlorperazine (COMPAZINE) 10 MG tablet Take 1 tablet (10 mg total) by mouth every 6 (six) hours as needed for nausea or vomiting. (Patient not taking: Reported on 02/07/2018) 30 tablet 1   No current facility-administered medications for this visit.     SURGICAL HISTORY:  Past Surgical History:  Procedure Laterality Date  . CARDIAC CATHETERIZATION  06/14/2017  . CHOLECYSTECTOMY N/A 12/29/2017   Procedure: LAPAROSCOPIC CHOLECYSTECTOMY;  Surgeon: Ralene Ok, MD;  Location: [facility];  Service:  General;  Laterality: N/A;  . CHOLECYSTECTOMY, LAPAROSCOPIC N/A 12/29/2017  . CORONARY ARTERY BYPASS GRAFT N/A 06/19/2017   Procedure: CORONARY ARTERY BYPASS GRAFTING (CABG), OFF PUMP, times one using the left internal mammary artery to LAD;  Surgeon: Grace Isaac, MD;  Location: [facility];  Service: Open Heart Surgery;  Laterality: N/A;  . ESOPHAGOGASTRODUODENOSCOPY (EGD) WITH PROPOFOL N/A 11/30/2017   Procedure: ESOPHAGOGASTRODUODENOSCOPY (EGD) WITH PROPOFOL;  Surgeon: Gatha Mayer, MD;  Location: [facility];  Service: Endoscopy;  Laterality: N/A;  . LEFT HEART CATH AND CORONARY ANGIOGRAPHY N/A 06/14/2017   Procedure: LEFT HEART CATH AND CORONARY ANGIOGRAPHY;  Surgeon: Sherren Mocha, MD;  Location: [facility];  Service: Cardiovascular;  Laterality: N/A;  . TEE WITHOUT CARDIOVERSION N/A 06/19/2017   Procedure: TRANSESOPHAGEAL ECHOCARDIOGRAM (TEE);  Surgeon: Grace Isaac, MD;  Location: [facility];  Service: Open Heart Surgery;  Laterality: N/A;  . TONSILLECTOMY  ~ 1977    REVIEW OF SYSTEMS:   Review of Systems  Constitutional: Negative for chills, fever. Positive for fatigue, decreased appetite, and weight gain. HENT:   Negative for mouth sores, nosebleeds, sore throat and trouble swallowing.   Eyes: Negative for eye problems and icterus.  Respiratory: Negative for cough, hemoptysis, shortness of breath and wheezing.   Cardiovascular: Negative for chest pain and leg swelling.  Gastrointestinal: Negative for abdominal pain, constipation, diarrhea, nausea and vomiting.  Genitourinary: Negative for bladder incontinence, difficulty urinating, dysuria, frequency and hematuria.   Musculoskeletal: Negative for back pain, gait problem, neck pain and neck stiffness.  Skin: Negative for itching and rash.  Neurological: Negative for dizziness, extremity weakness, gait problem, headaches, light-headedness and seizures.  Hematological: Negative for adenopathy. Does not bruise/bleed  easily.  Psychiatric/Behavioral: Negative for confusion, depression and sleep disturbance. The patient is not nervous/anxious.     PHYSICAL EXAMINATION:  Blood pressure 122/89, pulse 92, temperature 98.6 F (37 C), temperature source Oral, resp. rate 17, height '5\' 10"'$  (1.778 m), weight 210 lb 4.8 oz (95.4 kg), SpO2 99 %.  ECOG PERFORMANCE STATUS: 1 - Symptomatic but completely ambulatory  Physical Exam  Constitutional: Oriented to person, place, and time and well-developed, well-nourished, and in no distress. No distress.  HENT:  Head: Normocephalic and atraumatic.  Mouth/Throat: Oropharynx is clear and moist. No oropharyngeal exudate.  Eyes: Conjunctivae are normal. Right eye exhibits no discharge. Left eye exhibits no discharge. No scleral icterus.  Neck: Normal range of motion. Neck supple.  Cardiovascular: Normal rate, regular rhythm, normal heart sounds and intact distal pulses.   Pulmonary/Chest: Effort normal and breath sounds normal. No respiratory distress. No wheezes. No rales.  Abdominal: Soft. Bowel sounds are normal. Exhibits no distension and no mass. There is no tenderness.  Musculoskeletal: Normal range of motion. Exhibits no edema.  Lymphadenopathy:    No cervical adenopathy.  Neurological: Alert and oriented to person, place, and time. Exhibits normal muscle tone. Gait normal. Coordination normal.  Skin: Skin is warm and dry. No rash noted. Not  diaphoretic. No erythema. No pallor.  Psychiatric: Mood, memory and judgment normal.  Vitals reviewed.  LABORATORY DATA: Lab Results  Component Value Date   WBC 3.6 (L) 02/28/2018   HGB 12.4 (L) 02/28/2018   HCT 36.3 (L) 02/28/2018   MCV 93.6 02/28/2018   PLT 297 02/28/2018      Chemistry      Component Value Date/Time   NA 144 02/28/2018 0922   NA 141 07/17/2017 1006   K 4.2 02/28/2018 0922   CL 108 02/28/2018 0922   CO2 27 02/28/2018 0922   BUN 14 02/28/2018 0922   BUN 12 07/17/2017 1006   CREATININE 0.89  02/28/2018 0922      Component Value Date/Time   CALCIUM 9.6 02/28/2018 0922   ALKPHOS 73 02/28/2018 0922   AST 44 (H) 02/28/2018 0922   ALT 84 (H) 02/28/2018 0922   BILITOT 0.4 02/28/2018 0922       RADIOGRAPHIC STUDIES:  Ct Chest W Contrast  Result Date: 02/21/2018 CLINICAL DATA:  Lung cancer. Elevated liver enzymes, decreased white blood cell count. Radiation therapy to left hip and thigh. Ongoing chemotherapy. EXAM: CT CHEST, ABDOMEN, AND PELVIS WITH CONTRAST TECHNIQUE: Multidetector CT imaging of the chest, abdomen and pelvis was performed following the standard protocol during bolus administration of intravenous contrast. CONTRAST:  126m ISOVUE-300 IOPAMIDOL (ISOVUE-300) INJECTION 61% COMPARISON:  11/28/2017 and PET 08/29/2017.  MR abdomen 09/04/2017. FINDINGS: CT CHEST FINDINGS Cardiovascular: Vascular structures are unremarkable. Heart size normal. No pericardial effusion. Mediastinum/Nodes: No pathologically enlarged mediastinal, hilar or axillary lymph nodes. Esophagus is grossly unremarkable. Lungs/Pleura: Ill-defined peribronchovascular nodularity and ground-glass in the right upper and right lower lobes are new from 11/28/2017. Nodular soft tissue in the anterior segment right upper lobe measures 0.8 x 1.4 cm, with surrounding architectural distortion and ground-glass, stable. Lungs are otherwise clear. No pleural fluid. Airway is unremarkable. Musculoskeletal: Degenerative changes in the spine. No worrisome lytic or sclerotic lesions. CT ABDOMEN PELVIS FINDINGS Hepatobiliary: A 1.5 cm intermediate density lesion in segment 4 of the liver is grossly stable from 11/28/2017. A slightly exophytic hypoattenuating lesion along the posterior dome of the liver measures 2.5 x 2.9 cm and is likely new from 11/28/2017 and definitely new from 09/04/2017. 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  Cholecystectomy. No biliary ductal dilatation. Pancreas: Negative. Spleen: Negative. Adrenals/Urinary Tract: Adrenal glands and kidneys are unremarkable. Ureters are decompressed. Bladder grossly unremarkable. Stomach/Bowel: Stomach, small bowel, appendix and colon are unremarkable. Vascular/Lymphatic: Vascular structures are unremarkable. No pathologically enlarged lymph nodes. Reproductive: Prostate is visualized. Other: No free fluid.  Mesenteries and peritoneum are unremarkable. Musculoskeletal: A lucent lesion in the right L4 superior facet is unchanged from 08/29/2017. No worrisome lytic or sclerotic lesions. IMPRESSION: 1. New right hepatic lobe mass is most indicative a metastasis. Segment 4 left hepatic lobe lesion is grossly stable. 2. Nodular primary lesion with surrounding architectural distortion in the right upper lobe, stable. 3. Ill-defined peribronchovascular nodularity and ground-glass in the right upper and right lower lobes, new from 11/28/2017 and likely indicative of an infectious bronchiolitis. Electronically Signed   By: MLorin PicketM.D.   On: 02/21/2018 15:04   Mr BJeri CosWCLContrast  Result Date: 02/21/2018 CLINICAL DATA:  Initial evaluation for stage IV lung cancer with osseous lesion. Evaluate for metastatic disease. Recent increase in headaches. EXAM: MRI HEAD WITHOUT AND WITH CONTRAST MRI CERVICAL SPINE WITHOUT AND WITH  CONTRAST TECHNIQUE: Multiplanar, multiecho pulse sequences of the brain and surrounding structures, and cervical spine, to include the craniocervical junction and cervicothoracic junction, were obtained without and with intravenous contrast. CONTRAST:  35m MULTIHANCE GADOBENATE DIMEGLUMINE 529 MG/ML IV SOLN COMPARISON:  Prior brain MRI from 09/15/2017 and MRI of the cervical spine from 09/13/2017. FINDINGS: MRI HEAD FINDINGS Brain: Cerebral volume stable, and within normal limits for age. Few scattered subcentimeter T2/FLAIR hyperintense foci noted within the  periventricular, deep, and subcortical white matter both cerebral hemispheres, nonspecific, but felt to be within normal limits for age, and relatively stable from previous. No evidence for acute infarct. No acute or chronic intracranial hemorrhage. No areas of chronic infarction. No mass lesion, midline shift or mass effect. No hydrocephalus. No extra-axial fluid collection. No abnormal enhancement. No evidence for intracranial metastatic disease. Pituitary gland suprasellar region normal. Midline structures intact and normal. Vascular: Major intracranial vascular flow voids well maintained and normal. Skull and upper cervical spine: Craniocervical junction within normal limits. No focal marrow replacing lesion within the calvarium or skull base. Sinuses/Orbits: Globes and orbital soft tissues within normal limits. Mild mucosal thickening throughout the paranasal sinuses. No air-fluid level to suggest acute sinusitis. No mastoid effusion. Inner ear structures normal. Other: None. MRI CERVICAL SPINE FINDINGS Alignment: Vertebral bodies normally aligned with preservation of the normal cervical lordosis. No listhesis. Vertebrae: Previously identified metastatic lesion involving the posterior aspect of the C4 vertebral body again seen. Since previous exam, lesion is decreased in size and more well-defined in nature, now measuring approximate 1 cm in size (series 18, image 6). Lesion continues to enhance, although previously seen surrounding marrow enhancement within the adjacent C4 vertebral body has resolved. No appreciable cortical destruction or extra osseous extension of tumor. Superimposed endplate Schmorl's node without pathologic fracture. No other focal osseous lesions identified. Previously noted marrow edema and enhancement at the T1 vertebral body not seen on today's exam, suggesting previous finding was likely degenerative in nature. No other abnormal marrow edema or enhancement. Cord: Signal intensity  within the cervical spinal cord is normal. Posterior Fossa, vertebral arteries, paraspinal tissues: Craniocervical junction within normal limits. Paraspinous and prevertebral soft tissues unremarkable. Normal intravascular flow voids present within the vertebral arteries bilaterally. Disc levels: C2-C3: Unremarkable. C3-C4: Mild bilateral uncovertebral hypertrophy with borderline mild right C4 foraminal stenosis. Central canal and left neural foramina remain widely patent. C4-C5: Mild disc bulge with uncovertebral spurring. Borderline mild bilateral foraminal narrowing without spinal stenosis. C5-C6: Minimal disc bulge with uncovertebral spurring. No canal or foraminal stenosis. C6-C7: Shallow central disc protrusion indents the ventral thecal sac. Mild bilateral uncovertebral hypertrophy. Borderline mild spinal stenosis with left C7 foraminal narrowing. C7-T1: Central disc protrusion indents the ventral thecal sac. Borderline mild spinal stenosis. Foramina remain patent. Visualized upper thoracic spine within normal limits. IMPRESSION: MRI HEAD IMPRESSION: Stable normal brain MRI for age. No intracranial metastatic disease or other acute intracranial abnormality. MRI CERVICAL SPINE IMPRESSION: 1. Interval decrease in size of C4 vertebral body metastatic lesion, compatible with interval response to therapy. No extra osseous tumor or other complication identified. 2. No other new metastatic lesions within the cervical spine. Previously noted small focus of enhancement at the upper aspect of the T1 vertebral body not seen on today's exam, suggesting this was most likely degenerative in nature. 3. 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 Electronically Signed   By: BJeannine BogaM.D.   On: 02/21/2018 17:18   Mr  Cervical Spine W Wo Contrast  Result Date: 02/21/2018 CLINICAL DATA:  Initial evaluation for stage IV lung cancer with osseous  lesion. Evaluate for metastatic disease. Recent increase in headaches. EXAM: MRI HEAD WITHOUT AND WITH CONTRAST MRI CERVICAL SPINE WITHOUT AND WITH CONTRAST TECHNIQUE: Multiplanar, multiecho pulse sequences of the brain and surrounding structures, and cervical spine, to include the craniocervical junction and cervicothoracic junction, were obtained without and with intravenous contrast. CONTRAST:  109m MULTIHANCE GADOBENATE DIMEGLUMINE 529 MG/ML IV SOLN COMPARISON:  Prior brain MRI from 09/15/2017 and MRI of the cervical spine from 09/13/2017. FINDINGS: MRI HEAD FINDINGS Brain: Cerebral volume stable, and within normal limits for age. Few scattered subcentimeter T2/FLAIR hyperintense foci noted within the periventricular, deep, and subcortical white matter both cerebral hemispheres, nonspecific, but felt to be within normal limits for age, and relatively stable from previous. No evidence for acute infarct. No acute or chronic intracranial hemorrhage. No areas of chronic infarction. No mass lesion, midline shift or mass effect. No hydrocephalus. No extra-axial fluid collection. No abnormal enhancement. No evidence for intracranial metastatic disease. Pituitary gland suprasellar region normal. Midline structures intact and normal. Vascular: Major intracranial vascular flow voids well maintained and normal. Skull and upper cervical spine: Craniocervical junction within normal limits. No focal marrow replacing lesion within the calvarium or skull base. Sinuses/Orbits: Globes and orbital soft tissues within normal limits. Mild mucosal thickening throughout the paranasal sinuses. No air-fluid level to suggest acute sinusitis. No mastoid effusion. Inner ear structures normal. Other: None. MRI CERVICAL SPINE FINDINGS Alignment: Vertebral bodies normally aligned with preservation of the normal cervical lordosis. No listhesis. Vertebrae: Previously identified metastatic lesion involving the posterior aspect of the C4  vertebral body again seen. Since previous exam, lesion is decreased in size and more well-defined in nature, now measuring approximate 1 cm in size (series 18, image 6). Lesion continues to enhance, although previously seen surrounding marrow enhancement within the adjacent C4 vertebral body has resolved. No appreciable cortical destruction or extra osseous extension of tumor. Superimposed endplate Schmorl's node without pathologic fracture. No other focal osseous lesions identified. Previously noted marrow edema and enhancement at the T1 vertebral body not seen on today's exam, suggesting previous finding was likely degenerative in nature. No other abnormal marrow edema or enhancement. Cord: Signal intensity within the cervical spinal cord is normal. Posterior Fossa, vertebral arteries, paraspinal tissues: Craniocervical junction within normal limits. Paraspinous and prevertebral soft tissues unremarkable. Normal intravascular flow voids present within the vertebral arteries bilaterally. Disc levels: C2-C3: Unremarkable. C3-C4: Mild bilateral uncovertebral hypertrophy with borderline mild right C4 foraminal stenosis. Central canal and left neural foramina remain widely patent. C4-C5: Mild disc bulge with uncovertebral spurring. Borderline mild bilateral foraminal narrowing without spinal stenosis. C5-C6: Minimal disc bulge with uncovertebral spurring. No canal or foraminal stenosis. C6-C7: Shallow central disc protrusion indents the ventral thecal sac. Mild bilateral uncovertebral hypertrophy. Borderline mild spinal stenosis with left C7 foraminal narrowing. C7-T1: Central disc protrusion indents the ventral thecal sac. Borderline mild spinal stenosis. Foramina remain patent. Visualized upper thoracic spine within normal limits. IMPRESSION: MRI HEAD IMPRESSION: Stable normal brain MRI for age. No intracranial metastatic disease or other acute intracranial abnormality. MRI CERVICAL SPINE IMPRESSION: 1. Interval  decrease in size of C4 vertebral body metastatic lesion, compatible with interval response to therapy. No extra osseous tumor or other complication identified. 2. No other new metastatic lesions within the cervical spine. Previously noted small focus of enhancement at the upper aspect of the T1 vertebral body not seen  on today's exam, suggesting this was most likely degenerative in nature. 3. 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 Electronically Signed   By: Jeannine Boga M.D.   On: 02/21/2018 17:18   Ct Abdomen Pelvis W Contrast  Result Date: 02/21/2018 CLINICAL DATA:  Lung cancer. Elevated liver enzymes, decreased white blood cell count. Radiation therapy to left hip and thigh. Ongoing chemotherapy. EXAM: CT CHEST, ABDOMEN, AND PELVIS WITH CONTRAST TECHNIQUE: Multidetector CT imaging of the chest, abdomen and pelvis was performed following the standard protocol during bolus administration of intravenous contrast. CONTRAST:  159m ISOVUE-300 IOPAMIDOL (ISOVUE-300) INJECTION 61% COMPARISON:  11/28/2017 and PET 08/29/2017.  MR abdomen 09/04/2017. FINDINGS: CT CHEST FINDINGS Cardiovascular: Vascular structures are unremarkable. Heart size normal. No pericardial effusion. Mediastinum/Nodes: No pathologically enlarged mediastinal, hilar or axillary lymph nodes. Esophagus is grossly unremarkable. Lungs/Pleura: Ill-defined peribronchovascular nodularity and ground-glass in the right upper and right lower lobes are new from 11/28/2017. Nodular soft tissue in the anterior segment right upper lobe measures 0.8 x 1.4 cm, with surrounding architectural distortion and ground-glass, stable. Lungs are otherwise clear. No pleural fluid. Airway is unremarkable. Musculoskeletal: Degenerative changes in the spine. No worrisome lytic or sclerotic lesions. CT ABDOMEN PELVIS FINDINGS Hepatobiliary: A 1.5 cm intermediate density lesion in segment 4 of the  liver is grossly stable from 11/28/2017. A slightly exophytic hypoattenuating lesion along the posterior dome of the liver measures 2.5 x 2.9 cm and is likely new from 11/28/2017 and definitely new from 09/04/2017. 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 Cholecystectomy. No biliary ductal dilatation. Pancreas: Negative. Spleen: Negative. Adrenals/Urinary Tract: Adrenal glands and kidneys are unremarkable. Ureters are decompressed. Bladder grossly unremarkable. Stomach/Bowel: Stomach, small bowel, appendix and colon are unremarkable. Vascular/Lymphatic: Vascular structures are unremarkable. No pathologically enlarged lymph nodes. Reproductive: Prostate is visualized. Other: No free fluid.  Mesenteries and peritoneum are unremarkable. Musculoskeletal: A lucent lesion in the right L4 superior facet is unchanged from 08/29/2017. No worrisome lytic or sclerotic lesions. IMPRESSION: 1. New right hepatic lobe mass is most indicative a metastasis. Segment 4 left hepatic lobe lesion is grossly stable. 2. Nodular primary lesion with surrounding architectural distortion in the right upper lobe, stable. 3. Ill-defined peribronchovascular nodularity and ground-glass in the right upper and right lower lobes, new from 11/28/2017 and likely indicative of an infectious bronchiolitis. Electronically Signed   By: MLorin PicketM.D.   On: 02/21/2018 15:04     ASSESSMENT/PLAN:  Stage 4 lung cancer, right ([place] This is a very pleasant 47year old white male with stage IV non-small cell lung cancer, adenocarcinoma with resistant EGFR mutation in exon 20 and PDL 1 expression of 5%. The patient completed a course of palliative radiotherapy to the cervical spine as well as left hip under the care of Dr. KSondra Come.Marland KitchenHe is currently undergoing systemic chemotherapy with carboplatin, Alimta and Keytruda status post4cycles and then  beginning with cycle 5 he was started on maintenance Alimta with Keytruda.  He is status post 6 cycles of his treatment. He continues to tolerate this treatment fairly well with no concerning complaints except for fatigue. He had a restaging CT scan and is here to discuss the results.  The patient was seen with Dr. MJulien Nordmann  CT scan results and images were reviewed with the patient.  We discussed that overall he has stable disease with the exception of one  new lesion in the right hepatic lobe.  Treatment options were discussed with the patient including changing his treatment completely versus continuing on the current treatment and asking radiation oncology to evaluate him for possible radiation to this new liver lesion.  Recommend that he continue current treatment and referral to radiation.  If radiation oncology cannot radiate this area, we will need to consider changing his treatment.  The patient will proceed with cycle 7 of his treatment today as scheduled.  He will follow-up in 3 weeks for evaluation prior to cycle #8.  Labs been reviewed.  Liver enzymes are elevated but improved.  Okay to proceed with treatment as scheduled.  We will continue to monitor his liver enzymes.  The patient was advised to call if he has any concerning symptoms in the interval. The patient voices understanding of current disease status and treatment options and is in agreement with the current care plan.  All questions were answered. The patient knows to call the clinic with any problems, questions or concerns. We can certainly see the patient much sooner if necessary.   Orders Placed This Encounter  Procedures  . Ambulatory referral to Radiation Oncology    Referral Priority:   Routine    Referral Type:   Consultation    Referral Reason:   Specialty Services Required    Referred to Provider:   Gery Pray, MD    Requested Specialty:   Radiation Oncology    Number of Visits Requested:   [place], DNP, AGPCNP-BC, AOCNP 02/28/18   ADDENDUM: Hematology/Oncology Attending: I had a face-to-face encounter with the patient.  I recommended his care plan.  This is a very pleasant 47 years old white male with metastatic non-small cell lung cancer, adenocarcinoma.  The patient completed induction systemic chemotherapy with carboplatin, Alimta and Ketruda (pembrolizumab) and is currently undergoing maintenance treatment with Alimta and Ketruda (pembrolizumab) status post 2 cycles.  He has been tolerating the treatment well except for fatigue and some depression.  He is currently on treatment with Remeron and feeling better. He had repeat CT scan of the chest, abdomen and pelvis performed recently.  I personally and independently reviewed the scan images and discussed the results and showed the images to the patient.  His a scan showed stable disease except for new lesion in the liver measuring up to 2.5 cm. I discussed with the patient several options including changing systemic chemotherapy to a different regimen versus proceeding with the same regimen and referring the patient to radiation oncology for consideration of his stereotactic radiotherapy to the progressive lesion. The patient would like to continue his current treatment for now and he will see Dr. Sondra Come for evaluation of radiotherapy to the progressive lesion. He will proceed with cycle #3 of the maintenance treatment today. He will come back for follow-up visit in 3 weeks for evaluation before the next cycle of his treatment. If the patient has any further evidence of disease progression on the upcoming scan, we will consider discontinuing this treatment and switching to second line systemic chemotherapy. The patient was advised to call immediately if he has any concerning symptoms in the interval.  Disclaimer: This note was dictated with voice recognition software. Similar sounding words can inadvertently be transcribed and may be  missed upon review. Eilleen Kempf, MD 03/02/18

## 2018-02-28 ENCOUNTER — Inpatient Hospital Stay: Payer: BLUE CROSS/BLUE SHIELD

## 2018-02-28 ENCOUNTER — Inpatient Hospital Stay (HOSPITAL_BASED_OUTPATIENT_CLINIC_OR_DEPARTMENT_OTHER): Payer: BLUE CROSS/BLUE SHIELD | Admitting: Oncology

## 2018-02-28 ENCOUNTER — Telehealth: Payer: Self-pay | Admitting: Internal Medicine

## 2018-02-28 ENCOUNTER — Encounter: Payer: Self-pay | Admitting: Oncology

## 2018-02-28 ENCOUNTER — Encounter: Payer: Self-pay | Admitting: Radiation Oncology

## 2018-02-28 VITALS — BP 122/89 | HR 92 | Temp 98.6°F | Resp 17 | Ht 70.0 in | Wt 210.3 lb

## 2018-02-28 DIAGNOSIS — C787 Secondary malignant neoplasm of liver and intrahepatic bile duct: Secondary | ICD-10-CM

## 2018-02-28 DIAGNOSIS — Z79899 Other long term (current) drug therapy: Secondary | ICD-10-CM

## 2018-02-28 DIAGNOSIS — C7951 Secondary malignant neoplasm of bone: Secondary | ICD-10-CM | POA: Diagnosis not present

## 2018-02-28 DIAGNOSIS — C3491 Malignant neoplasm of unspecified part of right bronchus or lung: Secondary | ICD-10-CM | POA: Diagnosis not present

## 2018-02-28 DIAGNOSIS — Z5111 Encounter for antineoplastic chemotherapy: Secondary | ICD-10-CM

## 2018-02-28 DIAGNOSIS — C3411 Malignant neoplasm of upper lobe, right bronchus or lung: Secondary | ICD-10-CM | POA: Diagnosis not present

## 2018-02-28 DIAGNOSIS — K219 Gastro-esophageal reflux disease without esophagitis: Secondary | ICD-10-CM

## 2018-02-28 DIAGNOSIS — C778 Secondary and unspecified malignant neoplasm of lymph nodes of multiple regions: Secondary | ICD-10-CM

## 2018-02-28 DIAGNOSIS — Z923 Personal history of irradiation: Secondary | ICD-10-CM

## 2018-02-28 DIAGNOSIS — Z5112 Encounter for antineoplastic immunotherapy: Secondary | ICD-10-CM

## 2018-02-28 DIAGNOSIS — I252 Old myocardial infarction: Secondary | ICD-10-CM

## 2018-02-28 LAB — CBC WITH DIFFERENTIAL (CANCER CENTER ONLY)
BASOS ABS: 0 10*3/uL (ref 0.0–0.1)
BASOS PCT: 1 %
Eosinophils Absolute: 0.1 10*3/uL (ref 0.0–0.5)
Eosinophils Relative: 1 %
HEMATOCRIT: 36.3 % — AB (ref 38.4–49.9)
Hemoglobin: 12.4 g/dL — ABNORMAL LOW (ref 13.0–17.1)
LYMPHS PCT: 38 %
Lymphs Abs: 1.4 10*3/uL (ref 0.9–3.3)
MCH: 32 pg (ref 27.2–33.4)
MCHC: 34.2 g/dL (ref 32.0–36.0)
MCV: 93.6 fL (ref 79.3–98.0)
MONO ABS: 0.4 10*3/uL (ref 0.1–0.9)
Monocytes Relative: 11 %
NEUTROS ABS: 1.7 10*3/uL (ref 1.5–6.5)
Neutrophils Relative %: 49 %
PLATELETS: 297 10*3/uL (ref 140–400)
RBC: 3.88 MIL/uL — ABNORMAL LOW (ref 4.20–5.82)
RDW: 14.2 % (ref 11.0–14.6)
WBC: 3.6 10*3/uL — AB (ref 4.0–10.3)

## 2018-02-28 LAB — CMP (CANCER CENTER ONLY)
ALK PHOS: 73 U/L (ref 38–126)
ALT: 84 U/L — AB (ref 0–44)
ANION GAP: 9 (ref 5–15)
AST: 44 U/L — AB (ref 15–41)
Albumin: 3.6 g/dL (ref 3.5–5.0)
BILIRUBIN TOTAL: 0.4 mg/dL (ref 0.3–1.2)
BUN: 14 mg/dL (ref 6–20)
CALCIUM: 9.6 mg/dL (ref 8.9–10.3)
CO2: 27 mmol/L (ref 22–32)
Chloride: 108 mmol/L (ref 98–111)
Creatinine: 0.89 mg/dL (ref 0.61–1.24)
GLUCOSE: 75 mg/dL (ref 70–99)
POTASSIUM: 4.2 mmol/L (ref 3.5–5.1)
Sodium: 144 mmol/L (ref 135–145)
TOTAL PROTEIN: 6.7 g/dL (ref 6.5–8.1)

## 2018-02-28 LAB — TSH: TSH: 1.902 u[IU]/mL (ref 0.320–4.118)

## 2018-02-28 MED ORDER — SODIUM CHLORIDE 0.9 % IV SOLN
510.0000 mg/m2 | Freq: Once | INTRAVENOUS | Status: AC
  Administered 2018-02-28: 1100 mg via INTRAVENOUS
  Filled 2018-02-28: qty 40

## 2018-02-28 MED ORDER — SODIUM CHLORIDE 0.9 % IV SOLN
200.0000 mg | Freq: Once | INTRAVENOUS | Status: AC
  Administered 2018-02-28: 200 mg via INTRAVENOUS
  Filled 2018-02-28: qty 8

## 2018-02-28 MED ORDER — DEXAMETHASONE SODIUM PHOSPHATE 10 MG/ML IJ SOLN
INTRAMUSCULAR | Status: AC
  Filled 2018-02-28: qty 1

## 2018-02-28 MED ORDER — DEXAMETHASONE SODIUM PHOSPHATE 10 MG/ML IJ SOLN
10.0000 mg | Freq: Once | INTRAMUSCULAR | Status: AC
  Administered 2018-02-28: 10 mg via INTRAVENOUS

## 2018-02-28 MED ORDER — SODIUM CHLORIDE 0.9 % IV SOLN
Freq: Once | INTRAVENOUS | Status: AC
  Administered 2018-02-28: 11:00:00 via INTRAVENOUS
  Filled 2018-02-28: qty 250

## 2018-02-28 MED ORDER — ONDANSETRON HCL 4 MG/2ML IJ SOLN
8.0000 mg | Freq: Once | INTRAMUSCULAR | Status: AC
  Administered 2018-02-28: 8 mg via INTRAVENOUS

## 2018-02-28 MED ORDER — ONDANSETRON HCL 4 MG/2ML IJ SOLN
INTRAMUSCULAR | Status: AC
  Filled 2018-02-28: qty 4

## 2018-02-28 NOTE — Progress Notes (Signed)
Okay to treat with elevated liver enzymes per Mikey Bussing NP.  Leanne Chang RN

## 2018-02-28 NOTE — Telephone Encounter (Signed)
appts already scheduled per 7/31 los - no additional appts added.

## 2018-02-28 NOTE — Progress Notes (Signed)
Per Dr. [name] ok to treat on 02/28/18 with ALT 84.

## 2018-03-05 NOTE — Progress Notes (Signed)
Histology and Location of Primary Cancer: Stage IVB(T1b, N2, M1c)non-small cell lung cancer, adenocarcinoma presented with right upper lobe lung nodule in addition to right hilar and mediastinal lymphadenopathy as well as metastatic liver and bone disease diagnosed in February 2019.    Location(s) of Symptomatic tumor(s): CT Abd/Pelvis 02/21/18:  Hepatobiliary: A 1.5 cm intermediate density lesion in segment 4 of the liver is grossly stable from 11/28/2017. A slightly exophytic hypoattenuating lesion along the posterior dome of the liver measures 2.5 x 2.9 cm and is likely new from 11/28/2017 and definitely new from 09/04/2017. 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 Cholecystectomy. No biliary ductal dilatation.  Past/Anticipated chemotherapy by medical oncology, if any: 02/28/18:  CURRENT THERAPY: Systemic chemotherapy with carboplatin for AUC of 5, Alimta 500 mg/M2 and Keytruda 200 mg IV every 3 weeks. First dose October 04, 2017.Started with cycle #5, the patient continued on maintenance Alimta and Keytruda.He is status post 6 cycles of treatment.    Patient's main complaints related to symptomatic tumor(s) are: Anthony [name] 47 y.o. male returns for routine follow-up visit by himself.  The patient reports that he is feeling fine overall but does have fatigue.  He states that he has not had much of an appetite but he is forcing himself to eat.  He is gained 9 pounds since his last visit.  He denies fevers and chills.  Denies chest pain, shortness of breath, cough, hemoptysis.  Denies nausea, vomiting, constipation, diarrhea.  The patient had a restaging CT scan and is here to discuss the results.    Pain on a scale of 0-10 is: Pt c/o abdominal pain that varies throughout the day. Pain is worsened by deep sighs/"reflexive breaths". Current pain rating is "2"/10     Ambulatory status?  Walker? Wheelchair?: Pt with steady gait. Does not use assistive device.   SAFETY ISSUES:  Prior radiation? Yes, left hip/femur and cervical area.  Pacemaker/ICD? No  Possible current pregnancy? N/A  Is the patient on methotrexate? No  Additional Complaints / other details:  Pt presents today for consult with Dr. Sondra Come for Radiation Oncology. Pt is unaccompanied. Loma Sousa, RN BSN

## 2018-03-07 ENCOUNTER — Ambulatory Visit
Admission: RE | Admit: 2018-03-07 | Discharge: 2018-03-07 | Disposition: A | Discharge status: Home / Self Care | Payer: BLUE CROSS/BLUE SHIELD | Source: Ambulatory Visit | Attending: Radiation Oncology | Admitting: Radiation Oncology

## 2018-03-07 ENCOUNTER — Other Ambulatory Visit: Payer: Self-pay

## 2018-03-07 ENCOUNTER — Encounter: Payer: Self-pay | Admitting: *Deleted

## 2018-03-07 ENCOUNTER — Encounter: Payer: Self-pay | Admitting: Radiation Oncology

## 2018-03-07 VITALS — BP 116/77 | HR 93 | Temp 100.9°F | Resp 20 | Ht 70.0 in | Wt 205.8 lb

## 2018-03-07 DIAGNOSIS — C7801 Secondary malignant neoplasm of right lung: Secondary | ICD-10-CM | POA: Diagnosis not present

## 2018-03-07 DIAGNOSIS — Z7902 Long term (current) use of antithrombotics/antiplatelets: Secondary | ICD-10-CM | POA: Diagnosis not present

## 2018-03-07 DIAGNOSIS — Z79899 Other long term (current) drug therapy: Secondary | ICD-10-CM | POA: Insufficient documentation

## 2018-03-07 DIAGNOSIS — R590 Localized enlarged lymph nodes: Secondary | ICD-10-CM | POA: Diagnosis not present

## 2018-03-07 DIAGNOSIS — C3491 Malignant neoplasm of unspecified part of right bronchus or lung: Secondary | ICD-10-CM | POA: Diagnosis present

## 2018-03-07 DIAGNOSIS — C787 Secondary malignant neoplasm of liver and intrahepatic bile duct: Secondary | ICD-10-CM

## 2018-03-07 DIAGNOSIS — Z7982 Long term (current) use of aspirin: Secondary | ICD-10-CM | POA: Diagnosis not present

## 2018-03-07 NOTE — Progress Notes (Signed)
Radiation Oncology         [phone] ________________________________  Name: Anthony [name] MRN: [id number]  Date: 03/07/2018  DOB: [birth year]  Re-Consult Visit Note  CC: [name], MD  [name], MD    ICD-10-CM   1. Stage 4 lung cancer, right [facility]) C34.91     Diagnosis:   Stage IVB(T1b, N2, M1c)non-small cell lung cancer, adenocarcinoma presented with right upper lobe lung nodule in addition to right hilar and mediastinal lymphadenopathy as well as metastatic liver and bone disease diagnosed in February 2019.   Interval Since Last Radiation:   09/21/2017-10/04/2017: C4 Spine treated to 30 Gy in 10 fractions and Left femur treated to 30 Gy in 10 fractions  Narrative:  The patient was last seen in our clinic for routine follow-up on 01/25/2018. Since that time he underwent several re-staging imaging studies. CT C/A/P on 02/21/2018 unfortunately showed a new right hepatic lobe mass most indicative of a metastasis. As well as nodular primary lesion with surrounding architectural distortion in the right upper lobe and ill-defined peribronchovascular nodularity and ground-glass in the right upper and lower lobes, new from 11/28/2017 and likely indicative of an infectious bronchitis. Brain MRI on 02/21/2018 showed no intracranial metastatic disease or other acute intracranial abnormality. Cervical Spine MRI on 02/21/2018 showed interval decrease in size of C4 vertebral body metastatic lesion, compatible with interval response to therapy and no extra osseous tumor or other complication identified. As well as no other new metastatic lesions within the cervical spine.               The patient is currently receiving systemic chemotherapy utilizing carboplatin, alimta, and keytruda with Dr. [name]. He was last seen in medical oncology on 02/28/2018 and is scheduled to follow-up again on 03/21/2018.                 On review of systems, the patient reports that he is doing well overall. He  reports fatigue. He states he has not had much of an appetite lately and has been forcing himself to eat. He has gained 9 lbs since his last visit. He denies fevers or chills. Denies chest pain, shortness of breath, cough or hemoptysis. Denies nausea, vomiting, constipation, or diarrhea. He reports abdominal pain that varies throughout the day. Pain is worsened by deep sighs / "reflexive breaths" and he describes this pain as a 2 out of 10 on pain scale.  A complete review of systems is obtained and is otherwise negative.  The patient is here for further evaluation and discussion of radiation treatment options in the management of his disease.  ALLERGIES:  has No Known Allergies.  Meds: Current Outpatient Medications  Medication Sig Dispense Refill  . acetaminophen (TYLENOL) 500 MG tablet Take 500-1,000 mg by mouth daily as needed for moderate pain or headache.    Marland Kitchen aspirin EC 81 MG EC tablet Take 1 tablet (81 mg total) by mouth daily.    Marland Kitchen atorvastatin (LIPITOR) 40 MG tablet Take 1 tablet (40 mg total) by mouth daily. 90 tablet 3  . cetirizine (ZYRTEC) 10 MG tablet Take 10 mg by mouth daily as needed for allergies.     . chlorproMAZINE (THORAZINE) 25 MG tablet Take 1 tablet (25 mg total) by mouth 3 (three) times daily as needed. 30 tablet 0  . clopidogrel (PLAVIX) 75 MG tablet TAKE 1 TABLET BY MOUTH EVERY DAY 30 tablet 9  . esomeprazole (NEXIUM) 20 MG capsule Take 20 mg by mouth every  other day.    . folic acid (FOLVITE) 1 MG tablet Take 1 tablet (1 mg total) by mouth daily. 30 tablet 0  . lisinopril (PRINIVIL,ZESTRIL) 5 MG tablet TAKE 1 TABLET BY MOUTH EVERY DAY 90 tablet 3  . metoprolol tartrate (LOPRESSOR) 25 MG tablet TAKE 1/2 TABLET BY MOUTH 2 TIMES DAILY. (Patient taking differently: TAKE 1/2 TABLET  (12.5 mg) BY MOUTH 2 TIMES DAILY.) 30 tablet 9  . mirtazapine (REMERON) 30 MG tablet Take 1 tablet (30 mg total) by mouth at bedtime. 30 tablet 2  . prochlorperazine (COMPAZINE) 10 MG tablet  Take 1 tablet (10 mg total) by mouth every 6 (six) hours as needed for nausea or vomiting. 30 tablet 1   No current facility-administered medications for this encounter.    REVIEW OF SYSTEMS: A 10+ POINT REVIEW OF SYSTEMS WAS OBTAINED including neurology, dermatology, psychiatry, cardiac, respiratory, lymph, extremities, GI, GU, musculoskeletal, constitutional, reproductive, HEENT. All pertinent positives are noted in the HPI. All others are negative.  Physical Findings:  height is 5\' 10"  (1.778 m) and weight is 205 lb 12.8 oz (93.4 kg). His oral temperature is 100.9 F (38.3 C) (abnormal). His blood pressure is 116/77 and his pulse is 93. His respiration is 20 and oxygen saturation is 100%. .   General: Alert and oriented, in no acute distress. He is unaccompanied today. HEENT: Head is normocephalic. Extraocular movements are intact. Oropharynx is clear. Neck: Neck is supple, no palpable cervical or supraclavicular lymphadenopathy. Heart: Regular in rate and rhythm with no murmurs, rubs, or gallops. Chest: Clear to auscultation bilaterally, with no rhonchi, wheezes, or rales. Abdomen: Tenderness with palpation of RUQ. No rebound or guarding. No palpable mass appreciated. Extremities: No cyanosis or edema. Lymphatics: see Neck Exam Skin: No concerning lesions. Musculoskeletal: symmetric strength and muscle tone throughout. Neurologic: Cranial nerves II through XII are grossly intact. No obvious focalities. Speech is fluent. Coordination is intact. Psychiatric: Judgment and insight are intact. Affect is appropriate.  Lab Findings: Lab Results  Component Value Date   WBC 3.6 (L) 02/28/2018   HGB 12.4 (L) 02/28/2018   HCT 36.3 (L) 02/28/2018   MCV 93.6 02/28/2018   PLT 297 02/28/2018    Radiographic Findings: Ct Chest W Contrast  Result Date: 02/21/2018 CLINICAL DATA:  Lung cancer. Elevated liver enzymes, decreased white blood cell count. Radiation therapy to left hip and thigh.  Ongoing chemotherapy. EXAM: CT CHEST, ABDOMEN, AND PELVIS WITH CONTRAST TECHNIQUE: Multidetector CT imaging of the chest, abdomen and pelvis was performed following the standard protocol during bolus administration of intravenous contrast. CONTRAST:  157mL ISOVUE-300 IOPAMIDOL (ISOVUE-300) INJECTION 61% COMPARISON:  11/28/2017 and PET 08/29/2017.  MR abdomen 09/04/2017. FINDINGS: CT CHEST FINDINGS Cardiovascular: Vascular structures are unremarkable. Heart size normal. No pericardial effusion. Mediastinum/Nodes: No pathologically enlarged mediastinal, hilar or axillary lymph nodes. Esophagus is grossly unremarkable. Lungs/Pleura: Ill-defined peribronchovascular nodularity and ground-glass in the right upper and right lower lobes are new from 11/28/2017. Nodular soft tissue in the anterior segment right upper lobe measures 0.8 x 1.4 cm, with surrounding architectural distortion and ground-glass, stable. Lungs are otherwise clear. No pleural fluid. Airway is unremarkable. Musculoskeletal: Degenerative changes in the spine. No worrisome lytic or sclerotic lesions. CT ABDOMEN PELVIS FINDINGS Hepatobiliary: A 1.5 cm intermediate density lesion in segment 4 of the liver is grossly stable from 11/28/2017. A slightly exophytic hypoattenuating lesion along the posterior dome of the liver measures 2.5 x 2.9 cm and is likely new from 11/28/2017 and definitely new from  09/04/2017. 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 Cholecystectomy. No biliary ductal dilatation. Pancreas: Negative. Spleen: Negative. Adrenals/Urinary Tract: Adrenal glands and kidneys are unremarkable. Ureters are decompressed. Bladder grossly unremarkable. Stomach/Bowel: Stomach, small bowel, appendix and colon are unremarkable. Vascular/Lymphatic: Vascular structures are unremarkable. No pathologically enlarged lymph nodes. Reproductive: Prostate is  visualized. Other: No free fluid.  Mesenteries and peritoneum are unremarkable. Musculoskeletal: A lucent lesion in the right L4 superior facet is unchanged from 08/29/2017. No worrisome lytic or sclerotic lesions. IMPRESSION: 1. New right hepatic lobe mass is most indicative a metastasis. Segment 4 left hepatic lobe lesion is grossly stable. 2. Nodular primary lesion with surrounding architectural distortion in the right upper lobe, stable. 3. Ill-defined peribronchovascular nodularity and ground-glass in the right upper and right lower lobes, new from 11/28/2017 and likely indicative of an infectious bronchiolitis. Electronically Signed   By: Lorin Picket M.D.   On: 02/21/2018 15:04   Mr Jeri Cos ZO Contrast  Result Date: 02/21/2018 CLINICAL DATA:  Initial evaluation for stage IV lung cancer with osseous lesion. Evaluate for metastatic disease. Recent increase in headaches. EXAM: MRI HEAD WITHOUT AND WITH CONTRAST MRI CERVICAL SPINE WITHOUT AND WITH CONTRAST TECHNIQUE: Multiplanar, multiecho pulse sequences of the brain and surrounding structures, and cervical spine, to include the craniocervical junction and cervicothoracic junction, were obtained without and with intravenous contrast. CONTRAST:  40mL MULTIHANCE GADOBENATE DIMEGLUMINE 529 MG/ML IV SOLN COMPARISON:  Prior brain MRI from 09/15/2017 and MRI of the cervical spine from 09/13/2017. FINDINGS: MRI HEAD FINDINGS Brain: Cerebral volume stable, and within normal limits for age. Few scattered subcentimeter T2/FLAIR hyperintense foci noted within the periventricular, deep, and subcortical white matter both cerebral hemispheres, nonspecific, but felt to be within normal limits for age, and relatively stable from previous. No evidence for acute infarct. No acute or chronic intracranial hemorrhage. No areas of chronic infarction. No mass lesion, midline shift or mass effect. No hydrocephalus. No extra-axial fluid collection. No abnormal enhancement. No  evidence for intracranial metastatic disease. Pituitary gland suprasellar region normal. Midline structures intact and normal. Vascular: Major intracranial vascular flow voids well maintained and normal. Skull and upper cervical spine: Craniocervical junction within normal limits. No focal marrow replacing lesion within the calvarium or skull base. Sinuses/Orbits: Globes and orbital soft tissues within normal limits. Mild mucosal thickening throughout the paranasal sinuses. No air-fluid level to suggest acute sinusitis. No mastoid effusion. Inner ear structures normal. Other: None. MRI CERVICAL SPINE FINDINGS Alignment: Vertebral bodies normally aligned with preservation of the normal cervical lordosis. No listhesis. Vertebrae: Previously identified metastatic lesion involving the posterior aspect of the C4 vertebral body again seen. Since previous exam, lesion is decreased in size and more well-defined in nature, now measuring approximate 1 cm in size (series 18, image 6). Lesion continues to enhance, although previously seen surrounding marrow enhancement within the adjacent C4 vertebral body has resolved. No appreciable cortical destruction or extra osseous extension of tumor. Superimposed endplate Schmorl's node without pathologic fracture. No other focal osseous lesions identified. Previously noted marrow edema and enhancement at the T1 vertebral body not seen on today's exam, suggesting previous finding was likely degenerative in nature. No other abnormal marrow edema or enhancement. Cord: Signal intensity within the cervical spinal cord is normal. Posterior Fossa, vertebral arteries, paraspinal tissues: Craniocervical junction within normal limits. Paraspinous and prevertebral soft tissues unremarkable. Normal intravascular flow voids present within the vertebral arteries bilaterally.  Disc levels: C2-C3: Unremarkable. C3-C4: Mild bilateral uncovertebral hypertrophy with borderline mild right C4 foraminal  stenosis. Central canal and left neural foramina remain widely patent. C4-C5: Mild disc bulge with uncovertebral spurring. Borderline mild bilateral foraminal narrowing without spinal stenosis. C5-C6: Minimal disc bulge with uncovertebral spurring. No canal or foraminal stenosis. C6-C7: Shallow central disc protrusion indents the ventral thecal sac. Mild bilateral uncovertebral hypertrophy. Borderline mild spinal stenosis with left C7 foraminal narrowing. C7-T1: Central disc protrusion indents the ventral thecal sac. Borderline mild spinal stenosis. Foramina remain patent. Visualized upper thoracic spine within normal limits. IMPRESSION: MRI HEAD IMPRESSION: Stable normal brain MRI for age. No intracranial metastatic disease or other acute intracranial abnormality. MRI CERVICAL SPINE IMPRESSION: 1. Interval decrease in size of C4 vertebral body metastatic lesion, compatible with interval response to therapy. No extra osseous tumor or other complication identified. 2. No other new metastatic lesions within the cervical spine. Previously noted small focus of enhancement at the upper aspect of the T1 vertebral body not seen on today's exam, suggesting this was most likely degenerative in nature. 3. 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 Electronically Signed   By: Jeannine Boga M.D.   On: 02/21/2018 17:18   Mr Cervical Spine W Wo Contrast  Result Date: 02/21/2018 CLINICAL DATA:  Initial evaluation for stage IV lung cancer with osseous lesion. Evaluate for metastatic disease. Recent increase in headaches. EXAM: MRI HEAD WITHOUT AND WITH CONTRAST MRI CERVICAL SPINE WITHOUT AND WITH CONTRAST TECHNIQUE: Multiplanar, multiecho pulse sequences of the brain and surrounding structures, and cervical spine, to include the craniocervical junction and cervicothoracic junction, were obtained without and with intravenous contrast. CONTRAST:  9mL  MULTIHANCE GADOBENATE DIMEGLUMINE 529 MG/ML IV SOLN COMPARISON:  Prior brain MRI from 09/15/2017 and MRI of the cervical spine from 09/13/2017. FINDINGS: MRI HEAD FINDINGS Brain: Cerebral volume stable, and within normal limits for age. Few scattered subcentimeter T2/FLAIR hyperintense foci noted within the periventricular, deep, and subcortical white matter both cerebral hemispheres, nonspecific, but felt to be within normal limits for age, and relatively stable from previous. No evidence for acute infarct. No acute or chronic intracranial hemorrhage. No areas of chronic infarction. No mass lesion, midline shift or mass effect. No hydrocephalus. No extra-axial fluid collection. No abnormal enhancement. No evidence for intracranial metastatic disease. Pituitary gland suprasellar region normal. Midline structures intact and normal. Vascular: Major intracranial vascular flow voids well maintained and normal. Skull and upper cervical spine: Craniocervical junction within normal limits. No focal marrow replacing lesion within the calvarium or skull base. Sinuses/Orbits: Globes and orbital soft tissues within normal limits. Mild mucosal thickening throughout the paranasal sinuses. No air-fluid level to suggest acute sinusitis. No mastoid effusion. Inner ear structures normal. Other: None. MRI CERVICAL SPINE FINDINGS Alignment: Vertebral bodies normally aligned with preservation of the normal cervical lordosis. No listhesis. Vertebrae: Previously identified metastatic lesion involving the posterior aspect of the C4 vertebral body again seen. Since previous exam, lesion is decreased in size and more well-defined in nature, now measuring approximate 1 cm in size (series 18, image 6). Lesion continues to enhance, although previously seen surrounding marrow enhancement within the adjacent C4 vertebral body has resolved. No appreciable cortical destruction or extra osseous extension of tumor. Superimposed endplate Schmorl's  node without pathologic fracture. No other focal osseous lesions identified. Previously noted marrow edema and enhancement at the T1 vertebral body not seen on today's exam, suggesting previous finding was likely degenerative in nature.  No other abnormal marrow edema or enhancement. Cord: Signal intensity within the cervical spinal cord is normal. Posterior Fossa, vertebral arteries, paraspinal tissues: Craniocervical junction within normal limits. Paraspinous and prevertebral soft tissues unremarkable. Normal intravascular flow voids present within the vertebral arteries bilaterally. Disc levels: C2-C3: Unremarkable. C3-C4: Mild bilateral uncovertebral hypertrophy with borderline mild right C4 foraminal stenosis. Central canal and left neural foramina remain widely patent. C4-C5: Mild disc bulge with uncovertebral spurring. Borderline mild bilateral foraminal narrowing without spinal stenosis. C5-C6: Minimal disc bulge with uncovertebral spurring. No canal or foraminal stenosis. C6-C7: Shallow central disc protrusion indents the ventral thecal sac. Mild bilateral uncovertebral hypertrophy. Borderline mild spinal stenosis with left C7 foraminal narrowing. C7-T1: Central disc protrusion indents the ventral thecal sac. Borderline mild spinal stenosis. Foramina remain patent. Visualized upper thoracic spine within normal limits. IMPRESSION: MRI HEAD IMPRESSION: Stable normal brain MRI for age. No intracranial metastatic disease or other acute intracranial abnormality. MRI CERVICAL SPINE IMPRESSION: 1. Interval decrease in size of C4 vertebral body metastatic lesion, compatible with interval response to therapy. No extra osseous tumor or other complication identified. 2. No other new metastatic lesions within the cervical spine. Previously noted small focus of enhancement at the upper aspect of the T1 vertebral body not seen on today's exam, suggesting this was most likely degenerative in nature. 3. Mild multilevel  cervical spondylolysis with small disc protrusions at C6-7 and C7-T1, stable from previous. Electronically Signed   By: Jeannine Boga M.D.   On: 02/21/2018 17:18   Ct Abdomen Pelvis W Contrast  Result Date: 02/21/2018 CLINICAL DATA:  Lung cancer. Elevated liver enzymes, decreased white blood cell count. Radiation therapy to left hip and thigh. Ongoing chemotherapy. EXAM: CT CHEST, ABDOMEN, AND PELVIS WITH CONTRAST TECHNIQUE: Multidetector CT imaging of the chest, abdomen and pelvis was performed following the standard protocol during bolus administration of intravenous contrast. CONTRAST:  125mL ISOVUE-300 IOPAMIDOL (ISOVUE-300) INJECTION 61% COMPARISON:  11/28/2017 and PET 08/29/2017.  MR abdomen 09/04/2017. FINDINGS: CT CHEST FINDINGS Cardiovascular: Vascular structures are unremarkable. Heart size normal. No pericardial effusion. Mediastinum/Nodes: No pathologically enlarged mediastinal, hilar or axillary lymph nodes. Esophagus is grossly unremarkable. Lungs/Pleura: Ill-defined peribronchovascular nodularity and ground-glass in the right upper and right lower lobes are new from 11/28/2017. Nodular soft tissue in the anterior segment right upper lobe measures 0.8 x 1.4 cm, with surrounding architectural distortion and ground-glass, stable. Lungs are otherwise clear. No pleural fluid. Airway is unremarkable. Musculoskeletal: Degenerative changes in the spine. No worrisome lytic or sclerotic lesions. CT ABDOMEN PELVIS FINDINGS Hepatobiliary: A 1.5 cm intermediate density lesion in segment 4 of the liver is grossly stable from 11/28/2017. A slightly exophytic hypoattenuating lesion along the posterior dome of the liver measures 2.5 x 2.9 cm and is likely new from 11/28/2017 and definitely new from 09/04/2017. 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 Cholecystectomy. No biliary ductal dilatation. Pancreas:  Negative. Spleen: Negative. Adrenals/Urinary Tract: Adrenal glands and kidneys are unremarkable. Ureters are decompressed. Bladder grossly unremarkable. Stomach/Bowel: Stomach, small bowel, appendix and colon are unremarkable. Vascular/Lymphatic: Vascular structures are unremarkable. No pathologically enlarged lymph nodes. Reproductive: Prostate is visualized. Other: No free fluid.  Mesenteries and peritoneum are unremarkable. Musculoskeletal: A lucent lesion in the right L4 superior facet is unchanged from 08/29/2017. No worrisome lytic or sclerotic lesions. IMPRESSION: 1. New right hepatic lobe mass is most indicative a metastasis. Segment 4 left hepatic lobe lesion is grossly  stable. 2. Nodular primary lesion with surrounding architectural distortion in the right upper lobe, stable. 3. Ill-defined peribronchovascular nodularity and ground-glass in the right upper and right lower lobes, new from 11/28/2017 and likely indicative of an infectious bronchiolitis. Electronically Signed   By: Lorin Picket M.D.   On: 02/21/2018 15:04    Impression: Stage IVB(T1b, N2, M1c)non-small cell lung cancer, adenocarcinoma presented with right upper lobe lung nodule in addition to right hilar and mediastinal lymphadenopathy as well as metastatic liver and bone disease diagnosed in February 2019.   The patient's disease has been stable on his most recent scans of the C/A/P, however he has one new area in the posterior aspect of his liver which developed during his most recent systemic treatment. I have carefully reviewed his tumor location and have discussed with colleagues, he would be a good candidate for SBRT  (Oligometastatic disease). Patient has an excellent performance status continues to work full-time.  Today, I talked to the patient about the findings and work-up thus far.  We discussed the natural history of metastatic lung cancer (Oligometastatic disease) and general treatment, highlighting the role of  radiotherapy in the management.  We discussed the available radiation techniques, and focused on the details of logistics and delivery.  We reviewed the anticipated acute and late sequelae associated with radiation in this setting.  The patient was encouraged to ask questions that I answered to the best of my ability. The patient would like to proceed with radiation and will be scheduled for CT simulation.  Plan:  Patient will be scheduled for SBRT simulation on August 14th at [place].   -----------------------------------  Blair Promise, PhD, MD  This document serves as a record of services personally performed by Gery Pray, MD. It was created on his behalf by Arlyce Harman, a trained medical scribe. The creation of this record is based on the scribe's personal observations and the provider's statements to them. This document has been checked and approved by the attending provider.

## 2018-03-07 NOTE — Progress Notes (Signed)
Patient seen for follow up counseling session today. Reviewed goals for improved communication with family and friends and learning how to cope with cancer diagnosis.  Patient will schedule follow up with CSW once treatment appointments are scheduled.  Maryjean Morn, MSW, LCSW, OSW-C Clinical Social Worker [facility] [phone]

## 2018-03-14 ENCOUNTER — Ambulatory Visit
Admission: RE | Admit: 2018-03-14 | Discharge: 2018-03-14 | Disposition: A | Discharge status: Home / Self Care | Payer: BLUE CROSS/BLUE SHIELD | Source: Ambulatory Visit | Attending: Radiation Oncology | Admitting: Radiation Oncology

## 2018-03-14 ENCOUNTER — Telehealth: Payer: Self-pay | Admitting: *Deleted

## 2018-03-14 DIAGNOSIS — Z510 Encounter for antineoplastic radiation therapy: Secondary | ICD-10-CM | POA: Insufficient documentation

## 2018-03-14 DIAGNOSIS — C7951 Secondary malignant neoplasm of bone: Secondary | ICD-10-CM | POA: Diagnosis not present

## 2018-03-14 DIAGNOSIS — C3411 Malignant neoplasm of upper lobe, right bronchus or lung: Secondary | ICD-10-CM | POA: Insufficient documentation

## 2018-03-14 DIAGNOSIS — C787 Secondary malignant neoplasm of liver and intrahepatic bile duct: Secondary | ICD-10-CM | POA: Insufficient documentation

## 2018-03-14 NOTE — Telephone Encounter (Signed)
Oncology Nurse Navigator Documentation  Oncology Nurse Navigator Flowsheets 03/14/2018  Navigator Location [facility]  Navigator Encounter Type Telephone/I received a call from [facility].  He has some concerns about job [place].  He is currently in corrective action and has to abide by an action plan.  He states he has not missed any work or taken time off due to his stage IV lung cancer treatment regimen.  I updated him on Cancer and Careers phone number and web site to help with questions.  I also reached out to CSW for assistance. He was thankful for the help.    Telephone Incoming Call;Outgoing Call  Treatment Phase Treatment  Barriers/Navigation Needs Education;Coordination of Care  Education Other  Interventions Coordination of Care;Education  Coordination of Care Other  Education Method Verbal  Acuity Level 2  Time Spent with Patient 108

## 2018-03-21 ENCOUNTER — Telehealth: Payer: Self-pay | Admitting: Oncology

## 2018-03-21 ENCOUNTER — Inpatient Hospital Stay: Payer: BLUE CROSS/BLUE SHIELD

## 2018-03-21 ENCOUNTER — Encounter: Payer: Self-pay | Admitting: Oncology

## 2018-03-21 ENCOUNTER — Other Ambulatory Visit: Payer: Self-pay | Admitting: Internal Medicine

## 2018-03-21 ENCOUNTER — Inpatient Hospital Stay (HOSPITAL_BASED_OUTPATIENT_CLINIC_OR_DEPARTMENT_OTHER): Payer: BLUE CROSS/BLUE SHIELD | Admitting: Oncology

## 2018-03-21 ENCOUNTER — Inpatient Hospital Stay: Payer: BLUE CROSS/BLUE SHIELD | Attending: Internal Medicine

## 2018-03-21 ENCOUNTER — Encounter: Payer: Self-pay | Admitting: *Deleted

## 2018-03-21 VITALS — BP 119/79 | HR 84 | Temp 98.3°F | Resp 18 | Ht 70.0 in | Wt 209.0 lb

## 2018-03-21 DIAGNOSIS — C3411 Malignant neoplasm of upper lobe, right bronchus or lung: Secondary | ICD-10-CM | POA: Insufficient documentation

## 2018-03-21 DIAGNOSIS — Z79899 Other long term (current) drug therapy: Secondary | ICD-10-CM | POA: Diagnosis not present

## 2018-03-21 DIAGNOSIS — I252 Old myocardial infarction: Secondary | ICD-10-CM

## 2018-03-21 DIAGNOSIS — C7951 Secondary malignant neoplasm of bone: Secondary | ICD-10-CM | POA: Diagnosis not present

## 2018-03-21 DIAGNOSIS — C787 Secondary malignant neoplasm of liver and intrahepatic bile duct: Secondary | ICD-10-CM | POA: Diagnosis not present

## 2018-03-21 DIAGNOSIS — K219 Gastro-esophageal reflux disease without esophagitis: Secondary | ICD-10-CM | POA: Diagnosis not present

## 2018-03-21 DIAGNOSIS — Z5112 Encounter for antineoplastic immunotherapy: Secondary | ICD-10-CM

## 2018-03-21 DIAGNOSIS — Z923 Personal history of irradiation: Secondary | ICD-10-CM | POA: Insufficient documentation

## 2018-03-21 DIAGNOSIS — Z5111 Encounter for antineoplastic chemotherapy: Secondary | ICD-10-CM | POA: Insufficient documentation

## 2018-03-21 DIAGNOSIS — C778 Secondary and unspecified malignant neoplasm of lymph nodes of multiple regions: Secondary | ICD-10-CM | POA: Diagnosis not present

## 2018-03-21 DIAGNOSIS — C3491 Malignant neoplasm of unspecified part of right bronchus or lung: Secondary | ICD-10-CM

## 2018-03-21 LAB — CMP (CANCER CENTER ONLY)
ALBUMIN: 3.6 g/dL (ref 3.5–5.0)
ALT: 32 U/L (ref 0–44)
AST: 24 U/L (ref 15–41)
Alkaline Phosphatase: 85 U/L (ref 38–126)
Anion gap: 10 (ref 5–15)
BILIRUBIN TOTAL: 0.4 mg/dL (ref 0.3–1.2)
BUN: 9 mg/dL (ref 6–20)
CHLORIDE: 105 mmol/L (ref 98–111)
CO2: 27 mmol/L (ref 22–32)
CREATININE: 0.93 mg/dL (ref 0.61–1.24)
Calcium: 9.9 mg/dL (ref 8.9–10.3)
GFR, Est AFR Am: >60 mL/min (ref >60)
GLUCOSE: 82 mg/dL (ref 70–99)
Potassium: 4.2 mmol/L (ref 3.5–5.1)
Sodium: 142 mmol/L (ref 135–145)
TOTAL PROTEIN: 7.3 g/dL (ref 6.5–8.1)

## 2018-03-21 LAB — TSH: TSH: 1.532 u[IU]/mL (ref 0.320–4.118)

## 2018-03-21 LAB — CBC WITH DIFFERENTIAL (CANCER CENTER ONLY)
Basophils Absolute: 0.1 10*3/uL (ref 0.0–0.1)
Basophils Relative: 1 %
EOS ABS: 0.1 10*3/uL (ref 0.0–0.5)
EOS PCT: 1 %
HCT: 36.1 % — ABNORMAL LOW (ref 38.4–49.9)
Hemoglobin: 12.2 g/dL — ABNORMAL LOW (ref 13.0–17.1)
LYMPHS ABS: 1.1 10*3/uL (ref 0.9–3.3)
LYMPHS PCT: 23 %
MCH: 30.8 pg (ref 27.2–33.4)
MCHC: 33.8 g/dL (ref 32.0–36.0)
MCV: 91.2 fL (ref 79.3–98.0)
MONO ABS: 0.6 10*3/uL (ref 0.1–0.9)
Monocytes Relative: 13 %
Neutro Abs: 3 10*3/uL (ref 1.5–6.5)
Neutrophils Relative %: 62 %
Platelet Count: 417 10*3/uL — ABNORMAL HIGH (ref 140–400)
RBC: 3.96 MIL/uL — ABNORMAL LOW (ref 4.20–5.82)
RDW: 14.7 % — AB (ref 11.0–14.6)
WBC Count: 4.8 10*3/uL (ref 4.0–10.3)

## 2018-03-21 MED ORDER — SODIUM CHLORIDE 0.9% FLUSH
10.0000 mL | INTRAVENOUS | Status: DC | PRN
  Filled 2018-03-21: qty 10

## 2018-03-21 MED ORDER — DEXAMETHASONE SODIUM PHOSPHATE 10 MG/ML IJ SOLN
10.0000 mg | Freq: Once | INTRAMUSCULAR | Status: AC
  Administered 2018-03-21: 10 mg via INTRAVENOUS

## 2018-03-21 MED ORDER — HEPARIN SOD (PORK) LOCK FLUSH 100 UNIT/ML IV SOLN
500.0000 [IU] | Freq: Once | INTRAVENOUS | Status: DC | PRN
  Filled 2018-03-21: qty 5

## 2018-03-21 MED ORDER — ONDANSETRON HCL 4 MG/2ML IJ SOLN
8.0000 mg | Freq: Once | INTRAMUSCULAR | Status: AC
  Administered 2018-03-21: 8 mg via INTRAVENOUS

## 2018-03-21 MED ORDER — SODIUM CHLORIDE 0.9 % IV SOLN
Freq: Once | INTRAVENOUS | Status: AC
  Administered 2018-03-21: 10:00:00 via INTRAVENOUS
  Filled 2018-03-21: qty 250

## 2018-03-21 MED ORDER — ONDANSETRON HCL 4 MG/2ML IJ SOLN
INTRAMUSCULAR | Status: AC
  Filled 2018-03-21: qty 4

## 2018-03-21 MED ORDER — SODIUM CHLORIDE 0.9 % IV SOLN
200.0000 mg | Freq: Once | INTRAVENOUS | Status: AC
  Administered 2018-03-21: 200 mg via INTRAVENOUS
  Filled 2018-03-21: qty 8

## 2018-03-21 MED ORDER — DEXAMETHASONE SODIUM PHOSPHATE 10 MG/ML IJ SOLN
INTRAMUSCULAR | Status: AC
  Filled 2018-03-21: qty 1

## 2018-03-21 MED ORDER — SODIUM CHLORIDE 0.9 % IV SOLN
510.0000 mg/m2 | Freq: Once | INTRAVENOUS | Status: AC
  Administered 2018-03-21: 1100 mg via INTRAVENOUS
  Filled 2018-03-21: qty 40

## 2018-03-21 NOTE — Assessment & Plan Note (Addendum)
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 The patient completed a course of palliative radiotherapy to the cervical spine as well as left hip under the care of Dr. Sondra Come.Marland Kitchen He is currently undergoing systemic chemotherapy with carboplatin, Alimta and Keytruda status post4cyclesand then beginning with cycle 5 he was started on maintenance Alimta with Keytruda. He is status post 7 cycles of his treatment. He continues to tolerate this treatment fairly well with no concerning complaints except for fatigue.  His liver enzymes have normalized today.  The patient was seen with [name].  Recommend for him to proceed with cycle #8 of his treatment today as scheduled.  He will return in 3 weeks for evaluation prior to cycle #9.  He will follow-up with radiation oncology next week for radiation to his liver lesion.  The patient was advised to call if he has any concerning symptoms in the interval. The patient voices understanding of current disease status and treatment options and is in agreement with the current care plan.  All questions were answered. The patient knows to call the clinic with any problems, questions or concerns. We can certainly see the patient much sooner if necessary.

## 2018-03-21 NOTE — Telephone Encounter (Signed)
Next 3 cycles already scheduled per 8/21 los.

## 2018-03-21 NOTE — Patient Instructions (Signed)
[facility] Discharge Instructions for Patients Receiving Chemotherapy  Today you received the following chemotherapy agents:  Keytruda and Alimta.  To help prevent nausea and vomiting after your treatment, we encourage you to take your nausea medication as directed.   If you develop nausea and vomiting that is not controlled by your nausea medication, call the clinic.   BELOW ARE SYMPTOMS THAT SHOULD BE REPORTED IMMEDIATELY:  *FEVER GREATER THAN 100.5 F  *CHILLS WITH OR WITHOUT FEVER  NAUSEA AND VOMITING THAT IS NOT CONTROLLED WITH YOUR NAUSEA MEDICATION  *UNUSUAL SHORTNESS OF BREATH  *UNUSUAL BRUISING OR BLEEDING  TENDERNESS IN MOUTH AND THROAT WITH OR WITHOUT PRESENCE OF ULCERS  *URINARY PROBLEMS  *BOWEL PROBLEMS  UNUSUAL RASH Items with * indicate a potential emergency and should be followed up as soon as possible.  Feel free to call the clinic should you have any questions or concerns. The clinic phone number is [phone].  Please show the [place] at check-in to the Emergency Department and triage nurse.

## 2018-03-21 NOTE — Progress Notes (Signed)
[place] OFFICE PROGRESS NOTE  [name], MD [address] [place] [address]  DIAGNOSIS:Stage IVB(T1b, N2, M1c)non-small cell lung cancer, adenocarcinoma presented with right upper lobe lung nodule in addition to right hilar and mediastinal lymphadenopathy as well as metastatic liver and bone disease diagnosed in February 2019.  Biomarker Findings Microsatellite status - MS-Stable Tumor Mutational Burden - TMB-Low (4 Muts/Mb) Genomic Findings For a complete list of the genes assayed, please refer to the Appendix. EGFR exon 20 insertion ([id number]) RB1 loss exons 9-17 TP53 [id number] 7 Disease relevant genes with no reportable alterations: KRAS, ALK, BRAF, MET, RET, ERBB2, ROS1  PDL 1 expression5%  PRIOR THERAPY: Palliative radiotherapy to the metastatic bone lesions in the Anthony spine and left femur under the care of Dr. Sondra Come.  CURRENT THERAPY: Systemic chemotherapy with carboplatin for AUC of 5, Alimta 500 mg/M2 and Keytruda 200 mg IV every 3 weeks. First dose October 04, 2017.Starting with cycle #5, the patient continued on maintenance Alimta and Keytruda.He is status post 7 cycles of treatment.  INTERVAL HISTORY: Anthony [name] 47 y.o. male returns for a routine follow-up visit by himself.  The patient reports that he is feeling well overall with the exception of mild fatigue.  He denies fevers and chills.  Denies chest pain, shortness of breath, cough, hemoptysis.  Denies nausea, vomiting, constipation, diarrhea.  Denies recent weight loss or night sweats.  The patient is scheduled to have radiation to his liver lesion starting next week.  The patient is here for evaluation prior to 8 of his treatment.  MEDICAL HISTORY: Past Medical History:  Diagnosis Date  . Acid reflux   . History of radiation therapy 09/21/17-10/04/17   C4 spine 30 Gy in 10 fractions, left femur 30 Gy in 10 fractions  . Non-small cell lung cancer ([place])    . NSTEMI (non-ST elevated myocardial infarction) ([place]) 06/13/2017   [name] 06/14/2017  . Tailbone injury since 1993   "cracked"    ALLERGIES:  has No Known Allergies.  MEDICATIONS:  Current Outpatient Medications  Medication Sig Dispense Refill  . acetaminophen (TYLENOL) 500 MG tablet Take 500-1,000 mg by mouth daily as needed for moderate pain or headache.    Marland Kitchen aspirin EC 81 MG EC tablet Take 1 tablet (81 mg total) by mouth daily.    Marland Kitchen atorvastatin (LIPITOR) 40 MG tablet Take 1 tablet (40 mg total) by mouth daily. 90 tablet 3  . cetirizine (ZYRTEC) 10 MG tablet Take 10 mg by mouth daily as needed for allergies.     . chlorproMAZINE (THORAZINE) 25 MG tablet Take 1 tablet (25 mg total) by mouth 3 (three) times daily as needed. 30 tablet 0  . clopidogrel (PLAVIX) 75 MG tablet TAKE 1 TABLET BY MOUTH EVERY DAY 30 tablet 9  . esomeprazole (NEXIUM) 20 MG capsule Take 20 mg by mouth every other day.    . folic acid (FOLVITE) 1 MG tablet TAKE 1 TABLET BY MOUTH EVERY DAY 30 tablet 0  . lisinopril (PRINIVIL,ZESTRIL) 5 MG tablet TAKE 1 TABLET BY MOUTH EVERY DAY 90 tablet 3  . metoprolol tartrate (LOPRESSOR) 25 MG tablet TAKE 1/2 TABLET BY MOUTH 2 TIMES DAILY. (Patient taking differently: TAKE 1/2 TABLET  (12.5 mg) BY MOUTH 2 TIMES DAILY.) 30 tablet 9  . mirtazapine (REMERON) 30 MG tablet Take 1 tablet (30 mg total) by mouth at bedtime. 30 tablet 2  . prochlorperazine (COMPAZINE) 10 MG tablet Take 1 tablet (10 mg total) by  mouth every 6 (six) hours as needed for nausea or vomiting. 30 tablet 1   No current facility-administered medications for this visit.     SURGICAL HISTORY:  Past Surgical History:  Procedure Laterality Date  . CARDIAC CATHETERIZATION  06/14/2017  . CHOLECYSTECTOMY N/A 12/29/2017   Procedure: LAPAROSCOPIC CHOLECYSTECTOMY;  Surgeon: Ralene Ok, MD;  Location: [facility];  Service: General;  Laterality: N/A;  . CHOLECYSTECTOMY, LAPAROSCOPIC N/A 12/29/2017  . CORONARY ARTERY  BYPASS GRAFT N/A 06/19/2017   Procedure: CORONARY ARTERY BYPASS GRAFTING (CABG), OFF PUMP, times one using the left internal mammary artery to LAD;  Surgeon: Grace Isaac, MD;  Location: [facility];  Service: Open Heart Surgery;  Laterality: N/A;  . ESOPHAGOGASTRODUODENOSCOPY (EGD) WITH PROPOFOL N/A 11/30/2017   Procedure: ESOPHAGOGASTRODUODENOSCOPY (EGD) WITH PROPOFOL;  Surgeon: Gatha Mayer, MD;  Location: [facility];  Service: Endoscopy;  Laterality: N/A;  . LEFT HEART CATH AND CORONARY ANGIOGRAPHY N/A 06/14/2017   Procedure: LEFT HEART CATH AND CORONARY ANGIOGRAPHY;  Surgeon: Sherren Mocha, MD;  Location: [facility];  Service: Cardiovascular;  Laterality: N/A;  . TEE WITHOUT CARDIOVERSION N/A 06/19/2017   Procedure: TRANSESOPHAGEAL ECHOCARDIOGRAM (TEE);  Surgeon: Grace Isaac, MD;  Location: [facility];  Service: Open Heart Surgery;  Laterality: N/A;  . TONSILLECTOMY  ~ 1977    REVIEW OF SYSTEMS:   Review of Systems  Constitutional: Negative for appetite change, chills, fever and unexpected weight change.  Positive for mild fatigue. HENT:   Negative for mouth sores, nosebleeds, sore throat and trouble swallowing.   Eyes: Negative for eye problems and icterus.  Respiratory: Negative for cough, hemoptysis, shortness of breath and wheezing.   Cardiovascular: Negative for chest pain and leg swelling.  Gastrointestinal: Negative for abdominal pain, constipation, diarrhea, nausea and vomiting.  Genitourinary: Negative for bladder incontinence, difficulty urinating, dysuria, frequency and hematuria.   Musculoskeletal: Negative for back pain, gait problem, neck pain and neck stiffness.  Skin: Negative for itching and rash.  Neurological: Negative for dizziness, extremity weakness, gait problem, headaches, light-headedness and seizures.  Hematological: Negative for adenopathy. Does not bruise/bleed easily.  Psychiatric/Behavioral: Negative for confusion, depression and sleep  disturbance. The patient is not nervous/anxious.     PHYSICAL EXAMINATION:  Blood pressure 119/79, pulse 84, temperature 98.3 F (36.8 C), temperature source Oral, resp. rate 18, height _0  (1.778 m), weight 209 lb (94.8 kg), SpO2 100 %.  ECOG PERFORMANCE STATUS: 1 - Symptomatic but completely ambulatory  Physical Exam  Constitutional: Oriented to person, place, and time and well-developed, well-nourished, and in no distress. No distress.  HENT:  Head: Normocephalic and atraumatic.  Mouth/Throat: Oropharynx is clear and moist. No oropharyngeal exudate.  Eyes: Conjunctivae are normal. Right eye exhibits no discharge. Left eye exhibits no discharge. No scleral icterus.  Neck: Normal range of motion. Neck supple.  Cardiovascular: Normal rate, regular rhythm, normal heart sounds and intact distal pulses.   Pulmonary/Chest: Effort normal and breath sounds normal. No respiratory distress. No wheezes. No rales.  Abdominal: Soft. Bowel sounds are normal. Exhibits no distension and no mass. There is no tenderness.  Musculoskeletal: Normal range of motion. Exhibits no edema.  Lymphadenopathy:    No Anthony adenopathy.  Neurological: Alert and oriented to person, place, and time. Exhibits normal muscle tone. Gait normal. Coordination normal.  Skin: Skin is warm and dry. No rash noted. Not diaphoretic. No erythema. No pallor.  Psychiatric: Mood, memory and judgment normal.  Vitals reviewed.  LABORATORY DATA: Lab Results  Component  Value Date   WBC 4.8 03/21/2018   HGB 12.2 (L) 03/21/2018   HCT 36.1 (L) 03/21/2018   MCV 91.2 03/21/2018   PLT 417 (H) 03/21/2018      Chemistry      Component Value Date/Time   NA 142 03/21/2018 0908   NA 141 07/17/2017 1006   K 4.2 03/21/2018 0908   CL 105 03/21/2018 0908   CO2 27 03/21/2018 0908   BUN 9 03/21/2018 0908   BUN 12 07/17/2017 1006   CREATININE 0.93 03/21/2018 0908      Component Value Date/Time   CALCIUM 9.9 03/21/2018 0908    ALKPHOS 85 03/21/2018 0908   AST 24 03/21/2018 0908   ALT 32 03/21/2018 0908   BILITOT 0.4 03/21/2018 0908       RADIOGRAPHIC STUDIES:  Ct Chest W Contrast  Result Date: 02/21/2018 CLINICAL DATA:  Lung cancer. Elevated liver enzymes, decreased white blood cell count. Radiation therapy to left hip and thigh. Ongoing chemotherapy. EXAM: CT CHEST, ABDOMEN, AND PELVIS WITH CONTRAST TECHNIQUE: Multidetector CT imaging of the chest, abdomen and pelvis was performed following the standard protocol during bolus administration of intravenous contrast. CONTRAST:  152m ISOVUE-300 IOPAMIDOL (ISOVUE-300) INJECTION 61% COMPARISON:  11/28/2017 and PET 08/29/2017.  MR abdomen 09/04/2017. FINDINGS: CT CHEST FINDINGS Cardiovascular: Vascular structures are unremarkable. Heart size normal. No pericardial effusion. Mediastinum/Nodes: No pathologically enlarged mediastinal, hilar or axillary lymph nodes. Esophagus is grossly unremarkable. Lungs/Pleura: Ill-defined peribronchovascular nodularity and ground-glass in the right upper and right lower lobes are new from 11/28/2017. Nodular soft tissue in the anterior segment right upper lobe measures 0.8 x 1.4 cm, with surrounding architectural distortion and ground-glass, stable. Lungs are otherwise clear. No pleural fluid. Airway is unremarkable. Musculoskeletal: Degenerative changes in the spine. No worrisome lytic or sclerotic lesions. CT ABDOMEN PELVIS FINDINGS Hepatobiliary: A 1.5 cm intermediate density lesion in segment 4 of the liver is grossly stable from 11/28/2017. A slightly exophytic hypoattenuating lesion along the posterior dome of the liver measures 2.5 x 2.9 cm and is likely new from 11/28/2017 and definitely new from 09/04/2017. 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 Cholecystectomy. No biliary ductal dilatation. Pancreas: Negative. Spleen: Negative.  Adrenals/Urinary Tract: Adrenal glands and kidneys are unremarkable. Ureters are decompressed. Bladder grossly unremarkable. Stomach/Bowel: Stomach, small bowel, appendix and colon are unremarkable. Vascular/Lymphatic: Vascular structures are unremarkable. No pathologically enlarged lymph nodes. Reproductive: Prostate is visualized. Other: No free fluid.  Mesenteries and peritoneum are unremarkable. Musculoskeletal: A lucent lesion in the right L4 superior facet is unchanged from 08/29/2017. No worrisome lytic or sclerotic lesions. IMPRESSION: 1. New right hepatic lobe mass is most indicative a metastasis. Segment 4 left hepatic lobe lesion is grossly stable. 2. Nodular primary lesion with surrounding architectural distortion in the right upper lobe, stable. 3. Ill-defined peribronchovascular nodularity and ground-glass in the right upper and right lower lobes, new from 11/28/2017 and likely indicative of an infectious bronchiolitis. Electronically Signed   By: MLorin PicketM.D.   On: 02/21/2018 15:04   Mr BJeri CosWZTContrast  Result Date: 02/21/2018 CLINICAL DATA:  Initial evaluation for stage IV lung cancer with osseous lesion. Evaluate for metastatic disease. Recent increase in headaches. EXAM: MRI HEAD WITHOUT AND WITH CONTRAST MRI Anthony SPINE WITHOUT AND WITH CONTRAST TECHNIQUE: Multiplanar, multiecho pulse sequences of the brain and surrounding structures, and Anthony spine, to include the craniocervical junction and cervicothoracic junction, were  obtained without and with intravenous contrast. CONTRAST:  39m MULTIHANCE GADOBENATE DIMEGLUMINE 529 MG/ML IV SOLN COMPARISON:  Prior brain MRI from 09/15/2017 and MRI of the Anthony spine from 09/13/2017. FINDINGS: MRI HEAD FINDINGS Brain: Cerebral volume stable, and within normal limits for age. Few scattered subcentimeter T2/FLAIR hyperintense foci noted within the periventricular, deep, and subcortical white matter both cerebral hemispheres,  nonspecific, but felt to be within normal limits for age, and relatively stable from previous. No evidence for acute infarct. No acute or chronic intracranial hemorrhage. No areas of chronic infarction. No mass lesion, midline shift or mass effect. No hydrocephalus. No extra-axial fluid collection. No abnormal enhancement. No evidence for intracranial metastatic disease. Pituitary gland suprasellar region normal. Midline structures intact and normal. Vascular: Major intracranial vascular flow voids well maintained and normal. Skull and upper Anthony spine: Craniocervical junction within normal limits. No focal marrow replacing lesion within the calvarium or skull base. Sinuses/Orbits: Globes and orbital soft tissues within normal limits. Mild mucosal thickening throughout the paranasal sinuses. No air-fluid level to suggest acute sinusitis. No mastoid effusion. Inner ear structures normal. Other: None. MRI Anthony SPINE FINDINGS Alignment: Vertebral bodies normally aligned with preservation of the normal Anthony lordosis. No listhesis. Vertebrae: Previously identified metastatic lesion involving the posterior aspect of the C4 vertebral body again seen. Since previous exam, lesion is decreased in size and more well-defined in nature, now measuring approximate 1 cm in size (series 18, image 6). Lesion continues to enhance, although previously seen surrounding marrow enhancement within the adjacent C4 vertebral body has resolved. No appreciable cortical destruction or extra osseous extension of tumor. Superimposed endplate Schmorl's node without pathologic fracture. No other focal osseous lesions identified. Previously noted marrow edema and enhancement at the T1 vertebral body not seen on today's exam, suggesting previous finding was likely degenerative in nature. No other abnormal marrow edema or enhancement. Cord: Signal intensity within the Anthony spinal cord is normal. Posterior Fossa, vertebral arteries,  paraspinal tissues: Craniocervical junction within normal limits. Paraspinous and prevertebral soft tissues unremarkable. Normal intravascular flow voids present within the vertebral arteries bilaterally. Disc levels: C2-C3: Unremarkable. C3-C4: Mild bilateral uncovertebral hypertrophy with borderline mild right C4 foraminal stenosis. Central canal and left neural foramina remain widely patent. C4-C5: Mild disc bulge with uncovertebral spurring. Borderline mild bilateral foraminal narrowing without spinal stenosis. C5-C6: Minimal disc bulge with uncovertebral spurring. No canal or foraminal stenosis. C6-C7: Shallow central disc protrusion indents the ventral thecal sac. Mild bilateral uncovertebral hypertrophy. Borderline mild spinal stenosis with left C7 foraminal narrowing. C7-T1: Central disc protrusion indents the ventral thecal sac. Borderline mild spinal stenosis. Foramina remain patent. Visualized upper thoracic spine within normal limits. IMPRESSION: MRI HEAD IMPRESSION: Stable normal brain MRI for age. No intracranial metastatic disease or other acute intracranial abnormality. MRI Anthony SPINE IMPRESSION: 1. Interval decrease in size of C4 vertebral body metastatic lesion, compatible with interval response to therapy. No extra osseous tumor or other complication identified. 2. No other new metastatic lesions within the Anthony spine. Previously noted small focus of enhancement at the upper aspect of the T1 vertebral body not seen on today's exam, suggesting this was most likely degenerative in nature. 3. 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 Electronically Signed   By: BJeannine BogaM.D.   On: 02/21/2018 17:18   Mr Anthony [name] Contrast  Result Date: 02/21/2018 CLINICAL DATA:  Initial evaluation for stage IV lung cancer with osseous lesion. Evaluate for  metastatic disease. Recent increase in headaches. EXAM: MRI HEAD  WITHOUT AND WITH CONTRAST MRI Anthony SPINE WITHOUT AND WITH CONTRAST TECHNIQUE: Multiplanar, multiecho pulse sequences of the brain and surrounding structures, and Anthony spine, to include the craniocervical junction and cervicothoracic junction, were obtained without and with intravenous contrast. CONTRAST:  10m MULTIHANCE GADOBENATE DIMEGLUMINE 529 MG/ML IV SOLN COMPARISON:  Prior brain MRI from 09/15/2017 and MRI of the Anthony spine from 09/13/2017. FINDINGS: MRI HEAD FINDINGS Brain: Cerebral volume stable, and within normal limits for age. Few scattered subcentimeter T2/FLAIR hyperintense foci noted within the periventricular, deep, and subcortical white matter both cerebral hemispheres, nonspecific, but felt to be within normal limits for age, and relatively stable from previous. No evidence for acute infarct. No acute or chronic intracranial hemorrhage. No areas of chronic infarction. No mass lesion, midline shift or mass effect. No hydrocephalus. No extra-axial fluid collection. No abnormal enhancement. No evidence for intracranial metastatic disease. Pituitary gland suprasellar region normal. Midline structures intact and normal. Vascular: Major intracranial vascular flow voids well maintained and normal. Skull and upper Anthony spine: Craniocervical junction within normal limits. No focal marrow replacing lesion within the calvarium or skull base. Sinuses/Orbits: Globes and orbital soft tissues within normal limits. Mild mucosal thickening throughout the paranasal sinuses. No air-fluid level to suggest acute sinusitis. No mastoid effusion. Inner ear structures normal. Other: None. MRI Anthony SPINE FINDINGS Alignment: Vertebral bodies normally aligned with preservation of the normal Anthony lordosis. No listhesis. Vertebrae: Previously identified metastatic lesion involving the posterior aspect of the C4 vertebral body again seen. Since previous exam, lesion is decreased in size and more  well-defined in nature, now measuring approximate 1 cm in size (series 18, image 6). Lesion continues to enhance, although previously seen surrounding marrow enhancement within the adjacent C4 vertebral body has resolved. No appreciable cortical destruction or extra osseous extension of tumor. Superimposed endplate Schmorl's node without pathologic fracture. No other focal osseous lesions identified. Previously noted marrow edema and enhancement at the T1 vertebral body not seen on today's exam, suggesting previous finding was likely degenerative in nature. No other abnormal marrow edema or enhancement. Cord: Signal intensity within the Anthony spinal cord is normal. Posterior Fossa, vertebral arteries, paraspinal tissues: Craniocervical junction within normal limits. Paraspinous and prevertebral soft tissues unremarkable. Normal intravascular flow voids present within the vertebral arteries bilaterally. Disc levels: C2-C3: Unremarkable. C3-C4: Mild bilateral uncovertebral hypertrophy with borderline mild right C4 foraminal stenosis. Central canal and left neural foramina remain widely patent. C4-C5: Mild disc bulge with uncovertebral spurring. Borderline mild bilateral foraminal narrowing without spinal stenosis. C5-C6: Minimal disc bulge with uncovertebral spurring. No canal or foraminal stenosis. C6-C7: Shallow central disc protrusion indents the ventral thecal sac. Mild bilateral uncovertebral hypertrophy. Borderline mild spinal stenosis with left C7 foraminal narrowing. C7-T1: Central disc protrusion indents the ventral thecal sac. Borderline mild spinal stenosis. Foramina remain patent. Visualized upper thoracic spine within normal limits. IMPRESSION: MRI HEAD IMPRESSION: Stable normal brain MRI for age. No intracranial metastatic disease or other acute intracranial abnormality. MRI Anthony SPINE IMPRESSION: 1. Interval decrease in size of C4 vertebral body metastatic lesion, compatible with interval  response to therapy. No extra osseous tumor or other complication identified. 2. No other new metastatic lesions within the Anthony spine. Previously noted small focus of enhancement at the upper aspect of the T1 vertebral body not seen on today's exam, suggesting this was most likely degenerative in nature. 3. Mild multilevel Anthony spondylolysis with small disc protrusions at C6-7 and C7-T1,  stable from previous. Electronically Signed   By: Jeannine Boga M.D.   On: 02/21/2018 17:18   Ct Abdomen Pelvis W Contrast  Result Date: 02/21/2018 CLINICAL DATA:  Lung cancer. Elevated liver enzymes, decreased white blood cell count. Radiation therapy to left hip and thigh. Ongoing chemotherapy. EXAM: CT CHEST, ABDOMEN, AND PELVIS WITH CONTRAST TECHNIQUE: Multidetector CT imaging of the chest, abdomen and pelvis was performed following the standard protocol during bolus administration of intravenous contrast. CONTRAST:  167m ISOVUE-300 IOPAMIDOL (ISOVUE-300) INJECTION 61% COMPARISON:  11/28/2017 and PET 08/29/2017.  MR abdomen 09/04/2017. FINDINGS: CT CHEST FINDINGS Cardiovascular: Vascular structures are unremarkable. Heart size normal. No pericardial effusion. Mediastinum/Nodes: No pathologically enlarged mediastinal, hilar or axillary lymph nodes. Esophagus is grossly unremarkable. Lungs/Pleura: Ill-defined peribronchovascular nodularity and ground-glass in the right upper and right lower lobes are new from 11/28/2017. Nodular soft tissue in the anterior segment right upper lobe measures 0.8 x 1.4 cm, with surrounding architectural distortion and ground-glass, stable. Lungs are otherwise clear. No pleural fluid. Airway is unremarkable. Musculoskeletal: Degenerative changes in the spine. No worrisome lytic or sclerotic lesions. CT ABDOMEN PELVIS FINDINGS Hepatobiliary: A 1.5 cm intermediate density lesion in segment 4 of the liver is grossly stable from 11/28/2017. A slightly exophytic hypoattenuating lesion  along the posterior dome of the liver measures 2.5 x 2.9 cm and is likely new from 11/28/2017 and definitely new from 09/04/2017. 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 Cholecystectomy. No biliary ductal dilatation. Pancreas: Negative. Spleen: Negative. Adrenals/Urinary Tract: Adrenal glands and kidneys are unremarkable. Ureters are decompressed. Bladder grossly unremarkable. Stomach/Bowel: Stomach, small bowel, appendix and colon are unremarkable. Vascular/Lymphatic: Vascular structures are unremarkable. No pathologically enlarged lymph nodes. Reproductive: Prostate is visualized. Other: No free fluid.  Mesenteries and peritoneum are unremarkable. Musculoskeletal: A lucent lesion in the right L4 superior facet is unchanged from 08/29/2017. No worrisome lytic or sclerotic lesions. IMPRESSION: 1. New right hepatic lobe mass is most indicative a metastasis. Segment 4 left hepatic lobe lesion is grossly stable. 2. Nodular primary lesion with surrounding architectural distortion in the right upper lobe, stable. 3. Ill-defined peribronchovascular nodularity and ground-glass in the right upper and right lower lobes, new from 11/28/2017 and likely indicative of an infectious bronchiolitis. Electronically Signed   By: MLorin PicketM.D.   On: 02/21/2018 15:04     ASSESSMENT/PLAN:  Stage 4 lung cancer, right [place] This is a very pleasant 47year old white male with stage IV non-small cell lung cancer, adenocarcinoma with resistant EGFR mutation in exon 20 and PDL 1 expression of 5%. The patient completed a course of palliative radiotherapy to the Anthony spine as well as left hip under the care of Dr. KSondra Come.Marland KitchenHe is currently undergoing systemic chemotherapy with carboplatin, Alimta and Keytruda status post4cyclesand then beginning with cycle 5 he was started on maintenance Alimta with Keytruda. He is status  post 7 cycles of his treatment. He continues to tolerate this treatment fairly well with no concerning complaints except for fatigue.  His liver enzymes have normalized today.  The patient was seen with Dr. MEarlie Server  Recommend for him to proceed with cycle #8 of his treatment today as scheduled.  He will return in 3 weeks for evaluation prior to cycle #9.  He will follow-up with radiation oncology next week for radiation to his liver lesion.  The patient was advised to call if he has any concerning symptoms in  the interval. The patient voices understanding of current disease status and treatment options and is in agreement with the current care plan.  All questions were answered. The patient knows to call the clinic with any problems, questions or concerns. We can certainly see the patient much sooner if necessary.   No orders of the defined types were placed in this encounter.    Mikey Bussing, DNP, AGPCNP-BC, AOCNP 03/21/18   ADDENDUM: Hematology/Oncology Attending: The face-to-face encounter with the patient today.  I recommended his care plan.  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  The patient completed 4 cycles of systemic chemotherapy with carboplatin, Alimta and Ketruda (pembrolizumab) and currently undergoing maintenance treatment with Keytruda and Alimta.  He status post 3 cycles of this treatment. The patient is tolerating the treatment well. He was seen by Dr. Sondra Come for consideration of radiotherapy to the progressive liver lesion and he is scheduled to have this procedure done next week. I recommended for the patient to proceed with cycle #4 of his treatment with Alimta and Ketruda (pembrolizumab) as scheduled today. I will see him back for follow-up visit in 3 weeks for evaluation before the next cycle of his treatment. The  patient was advised to call immediately if he has any concerning symptoms in the interval.  Disclaimer: This note was dictated with voice recognition software. Similar sounding words can inadvertently be transcribed and may be missed upon review. Eilleen Kempf, MD 03/21/18

## 2018-03-22 DIAGNOSIS — C787 Secondary malignant neoplasm of liver and intrahepatic bile duct: Secondary | ICD-10-CM | POA: Diagnosis not present

## 2018-03-22 NOTE — Progress Notes (Signed)
[name]  Clinical Social Work met with patient in [place] office after patient's infusion treatment.  Mr. [name] shared recent concerns regarding his employer.  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  CSW sent patient information on [employer] which provides education and free legal guidance. [email or web address]   [name], LCSW  Clinical Social Worker [facility]

## 2018-03-26 ENCOUNTER — Ambulatory Visit
Admission: RE | Admit: 2018-03-26 | Discharge: 2018-03-26 | Disposition: A | Discharge status: Home / Self Care | Payer: BLUE CROSS/BLUE SHIELD | Source: Ambulatory Visit | Attending: Radiation Oncology | Admitting: Radiation Oncology

## 2018-03-26 DIAGNOSIS — C3491 Malignant neoplasm of unspecified part of right bronchus or lung: Secondary | ICD-10-CM

## 2018-03-26 DIAGNOSIS — C787 Secondary malignant neoplasm of liver and intrahepatic bile duct: Secondary | ICD-10-CM | POA: Diagnosis not present

## 2018-03-26 NOTE — Progress Notes (Signed)
  Radiation Oncology         [phone] ________________________________  Name: Anthony [name] MRN: [id number]  Date: 03/26/2018  DOB: 14-Aug-1970  Stereotactic Body Radiotherapy Treatment Procedure Note  NARRATIVE:  Anthony [name] was brought to the stereotactic radiation treatment machine and placed supine on the CT couch. The patient was set up for stereotactic body radiotherapy on the body fix pillow.  3D TREATMENT PLANNING AND DOSIMETRY:  The patient's radiation plan was reviewed and approved prior to starting treatment.  It showed 3-dimensional radiation distributions overlaid onto the planning CT.  The [facility] for the target structures as well as the organs at risk were reviewed. The documentation of this is filed in the radiation oncology EMR.  SIMULATION VERIFICATION:  The patient underwent CT imaging on the treatment unit.  These were carefully aligned to document that the ablative radiation dose would cover the target volume and maximally spare the nearby organs at risk according to the planned distribution.  SPECIAL TREATMENT PROCEDURE: Anthony [name] received high dose ablative stereotactic body radiotherapy to the planned target volume without unforeseen complications. Treatment was delivered uneventfully. The high doses associated with stereotactic body radiotherapy and the significant potential risks require careful treatment set up and patient monitoring constituting a special treatment procedure   STEREOTACTIC TREATMENT MANAGEMENT:  Following delivery, the patient was evaluated clinically. The patient tolerated treatment without significant acute effects, and was discharged to home in stable condition.    PLAN: Continue treatment as planned.  ________________________________  Blair Promise, PhD, MD   This document serves as a record of services personally performed by Gery Pray, MD. It was created on his behalf by [facility], a trained medical scribe. The creation of this record is  based on the scribe's personal observations and the provider's statements to them. This document has been checked and approved by the attending provider.

## 2018-03-28 ENCOUNTER — Ambulatory Visit
Admission: RE | Admit: 2018-03-28 | Discharge: 2018-03-28 | Disposition: A | Discharge status: Home / Self Care | Payer: BLUE CROSS/BLUE SHIELD | Source: Ambulatory Visit | Attending: Radiation Oncology | Admitting: Radiation Oncology

## 2018-03-28 ENCOUNTER — Ambulatory Visit: Payer: BLUE CROSS/BLUE SHIELD | Admitting: Radiation Oncology

## 2018-03-28 DIAGNOSIS — C787 Secondary malignant neoplasm of liver and intrahepatic bile duct: Secondary | ICD-10-CM | POA: Diagnosis not present

## 2018-03-28 NOTE — Progress Notes (Signed)
  Radiation Oncology         [phone] ________________________________  Name: Anthony [name] MRN: [id number]  Date: 03/28/2018  DOB: Dec 29, 1970  Stereotactic Body Radiotherapy Treatment Procedure Note  NARRATIVE:  Anthony [name] was brought to the stereotactic radiation treatment machine and placed supine on the CT couch. The patient was set up for stereotactic body radiotherapy on the body fix pillow.  3D TREATMENT PLANNING AND DOSIMETRY:  The patient's radiation plan was reviewed and approved prior to starting treatment.  It showed 3-dimensional radiation distributions overlaid onto the planning CT.  The [facility] for the target structures as well as the organs at risk were reviewed. The documentation of this is filed in the radiation oncology EMR.  SIMULATION VERIFICATION:  The patient underwent CT imaging on the treatment unit.  These were carefully aligned to document that the ablative radiation dose would cover the target volume and maximally spare the nearby organs at risk according to the planned distribution.  SPECIAL TREATMENT PROCEDURE: Anthony [name] received high dose ablative stereotactic body radiotherapy to the planned target volume without unforeseen complications. Treatment was delivered uneventfully. The high doses associated with stereotactic body radiotherapy and the significant potential risks require careful treatment set up and patient monitoring constituting a special treatment procedure   STEREOTACTIC TREATMENT MANAGEMENT:  Following delivery, the patient was evaluated clinically. The patient tolerated treatment without significant acute effects, and was discharged to home in stable condition.    PLAN: Continue treatment as planned.  ________________________________  Blair Promise, PhD, MD   This document serves as a record of services personally performed by Gery Pray, MD. It was created on his behalf by [facility], a trained medical scribe. The creation of this record is  based on the scribe's personal observations and the provider's statements to them. This document has been checked and approved by the attending provider.

## 2018-03-29 ENCOUNTER — Ambulatory Visit: Payer: BLUE CROSS/BLUE SHIELD | Admitting: Radiation Oncology

## 2018-03-30 ENCOUNTER — Ambulatory Visit
Admission: RE | Admit: 2018-03-30 | Discharge: 2018-03-30 | Disposition: A | Discharge status: Home / Self Care | Payer: BLUE CROSS/BLUE SHIELD | Source: Ambulatory Visit | Attending: Radiation Oncology | Admitting: Radiation Oncology

## 2018-03-30 DIAGNOSIS — C787 Secondary malignant neoplasm of liver and intrahepatic bile duct: Secondary | ICD-10-CM | POA: Diagnosis not present

## 2018-04-03 ENCOUNTER — Ambulatory Visit
Admission: RE | Admit: 2018-04-03 | Discharge: 2018-04-03 | Disposition: A | Discharge status: Home / Self Care | Payer: BLUE CROSS/BLUE SHIELD | Source: Ambulatory Visit | Attending: Radiation Oncology | Admitting: Radiation Oncology

## 2018-04-03 DIAGNOSIS — C787 Secondary malignant neoplasm of liver and intrahepatic bile duct: Secondary | ICD-10-CM

## 2018-04-03 DIAGNOSIS — C3411 Malignant neoplasm of upper lobe, right bronchus or lung: Secondary | ICD-10-CM | POA: Diagnosis present

## 2018-04-03 DIAGNOSIS — Z510 Encounter for antineoplastic radiation therapy: Secondary | ICD-10-CM | POA: Insufficient documentation

## 2018-04-03 NOTE — Progress Notes (Signed)
  Radiation Oncology         [phone] ________________________________  Name: Anthony [name] MRN: [id number]  Date: 04/03/2018  DOB: 09/21/1970  Stereotactic Body Radiotherapy Treatment Procedure Note  NARRATIVE:  Anthony [name] was brought to the stereotactic radiation treatment machine and placed supine on the CT couch. The patient was set up for stereotactic body radiotherapy on the body fix pillow.  3D TREATMENT PLANNING AND DOSIMETRY:  The patient's radiation plan was reviewed and approved prior to starting treatment.  It showed 3-dimensional radiation distributions overlaid onto the planning CT.  The [facility] for the target structures as well as the organs at risk were reviewed. The documentation of this is filed in the radiation oncology EMR.  SIMULATION VERIFICATION:  The patient underwent CT imaging on the treatment unit.  These were carefully aligned to document that the ablative radiation dose would cover the target volume and maximally spare the nearby organs at risk according to the planned distribution.  SPECIAL TREATMENT PROCEDURE: Anthony [name] received high dose ablative stereotactic body radiotherapy to the planned target volume without unforeseen complications. Treatment was delivered uneventfully. The high doses associated with stereotactic body radiotherapy and the significant potential risks require careful treatment set up and patient monitoring constituting a special treatment procedure   STEREOTACTIC TREATMENT MANAGEMENT:  Following delivery, the patient was evaluated clinically. The patient tolerated treatment without significant acute effects, and was discharged to home in stable condition.    PLAN: Continue treatment as planned.  ________________________________  Blair Promise, PhD, MD

## 2018-04-04 ENCOUNTER — Ambulatory Visit: Payer: BLUE CROSS/BLUE SHIELD | Admitting: Radiation Oncology

## 2018-04-05 ENCOUNTER — Ambulatory Visit
Admission: RE | Admit: 2018-04-05 | Discharge: 2018-04-05 | Disposition: A | Discharge status: Home / Self Care | Payer: BLUE CROSS/BLUE SHIELD | Source: Ambulatory Visit | Attending: Radiation Oncology | Admitting: Radiation Oncology

## 2018-04-05 DIAGNOSIS — C3411 Malignant neoplasm of upper lobe, right bronchus or lung: Secondary | ICD-10-CM | POA: Diagnosis not present

## 2018-04-05 DIAGNOSIS — C3491 Malignant neoplasm of unspecified part of right bronchus or lung: Secondary | ICD-10-CM

## 2018-04-05 NOTE — Progress Notes (Signed)
  Radiation Oncology         [phone] ________________________________  Name: Anthony [name] MRN: [id number]  Date: 04/05/2018  DOB: 1970/09/02  Stereotactic Body Radiotherapy Treatment Procedure Note  NARRATIVE:  Anthony [name] was brought to the stereotactic radiation treatment machine and placed supine on the CT couch. The patient was set up for stereotactic body radiotherapy on the body fix pillow.  3D TREATMENT PLANNING AND DOSIMETRY:  The patient's radiation plan was reviewed and approved prior to starting treatment.  It showed 3-dimensional radiation distributions overlaid onto the planning CT.  The [facility] for the target structures as well as the organs at risk were reviewed. The documentation of this is filed in the radiation oncology EMR.  SIMULATION VERIFICATION:  The patient underwent CT imaging on the treatment unit.  These were carefully aligned to document that the ablative radiation dose would cover the target volume and maximally spare the nearby organs at risk according to the planned distribution.  SPECIAL TREATMENT PROCEDURE: Anthony [name] received high dose ablative stereotactic body radiotherapy to the planned target volume without unforeseen complications. Treatment was delivered uneventfully. The high doses associated with stereotactic body radiotherapy and the significant potential risks require careful treatment set up and patient monitoring constituting a special treatment procedure   STEREOTACTIC TREATMENT MANAGEMENT:  Following delivery, the patient was evaluated clinically. The patient tolerated treatment without significant acute effects, and was discharged to home in stable condition.    PLAN: Continue treatment as planned.  ________________________________  Blair Promise, PhD, MD   This document serves as a record of services personally performed by Gery Pray, MD. It was created on his behalf by Wilburn Mylar, a trained medical scribe. The creation of this record  is based on the scribe's personal observations and the provider's statements to them. This document has been checked and approved by the attending provider.

## 2018-04-11 ENCOUNTER — Inpatient Hospital Stay: Payer: BLUE CROSS/BLUE SHIELD | Attending: Internal Medicine

## 2018-04-11 ENCOUNTER — Inpatient Hospital Stay (HOSPITAL_BASED_OUTPATIENT_CLINIC_OR_DEPARTMENT_OTHER): Payer: BLUE CROSS/BLUE SHIELD | Admitting: Internal Medicine

## 2018-04-11 ENCOUNTER — Inpatient Hospital Stay: Payer: BLUE CROSS/BLUE SHIELD

## 2018-04-11 ENCOUNTER — Other Ambulatory Visit: Payer: Self-pay

## 2018-04-11 ENCOUNTER — Encounter: Payer: Self-pay | Admitting: Internal Medicine

## 2018-04-11 ENCOUNTER — Telehealth: Payer: Self-pay | Admitting: Internal Medicine

## 2018-04-11 VITALS — BP 122/81 | HR 86 | Temp 98.5°F | Resp 18 | Ht 70.0 in | Wt 211.0 lb

## 2018-04-11 DIAGNOSIS — C7951 Secondary malignant neoplasm of bone: Secondary | ICD-10-CM | POA: Insufficient documentation

## 2018-04-11 DIAGNOSIS — I252 Old myocardial infarction: Secondary | ICD-10-CM | POA: Diagnosis not present

## 2018-04-11 DIAGNOSIS — Z5111 Encounter for antineoplastic chemotherapy: Secondary | ICD-10-CM | POA: Insufficient documentation

## 2018-04-11 DIAGNOSIS — C787 Secondary malignant neoplasm of liver and intrahepatic bile duct: Secondary | ICD-10-CM

## 2018-04-11 DIAGNOSIS — C3411 Malignant neoplasm of upper lobe, right bronchus or lung: Secondary | ICD-10-CM | POA: Insufficient documentation

## 2018-04-11 DIAGNOSIS — Z923 Personal history of irradiation: Secondary | ICD-10-CM

## 2018-04-11 DIAGNOSIS — Z5112 Encounter for antineoplastic immunotherapy: Secondary | ICD-10-CM | POA: Insufficient documentation

## 2018-04-11 DIAGNOSIS — C778 Secondary and unspecified malignant neoplasm of lymph nodes of multiple regions: Secondary | ICD-10-CM | POA: Diagnosis not present

## 2018-04-11 DIAGNOSIS — C3490 Malignant neoplasm of unspecified part of unspecified bronchus or lung: Secondary | ICD-10-CM

## 2018-04-11 DIAGNOSIS — C3491 Malignant neoplasm of unspecified part of right bronchus or lung: Secondary | ICD-10-CM

## 2018-04-11 DIAGNOSIS — Z7982 Long term (current) use of aspirin: Secondary | ICD-10-CM

## 2018-04-11 DIAGNOSIS — K219 Gastro-esophageal reflux disease without esophagitis: Secondary | ICD-10-CM | POA: Insufficient documentation

## 2018-04-11 DIAGNOSIS — Z79899 Other long term (current) drug therapy: Secondary | ICD-10-CM | POA: Diagnosis not present

## 2018-04-11 LAB — CMP (CANCER CENTER ONLY)
ALT: 30 U/L (ref 0–44)
AST: 27 U/L (ref 15–41)
Albumin: 3.5 g/dL (ref 3.5–5.0)
Alkaline Phosphatase: 70 U/L (ref 38–126)
Anion gap: 11 (ref 5–15)
BUN: 12 mg/dL (ref 6–20)
CHLORIDE: 108 mmol/L (ref 98–111)
CO2: 24 mmol/L (ref 22–32)
CREATININE: 0.87 mg/dL (ref 0.61–1.24)
Calcium: 9.8 mg/dL (ref 8.9–10.3)
GFR, Est AFR Am: >60 mL/min (ref >60)
GFR, Estimated: >60 mL/min (ref >60)
GLUCOSE: 89 mg/dL (ref 70–99)
Potassium: 4.2 mmol/L (ref 3.5–5.1)
SODIUM: 143 mmol/L (ref 135–145)
Total Bilirubin: 0.5 mg/dL (ref 0.3–1.2)
Total Protein: 6.9 g/dL (ref 6.5–8.1)

## 2018-04-11 LAB — CBC WITH DIFFERENTIAL (CANCER CENTER ONLY)
Basophils Absolute: 0 K/uL (ref 0.0–0.1)
Basophils Relative: 0 %
Eosinophils Absolute: 0 K/uL (ref 0.0–0.5)
Eosinophils Relative: 2 %
HCT: 34.9 % — ABNORMAL LOW (ref 38.4–49.9)
Hemoglobin: 12.1 g/dL — ABNORMAL LOW (ref 13.0–17.1)
Lymphocytes Relative: 32 %
Lymphs Abs: 0.8 K/uL — ABNORMAL LOW (ref 0.9–3.3)
MCH: 31.6 pg (ref 27.2–33.4)
MCHC: 34.6 g/dL (ref 32.0–36.0)
MCV: 91.2 fL (ref 79.3–98.0)
Monocytes Absolute: 0.3 K/uL (ref 0.1–0.9)
Monocytes Relative: 13 %
Neutro Abs: 1.4 K/uL — ABNORMAL LOW (ref 1.5–6.5)
Neutrophils Relative %: 53 %
Platelet Count: 308 K/uL (ref 140–400)
RBC: 3.83 MIL/uL — ABNORMAL LOW (ref 4.20–5.82)
RDW: 15.5 % — ABNORMAL HIGH (ref 11.0–14.6)
WBC Count: 2.6 K/uL — ABNORMAL LOW (ref 4.0–10.3)

## 2018-04-11 LAB — TSH: TSH: 2.358 u[IU]/mL (ref 0.320–4.118)

## 2018-04-11 MED ORDER — ONDANSETRON HCL 4 MG/2ML IJ SOLN
INTRAMUSCULAR | Status: AC
  Filled 2018-04-11: qty 4

## 2018-04-11 MED ORDER — SODIUM CHLORIDE 0.9 % IV SOLN
Freq: Once | INTRAVENOUS | Status: AC
  Administered 2018-04-11: 09:00:00 via INTRAVENOUS
  Filled 2018-04-11: qty 250

## 2018-04-11 MED ORDER — DEXAMETHASONE SODIUM PHOSPHATE 10 MG/ML IJ SOLN
10.0000 mg | Freq: Once | INTRAMUSCULAR | Status: AC
  Administered 2018-04-11: 10 mg via INTRAVENOUS

## 2018-04-11 MED ORDER — CYANOCOBALAMIN 1000 MCG/ML IJ SOLN
INTRAMUSCULAR | Status: AC
  Filled 2018-04-11: qty 1

## 2018-04-11 MED ORDER — DEXAMETHASONE SODIUM PHOSPHATE 10 MG/ML IJ SOLN
INTRAMUSCULAR | Status: AC
  Filled 2018-04-11: qty 1

## 2018-04-11 MED ORDER — SODIUM CHLORIDE 0.9 % IV SOLN
510.0000 mg/m2 | Freq: Once | INTRAVENOUS | Status: AC
  Administered 2018-04-11: 1100 mg via INTRAVENOUS
  Filled 2018-04-11: qty 4

## 2018-04-11 MED ORDER — ONDANSETRON HCL 4 MG/2ML IJ SOLN
8.0000 mg | Freq: Once | INTRAMUSCULAR | Status: AC
  Administered 2018-04-11: 8 mg via INTRAVENOUS

## 2018-04-11 MED ORDER — CYANOCOBALAMIN 1000 MCG/ML IJ SOLN
1000.0000 ug | Freq: Once | INTRAMUSCULAR | Status: AC
  Administered 2018-04-11: 1000 ug via INTRAMUSCULAR

## 2018-04-11 MED ORDER — SODIUM CHLORIDE 0.9 % IV SOLN
200.0000 mg | Freq: Once | INTRAVENOUS | Status: AC
  Administered 2018-04-11: 200 mg via INTRAVENOUS
  Filled 2018-04-11: qty 8

## 2018-04-11 NOTE — Progress Notes (Signed)
Per Dr. [name], [place] to tx with [place] 1.4.

## 2018-04-11 NOTE — Progress Notes (Signed)
[facility] [phone]   [phone]  OFFICE PROGRESS NOTE  [name], MD [address] [address]  DIAGNOSIS: Stage IVB(T1b, N2, M1c)non-small cell lung cancer, adenocarcinoma presented with right upper lobe lung nodule in addition to right hilar and mediastinal lymphadenopathy as well as metastatic liver and bone disease diagnosed in February 2019.  Biomarker Findings Microsatellite status - MS-Stable Tumor Mutational Burden - TMB-Low (4 Muts/Mb) Genomic Findings For a complete list of the genes assayed, please refer to the Appendix. EGFR exon 20 insertion ([id number]) RB1 loss exons 9-17 TP53 [id number] 7 Disease relevant genes with no reportable alterations: KRAS, ALK, BRAF, MET, RET, ERBB2, ROS1  PDL 1 expression 5%  PRIOR THERAPY:  1) Palliative radiotherapy to the metastatic bone lesions in the cervical spine and left femur under the care of Dr. Sondra Come. 2) Systemic chemotherapy with carboplatin for AUC of 5, Alimta 500 mg/M2 and Keytruda 200 mg IV every 3 weeks.  First dose October 04, 2017.  Status post 3 cycles. 3) palliative stereotactic radiotherapy to the progressive liver lesion.  CURRENT THERAPY: Maintenance treatment with Alimta 500 mg/M2 and Keytruda 200 mg IV every 3 weeks.  Status post 4 cycles.  INTERVAL HISTORY: Anthony [name] 47 y.o. male returns to the clinic today for follow-up visit.  The patient is feeling fine today with no concerning complaints except for fatigue and lack of sleep.  He denied having any chest pain, shortness of breath, cough or hemoptysis.  He denied having any fever or chills.  He has no nausea, vomiting, diarrhea or constipation.  He underwent palliative stereotactic radiotherapy to the progressive liver lesion under the care of Dr. Sondra Come for 5 fractions completed last week.  He tolerated the procedure well.  He is here today for evaluation before starting cycle #5 of  his maintenance treatment with Alimta and Ketruda (pembrolizumab).  MEDICAL HISTORY: Past Medical History:  Diagnosis Date  . Acid reflux   . History of radiation therapy 09/21/17-10/04/17   C4 spine 30 Gy in 10 fractions, left femur 30 Gy in 10 fractions  . Non-small cell lung cancer ([place])   . NSTEMI (non-ST elevated myocardial infarction) ([place]) 06/13/2017   [name] 06/14/2017  . Tailbone injury since 1993   "cracked"    ALLERGIES:  has No Known Allergies.  MEDICATIONS:  Current Outpatient Medications  Medication Sig Dispense Refill  . acetaminophen (TYLENOL) 500 MG tablet Take 500-1,000 mg by mouth daily as needed for moderate pain or headache.    Marland Kitchen aspirin EC 81 MG EC tablet Take 1 tablet (81 mg total) by mouth daily.    Marland Kitchen atorvastatin (LIPITOR) 40 MG tablet Take 1 tablet (40 mg total) by mouth daily. 90 tablet 3  . cetirizine (ZYRTEC) 10 MG tablet Take 10 mg by mouth daily as needed for allergies.     . chlorproMAZINE (THORAZINE) 25 MG tablet Take 1 tablet (25 mg total) by mouth 3 (three) times daily as needed. 30 tablet 0  . clopidogrel (PLAVIX) 75 MG tablet TAKE 1 TABLET BY MOUTH EVERY DAY 30 tablet 9  . esomeprazole (NEXIUM) 20 MG capsule Take 20 mg by mouth every other day.    . folic acid (FOLVITE) 1 MG tablet TAKE 1 TABLET BY MOUTH EVERY DAY 30 tablet 0  . lisinopril (PRINIVIL,ZESTRIL) 5 MG tablet TAKE 1 TABLET BY MOUTH EVERY DAY 90 tablet 3  . metoprolol tartrate (LOPRESSOR) 25 MG tablet TAKE 1/2 TABLET  BY MOUTH 2 TIMES DAILY. (Patient taking differently: TAKE 1/2 TABLET  (12.5 mg) BY MOUTH 2 TIMES DAILY.) 30 tablet 9  . mirtazapine (REMERON) 30 MG tablet Take 1 tablet (30 mg total) by mouth at bedtime. 30 tablet 2  . prochlorperazine (COMPAZINE) 10 MG tablet Take 1 tablet (10 mg total) by mouth every 6 (six) hours as needed for nausea or vomiting. 30 tablet 1   No current facility-administered medications for this visit.     SURGICAL HISTORY:  Past Surgical History:    Procedure Laterality Date  . CARDIAC CATHETERIZATION  06/14/2017  . CHOLECYSTECTOMY N/A 12/29/2017   Procedure: LAPAROSCOPIC CHOLECYSTECTOMY;  Surgeon: Ralene Ok, MD;  Location: [facility];  Service: General;  Laterality: N/A;  . CHOLECYSTECTOMY, LAPAROSCOPIC N/A 12/29/2017  . CORONARY ARTERY BYPASS GRAFT N/A 06/19/2017   Procedure: CORONARY ARTERY BYPASS GRAFTING (CABG), OFF PUMP, times one using the left internal mammary artery to LAD;  Surgeon: Grace Isaac, MD;  Location: [place];  Service: Open Heart Surgery;  Laterality: N/A;  . ESOPHAGOGASTRODUODENOSCOPY (EGD) WITH PROPOFOL N/A 11/30/2017   Procedure: ESOPHAGOGASTRODUODENOSCOPY (EGD) WITH PROPOFOL;  Surgeon: Gatha Mayer, MD;  Location: [facility];  Service: Endoscopy;  Laterality: N/A;  . LEFT HEART CATH AND CORONARY ANGIOGRAPHY N/A 06/14/2017   Procedure: LEFT HEART CATH AND CORONARY ANGIOGRAPHY;  Surgeon: Sherren Mocha, MD;  Location: [facility];  Service: Cardiovascular;  Laterality: N/A;  . TEE WITHOUT CARDIOVERSION N/A 06/19/2017   Procedure: TRANSESOPHAGEAL ECHOCARDIOGRAM (TEE);  Surgeon: Grace Isaac, MD;  Location: [place];  Service: Open Heart Surgery;  Laterality: N/A;  . TONSILLECTOMY  ~ 1977    REVIEW OF SYSTEMS:  A comprehensive review of systems was negative except for: Constitutional: positive for fatigue Behavioral/Psych: positive for sleep disturbance   PHYSICAL EXAMINATION: General appearance: alert, cooperative, fatigued and no distress Head: Normocephalic, without obvious abnormality, atraumatic Neck: no adenopathy, no JVD, supple, symmetrical, trachea midline and thyroid not enlarged, symmetric, no tenderness/mass/nodules Lymph nodes: Cervical, supraclavicular, and axillary nodes normal. Resp: clear to auscultation bilaterally Back: symmetric, no curvature. ROM normal. No CVA tenderness. Cardio: regular rate and rhythm, S1, S2 normal, no murmur, click, rub or gallop GI: soft, non-tender;  bowel sounds normal; no masses,  no organomegaly Extremities: extremities normal, atraumatic, no cyanosis or edema  ECOG PERFORMANCE STATUS: 1 - Symptomatic but completely ambulatory  Blood pressure 122/81, pulse 86, temperature 98.5 F (36.9 C), temperature source Oral, resp. rate 18, height '5\' 10"'$  (1.778 m), weight 211 lb (95.7 kg), SpO2 98 %.  LABORATORY DATA: Lab Results  Component Value Date   WBC 2.6 (L) 04/11/2018   HGB 12.1 (L) 04/11/2018   HCT 34.9 (L) 04/11/2018   MCV 91.2 04/11/2018   PLT 308 04/11/2018      Chemistry      Component Value Date/Time   NA 143 04/11/2018 0744   NA 141 07/17/2017 1006   K 4.2 04/11/2018 0744   CL 108 04/11/2018 0744   CO2 24 04/11/2018 0744   BUN 12 04/11/2018 0744   BUN 12 07/17/2017 1006   CREATININE 0.87 04/11/2018 0744      Component Value Date/Time   CALCIUM 9.8 04/11/2018 0744   ALKPHOS 70 04/11/2018 0744   AST 27 04/11/2018 0744   ALT 30 04/11/2018 0744   BILITOT 0.5 04/11/2018 0744       RADIOGRAPHIC STUDIES: No results found.  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 The patient completed a course of palliative radiotherapy to the cervical spine as well as left hip under the care of Dr. Sondra Come.Marland Kitchen He completed induction systemic chemotherapy with carboplatin, Alimta and Keytruda status post 4 cycles.  He has no evidence for disease progression after the induction phase of his treatment. The patient was started on maintenance treatment with Alimta and Ketruda (pembrolizumab) status post 4 cycles.  He has been tolerating this treatment well with no concerning complaints. He recently underwent stereotactic palliative radiotherapy to progressive liver lesion completed last week under the care of Dr. Sondra Come. I recommended for the patient to proceed with cycle #5 of his  treatment today as scheduled. I will see him back for follow-up visit in 3 weeks for evaluation after repeating CT scan of the chest, abdomen and pelvis for restaging of his disease. The patient was advised to call immediately if he has any concerning symptoms in the interval.  The patient voices understanding of current disease status and treatment options and is in agreement with the current care plan.  All questions were answered. The patient knows to call the clinic with any problems, questions or concerns. We can certainly see the patient much sooner if necessary.  Disclaimer: This note was dictated with voice recognition software. Similar sounding words can inadvertently be transcribed and may not be corrected upon review.

## 2018-04-11 NOTE — Telephone Encounter (Signed)
Appts already scheduled per 9/11 los.

## 2018-04-12 ENCOUNTER — Encounter: Payer: Self-pay | Admitting: Interventional Cardiology

## 2018-04-13 NOTE — Progress Notes (Signed)
Disability successfully faxed to [employer] at [phone]. Mailed copy to patient address on file.

## 2018-04-16 ENCOUNTER — Telehealth: Payer: Self-pay | Admitting: Medical Oncology

## 2018-04-16 ENCOUNTER — Telehealth: Payer: Self-pay | Admitting: *Deleted

## 2018-04-16 NOTE — Telephone Encounter (Signed)
Returned call. Left message to call back.

## 2018-04-16 NOTE — Telephone Encounter (Signed)
Voicemail received "requesting to speak with Dr. Worthy Flank nurse, Erasmo Downer.  If she could call me back at [phone]".   No further information provided.  Message forwarded to collaborative to assist with patient's request.

## 2018-04-19 ENCOUNTER — Encounter: Payer: Self-pay | Admitting: Radiation Oncology

## 2018-04-19 NOTE — Progress Notes (Signed)
  Radiation Oncology         [phone] ________________________________  Name: Anthony [name] MRN: [id number]  Date: 04/19/2018  DOB: Feb 19, 1971  End of Treatment Note  Diagnosis:  Stage IVB(T1b, N2, M1c)non-small cell right lung cancer with solitary liver metastasis  Indication for treatment:  Palliative       Radiation treatment dates:   03/26/18 - 04/05/18  Site/dose:   Liver, Right Lobe/ 60 Gy delivered in 5 fractions of 12 Gy  Beams/energy:   SBRT/SRT-VMAT/ 10X-FFF  Narrative: The patient tolerated radiation treatment relatively well. He denied pain, nausea or vomiting, diarrhea, and any skin irritation. He reported mild to moderate fatigue.    Plan: The patient has completed radiation treatment. The patient will return to radiation oncology clinic for routine followup in one month. I advised them to call or return sooner if they have any questions or concerns related to their recovery or treatment.  -----------------------------------  Blair Promise, PhD, MD  This document serves as a record of services personally performed by Gery Pray, MD. It was created on his behalf by Wilburn Mylar, a trained medical scribe. The creation of this record is based on the scribe's personal observations and the provider's statements to them. This document has been checked and approved by the attending provider.

## 2018-04-24 NOTE — Progress Notes (Signed)
Cardiology Office Note:    Date:  04/25/2018   ID:  Anthony [name], DOB 10/24/1970, MRN [id number]  PCP:  [name], MD  Cardiologist:  Sinclair Grooms, MD   Referring MD: [name],*   Chief Complaint  Patient presents with  . Coronary Artery Disease    History of Present Illness:    Anthony [name] is a 47 y.o. male with a hx of CABG, coronary bypass grafting performed in November 2018, metastatic non-small cell lung cancer treated with palliative radiation therapy of metastatic bone lesions and chemotherapy (carboplatin, Alimta, Keytruda), hyperlipidemia, CKD 3, and hypertension.  He is doing well.  He denies angina.  Since I last saw him he has had a cholecystectomy for an acute bout of choledocholithiasis.  He has metastatic non-small cell lung cancer.  He is on a chemotherapy regimen.  Has had mass to his liver femur and intra-thoracic.  Past Medical History:  Diagnosis Date  . Acid reflux   . History of radiation therapy 09/21/17-10/04/17   C4 spine 30 Gy in 10 fractions, left femur 30 Gy in 10 fractions  . Non-small cell lung cancer ([place])   . NSTEMI (non-ST elevated myocardial infarction) ([place]) 06/13/2017   [name] 06/14/2017  . Tailbone injury since 1993   "cracked"    Past Surgical History:  Procedure Laterality Date  . CARDIAC CATHETERIZATION  06/14/2017  . CHOLECYSTECTOMY N/A 12/29/2017   Procedure: LAPAROSCOPIC CHOLECYSTECTOMY;  Surgeon: Ralene Ok, MD;  Location: [facility];  Service: General;  Laterality: N/A;  . CHOLECYSTECTOMY, LAPAROSCOPIC N/A 12/29/2017  . CORONARY ARTERY BYPASS GRAFT N/A 06/19/2017   Procedure: CORONARY ARTERY BYPASS GRAFTING (CABG), OFF PUMP, times one using the left internal mammary artery to LAD;  Surgeon: Grace Isaac, MD;  Location: [facility];  Service: Open Heart Surgery;  Laterality: N/A;  . ESOPHAGOGASTRODUODENOSCOPY (EGD) WITH PROPOFOL N/A 11/30/2017   Procedure: ESOPHAGOGASTRODUODENOSCOPY (EGD) WITH PROPOFOL;   Surgeon: Gatha Mayer, MD;  Location: [facility];  Service: Endoscopy;  Laterality: N/A;  . LEFT HEART CATH AND CORONARY ANGIOGRAPHY N/A 06/14/2017   Procedure: LEFT HEART CATH AND CORONARY ANGIOGRAPHY;  Surgeon: Sherren Mocha, MD;  Location: [facility];  Service: Cardiovascular;  Laterality: N/A;  . TEE WITHOUT CARDIOVERSION N/A 06/19/2017   Procedure: TRANSESOPHAGEAL ECHOCARDIOGRAM (TEE);  Surgeon: Grace Isaac, MD;  Location: [facility];  Service: Open Heart Surgery;  Laterality: N/A;  . TONSILLECTOMY  ~ 1977    Current Medications: Current Meds  Medication Sig  . acetaminophen (TYLENOL) 500 MG tablet Take 500-1,000 mg by mouth daily as needed for moderate pain or headache.  Marland Kitchen aspirin EC 81 MG EC tablet Take 1 tablet (81 mg total) by mouth daily.  Marland Kitchen atorvastatin (LIPITOR) 40 MG tablet Take 1 tablet (40 mg total) by mouth daily.  . cetirizine (ZYRTEC) 10 MG tablet Take 10 mg by mouth daily as needed for allergies.   . chlorproMAZINE (THORAZINE) 25 MG tablet Take 1 tablet (25 mg total) by mouth 3 (three) times daily as needed.  Marland Kitchen esomeprazole (NEXIUM) 20 MG capsule Take 20 mg by mouth every other day.  . folic acid (FOLVITE) 1 MG tablet TAKE 1 TABLET BY MOUTH EVERY DAY  . lisinopril (PRINIVIL,ZESTRIL) 5 MG tablet TAKE 1 TABLET BY MOUTH EVERY DAY  . metoprolol tartrate (LOPRESSOR) 25 MG tablet Take 12.5 mg by mouth 2 (two) times daily.  . mirtazapine (REMERON) 30 MG tablet Take 1 tablet (30 mg total) by mouth at bedtime.  Marland Kitchen  prochlorperazine (COMPAZINE) 10 MG tablet Take 1 tablet (10 mg total) by mouth every 6 (six) hours as needed for nausea or vomiting.  . [DISCONTINUED] clopidogrel (PLAVIX) 75 MG tablet TAKE 1 TABLET BY MOUTH EVERY DAY     Allergies:   Patient has no known allergies.   Social History   Socioeconomic History  . Marital status: Divorced    Spouse name: Not on file  . Number of children: Not on file  . Years of education: Not on file  . Highest  education level: Not on file  Occupational History  . Occupation: Scientist, clinical (histocompatibility and immunogenetics): [employer]  . Financial resource strain: Not on file  . Food insecurity:    Worry: Not on file    Inability: Not on file  . Transportation needs:    Medical: Not on file    Non-medical: Not on file  Tobacco Use  . Smoking status: Never Smoker  . Smokeless tobacco: Former Systems developer    Types: Snuff  Substance and Sexual Activity  . Alcohol use: Yes    Comment: 06/16/2017 "used to drink alot; pretty much stopped~ 01/2015; will have a couple drinks/month now"  . Drug use: No    Comment: 06/14/2017 "nothing since 2000"  . Sexual activity: Yes  Lifestyle  . Physical activity:    Days per week: Not on file    Minutes per session: Not on file  . Stress: Not on file  Relationships  . Social connections:    Talks on phone: Not on file    Gets together: Not on file    Attends religious service: Not on file    Active member of club or organization: Not on file    Attends meetings of clubs or organizations: Not on file    Relationship status: Not on file  Other Topics Concern  . Not on file  Social History Narrative   Divorced   Tool Salesman   + EtOH   Never smoker     Family History: The patient's family history includes Alcohol abuse in his mother; Aneurysm in his maternal grandmother; Arrhythmia in his father; Heart disease in his father.  ROS:   Please see the history of present illness.    Poor appetite food does not taste good.  Some vision disturbance.  All other systems reviewed and are negative.  EKGs/Labs/Other Studies Reviewed:    The following studies were reviewed today: No new data  EKG:  EKG is not ordered today.    Recent Labs: 06/20/2017: Magnesium 2.0 04/11/2018: ALT 30; BUN 12; Creatinine 0.87; Hemoglobin 12.1; Platelet Count 308; Potassium 4.2; Sodium 143; TSH 2.358  Recent Lipid Panel    Component Value Date/Time   CHOL 127 02/02/2018 0825   TRIG 99  02/02/2018 0825   HDL 35 (L) 02/02/2018 0825   CHOLHDL 3.6 02/02/2018 0825   CHOLHDL 7.2 06/14/2017 0754   VLDL 32 06/14/2017 0754   LDLCALC 72 02/02/2018 0825    Physical Exam:    VS:  BP 116/78   Pulse 96   Ht 5\' 10"  (1.778 m)   Wt 209 lb (94.8 kg)   BMI 29.99 kg/m     Wt Readings from Last 3 Encounters:  04/25/18 209 lb (94.8 kg)  04/11/18 211 lb (95.7 kg)  03/21/18 209 lb (94.8 kg)     GEN:  Well nourished, well developed in no acute distress HEENT: Normal NECK: No JVD. LYMPHATICS: No lymphadenopathy CARDIAC: RRR, no  murmur, S4 gallop, no edema. VASCULAR: 2+ and symmetric radial bilateral pulses.  No bruits. RESPIRATORY:  Clear to auscultation without rales, wheezing or rhonchi  ABDOMEN: Soft, non-tender, non-distended, No pulsatile mass, MUSCULOSKELETAL: No deformity  SKIN: Warm and dry NEUROLOGIC:  Alert and oriented x 3 PSYCHIATRIC:  Normal affect   ASSESSMENT:    1. Coronary artery disease involving coronary bypass graft of native heart with angina pectoris ([place])   2. Bone metastasis ([place])   3. Encounter for antineoplastic immunotherapy   4. Encounter for antineoplastic chemotherapy   5. Hyperlipidemia with target LDL less than 70   6. NSTEMI (non-ST elevated myocardial infarction) ([place])   7. Stage III chronic kidney disease ([place])    PLAN:    In order of problems listed above:  1. Doing well.  Status post LIMA to LAD.  Circumflex was too small to graft.  Risk factor modification will include lipid-lowering, LDL less than 70.  Blood pressure 130/80 mmHg or less.  Aerobic physical activity to achieve 150 minutes/week.  Monitoring of blood sugar.  Discontinue Plavix. 2. Being treated by Dr. Lorna Few 3. No clinical evidence of LV dysfunction or ischemic episodes. 4. No clinical evidence of LV dysfunction or ischemic episodes. 5. As noted above.   Clinic follow-up in 1 year.  Discontinue Plavix.   Medication Adjustments/Labs and Tests  Ordered: Current medicines are reviewed at length with the patient today.  Concerns regarding medicines are outlined above.  No orders of the defined types were placed in this encounter.  No orders of the defined types were placed in this encounter.   There are no Patient Instructions on file for this visit.   Signed, Sinclair Grooms, MD  04/25/2018 9:13 AM    [place]

## 2018-04-25 ENCOUNTER — Ambulatory Visit (INDEPENDENT_AMBULATORY_CARE_PROVIDER_SITE_OTHER): Payer: BLUE CROSS/BLUE SHIELD | Admitting: Interventional Cardiology

## 2018-04-25 ENCOUNTER — Encounter: Payer: Self-pay | Admitting: Interventional Cardiology

## 2018-04-25 VITALS — BP 116/78 | HR 96 | Ht 70.0 in | Wt 209.0 lb

## 2018-04-25 DIAGNOSIS — N183 Chronic kidney disease, stage 3 unspecified: Secondary | ICD-10-CM

## 2018-04-25 DIAGNOSIS — Z5111 Encounter for antineoplastic chemotherapy: Secondary | ICD-10-CM

## 2018-04-25 DIAGNOSIS — Z5112 Encounter for antineoplastic immunotherapy: Secondary | ICD-10-CM

## 2018-04-25 DIAGNOSIS — I214 Non-ST elevation (NSTEMI) myocardial infarction: Secondary | ICD-10-CM

## 2018-04-25 DIAGNOSIS — C7951 Secondary malignant neoplasm of bone: Secondary | ICD-10-CM | POA: Diagnosis not present

## 2018-04-25 DIAGNOSIS — I25709 Atherosclerosis of coronary artery bypass graft(s), unspecified, with unspecified angina pectoris: Secondary | ICD-10-CM

## 2018-04-25 DIAGNOSIS — E785 Hyperlipidemia, unspecified: Secondary | ICD-10-CM

## 2018-04-25 NOTE — Patient Instructions (Signed)
Medication Instructions:  Your physician has recommended you make the following change in your medication:   Once you finish your supply of clopidogrel (plavix) you may stop taking it   Labwork: None ordered  Testing/Procedures: None ordered  Follow-Up: Your physician wants you to follow-up in: 1 year with [name]. You will receive a reminder letter in the mail two months in advance. If you don't receive a letter, please call our office to schedule the follow-up appointment.   Any Other Special Instructions Will Be Listed Below (If Applicable).     If you need a refill on your cardiac medications before your next appointment, please call your pharmacy.

## 2018-05-01 ENCOUNTER — Ambulatory Visit (HOSPITAL_COMMUNITY)
Admission: RE | Admit: 2018-05-01 | Discharge: 2018-05-01 | Disposition: A | Discharge status: Home / Self Care | Payer: Medicaid Other | Source: Ambulatory Visit | Attending: Internal Medicine | Admitting: Internal Medicine

## 2018-05-01 ENCOUNTER — Encounter (HOSPITAL_COMMUNITY): Payer: Self-pay

## 2018-05-01 ENCOUNTER — Other Ambulatory Visit: Payer: Self-pay | Admitting: Oncology

## 2018-05-01 DIAGNOSIS — R160 Hepatomegaly, not elsewhere classified: Secondary | ICD-10-CM | POA: Insufficient documentation

## 2018-05-01 DIAGNOSIS — K5730 Diverticulosis of large intestine without perforation or abscess without bleeding: Secondary | ICD-10-CM | POA: Insufficient documentation

## 2018-05-01 DIAGNOSIS — C3490 Malignant neoplasm of unspecified part of unspecified bronchus or lung: Secondary | ICD-10-CM

## 2018-05-01 DIAGNOSIS — C3491 Malignant neoplasm of unspecified part of right bronchus or lung: Secondary | ICD-10-CM

## 2018-05-01 MED ORDER — IOHEXOL 300 MG/ML  SOLN
100.0000 mL | Freq: Once | INTRAMUSCULAR | Status: AC | PRN
  Administered 2018-05-01: 100 mL via INTRAVENOUS

## 2018-05-01 MED ORDER — SODIUM CHLORIDE 0.9 % IJ SOLN
INTRAMUSCULAR | Status: AC
  Filled 2018-05-01: qty 50

## 2018-05-02 ENCOUNTER — Inpatient Hospital Stay: Payer: Medicaid Other

## 2018-05-02 ENCOUNTER — Encounter: Payer: Self-pay | Admitting: Oncology

## 2018-05-02 ENCOUNTER — Inpatient Hospital Stay: Payer: Medicaid Other | Attending: Internal Medicine

## 2018-05-02 ENCOUNTER — Inpatient Hospital Stay (HOSPITAL_BASED_OUTPATIENT_CLINIC_OR_DEPARTMENT_OTHER): Payer: Medicaid Other | Admitting: Oncology

## 2018-05-02 ENCOUNTER — Telehealth: Payer: Self-pay | Admitting: Oncology

## 2018-05-02 VITALS — BP 125/88 | HR 77 | Temp 98.8°F | Resp 17 | Ht 70.0 in | Wt 211.9 lb

## 2018-05-02 DIAGNOSIS — R110 Nausea: Secondary | ICD-10-CM | POA: Diagnosis not present

## 2018-05-02 DIAGNOSIS — K219 Gastro-esophageal reflux disease without esophagitis: Secondary | ICD-10-CM | POA: Diagnosis not present

## 2018-05-02 DIAGNOSIS — Z7982 Long term (current) use of aspirin: Secondary | ICD-10-CM | POA: Insufficient documentation

## 2018-05-02 DIAGNOSIS — K5730 Diverticulosis of large intestine without perforation or abscess without bleeding: Secondary | ICD-10-CM | POA: Diagnosis not present

## 2018-05-02 DIAGNOSIS — Z923 Personal history of irradiation: Secondary | ICD-10-CM | POA: Insufficient documentation

## 2018-05-02 DIAGNOSIS — Z5111 Encounter for antineoplastic chemotherapy: Secondary | ICD-10-CM | POA: Insufficient documentation

## 2018-05-02 DIAGNOSIS — C787 Secondary malignant neoplasm of liver and intrahepatic bile duct: Secondary | ICD-10-CM | POA: Diagnosis not present

## 2018-05-02 DIAGNOSIS — C3411 Malignant neoplasm of upper lobe, right bronchus or lung: Secondary | ICD-10-CM | POA: Diagnosis not present

## 2018-05-02 DIAGNOSIS — C778 Secondary and unspecified malignant neoplasm of lymph nodes of multiple regions: Secondary | ICD-10-CM

## 2018-05-02 DIAGNOSIS — Z79899 Other long term (current) drug therapy: Secondary | ICD-10-CM | POA: Diagnosis not present

## 2018-05-02 DIAGNOSIS — Z5112 Encounter for antineoplastic immunotherapy: Secondary | ICD-10-CM

## 2018-05-02 DIAGNOSIS — C7951 Secondary malignant neoplasm of bone: Secondary | ICD-10-CM | POA: Insufficient documentation

## 2018-05-02 DIAGNOSIS — C3491 Malignant neoplasm of unspecified part of right bronchus or lung: Secondary | ICD-10-CM

## 2018-05-02 DIAGNOSIS — I252 Old myocardial infarction: Secondary | ICD-10-CM | POA: Insufficient documentation

## 2018-05-02 DIAGNOSIS — K5900 Constipation, unspecified: Secondary | ICD-10-CM | POA: Diagnosis not present

## 2018-05-02 LAB — CBC WITH DIFFERENTIAL (CANCER CENTER ONLY)
Basophils Absolute: 0 10*3/uL (ref 0.0–0.1)
Basophils Relative: 1 %
EOS ABS: 0 10*3/uL (ref 0.0–0.5)
Eosinophils Relative: 2 %
HCT: 34.6 % — ABNORMAL LOW (ref 38.4–49.9)
HEMOGLOBIN: 11.9 g/dL — AB (ref 13.0–17.1)
LYMPHS ABS: 0.8 10*3/uL — AB (ref 0.9–3.3)
LYMPHS PCT: 23 %
MCH: 31.8 pg (ref 27.2–33.4)
MCHC: 34.4 g/dL (ref 32.0–36.0)
MCV: 92.3 fL (ref 79.3–98.0)
Monocytes Absolute: 0.4 10*3/uL (ref 0.1–0.9)
Monocytes Relative: 12 %
NEUTROS PCT: 62 %
Neutro Abs: 2 10*3/uL (ref 1.5–6.5)
PLATELETS: 288 10*3/uL (ref 140–400)
RBC: 3.74 MIL/uL — AB (ref 4.20–5.82)
RDW: 16.6 % — ABNORMAL HIGH (ref 11.0–14.6)
WBC: 3.3 10*3/uL — AB (ref 4.0–10.3)

## 2018-05-02 LAB — CMP (CANCER CENTER ONLY)
ALT: 53 U/L — AB (ref 0–44)
AST: 33 U/L (ref 15–41)
Albumin: 3.6 g/dL (ref 3.5–5.0)
Alkaline Phosphatase: 80 U/L (ref 38–126)
Anion gap: 10 (ref 5–15)
BUN: 10 mg/dL (ref 6–20)
CHLORIDE: 108 mmol/L (ref 98–111)
CO2: 25 mmol/L (ref 22–32)
CREATININE: 1.02 mg/dL (ref 0.61–1.24)
Calcium: 9.7 mg/dL (ref 8.9–10.3)
Glucose, Bld: 106 mg/dL — ABNORMAL HIGH (ref 70–99)
POTASSIUM: 4.1 mmol/L (ref 3.5–5.1)
Sodium: 143 mmol/L (ref 135–145)
Total Bilirubin: 0.5 mg/dL (ref 0.3–1.2)
Total Protein: 6.8 g/dL (ref 6.5–8.1)

## 2018-05-02 LAB — TSH: TSH: 1.554 u[IU]/mL (ref 0.320–4.118)

## 2018-05-02 MED ORDER — DEXAMETHASONE SODIUM PHOSPHATE 10 MG/ML IJ SOLN
10.0000 mg | Freq: Once | INTRAMUSCULAR | Status: AC
  Administered 2018-05-02: 10 mg via INTRAVENOUS

## 2018-05-02 MED ORDER — FOLIC ACID 1 MG PO TABS: 1.0000 mg | ORAL_TABLET | Freq: Every day | ORAL | 2 refills | Status: DC

## 2018-05-02 MED ORDER — SODIUM CHLORIDE 0.9 % IV SOLN
Freq: Once | INTRAVENOUS | Status: DC
  Filled 2018-05-02: qty 250

## 2018-05-02 MED ORDER — SODIUM CHLORIDE 0.9 % IV SOLN
511.0000 mg/m2 | Freq: Once | INTRAVENOUS | Status: AC
  Administered 2018-05-02: 1100 mg via INTRAVENOUS
  Filled 2018-05-02: qty 40

## 2018-05-02 MED ORDER — ONDANSETRON HCL 4 MG/2ML IJ SOLN
INTRAMUSCULAR | Status: AC
  Filled 2018-05-02: qty 4

## 2018-05-02 MED ORDER — SODIUM CHLORIDE 0.9 % IV SOLN
Freq: Once | INTRAVENOUS | Status: AC
  Administered 2018-05-02: 10:00:00 via INTRAVENOUS
  Filled 2018-05-02: qty 250

## 2018-05-02 MED ORDER — ONDANSETRON HCL 4 MG/2ML IJ SOLN
8.0000 mg | Freq: Once | INTRAMUSCULAR | Status: AC
  Administered 2018-05-02: 8 mg via INTRAVENOUS

## 2018-05-02 MED ORDER — PROCHLORPERAZINE MALEATE 10 MG PO TABS: 10.0000 mg | ORAL_TABLET | Freq: Four times a day (QID) | ORAL | 1 refills | Status: DC | PRN

## 2018-05-02 MED ORDER — CYANOCOBALAMIN 1000 MCG/ML IJ SOLN
INTRAMUSCULAR | Status: AC
  Filled 2018-05-02: qty 1

## 2018-05-02 MED ORDER — CYANOCOBALAMIN 1000 MCG/ML IJ SOLN
1000.0000 ug | Freq: Once | INTRAMUSCULAR | Status: AC
  Administered 2018-05-02: 1000 ug via INTRAMUSCULAR

## 2018-05-02 MED ORDER — SODIUM CHLORIDE 0.9 % IV SOLN
200.0000 mg | Freq: Once | INTRAVENOUS | Status: AC
  Administered 2018-05-02: 200 mg via INTRAVENOUS
  Filled 2018-05-02: qty 8

## 2018-05-02 MED ORDER — DEXAMETHASONE SODIUM PHOSPHATE 10 MG/ML IJ SOLN
INTRAMUSCULAR | Status: AC
  Filled 2018-05-02: qty 1

## 2018-05-02 NOTE — Assessment & Plan Note (Addendum)
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 The patient completed a course of palliative radiotherapy to the cervical spine as well as left hip under the care of Dr. Sondra Come.Marland Kitchen He completed induction systemic chemotherapy with carboplatin, Alimta and Keytruda status post 4 cycles.  He has no evidence for disease progression after the induction phase of his treatment. The patient was started on maintenance treatment with Alimta and Keytruda (pembrolizumab) status post 5 cycles.  He has been tolerating this treatment well with no concerning complaints. He had a restaging CT scan and is here to discuss the results.  The patient was seen with Dr. [name].  CT scan results were discussed with the patient which show improvement in his liver lesions and stable disease in his lungs.  Recommend he continue on maintenance Alimta and Keytruda.  He will proceed with cycle #6 today as scheduled.  His ALT is slightly elevated at 53 and okay to proceed with treatment.  The patient will follow-up in 3 weeks for evaluation prior to cycle #7.  The patient was advised to call immediately if he has any concerning symptoms in the interval.  The patient voices understanding of current disease status and treatment options and is in agreement with the current care plan.  All questions were answered. The patient knows to call the clinic with any problems, questions or concerns. We can certainly see the patient much sooner if necessary.

## 2018-05-02 NOTE — Telephone Encounter (Signed)
Scheduled appt per 10/2 los - pt to get an updated schedule next visit.

## 2018-05-02 NOTE — Patient Instructions (Signed)
[facility] Discharge Instructions for Patients Receiving Chemotherapy  Today you received the following chemotherapy agents:  Keytruda and Alimta.  To help prevent nausea and vomiting after your treatment, we encourage you to take your nausea medication as directed.   If you develop nausea and vomiting that is not controlled by your nausea medication, call the clinic.   BELOW ARE SYMPTOMS THAT SHOULD BE REPORTED IMMEDIATELY:  *FEVER GREATER THAN 100.5 F  *CHILLS WITH OR WITHOUT FEVER  NAUSEA AND VOMITING THAT IS NOT CONTROLLED WITH YOUR NAUSEA MEDICATION  *UNUSUAL SHORTNESS OF BREATH  *UNUSUAL BRUISING OR BLEEDING  TENDERNESS IN MOUTH AND THROAT WITH OR WITHOUT PRESENCE OF ULCERS  *URINARY PROBLEMS  *BOWEL PROBLEMS  UNUSUAL RASH Items with * indicate a potential emergency and should be followed up as soon as possible.  Feel free to call the clinic should you have any questions or concerns. The clinic phone number is [phone].  Please show the [place] at check-in to the Emergency Department and triage nurse.

## 2018-05-02 NOTE — Progress Notes (Signed)
[facility] OFFICE PROGRESS NOTE  Anthony Cha, MD [address] [place] [address]  DIAGNOSIS: Stage IVB(T1b, N2, M1c)non-small cell lung cancer, adenocarcinoma presented with right upper lobe lung nodule in addition to right hilar and mediastinal lymphadenopathy as well as metastatic liver and bone disease diagnosed in February 2019.  Biomarker Findings Microsatellite status - MS-Stable Tumor Mutational Burden - TMB-Low (4 Muts/Mb) Genomic Findings For a complete list of the genes assayed, please refer to the Appendix. EGFR exon 20 insertion ([id number]) RB1 loss exons 9-17 TP53 [id number] 7 Disease relevant genes with no reportable alterations: KRAS, ALK, BRAF, MET, RET, ERBB2, ROS1  PDL 1 expression 5%  PRIOR THERAPY:  1) Palliative radiotherapy to the metastatic bone lesions in the cervical spine and left femur under the care of Dr. Sondra Come. 2) Systemic chemotherapy with carboplatin for AUC of 5, Alimta 500 mg/M2 and Keytruda 200 mg IV every 3 weeks.  First dose October 04, 2017.  Status post 4 cycles. 3) palliative stereotactic radiotherapy to the progressive liver lesion.  CURRENT THERAPY: Maintenance treatment with Alimta 500 mg/M2 and Keytruda 200 mg IV every 3 weeks.  Status post 5 cycles.  INTERVAL HISTORY: Anthony [name] 47 y.o. male returns for a routine follow-up visit by himself.  The patient is feeling fine today and has no specific complaints.  He noticed that he had more nausea following his last cycle of treatment which he attributes to completing radiation to the liver around that time.  He has not had any vomiting.  His nausea has resolved.  He denies fevers and chills.  Denies chest pain, shortness breath, cough, hemoptysis.  Denies nausea, vomiting, constipation, diarrhea.  Denies recent weight loss or night sweats.  He had a restaging CT scan and is here to discuss the results.  MEDICAL HISTORY: Past Medical History:   Diagnosis Date  . Acid reflux   . History of radiation therapy 09/21/17-10/04/17   C4 spine 30 Gy in 10 fractions, left femur 30 Gy in 10 fractions  . Non-small cell lung cancer ([facility])   . NSTEMI (non-ST elevated myocardial infarction) ([facility]) 06/13/2017   [name] 06/14/2017  . Tailbone injury since 1993   "cracked"    ALLERGIES:  has No Known Allergies.  MEDICATIONS:  Current Outpatient Medications  Medication Sig Dispense Refill  . acetaminophen (TYLENOL) 500 MG tablet Take 500-1,000 mg by mouth daily as needed for moderate pain or headache.    Marland Kitchen aspirin EC 81 MG EC tablet Take 1 tablet (81 mg total) by mouth daily.    Marland Kitchen atorvastatin (LIPITOR) 40 MG tablet Take 1 tablet (40 mg total) by mouth daily. 90 tablet 3  . cetirizine (ZYRTEC) 10 MG tablet Take 10 mg by mouth daily as needed for allergies.     . chlorproMAZINE (THORAZINE) 25 MG tablet Take 1 tablet (25 mg total) by mouth 3 (three) times daily as needed. 30 tablet 0  . esomeprazole (NEXIUM) 20 MG capsule Take 20 mg by mouth every other day.    . folic acid (FOLVITE) 1 MG tablet Take 1 tablet (1 mg total) by mouth daily. 30 tablet 2  . lisinopril (PRINIVIL,ZESTRIL) 5 MG tablet TAKE 1 TABLET BY MOUTH EVERY DAY 90 tablet 3  . metoprolol tartrate (LOPRESSOR) 25 MG tablet Take 12.5 mg by mouth 2 (two) times daily.    . mirtazapine (REMERON) 30 MG tablet Take 1 tablet (30 mg total) by mouth at bedtime. 30 tablet 2  .  prochlorperazine (COMPAZINE) 10 MG tablet Take 1 tablet (10 mg total) by mouth every 6 (six) hours as needed for nausea or vomiting. 30 tablet 1   No current facility-administered medications for this visit.    Facility-Administered Medications Ordered in Other Visits  Medication Dose Route Frequency Provider Last Rate Last Dose  . 0.9 %  sodium chloride infusion   Intravenous Once [name], MD      . pembrolizumab [facility]) 200 mg in sodium chloride 0.9 % 50 mL chemo infusion  200 mg Intravenous Once [name], MD      . PEMEtrexed (ALIMTA) 1,100 mg in sodium chloride 0.9 % 100 mL chemo infusion  511 mg/m2 (Treatment Plan Recorded) Intravenous Once [name], MD        SURGICAL HISTORY:  Past Surgical History:  Procedure Laterality Date  . CARDIAC CATHETERIZATION  06/14/2017  . CHOLECYSTECTOMY N/A 12/29/2017   Procedure: LAPAROSCOPIC CHOLECYSTECTOMY;  Surgeon: Ralene Ok, MD;  Location: [facility];  Service: General;  Laterality: N/A;  . CHOLECYSTECTOMY, LAPAROSCOPIC N/A 12/29/2017  . CORONARY ARTERY BYPASS GRAFT N/A 06/19/2017   Procedure: CORONARY ARTERY BYPASS GRAFTING (CABG), OFF PUMP, times one using the left internal mammary artery to LAD;  Surgeon: Grace Isaac, MD;  Location: [facility];  Service: Open Heart Surgery;  Laterality: N/A;  . ESOPHAGOGASTRODUODENOSCOPY (EGD) WITH PROPOFOL N/A 11/30/2017   Procedure: ESOPHAGOGASTRODUODENOSCOPY (EGD) WITH PROPOFOL;  Surgeon: Gatha Mayer, MD;  Location: [facility];  Service: Endoscopy;  Laterality: N/A;  . LEFT HEART CATH AND CORONARY ANGIOGRAPHY N/A 06/14/2017   Procedure: LEFT HEART CATH AND CORONARY ANGIOGRAPHY;  Surgeon: Sherren Mocha, MD;  Location: [facility];  Service: Cardiovascular;  Laterality: N/A;  . TEE WITHOUT CARDIOVERSION N/A 06/19/2017   Procedure: TRANSESOPHAGEAL ECHOCARDIOGRAM (TEE);  Surgeon: Grace Isaac, MD;  Location: [facility];  Service: Open Heart Surgery;  Laterality: N/A;  . TONSILLECTOMY  ~ 1977    REVIEW OF SYSTEMS:   Review of Systems  Constitutional: Negative for appetite change, chills, fatigue, fever and unexpected weight change.  HENT:   Negative for mouth sores, nosebleeds, sore throat and trouble swallowing.   Eyes: Negative for eye problems and icterus.  Respiratory: Negative for cough, hemoptysis, shortness of breath and wheezing.   Cardiovascular: Negative for chest pain and leg swelling.  Gastrointestinal: Negative for abdominal pain, constipation, diarrhea, nausea and  vomiting.  Genitourinary: Negative for bladder incontinence, difficulty urinating, dysuria, frequency and hematuria.   Musculoskeletal: Negative for back pain, gait problem, neck pain and neck stiffness.  Skin: Negative for itching and rash.  Neurological: Negative for dizziness, extremity weakness, gait problem, headaches, light-headedness and seizures.  Hematological: Negative for adenopathy. Does not bruise/bleed easily.  Psychiatric/Behavioral: Negative for confusion, depression and sleep disturbance. The patient is not nervous/anxious.     PHYSICAL EXAMINATION:  Blood pressure 125/88, pulse 77, temperature 98.8 F (37.1 C), temperature source Oral, resp. rate 17, height _0  (1.778 m), weight 211 lb 14.4 oz (96.1 kg), SpO2 97 %.  ECOG PERFORMANCE STATUS: 1 - Symptomatic but completely ambulatory  Physical Exam  Constitutional: Oriented to person, place, and time and well-developed, well-nourished, and in no distress. No distress.  HENT:  Head: Normocephalic and atraumatic.  Mouth/Throat: Oropharynx is clear and moist. No oropharyngeal exudate.  Eyes: Conjunctivae are normal. Right eye exhibits no discharge. Left eye exhibits no discharge. No scleral icterus.  Neck: Normal range of motion. Neck supple.  Cardiovascular: Normal rate, regular rhythm, normal heart sounds  and intact distal pulses.   Pulmonary/Chest: Effort normal and breath sounds normal. No respiratory distress. No wheezes. No rales.  Abdominal: Soft. Bowel sounds are normal. Exhibits no distension and no mass. There is no tenderness.  Musculoskeletal: Normal range of motion. Exhibits no edema.  Lymphadenopathy:    No cervical adenopathy.  Neurological: Alert and oriented to person, place, and time. Exhibits normal muscle tone. Gait normal. Coordination normal.  Skin: Skin is warm and dry. No rash noted. Not diaphoretic. No erythema. No pallor.  Psychiatric: Mood, memory and judgment normal.  Vitals  reviewed.  LABORATORY DATA: Lab Results  Component Value Date   WBC 3.3 (L) 05/02/2018   HGB 11.9 (L) 05/02/2018   HCT 34.6 (L) 05/02/2018   MCV 92.3 05/02/2018   PLT 288 05/02/2018      Chemistry      Component Value Date/Time   NA 143 05/02/2018 0814   NA 141 07/17/2017 1006   K 4.1 05/02/2018 0814   CL 108 05/02/2018 0814   CO2 25 05/02/2018 0814   BUN 10 05/02/2018 0814   BUN 12 07/17/2017 1006   CREATININE 1.02 05/02/2018 0814      Component Value Date/Time   CALCIUM 9.7 05/02/2018 0814   ALKPHOS 80 05/02/2018 0814   AST 33 05/02/2018 0814   ALT 53 (H) 05/02/2018 0814   BILITOT 0.5 05/02/2018 0814       RADIOGRAPHIC STUDIES:  Ct Chest W Contrast  Result Date: 05/01/2018 CLINICAL DATA:  Restaging of metastatic non-small cell lung cancer of the right lung EXAM: CT CHEST, ABDOMEN, AND PELVIS WITH CONTRAST TECHNIQUE: Multidetector CT imaging of the chest, abdomen and pelvis was performed following the standard protocol during bolus administration of intravenous contrast. CONTRAST:  130m OMNIPAQUE IOHEXOL 300 MG/ML  SOLN COMPARISON:  Multiple exams, including 02/21/2018 FINDINGS: CT CHEST FINDINGS Cardiovascular: Prior CABG. Mediastinum/Nodes: A right paratracheal node with fatty hilum measures 0.9 cm in short axis on image 16/2, formerly same. No overtly pathologic thoracic adenopathy is identified. Lungs/Pleura: Nodular region of architectural distortion in the right upper lobe difficult to measure given the indistinct margins and general irregularity, but approximately 1.4 by 1.1 cm on image 60/4. By my measurements this lesion was previously 1.3 by 1.1 cm, and visually there is no subjective change. The patchy ground-glass opacities in the right lower lobe have essentially resolved. Musculoskeletal: Mild thoracic spondylosis. CT ABDOMEN PELVIS FINDINGS Hepatobiliary: Markedly improved appearance of the previous posterior right hepatic lobe tumor. There may be some slight  capsular thickening in this vicinity up to about 6 mm but the masslike appearance shown on the prior exam which previously measured 2.1 by 2.9 cm is no longer present. The previously hypermetabolic lesion in segment 4 of the liver measures 1.4 by 1.2 cm on image 46/2, formerly 1.5 by 1.6 cm. No new liver lesion identified.  The gallbladder is absent. Pancreas: Unremarkable Spleen: Unremarkable Adrenals/Urinary Tract: Unremarkable Stomach/Bowel: Sigmoid colon diverticulosis. Vascular/Lymphatic: Minimal atherosclerotic calcification of the aortic bifurcation. No pathologic adenopathy identified. Reproductive: Unremarkable Other: No supplemental non-categorized findings. Musculoskeletal: Intervertebral spurring at L5-S1. IMPRESSION: 1. Significantly improved appearance in the liver, with the mass posteriorly in the right hepatic lobe having almost resolved, with only minimal adjacent thickening of the capsule/diaphragm at the location of the prior posterior right hepatic lobe mass. In addition the previously hypermetabolic lesion segment 4 of the liver is mildly reduced in size. 2. The irregular right upper lobe nodule is roughly stable at 1.4 by 1.1 cm  by my measurements. The patchy ground-glass opacities in the right lower lobe have resolved. 3. No new lesions are identified. 4. Sigmoid colon diverticulosis. Electronically Signed   By: Van Clines M.D.   On: 05/01/2018 14:32   Ct Abdomen Pelvis W Contrast  Result Date: 05/01/2018 CLINICAL DATA:  Restaging of metastatic non-small cell lung cancer of the right lung EXAM: CT CHEST, ABDOMEN, AND PELVIS WITH CONTRAST TECHNIQUE: Multidetector CT imaging of the chest, abdomen and pelvis was performed following the standard protocol during bolus administration of intravenous contrast. CONTRAST:  170m OMNIPAQUE IOHEXOL 300 MG/ML  SOLN COMPARISON:  Multiple exams, including 02/21/2018 FINDINGS: CT CHEST FINDINGS Cardiovascular: Prior CABG. Mediastinum/Nodes: A  right paratracheal node with fatty hilum measures 0.9 cm in short axis on image 16/2, formerly same. No overtly pathologic thoracic adenopathy is identified. Lungs/Pleura: Nodular region of architectural distortion in the right upper lobe difficult to measure given the indistinct margins and general irregularity, but approximately 1.4 by 1.1 cm on image 60/4. By my measurements this lesion was previously 1.3 by 1.1 cm, and visually there is no subjective change. The patchy ground-glass opacities in the right lower lobe have essentially resolved. Musculoskeletal: Mild thoracic spondylosis. CT ABDOMEN PELVIS FINDINGS Hepatobiliary: Markedly improved appearance of the previous posterior right hepatic lobe tumor. There may be some slight capsular thickening in this vicinity up to about 6 mm but the masslike appearance shown on the prior exam which previously measured 2.1 by 2.9 cm is no longer present. The previously hypermetabolic lesion in segment 4 of the liver measures 1.4 by 1.2 cm on image 46/2, formerly 1.5 by 1.6 cm. No new liver lesion identified.  The gallbladder is absent. Pancreas: Unremarkable Spleen: Unremarkable Adrenals/Urinary Tract: Unremarkable Stomach/Bowel: Sigmoid colon diverticulosis. Vascular/Lymphatic: Minimal atherosclerotic calcification of the aortic bifurcation. No pathologic adenopathy identified. Reproductive: Unremarkable Other: No supplemental non-categorized findings. Musculoskeletal: Intervertebral spurring at L5-S1. IMPRESSION: 1. Significantly improved appearance in the liver, with the mass posteriorly in the right hepatic lobe having almost resolved, with only minimal adjacent thickening of the capsule/diaphragm at the location of the prior posterior right hepatic lobe mass. In addition the previously hypermetabolic lesion segment 4 of the liver is mildly reduced in size. 2. The irregular right upper lobe nodule is roughly stable at 1.4 by 1.1 cm by my measurements. The patchy  ground-glass opacities in the right lower lobe have resolved. 3. No new lesions are identified. 4. Sigmoid colon diverticulosis. Electronically Signed   By: WVan ClinesM.D.   On: 05/01/2018 14:32     ASSESSMENT/PLAN:  Stage 4 lung cancer, right ([place] This is a very pleasant 47year old white male with stage IV non-small cell lung cancer, adenocarcinoma with resistant EGFR mutation in exon 20 and PDL 1 expression of 5%. The patient completed a course of palliative radiotherapy to the cervical spine as well as left hip under the care of Dr. KSondra Come.Marland KitchenHe completed induction systemic chemotherapy with carboplatin, Alimta and Keytruda status post 4 cycles.  He has no evidence for disease progression after the induction phase of his treatment. The patient was started on maintenance treatment with Alimta and Keytruda (pembrolizumab) status post 5 cycles.  He has been tolerating this treatment well with no concerning complaints. He had a restaging CT scan and is here to discuss the results.  The patient was seen with Dr. MJulien Nordmann  CT scan results were discussed with the patient which show improvement in his liver lesions and stable disease in his lungs.  Recommend he continue on maintenance Alimta and Keytruda.  He will proceed with cycle #6 today as scheduled.  His ALT is slightly elevated at 53 and okay to proceed with treatment.  The patient will follow-up in 3 weeks for evaluation prior to cycle #7.  The patient was advised to call immediately if he has any concerning symptoms in the interval.  The patient voices understanding of current disease status and treatment options and is in agreement with the current care plan.  All questions were answered. The patient knows to call the clinic with any problems, questions or concerns. We can certainly see the patient much sooner if necessary.   No orders of the defined types were placed in this encounter.    Mikey Bussing, DNP, AGPCNP-BC,  AOCNP 05/02/18   ADDENDUM: Hematology/Oncology Attending: I had a face-to-face encounter with the patient today.  I recommended his care plan.  This is a very pleasant 47 years old white male with metastatic non-small cell lung cancer, adenocarcinoma status post induction systemic chemotherapy with carboplatin, Alimta and Ketruda (pembrolizumab) for 4 cycles.  He is currently undergoing maintenance treatment with Alimta and Keytruda status post 5 cycles of the maintenance treatment.  The patient also recently underwent stereotactic radiotherapy to progressive liver lesions. He is feeling fine today with no concerning complaints. He had repeat CT scan of the chest, abdomen and pelvis performed recently.  I personally and independently reviewed the scan images and discussed the results with the patient today.  His a scan showed significant improvement in the liver metastasis after the stereotactic radiotherapy and no other evidence for disease progression. I recommended for the patient to continue his current treatment with Alimta and Ketruda (pembrolizumab) and he will proceed with cycle #6 of this maintenance therapy today. He will come back for follow-up visit in 3 weeks for evaluation before the next cycle of his treatment. He was advised to call immediately if he has any concerning symptoms in the interval.  Disclaimer: This note was dictated with voice recognition software. Similar sounding words can inadvertently be transcribed and may be missed upon review. Eilleen Kempf, MD 05/02/18

## 2018-05-07 ENCOUNTER — Other Ambulatory Visit: Payer: Self-pay

## 2018-05-07 ENCOUNTER — Encounter: Payer: Self-pay | Admitting: Radiation Oncology

## 2018-05-07 ENCOUNTER — Ambulatory Visit
Admission: RE | Admit: 2018-05-07 | Discharge: 2018-05-07 | Disposition: A | Discharge status: Home / Self Care | Payer: Medicaid Other | Source: Ambulatory Visit | Attending: Radiation Oncology | Admitting: Radiation Oncology

## 2018-05-07 VITALS — BP 122/73 | HR 101 | Temp 99.4°F | Resp 20 | Ht 70.0 in | Wt 209.0 lb

## 2018-05-07 DIAGNOSIS — C787 Secondary malignant neoplasm of liver and intrahepatic bile duct: Secondary | ICD-10-CM | POA: Diagnosis present

## 2018-05-07 DIAGNOSIS — Z7982 Long term (current) use of aspirin: Secondary | ICD-10-CM | POA: Diagnosis not present

## 2018-05-07 DIAGNOSIS — C3491 Malignant neoplasm of unspecified part of right bronchus or lung: Secondary | ICD-10-CM | POA: Insufficient documentation

## 2018-05-07 DIAGNOSIS — Z79899 Other long term (current) drug therapy: Secondary | ICD-10-CM | POA: Insufficient documentation

## 2018-05-07 DIAGNOSIS — Z923 Personal history of irradiation: Secondary | ICD-10-CM | POA: Insufficient documentation

## 2018-05-07 NOTE — Progress Notes (Signed)
Pt presents today for f/u with Dr. Sondra Come. Pt is unaccompanied. Pt reports fatigue and "belly" pain is attributed to chemotherapy. Last chemotherapy infusion was Wednesday, Oct 2. Pt denies any side effects attributed to radiation therapy.   BP 122/73 (BP Location: Right Arm, Patient Position: Sitting)   Pulse (!) 101   Temp 99.4 F (37.4 C) (Oral)   Resp 20   Ht 5\' 10"  (1.778 m)   Wt 209 lb (94.8 kg)   SpO2 99%   BMI 29.99 kg/m   Wt Readings from Last 3 Encounters:  05/07/18 209 lb (94.8 kg)  05/02/18 211 lb 14.4 oz (96.1 kg)  04/25/18 209 lb (94.8 kg)   Loma Sousa, RN BSN

## 2018-05-07 NOTE — Progress Notes (Signed)
Radiation Oncology         [phone] ________________________________  Name: Anthony [name] MRN: [id number]  Date: 05/07/2018  DOB: 16-May-1971  Follow-Up Visit Note  CC: [name], MD  [name], MD    ICD-10-CM   1. 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  He continues on chemotherapy and has some associated side effects with this therapy but otherwise is doing well .He tolerated his stereotactic body radiation therapy directed at the larger liver lesion well without any obvious side effects               Follow up chest/abdomen/pelvis CT scan performed on 05/01/18 showed mostly resolved right hepatic lobe mass, mildly reduced segment 4 liver lesion, stable right upper lobe nodule (1.4 x 1.1 cm) with resolved opacities, and no new lesions.                 ALLERGIES:  has No Known Allergies.  Meds: Current Outpatient Medications  Medication Sig Dispense Refill  . acetaminophen (TYLENOL) 500 MG tablet Take 500-1,000 mg by mouth daily as needed for moderate pain or headache.    Marland Kitchen aspirin EC 81 MG EC tablet Take 1 tablet (81 mg total) by mouth daily.    Marland Kitchen atorvastatin (LIPITOR) 40 MG tablet Take 1 tablet (40 mg total) by mouth daily. 90 tablet 3  . cetirizine (ZYRTEC) 10 MG tablet Take 10 mg by mouth daily as needed for allergies.     . chlorproMAZINE (THORAZINE) 25 MG tablet Take 1 tablet (25 mg total) by mouth 3 (three) times daily as needed. 30 tablet 0  . esomeprazole (NEXIUM) 20 MG capsule Take 20 mg by mouth every other day.    . folic acid (FOLVITE) 1 MG  tablet Take 1 tablet (1 mg total) by mouth daily. 30 tablet 2  . lisinopril (PRINIVIL,ZESTRIL) 5 MG tablet TAKE 1 TABLET BY MOUTH EVERY DAY 90 tablet 3  . metoprolol tartrate (LOPRESSOR) 25 MG tablet Take 12.5 mg by mouth 2 (two) times daily.    . mirtazapine (REMERON) 30 MG tablet Take 1 tablet (30 mg total) by mouth at bedtime. 30 tablet 2  . prochlorperazine (COMPAZINE) 10 MG tablet Take 1 tablet (10 mg total) by mouth every 6 (six) hours as needed for nausea or vomiting. 30 tablet 1   No current facility-administered medications for this encounter.     Physical Findings: The patient is in no acute distress. Patient is alert and oriented.  height is 5\' 10"  (1.778 m) and weight is 209 lb (94.8 kg). His oral temperature is 99.4 F (37.4 C). His blood pressure is 122/73 and his pulse is 101 (abnormal). His respiration is 20 and oxygen saturation is 99%.  Lungs are clear to auscultation bilaterally. Heart has regular rate and rhythm. No palpable cervical, supraclavicular, or axillary adenopathy. Abdomen soft, non-tender, normal bowel sounds.   Lab Findings: Lab Results  Component Value Date   WBC 3.3 (L) 05/02/2018   HGB 11.9 (L) 05/02/2018   HCT 34.6 (L) 05/02/2018  MCV 92.3 05/02/2018   PLT 288 05/02/2018    Radiographic Findings: Ct Chest W Contrast  Result Date: 05/01/2018 CLINICAL DATA:  Restaging of metastatic non-small cell lung cancer of the right lung EXAM: CT CHEST, ABDOMEN, AND PELVIS WITH CONTRAST TECHNIQUE: Multidetector CT imaging of the chest, abdomen and pelvis was performed following the standard protocol during bolus administration of intravenous contrast. CONTRAST:  142mL OMNIPAQUE IOHEXOL 300 MG/ML  SOLN COMPARISON:  Multiple exams, including 02/21/2018 FINDINGS: CT CHEST FINDINGS Cardiovascular: Prior CABG. Mediastinum/Nodes: A right paratracheal node with fatty hilum measures 0.9 cm in short axis on image 16/2, formerly same. No overtly pathologic thoracic adenopathy  is identified. Lungs/Pleura: Nodular region of architectural distortion in the right upper lobe difficult to measure given the indistinct margins and general irregularity, but approximately 1.4 by 1.1 cm on image 60/4. By my measurements this lesion was previously 1.3 by 1.1 cm, and visually there is no subjective change. The patchy ground-glass opacities in the right lower lobe have essentially resolved. Musculoskeletal: Mild thoracic spondylosis. CT ABDOMEN PELVIS FINDINGS Hepatobiliary: Markedly improved appearance of the previous posterior right hepatic lobe tumor. There may be some slight capsular thickening in this vicinity up to about 6 mm but the masslike appearance shown on the prior exam which previously measured 2.1 by 2.9 cm is no longer present. The previously hypermetabolic lesion in segment 4 of the liver measures 1.4 by 1.2 cm on image 46/2, formerly 1.5 by 1.6 cm. No new liver lesion identified.  The gallbladder is absent. Pancreas: Unremarkable Spleen: Unremarkable Adrenals/Urinary Tract: Unremarkable Stomach/Bowel: Sigmoid colon diverticulosis. Vascular/Lymphatic: Minimal atherosclerotic calcification of the aortic bifurcation. No pathologic adenopathy identified. Reproductive: Unremarkable Other: No supplemental non-categorized findings. Musculoskeletal: Intervertebral spurring at L5-S1. IMPRESSION: 1. Significantly improved appearance in the liver, with the mass posteriorly in the right hepatic lobe having almost resolved, with only minimal adjacent thickening of the capsule/diaphragm at the location of the prior posterior right hepatic lobe mass. In addition the previously hypermetabolic lesion segment 4 of the liver is mildly reduced in size. 2. The irregular right upper lobe nodule is roughly stable at 1.4 by 1.1 cm by my measurements. The patchy ground-glass opacities in the right lower lobe have resolved. 3. No new lesions are identified. 4. Sigmoid colon diverticulosis. Electronically  Signed   By: Van Clines M.D.   On: 05/01/2018 14:32   Ct Abdomen Pelvis W Contrast  Result Date: 05/01/2018 CLINICAL DATA:  Restaging of metastatic non-small cell lung cancer of the right lung EXAM: CT CHEST, ABDOMEN, AND PELVIS WITH CONTRAST TECHNIQUE: Multidetector CT imaging of the chest, abdomen and pelvis was performed following the standard protocol during bolus administration of intravenous contrast. CONTRAST:  156mL OMNIPAQUE IOHEXOL 300 MG/ML  SOLN COMPARISON:  Multiple exams, including 02/21/2018 FINDINGS: CT CHEST FINDINGS Cardiovascular: Prior CABG. Mediastinum/Nodes: A right paratracheal node with fatty hilum measures 0.9 cm in short axis on image 16/2, formerly same. No overtly pathologic thoracic adenopathy is identified. Lungs/Pleura: Nodular region of architectural distortion in the right upper lobe difficult to measure given the indistinct margins and general irregularity, but approximately 1.4 by 1.1 cm on image 60/4. By my measurements this lesion was previously 1.3 by 1.1 cm, and visually there is no subjective change. The patchy ground-glass opacities in the right lower lobe have essentially resolved. Musculoskeletal: Mild thoracic spondylosis. CT ABDOMEN PELVIS FINDINGS Hepatobiliary: Markedly improved appearance of the previous posterior right hepatic lobe tumor. There may be some slight capsular thickening in this vicinity up  to about 6 mm but the masslike appearance shown on the prior exam which previously measured 2.1 by 2.9 cm is no longer present. The previously hypermetabolic lesion in segment 4 of the liver measures 1.4 by 1.2 cm on image 46/2, formerly 1.5 by 1.6 cm. No new liver lesion identified.  The gallbladder is absent. Pancreas: Unremarkable Spleen: Unremarkable Adrenals/Urinary Tract: Unremarkable Stomach/Bowel: Sigmoid colon diverticulosis. Vascular/Lymphatic: Minimal atherosclerotic calcification of the aortic bifurcation. No pathologic adenopathy identified.  Reproductive: Unremarkable Other: No supplemental non-categorized findings. Musculoskeletal: Intervertebral spurring at L5-S1. IMPRESSION: 1. Significantly improved appearance in the liver, with the mass posteriorly in the right hepatic lobe having almost resolved, with only minimal adjacent thickening of the capsule/diaphragm at the location of the prior posterior right hepatic lobe mass. In addition the previously hypermetabolic lesion segment 4 of the liver is mildly reduced in size. 2. The irregular right upper lobe nodule is roughly stable at 1.4 by 1.1 cm by my measurements. The patchy ground-glass opacities in the right lower lobe have resolved. 3. No new lesions are identified. 4. Sigmoid colon diverticulosis. Electronically Signed   By: Van Clines M.D.   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 The patient will continue close follow-up in medical oncology and continue on chemotherapy as above.  -----------------------------------  Blair Promise, PhD, MD  This document serves as a record of services personally performed by Gery Pray, MD. It was created on his behalf by Wilburn Mylar, a trained medical scribe. The creation of this record is based on the scribe's personal observations and the provider's statements to them. This document has been checked and approved by the attending provider.

## 2018-05-14 ENCOUNTER — Telehealth: Payer: Self-pay | Admitting: *Deleted

## 2018-05-14 NOTE — Telephone Encounter (Signed)
TC to patient in regards to his earlier call about abd pain. Spoke with patient.  He states the pain is periodic - occasionally with a deep breath. The pain is in the left lower quadrant area.  He also states that sometimes the pain occurs when he is walking. The pain can range from 3-6, sharp in nature. Denies nausea/vomiting/diarrhea/fever or chills.  He states his appetite is good, drinking fluids well.  He completed XRT to his lever recently. He is also receiving Chemo with Keytruda/Alimta-last treatment was 05/02/18. Patient thinks the pain/discomfort is due to constipation. His last BM was on Friday after some 'mild laxatives', though he states the stool was more liquid in nature.  Asked pt if he felt he needed to be seen today d/t pain. Pt states he would prefer to monitor the situation. Advised pt to continue to eat solids and push fluids, laxatives as needed and to call back if symptoms do not resolve. Or no BM in the next day or so, or if any other symptoms arise. Advised that we could see him in the Symptom Management Clinic if pain persists. Pt is agreeable and will call back if pain persists, new symptoms arise or no BM.

## 2018-05-14 NOTE — Telephone Encounter (Signed)
If he is having abdominal pain, I recommend that he be seen in our symptom management clinic today.  Please call him and see if he can come in.

## 2018-05-14 NOTE — Telephone Encounter (Signed)
"  Anthony [name] [phone])  When I deep breathe I have sharp pain to left lower abdomen.  Rate as a six or can be a two on pain scale.  No pain medicatiopns used.  Started two days ago, went away and came back this morning.  Finished radiation to liver two weeks ago.  Slight constipation straining with yesterday's BM.  Used mild laxative which helped."

## 2018-05-23 ENCOUNTER — Inpatient Hospital Stay: Payer: Medicaid Other

## 2018-05-23 ENCOUNTER — Encounter: Payer: Self-pay | Admitting: Oncology

## 2018-05-23 ENCOUNTER — Inpatient Hospital Stay (HOSPITAL_BASED_OUTPATIENT_CLINIC_OR_DEPARTMENT_OTHER): Payer: Medicaid Other | Admitting: Oncology

## 2018-05-23 VITALS — BP 120/83 | HR 80 | Temp 98.3°F | Resp 17 | Wt 210.3 lb

## 2018-05-23 DIAGNOSIS — C3491 Malignant neoplasm of unspecified part of right bronchus or lung: Secondary | ICD-10-CM

## 2018-05-23 DIAGNOSIS — I252 Old myocardial infarction: Secondary | ICD-10-CM

## 2018-05-23 DIAGNOSIS — K5730 Diverticulosis of large intestine without perforation or abscess without bleeding: Secondary | ICD-10-CM

## 2018-05-23 DIAGNOSIS — C7951 Secondary malignant neoplasm of bone: Secondary | ICD-10-CM

## 2018-05-23 DIAGNOSIS — R110 Nausea: Secondary | ICD-10-CM

## 2018-05-23 DIAGNOSIS — Z5111 Encounter for antineoplastic chemotherapy: Secondary | ICD-10-CM

## 2018-05-23 DIAGNOSIS — Z923 Personal history of irradiation: Secondary | ICD-10-CM

## 2018-05-23 DIAGNOSIS — C787 Secondary malignant neoplasm of liver and intrahepatic bile duct: Secondary | ICD-10-CM

## 2018-05-23 DIAGNOSIS — C778 Secondary and unspecified malignant neoplasm of lymph nodes of multiple regions: Secondary | ICD-10-CM

## 2018-05-23 DIAGNOSIS — C3411 Malignant neoplasm of upper lobe, right bronchus or lung: Secondary | ICD-10-CM

## 2018-05-23 DIAGNOSIS — Z7982 Long term (current) use of aspirin: Secondary | ICD-10-CM

## 2018-05-23 DIAGNOSIS — Z79899 Other long term (current) drug therapy: Secondary | ICD-10-CM

## 2018-05-23 DIAGNOSIS — Z5112 Encounter for antineoplastic immunotherapy: Secondary | ICD-10-CM

## 2018-05-23 DIAGNOSIS — K5900 Constipation, unspecified: Secondary | ICD-10-CM

## 2018-05-23 DIAGNOSIS — K219 Gastro-esophageal reflux disease without esophagitis: Secondary | ICD-10-CM

## 2018-05-23 LAB — CMP (CANCER CENTER ONLY)
ALBUMIN: 3.5 g/dL (ref 3.5–5.0)
ALK PHOS: 95 U/L (ref 38–126)
ALT: 36 U/L (ref 0–44)
AST: 30 U/L (ref 15–41)
Anion gap: 13 (ref 5–15)
BILIRUBIN TOTAL: 0.7 mg/dL (ref 0.3–1.2)
BUN: 8 mg/dL (ref 6–20)
CO2: 24 mmol/L (ref 22–32)
Calcium: 10 mg/dL (ref 8.9–10.3)
Chloride: 107 mmol/L (ref 98–111)
Creatinine: 1 mg/dL (ref 0.61–1.24)
GFR, Est AFR Am: >60 mL/min (ref >60)
GFR, Estimated: >60 mL/min (ref >60)
GLUCOSE: 88 mg/dL (ref 70–99)
Potassium: 4 mmol/L (ref 3.5–5.1)
SODIUM: 144 mmol/L (ref 135–145)
TOTAL PROTEIN: 7.4 g/dL (ref 6.5–8.1)

## 2018-05-23 LAB — CBC WITH DIFFERENTIAL (CANCER CENTER ONLY)
Abs Immature Granulocytes: 0.01 10*3/uL (ref 0.00–0.07)
BASOS ABS: 0 10*3/uL (ref 0.0–0.1)
Basophils Relative: 1 %
EOS ABS: 0 10*3/uL (ref 0.0–0.5)
Eosinophils Relative: 1 %
HEMATOCRIT: 35.3 % — AB (ref 39.0–52.0)
Hemoglobin: 12 g/dL — ABNORMAL LOW (ref 13.0–17.0)
IMMATURE GRANULOCYTES: 0 %
Lymphocytes Relative: 30 %
Lymphs Abs: 1 10*3/uL (ref 0.7–4.0)
MCH: 31.7 pg (ref 26.0–34.0)
MCHC: 34 g/dL (ref 30.0–36.0)
MCV: 93.1 fL (ref 80.0–100.0)
MONOS PCT: 13 %
Monocytes Absolute: 0.4 10*3/uL (ref 0.1–1.0)
NEUTROS PCT: 55 %
NRBC: 0 % (ref 0.0–0.2)
Neutro Abs: 1.8 10*3/uL (ref 1.7–7.7)
Platelet Count: 350 10*3/uL (ref 150–400)
RBC: 3.79 MIL/uL — ABNORMAL LOW (ref 4.22–5.81)
RDW: 15.3 % (ref 11.5–15.5)
WBC Count: 3.3 10*3/uL — ABNORMAL LOW (ref 4.0–10.5)

## 2018-05-23 LAB — TSH: TSH: 2.571 u[IU]/mL (ref 0.320–4.118)

## 2018-05-23 MED ORDER — ONDANSETRON HCL 4 MG/2ML IJ SOLN
INTRAMUSCULAR | Status: AC
  Filled 2018-05-23: qty 4

## 2018-05-23 MED ORDER — DEXAMETHASONE SODIUM PHOSPHATE 10 MG/ML IJ SOLN
INTRAMUSCULAR | Status: AC
  Filled 2018-05-23: qty 1

## 2018-05-23 MED ORDER — ONDANSETRON HCL 4 MG/2ML IJ SOLN
8.0000 mg | Freq: Once | INTRAMUSCULAR | Status: AC
  Administered 2018-05-23: 8 mg via INTRAVENOUS

## 2018-05-23 MED ORDER — SODIUM CHLORIDE 0.9 % IV SOLN
Freq: Once | INTRAVENOUS | Status: AC
  Administered 2018-05-23: 09:00:00 via INTRAVENOUS
  Filled 2018-05-23: qty 250

## 2018-05-23 MED ORDER — DEXAMETHASONE SODIUM PHOSPHATE 10 MG/ML IJ SOLN
10.0000 mg | Freq: Once | INTRAMUSCULAR | Status: AC
  Administered 2018-05-23: 10 mg via INTRAVENOUS

## 2018-05-23 MED ORDER — SODIUM CHLORIDE 0.9 % IV SOLN
510.0000 mg/m2 | Freq: Once | INTRAVENOUS | Status: AC
  Administered 2018-05-23: 1100 mg via INTRAVENOUS
  Filled 2018-05-23: qty 40

## 2018-05-23 MED ORDER — SODIUM CHLORIDE 0.9 % IV SOLN
200.0000 mg | Freq: Once | INTRAVENOUS | Status: AC
  Administered 2018-05-23: 200 mg via INTRAVENOUS
  Filled 2018-05-23: qty 8

## 2018-05-23 NOTE — Progress Notes (Signed)
[facility] OFFICE PROGRESS NOTE  [name], MD [address] [place] [address]  DIAGNOSIS:Stage IVB(T1b, N2, M1c)non-small cell lung cancer, adenocarcinoma presented with right upper lobe lung nodule in addition to right hilar and mediastinal lymphadenopathy as well as metastatic liver and bone disease diagnosed in February 2019.  Biomarker Findings Microsatellite status - MS-Stable Tumor Mutational Burden - TMB-Low (4 Muts/Mb) Genomic Findings For a complete list of the genes assayed, please refer to the Appendix. EGFR exon 20 insertion ([id number]) RB1 loss exons 9-17 TP53 [id number] 7 Disease relevant genes with no reportable alterations: KRAS, ALK, BRAF, MET, RET, ERBB2, ROS1  PDL 1 expression5%  PRIOR THERAPY: 1)Palliative radiotherapy to the metastatic bone lesions in the cervical spine and left femur under the care of Dr. Sondra Come. 2)Systemic chemotherapy with carboplatin for AUC of 5, Alimta 500 mg/M2 and Keytruda 200 mg IV every 3 weeks. First dose October 04, 2017. Status post 4 cycles. 3)palliative stereotactic radiotherapy to the progressive liver lesion.  CURRENT THERAPY:Maintenance treatment with Alimta 500 mg/M2 and Keytruda 200 mg IV every 3 weeks. Status post 6 cycles.  INTERVAL HISTORY: Anthony [name] Anthony [name] 47 y.o. male returns for routine follow-up visit by himself.  The patient is feeling fine today and has no specific complaints except for constipation.  He has some abdominal discomfort which resolved with a bowel movement.  He is trying to drink prune juice and eat more foods that will help resolve his constipation.  He has not tried any over-the-counter medications.  He denies fevers and chills.  Denies chest pain, shortness of breath, cough, hemoptysis.  He reports mild nausea but no vomiting.  Denies diarrhea.  Denies recent weight loss or night sweats.  The patient is here for evaluation prior to cycle #7 of his  maintenance Alimta and Keytruda.  MEDICAL HISTORY: Past Medical History:  Diagnosis Date  . Acid reflux   . History of radiation therapy 09/21/17-10/04/17   C4 spine 30 Gy in 10 fractions, left femur 30 Gy in 10 fractions  . Non-small cell lung cancer ([facility])   . NSTEMI (non-ST elevated myocardial infarction) ([facility]) 06/13/2017   [name] 06/14/2017  . Tailbone injury since 1993   "cracked"    ALLERGIES:  has No Known Allergies.  MEDICATIONS:  Current Outpatient Medications  Medication Sig Dispense Refill  . acetaminophen (TYLENOL) 500 MG tablet Take 500-1,000 mg by mouth daily as needed for moderate pain or headache.    Marland Kitchen aspirin EC 81 MG EC tablet Take 1 tablet (81 mg total) by mouth daily.    Marland Kitchen atorvastatin (LIPITOR) 40 MG tablet Take 1 tablet (40 mg total) by mouth daily. 90 tablet 3  . cetirizine (ZYRTEC) 10 MG tablet Take 10 mg by mouth daily as needed for allergies.     . chlorproMAZINE (THORAZINE) 25 MG tablet Take 1 tablet (25 mg total) by mouth 3 (three) times daily as needed. 30 tablet 0  . esomeprazole (NEXIUM) 20 MG capsule Take 20 mg by mouth every other day.    . folic acid (FOLVITE) 1 MG tablet Take 1 tablet (1 mg total) by mouth daily. 30 tablet 2  . lisinopril (PRINIVIL,ZESTRIL) 5 MG tablet TAKE 1 TABLET BY MOUTH EVERY DAY 90 tablet 3  . metoprolol tartrate (LOPRESSOR) 25 MG tablet Take 12.5 mg by mouth 2 (two) times daily.    . mirtazapine (REMERON) 30 MG tablet Take 1 tablet (30 mg total) by mouth at bedtime. 30 tablet 2  .  prochlorperazine (COMPAZINE) 10 MG tablet Take 1 tablet (10 mg total) by mouth every 6 (six) hours as needed for nausea or vomiting. 30 tablet 1   No current facility-administered medications for this visit.     SURGICAL HISTORY:  Past Surgical History:  Procedure Laterality Date  . CARDIAC CATHETERIZATION  06/14/2017  . CHOLECYSTECTOMY N/A 12/29/2017   Procedure: LAPAROSCOPIC CHOLECYSTECTOMY;  Surgeon: Ralene Ok, MD;  Location: [facility];   Service: General;  Laterality: N/A;  . CHOLECYSTECTOMY, LAPAROSCOPIC N/A 12/29/2017  . CORONARY ARTERY BYPASS GRAFT N/A 06/19/2017   Procedure: CORONARY ARTERY BYPASS GRAFTING (CABG), OFF PUMP, times one using the left internal mammary artery to LAD;  Surgeon: Grace Isaac, MD;  Location: [facility];  Service: Open Heart Surgery;  Laterality: N/A;  . ESOPHAGOGASTRODUODENOSCOPY (EGD) WITH PROPOFOL N/A 11/30/2017   Procedure: ESOPHAGOGASTRODUODENOSCOPY (EGD) WITH PROPOFOL;  Surgeon: Gatha Mayer, MD;  Location: [facility];  Service: Endoscopy;  Laterality: N/A;  . LEFT HEART CATH AND CORONARY ANGIOGRAPHY N/A 06/14/2017   Procedure: LEFT HEART CATH AND CORONARY ANGIOGRAPHY;  Surgeon: Sherren Mocha, MD;  Location: [facility];  Service: Cardiovascular;  Laterality: N/A;  . TEE WITHOUT CARDIOVERSION N/A 06/19/2017   Procedure: TRANSESOPHAGEAL ECHOCARDIOGRAM (TEE);  Surgeon: Grace Isaac, MD;  Location: [facility];  Service: Open Heart Surgery;  Laterality: N/A;  . TONSILLECTOMY  ~ 1977    REVIEW OF SYSTEMS:   Review of Systems  Constitutional: Negative for appetite change, chills, fatigue, fever and unexpected weight change.  HENT:   Negative for mouth sores, nosebleeds, sore throat and trouble swallowing.   Eyes: Negative for eye problems and icterus.  Respiratory: Negative for cough, hemoptysis, shortness of breath and wheezing.   Cardiovascular: Negative for chest pain and leg swelling.  Gastrointestinal: Negative for abdominal pain, diarrhea, and vomiting.  Positive for intermittent nausea and constipation. Genitourinary: Negative for bladder incontinence, difficulty urinating, dysuria, frequency and hematuria.   Musculoskeletal: Negative for back pain, gait problem, neck pain and neck stiffness.  Skin: Negative for itching and rash.  Neurological: Negative for dizziness, extremity weakness, gait problem, headaches, light-headedness and seizures.  Hematological: Negative for  adenopathy. Does not bruise/bleed easily.  Psychiatric/Behavioral: Negative for confusion, depression and sleep disturbance. The patient is not nervous/anxious.     PHYSICAL EXAMINATION:  Blood pressure 120/83, pulse 80, temperature 98.3 F (36.8 C), temperature source Oral, resp. rate 17, weight 210 lb 5 oz (95.4 kg), SpO2 100 %.  ECOG PERFORMANCE STATUS: 1 - Symptomatic but completely ambulatory  Physical Exam  Constitutional: Oriented to person, place, and time and well-developed, well-nourished, and in no distress. No distress.  HENT:  Head: Normocephalic and atraumatic.  Mouth/Throat: Oropharynx is clear and moist. No oropharyngeal exudate.  Eyes: Conjunctivae are normal. Right eye exhibits no discharge. Left eye exhibits no discharge. No scleral icterus.  Neck: Normal range of motion. Neck supple.  Cardiovascular: Normal rate, regular rhythm, normal heart sounds and intact distal pulses.   Pulmonary/Chest: Effort normal and breath sounds normal. No respiratory distress. No wheezes. No rales.  Abdominal: Soft. Bowel sounds are normal. Exhibits no distension and no mass. There is no tenderness.  Musculoskeletal: Normal range of motion. Exhibits no edema.  Lymphadenopathy:    No cervical adenopathy.  Neurological: Alert and oriented to person, place, and time. Exhibits normal muscle tone. Gait normal. Coordination normal.  Skin: Skin is warm and dry. No rash noted. Not diaphoretic. No erythema. No pallor.  Psychiatric: Mood, memory and judgment normal.  Vitals reviewed.  LABORATORY DATA: Lab Results  Component Value Date   WBC 3.3 (L) 05/23/2018   HGB 12.0 (L) 05/23/2018   HCT 35.3 (L) 05/23/2018   MCV 93.1 05/23/2018   PLT 350 05/23/2018      Chemistry      Component Value Date/Time   NA 143 05/02/2018 0814   NA 141 07/17/2017 1006   K 4.1 05/02/2018 0814   CL 108 05/02/2018 0814   CO2 25 05/02/2018 0814   BUN 10 05/02/2018 0814   BUN 12 07/17/2017 1006    CREATININE 1.02 05/02/2018 0814      Component Value Date/Time   CALCIUM 9.7 05/02/2018 0814   ALKPHOS 80 05/02/2018 0814   AST 33 05/02/2018 0814   ALT 53 (H) 05/02/2018 0814   BILITOT 0.5 05/02/2018 0814       RADIOGRAPHIC STUDIES:  Ct Chest W Contrast  Result Date: 05/01/2018 CLINICAL DATA:  Restaging of metastatic non-small cell lung cancer of the right lung EXAM: CT CHEST, ABDOMEN, AND PELVIS WITH CONTRAST TECHNIQUE: Multidetector CT imaging of the chest, abdomen and pelvis was performed following the standard protocol during bolus administration of intravenous contrast. CONTRAST:  180m OMNIPAQUE IOHEXOL 300 MG/ML  SOLN COMPARISON:  Multiple exams, including 02/21/2018 FINDINGS: CT CHEST FINDINGS Cardiovascular: Prior CABG. Mediastinum/Nodes: A right paratracheal node with fatty hilum measures 0.9 cm in short axis on image 16/2, formerly same. No overtly pathologic thoracic adenopathy is identified. Lungs/Pleura: Nodular region of architectural distortion in the right upper lobe difficult to measure given the indistinct margins and general irregularity, but approximately 1.4 by 1.1 cm on image 60/4. By my measurements this lesion was previously 1.3 by 1.1 cm, and visually there is no subjective change. The patchy ground-glass opacities in the right lower lobe have essentially resolved. Musculoskeletal: Mild thoracic spondylosis. CT ABDOMEN PELVIS FINDINGS Hepatobiliary: Markedly improved appearance of the previous posterior right hepatic lobe tumor. There may be some slight capsular thickening in this vicinity up to about 6 mm but the masslike appearance shown on the prior exam which previously measured 2.1 by 2.9 cm is no longer present. The previously hypermetabolic lesion in segment 4 of the liver measures 1.4 by 1.2 cm on image 46/2, formerly 1.5 by 1.6 cm. No new liver lesion identified.  The gallbladder is absent. Pancreas: Unremarkable Spleen: Unremarkable Adrenals/Urinary Tract:  Unremarkable Stomach/Bowel: Sigmoid colon diverticulosis. Vascular/Lymphatic: Minimal atherosclerotic calcification of the aortic bifurcation. No pathologic adenopathy identified. Reproductive: Unremarkable Other: No supplemental non-categorized findings. Musculoskeletal: Intervertebral spurring at L5-S1. IMPRESSION: 1. Significantly improved appearance in the liver, with the mass posteriorly in the right hepatic lobe having almost resolved, with only minimal adjacent thickening of the capsule/diaphragm at the location of the prior posterior right hepatic lobe mass. In addition the previously hypermetabolic lesion segment 4 of the liver is mildly reduced in size. 2. The irregular right upper lobe nodule is roughly stable at 1.4 by 1.1 cm by my measurements. The patchy ground-glass opacities in the right lower lobe have resolved. 3. No new lesions are identified. 4. Sigmoid colon diverticulosis. Electronically Signed   By: WVan ClinesM.D.   On: 05/01/2018 14:32   Ct Abdomen Pelvis W Contrast  Result Date: 05/01/2018 CLINICAL DATA:  Restaging of metastatic non-small cell lung cancer of the right lung EXAM: CT CHEST, ABDOMEN, AND PELVIS WITH CONTRAST TECHNIQUE: Multidetector CT imaging of the chest, abdomen and pelvis was performed following the standard protocol during bolus administration of intravenous contrast. CONTRAST:  1015m  OMNIPAQUE IOHEXOL 300 MG/ML  SOLN COMPARISON:  Multiple exams, including 02/21/2018 FINDINGS: CT CHEST FINDINGS Cardiovascular: Prior CABG. Mediastinum/Nodes: A right paratracheal node with fatty hilum measures 0.9 cm in short axis on image 16/2, formerly same. No overtly pathologic thoracic adenopathy is identified. Lungs/Pleura: Nodular region of architectural distortion in the right upper lobe difficult to measure given the indistinct margins and general irregularity, but approximately 1.4 by 1.1 cm on image 60/4. By my measurements this lesion was previously 1.3 by 1.1 cm,  and visually there is no subjective change. The patchy ground-glass opacities in the right lower lobe have essentially resolved. Musculoskeletal: Mild thoracic spondylosis. CT ABDOMEN PELVIS FINDINGS Hepatobiliary: Markedly improved appearance of the previous posterior right hepatic lobe tumor. There may be some slight capsular thickening in this vicinity up to about 6 mm but the masslike appearance shown on the prior exam which previously measured 2.1 by 2.9 cm is no longer present. The previously hypermetabolic lesion in segment 4 of the liver measures 1.4 by 1.2 cm on image 46/2, formerly 1.5 by 1.6 cm. No new liver lesion identified.  The gallbladder is absent. Pancreas: Unremarkable Spleen: Unremarkable Adrenals/Urinary Tract: Unremarkable Stomach/Bowel: Sigmoid colon diverticulosis. Vascular/Lymphatic: Minimal atherosclerotic calcification of the aortic bifurcation. No pathologic adenopathy identified. Reproductive: Unremarkable Other: No supplemental non-categorized findings. Musculoskeletal: Intervertebral spurring at L5-S1. IMPRESSION: 1. Significantly improved appearance in the liver, with the mass posteriorly in the right hepatic lobe having almost resolved, with only minimal adjacent thickening of the capsule/diaphragm at the location of the prior posterior right hepatic lobe mass. In addition the previously hypermetabolic lesion segment 4 of the liver is mildly reduced in size. 2. The irregular right upper lobe nodule is roughly stable at 1.4 by 1.1 cm by my measurements. The patchy ground-glass opacities in the right lower lobe have resolved. 3. No new lesions are identified. 4. Sigmoid colon diverticulosis. Electronically Signed   By: Van Clines M.D.   On: 05/01/2018 14:32     ASSESSMENT/PLAN:  Stage 4 lung cancer, right ([facility]) This is a very pleasant 47 year old white male with stage IV non-small cell lung cancer, adenocarcinoma with resistant EGFR mutation in exon 20 and PDL 1  expression of 5%. The patient completed a course of palliative radiotherapy to the cervical spine as well as left hip under the care of Dr. Sondra Come.Marland Kitchen Hecompleted inductionsystemic chemotherapy with carboplatin, Alimta and Keytruda status post 4cycles. He has no evidence for disease progression after the induction phase of his treatment. The patient was started on maintenance treatment with Alimta and Keytruda (pembrolizumab) status post 6 cycles. He has been tolerating this treatment well with no concerning complaints except for mild nausea and constipation.  CBC from today has been reviewed and is adequate for treatment.  CMET is still pending.  Recommend for him to proceed with cycle #7 of maintenance Alimta and Keytruda today as scheduled. He will follow-up in 3 weeks for evaluation prior to cycle #8.  For constipation, I have discussed use of over-the-counter stool softeners and MiraLAX.  The patient was advised to call immediately if he has any concerning symptoms in the interval.  The patient voices understanding of current disease status and treatment options and is in agreement with the current care plan.  All questions were answered. The patient knows to call the clinic with any problems, questions or concerns. We can certainly see the patient much sooner if necessary.   No orders of the defined types were placed in this encounter.  Mikey Bussing, DNP, AGPCNP-BC, AOCNP 05/23/18

## 2018-05-23 NOTE — Assessment & Plan Note (Addendum)
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 The patient completed a course of palliative radiotherapy to the cervical spine as well as left hip under the care of Dr. Sondra Come.Marland Kitchen Hecompleted inductionsystemic chemotherapy with carboplatin, Alimta and Keytruda status post 4cycles. He has no evidence for disease progression after the induction phase of his treatment. The patient was started on maintenance treatment with Alimta and Keytruda (pembrolizumab) status post 6 cycles. He has been tolerating this treatment well with no concerning complaints except for mild nausea and constipation.  CBC from today has been reviewed and is adequate for treatment.  CMET is still pending.  Recommend for him to proceed with cycle #7 of maintenance Alimta and Keytruda today as scheduled. He will follow-up in 3 weeks for evaluation prior to cycle #8.  For constipation, I have discussed use of over-the-counter stool softeners and MiraLAX.  The patient was advised to call immediately if he has any concerning symptoms in the interval.  The patient voices understanding of current disease status and treatment options and is in agreement with the current care plan.  All questions were answered. The patient knows to call the clinic with any problems, questions or concerns. We can certainly see the patient much sooner if necessary.

## 2018-05-23 NOTE — Patient Instructions (Signed)
[facility] Discharge Instructions for Patients Receiving Chemotherapy  Today you received the following chemotherapy agents: Keytruda and Alimta.  To help prevent nausea and vomiting after your treatment, we encourage you to take your nausea medication as directed.   If you develop nausea and vomiting that is not controlled by your nausea medication, call the clinic.   BELOW ARE SYMPTOMS THAT SHOULD BE REPORTED IMMEDIATELY:  *FEVER GREATER THAN 100.5 F  *CHILLS WITH OR WITHOUT FEVER  NAUSEA AND VOMITING THAT IS NOT CONTROLLED WITH YOUR NAUSEA MEDICATION  *UNUSUAL SHORTNESS OF BREATH  *UNUSUAL BRUISING OR BLEEDING  TENDERNESS IN MOUTH AND THROAT WITH OR WITHOUT PRESENCE OF ULCERS  *URINARY PROBLEMS  *BOWEL PROBLEMS  UNUSUAL RASH Items with * indicate a potential emergency and should be followed up as soon as possible.  Feel free to call the clinic should you have any questions or concerns. The clinic phone number is [phone].  Please show the [facility] at check-in to the Emergency Department and triage nurse.

## 2018-05-24 ENCOUNTER — Telehealth: Payer: Self-pay | Admitting: Oncology

## 2018-05-24 NOTE — Telephone Encounter (Signed)
3 cycles already scheduled per 10/24 los . - no additional appts added.

## 2018-06-06 ENCOUNTER — Encounter: Payer: Self-pay | Admitting: Internal Medicine

## 2018-06-06 NOTE — Progress Notes (Signed)
Pt is approved for the $500 [place].

## 2018-06-07 ENCOUNTER — Encounter: Payer: Self-pay | Admitting: Pharmacy Technician

## 2018-06-07 ENCOUNTER — Encounter: Payer: Self-pay | Admitting: Medical Oncology

## 2018-06-07 NOTE — Progress Notes (Signed)
The patient is approved for drug assistance by [employer] for [employer]. Enrollment is effective until 06/08/19 and is based on self pay.  Drug replacement will begin for DOS 06/13/18.

## 2018-06-12 ENCOUNTER — Inpatient Hospital Stay: Payer: Medicaid Other | Attending: Internal Medicine

## 2018-06-12 ENCOUNTER — Inpatient Hospital Stay: Payer: Medicaid Other

## 2018-06-12 ENCOUNTER — Telehealth: Payer: Self-pay | Admitting: Internal Medicine

## 2018-06-12 ENCOUNTER — Encounter: Payer: Self-pay | Admitting: Internal Medicine

## 2018-06-12 ENCOUNTER — Inpatient Hospital Stay (HOSPITAL_BASED_OUTPATIENT_CLINIC_OR_DEPARTMENT_OTHER): Payer: Medicaid Other | Admitting: Internal Medicine

## 2018-06-12 VITALS — BP 117/89 | HR 93 | Temp 98.6°F | Resp 20 | Ht 70.0 in | Wt 213.6 lb

## 2018-06-12 DIAGNOSIS — Z5112 Encounter for antineoplastic immunotherapy: Secondary | ICD-10-CM

## 2018-06-12 DIAGNOSIS — Z7982 Long term (current) use of aspirin: Secondary | ICD-10-CM | POA: Insufficient documentation

## 2018-06-12 DIAGNOSIS — C778 Secondary and unspecified malignant neoplasm of lymph nodes of multiple regions: Secondary | ICD-10-CM

## 2018-06-12 DIAGNOSIS — I252 Old myocardial infarction: Secondary | ICD-10-CM | POA: Diagnosis not present

## 2018-06-12 DIAGNOSIS — C3491 Malignant neoplasm of unspecified part of right bronchus or lung: Secondary | ICD-10-CM

## 2018-06-12 DIAGNOSIS — C787 Secondary malignant neoplasm of liver and intrahepatic bile duct: Secondary | ICD-10-CM

## 2018-06-12 DIAGNOSIS — K219 Gastro-esophageal reflux disease without esophagitis: Secondary | ICD-10-CM

## 2018-06-12 DIAGNOSIS — Z923 Personal history of irradiation: Secondary | ICD-10-CM | POA: Insufficient documentation

## 2018-06-12 DIAGNOSIS — C3411 Malignant neoplasm of upper lobe, right bronchus or lung: Secondary | ICD-10-CM

## 2018-06-12 DIAGNOSIS — C7951 Secondary malignant neoplasm of bone: Secondary | ICD-10-CM

## 2018-06-12 DIAGNOSIS — C3490 Malignant neoplasm of unspecified part of unspecified bronchus or lung: Secondary | ICD-10-CM

## 2018-06-12 DIAGNOSIS — Z79899 Other long term (current) drug therapy: Secondary | ICD-10-CM | POA: Diagnosis not present

## 2018-06-12 DIAGNOSIS — Z5111 Encounter for antineoplastic chemotherapy: Secondary | ICD-10-CM | POA: Diagnosis not present

## 2018-06-12 LAB — CBC WITH DIFFERENTIAL (CANCER CENTER ONLY)
Abs Immature Granulocytes: 0.01 10*3/uL (ref 0.00–0.07)
BASOS PCT: 1 %
Basophils Absolute: 0 10*3/uL (ref 0.0–0.1)
EOS ABS: 0 10*3/uL (ref 0.0–0.5)
EOS PCT: 1 %
HCT: 36 % — ABNORMAL LOW (ref 39.0–52.0)
Hemoglobin: 12.1 g/dL — ABNORMAL LOW (ref 13.0–17.0)
Immature Granulocytes: 0 %
Lymphocytes Relative: 24 %
Lymphs Abs: 0.9 10*3/uL (ref 0.7–4.0)
MCH: 32 pg (ref 26.0–34.0)
MCHC: 33.6 g/dL (ref 30.0–36.0)
MCV: 95.2 fL (ref 80.0–100.0)
Monocytes Absolute: 0.4 10*3/uL (ref 0.1–1.0)
Monocytes Relative: 11 %
NRBC: 0 % (ref 0.0–0.2)
Neutro Abs: 2.3 10*3/uL (ref 1.7–7.7)
Neutrophils Relative %: 63 %
PLATELETS: 378 10*3/uL (ref 150–400)
RBC: 3.78 MIL/uL — AB (ref 4.22–5.81)
RDW: 15.6 % — AB (ref 11.5–15.5)
WBC: 3.7 10*3/uL — AB (ref 4.0–10.5)

## 2018-06-12 LAB — CMP (CANCER CENTER ONLY)
ALT: 54 U/L — AB (ref 0–44)
ANION GAP: 9 (ref 5–15)
AST: 36 U/L (ref 15–41)
Albumin: 3.5 g/dL (ref 3.5–5.0)
Alkaline Phosphatase: 96 U/L (ref 38–126)
BILIRUBIN TOTAL: 0.4 mg/dL (ref 0.3–1.2)
BUN: 10 mg/dL (ref 6–20)
CHLORIDE: 105 mmol/L (ref 98–111)
CO2: 28 mmol/L (ref 22–32)
Calcium: 10.2 mg/dL (ref 8.9–10.3)
Creatinine: 1.01 mg/dL (ref 0.61–1.24)
GFR, Est AFR Am: >60 mL/min (ref >60)
Glucose, Bld: 91 mg/dL (ref 70–99)
Potassium: 3.9 mmol/L (ref 3.5–5.1)
Sodium: 142 mmol/L (ref 135–145)
Total Protein: 7.4 g/dL (ref 6.5–8.1)

## 2018-06-12 LAB — TSH: TSH: 2.255 u[IU]/mL (ref 0.320–4.118)

## 2018-06-12 MED ORDER — SODIUM CHLORIDE 0.9 % IV SOLN
200.0000 mg | Freq: Once | INTRAVENOUS | Status: AC
  Administered 2018-06-12: 200 mg via INTRAVENOUS
  Filled 2018-06-12: qty 8

## 2018-06-12 MED ORDER — PROCHLORPERAZINE MALEATE 10 MG PO TABS: 10.0000 mg | ORAL_TABLET | Freq: Four times a day (QID) | ORAL | 1 refills | Status: DC | PRN

## 2018-06-12 MED ORDER — ONDANSETRON HCL 4 MG/2ML IJ SOLN
INTRAMUSCULAR | Status: AC
  Filled 2018-06-12: qty 4

## 2018-06-12 MED ORDER — SODIUM CHLORIDE 0.9 % IV SOLN
510.0000 mg/m2 | Freq: Once | INTRAVENOUS | Status: AC
  Administered 2018-06-12: 1100 mg via INTRAVENOUS
  Filled 2018-06-12: qty 40

## 2018-06-12 MED ORDER — SODIUM CHLORIDE 0.9 % IV SOLN
Freq: Once | INTRAVENOUS | Status: AC
  Administered 2018-06-12: 12:00:00 via INTRAVENOUS
  Filled 2018-06-12: qty 250

## 2018-06-12 MED ORDER — DEXAMETHASONE SODIUM PHOSPHATE 10 MG/ML IJ SOLN
INTRAMUSCULAR | Status: AC
  Filled 2018-06-12: qty 1

## 2018-06-12 MED ORDER — DEXAMETHASONE SODIUM PHOSPHATE 10 MG/ML IJ SOLN
10.0000 mg | Freq: Once | INTRAMUSCULAR | Status: AC
  Administered 2018-06-12: 10 mg via INTRAVENOUS

## 2018-06-12 MED ORDER — MIRTAZAPINE 30 MG PO TABS: 30.0000 mg | ORAL_TABLET | Freq: Every day | ORAL | 2 refills | Status: DC

## 2018-06-12 MED ORDER — ONDANSETRON HCL 4 MG/2ML IJ SOLN
8.0000 mg | Freq: Once | INTRAMUSCULAR | Status: AC
  Administered 2018-06-12: 8 mg via INTRAVENOUS

## 2018-06-12 MED FILL — PROCHLORPERAZINE 10 MG TAB: 10 | 7 days supply | Qty: 30 | Fill #0

## 2018-06-12 MED FILL — MIRTAZAPINE 30 MG TABLET: 30 | 30 days supply | Qty: 30 | Fill #0

## 2018-06-12 NOTE — Patient Instructions (Signed)
[facility] Discharge Instructions for Patients Receiving Chemotherapy  Today you received the following chemotherapy agents: Keytruda and Alimta.  To help prevent nausea and vomiting after your treatment, we encourage you to take your nausea medication as directed.   If you develop nausea and vomiting that is not controlled by your nausea medication, call the clinic.   BELOW ARE SYMPTOMS THAT SHOULD BE REPORTED IMMEDIATELY:  *FEVER GREATER THAN 100.5 F  *CHILLS WITH OR WITHOUT FEVER  NAUSEA AND VOMITING THAT IS NOT CONTROLLED WITH YOUR NAUSEA MEDICATION  *UNUSUAL SHORTNESS OF BREATH  *UNUSUAL BRUISING OR BLEEDING  TENDERNESS IN MOUTH AND THROAT WITH OR WITHOUT PRESENCE OF ULCERS  *URINARY PROBLEMS  *BOWEL PROBLEMS  UNUSUAL RASH Items with * indicate a potential emergency and should be followed up as soon as possible.  Feel free to call the clinic should you have any questions or concerns. The clinic phone number is [phone].  Please show the [facility] at check-in to the Emergency Department and triage nurse.

## 2018-06-12 NOTE — Progress Notes (Signed)
Patient came in to sign approval letter for one-time $500 [place]. He has a copy of the approval letter as well as expense sheet along with Outpatient pharmacy information. He has Lenise's card for any additional financial questions or concerns.

## 2018-06-12 NOTE — Telephone Encounter (Signed)
Scheduled appt per 11/12 los - pt to get an updated schedule in treatment area.

## 2018-06-12 NOTE — Progress Notes (Signed)
[place] [phone]   [phone]  OFFICE PROGRESS NOTE  [name], MD [address] [address]  DIAGNOSIS: Stage IVB(T1b, N2, M1c)non-small cell lung cancer, adenocarcinoma presented with right upper lobe lung nodule in addition to right hilar and mediastinal lymphadenopathy as well as metastatic liver and bone disease diagnosed in February 2019.  Biomarker Findings Microsatellite status - MS-Stable Tumor Mutational Burden - TMB-Low (4 Muts/Mb) Genomic Findings For a complete list of the genes assayed, please refer to the Appendix. EGFR exon 20 insertion ([id number]) RB1 loss exons 9-17 [id number] 7 Disease relevant genes with no reportable alterations: KRAS, ALK, BRAF, MET, RET, ERBB2, ROS1  PDL 1 expression 5%  PRIOR THERAPY:  1) Palliative radiotherapy to the metastatic bone lesions in the cervical spine and left femur under the care of Dr. Sondra Come. 2) Systemic chemotherapy with carboplatin for AUC of 5, Alimta 500 mg/M2 and Keytruda 200 mg IV every 3 weeks.  First dose October 04, 2017.  Status post 3 cycles. 3) palliative stereotactic radiotherapy to the progressive liver lesion.  CURRENT THERAPY: Maintenance treatment with Alimta 500 mg/M2 and Keytruda 200 mg IV every 3 weeks.  Status post 5 cycles.  INTERVAL HISTORY: Anthony [name] 47 y.o. male returns to the clinic today for follow-up visit.  The patient is feeling fine today with no concerning complaints.  He denied having any chest pain, shortness breath, cough or hemoptysis.  He denied having any fever or chills.  He has no nausea, vomiting, diarrhea or constipation.  The patient denied having any significant weight loss or night sweats.  The patient denied having any headache or visual changes.  He is here today for evaluation before starting cycle #6 of his maintenance treatment with Alimta and Ketruda (pembrolizumab).   MEDICAL  HISTORY: Past Medical History:  Diagnosis Date  . Acid reflux   . History of radiation therapy 09/21/17-10/04/17   C4 spine 30 Gy in 10 fractions, left femur 30 Gy in 10 fractions  . Non-small cell lung cancer ([facility])   . NSTEMI (non-ST elevated myocardial infarction) ([facility]) 06/13/2017   [name] 06/14/2017  . Tailbone injury since 1993   "cracked"    ALLERGIES:  has No Known Allergies.  MEDICATIONS:  Current Outpatient Medications  Medication Sig Dispense Refill  . acetaminophen (TYLENOL) 500 MG tablet Take 500-1,000 mg by mouth daily as needed for moderate pain or headache.    Marland Kitchen aspirin EC 81 MG EC tablet Take 1 tablet (81 mg total) by mouth daily.    Marland Kitchen atorvastatin (LIPITOR) 40 MG tablet Take 1 tablet (40 mg total) by mouth daily. 90 tablet 3  . cetirizine (ZYRTEC) 10 MG tablet Take 10 mg by mouth daily as needed for allergies.     . chlorproMAZINE (THORAZINE) 25 MG tablet Take 1 tablet (25 mg total) by mouth 3 (three) times daily as needed. 30 tablet 0  . esomeprazole (NEXIUM) 20 MG capsule Take 20 mg by mouth every other day.    . folic acid (FOLVITE) 1 MG tablet Take 1 tablet (1 mg total) by mouth daily. 30 tablet 2  . lisinopril (PRINIVIL,ZESTRIL) 5 MG tablet TAKE 1 TABLET BY MOUTH EVERY DAY 90 tablet 3  . metoprolol tartrate (LOPRESSOR) 25 MG tablet Take 12.5 mg by mouth 2 (two) times daily.    . mirtazapine (REMERON) 30 MG tablet Take 1 tablet (30 mg total) by mouth at bedtime. 30 tablet 2  .  prochlorperazine (COMPAZINE) 10 MG tablet Take 1 tablet (10 mg total) by mouth every 6 (six) hours as needed for nausea or vomiting. 30 tablet 1   No current facility-administered medications for this visit.     SURGICAL HISTORY:  Past Surgical History:  Procedure Laterality Date  . CARDIAC CATHETERIZATION  06/14/2017  . CHOLECYSTECTOMY N/A 12/29/2017   Procedure: LAPAROSCOPIC CHOLECYSTECTOMY;  Surgeon: Ralene Ok, MD;  Location: [facility];  Service: General;  Laterality: N/A;  .  CHOLECYSTECTOMY, LAPAROSCOPIC N/A 12/29/2017  . CORONARY ARTERY BYPASS GRAFT N/A 06/19/2017   Procedure: CORONARY ARTERY BYPASS GRAFTING (CABG), OFF PUMP, times one using the left internal mammary artery to LAD;  Surgeon: Grace Isaac, MD;  Location: [facility];  Service: Open Heart Surgery;  Laterality: N/A;  . ESOPHAGOGASTRODUODENOSCOPY (EGD) WITH PROPOFOL N/A 11/30/2017   Procedure: ESOPHAGOGASTRODUODENOSCOPY (EGD) WITH PROPOFOL;  Surgeon: Gatha Mayer, MD;  Location: [facility];  Service: Endoscopy;  Laterality: N/A;  . LEFT HEART CATH AND CORONARY ANGIOGRAPHY N/A 06/14/2017   Procedure: LEFT HEART CATH AND CORONARY ANGIOGRAPHY;  Surgeon: Sherren Mocha, MD;  Location: [facility];  Service: Cardiovascular;  Laterality: N/A;  . TEE WITHOUT CARDIOVERSION N/A 06/19/2017   Procedure: TRANSESOPHAGEAL ECHOCARDIOGRAM (TEE);  Surgeon: Grace Isaac, MD;  Location: [facility];  Service: Open Heart Surgery;  Laterality: N/A;  . TONSILLECTOMY  ~ 1977    REVIEW OF SYSTEMS:  A comprehensive review of systems was negative except for: Constitutional: positive for fatigue   PHYSICAL EXAMINATION: General appearance: alert, cooperative, fatigued and no distress Head: Normocephalic, without obvious abnormality, atraumatic Neck: no adenopathy, no JVD, supple, symmetrical, trachea midline and thyroid not enlarged, symmetric, no tenderness/mass/nodules Lymph nodes: Cervical, supraclavicular, and axillary nodes normal. Resp: clear to auscultation bilaterally Back: symmetric, no curvature. ROM normal. No CVA tenderness. Cardio: regular rate and rhythm, S1, S2 normal, no murmur, click, rub or gallop GI: soft, non-tender; bowel sounds normal; no masses,  no organomegaly Extremities: extremities normal, atraumatic, no cyanosis or edema  ECOG PERFORMANCE STATUS: 1 - Symptomatic but completely ambulatory  Blood pressure 117/89, pulse 93, temperature 98.6 F (37 C), temperature source Oral, resp. rate  20, height '5\' 10"'$  (1.778 m), weight 213 lb 9.6 oz (96.9 kg), SpO2 98 %.  LABORATORY DATA: Lab Results  Component Value Date   WBC 3.7 (L) 06/12/2018   HGB 12.1 (L) 06/12/2018   HCT 36.0 (L) 06/12/2018   MCV 95.2 06/12/2018   PLT 378 06/12/2018      Chemistry      Component Value Date/Time   NA 144 05/23/2018 0810   NA 141 07/17/2017 1006   K 4.0 05/23/2018 0810   CL 107 05/23/2018 0810   CO2 24 05/23/2018 0810   BUN 8 05/23/2018 0810   BUN 12 07/17/2017 1006   CREATININE 1.00 05/23/2018 0810      Component Value Date/Time   CALCIUM 10.0 05/23/2018 0810   ALKPHOS 95 05/23/2018 0810   AST 30 05/23/2018 0810   ALT 36 05/23/2018 0810   BILITOT 0.7 05/23/2018 0810       RADIOGRAPHIC STUDIES: No results found.  ASSESSMENT AND PLAN: This is a very pleasant 47 years old white male with stage IV non-small cell lung cancer, adenocarcinoma with resistant EGFR mutation in exon 20 and PDL 1 expression of 5%. The patient completed a course of palliative radiotherapy to the cervical spine as well as left hip under the care of Dr. Sondra Come.Marland Kitchen He completed induction systemic chemotherapy  with carboplatin, Alimta and Keytruda status post 4 cycles.  He has no evidence for disease progression after the induction phase of his treatment. The patient was started on maintenance treatment with Alimta and Ketruda (pembrolizumab) status post 5 cycles.   The patient is tolerating his treatment with maintenance therapy fairly well. I recommended for him to proceed with cycle #6 today as scheduled. I will see him back for follow-up visit in 3 weeks for evaluation with repeat CT scan of the chest, abdomen and pelvis for restaging of his disease. I sent a refill for Compazine and Remeron to his pharmacy. The patient voices understanding of current disease status and treatment options and is in agreement with the current care plan.  All questions were answered. The patient knows to call the clinic with  any problems, questions or concerns. We can certainly see the patient much sooner if necessary.  Disclaimer: This note was dictated with voice recognition software. Similar sounding words can inadvertently be transcribed and may not be corrected upon review.

## 2018-06-14 DIAGNOSIS — Z736 Limitation of activities due to disability: Secondary | ICD-10-CM

## 2018-07-02 ENCOUNTER — Ambulatory Visit (HOSPITAL_COMMUNITY)
Admission: RE | Admit: 2018-07-02 | Discharge: 2018-07-02 | Disposition: A | Discharge status: Home / Self Care | Payer: Medicaid Other | Source: Ambulatory Visit | Attending: Internal Medicine | Admitting: Internal Medicine

## 2018-07-02 ENCOUNTER — Encounter (HOSPITAL_COMMUNITY): Payer: Self-pay

## 2018-07-02 DIAGNOSIS — C3490 Malignant neoplasm of unspecified part of unspecified bronchus or lung: Secondary | ICD-10-CM

## 2018-07-02 MED ORDER — SODIUM CHLORIDE (PF) 0.9 % IJ SOLN
INTRAMUSCULAR | Status: AC
  Filled 2018-07-02: qty 50

## 2018-07-02 MED ORDER — IOHEXOL 300 MG/ML  SOLN
100.0000 mL | Freq: Once | INTRAMUSCULAR | Status: AC | PRN
  Administered 2018-07-02: 100 mL via INTRAVENOUS

## 2018-07-04 ENCOUNTER — Inpatient Hospital Stay: Payer: Medicaid Other

## 2018-07-04 ENCOUNTER — Telehealth: Payer: Self-pay | Admitting: Internal Medicine

## 2018-07-04 ENCOUNTER — Encounter: Payer: Self-pay | Admitting: Internal Medicine

## 2018-07-04 ENCOUNTER — Inpatient Hospital Stay: Payer: Medicaid Other | Attending: Internal Medicine

## 2018-07-04 ENCOUNTER — Other Ambulatory Visit: Payer: Self-pay

## 2018-07-04 ENCOUNTER — Inpatient Hospital Stay (HOSPITAL_BASED_OUTPATIENT_CLINIC_OR_DEPARTMENT_OTHER): Payer: Medicaid Other | Admitting: Internal Medicine

## 2018-07-04 VITALS — BP 129/97 | HR 103 | Temp 97.1°F | Resp 18 | Ht 70.0 in | Wt 214.8 lb

## 2018-07-04 DIAGNOSIS — Z5111 Encounter for antineoplastic chemotherapy: Secondary | ICD-10-CM | POA: Diagnosis not present

## 2018-07-04 DIAGNOSIS — Z5112 Encounter for antineoplastic immunotherapy: Secondary | ICD-10-CM | POA: Insufficient documentation

## 2018-07-04 DIAGNOSIS — Z79899 Other long term (current) drug therapy: Secondary | ICD-10-CM | POA: Insufficient documentation

## 2018-07-04 DIAGNOSIS — Z923 Personal history of irradiation: Secondary | ICD-10-CM

## 2018-07-04 DIAGNOSIS — I2510 Atherosclerotic heart disease of native coronary artery without angina pectoris: Secondary | ICD-10-CM | POA: Diagnosis not present

## 2018-07-04 DIAGNOSIS — C7951 Secondary malignant neoplasm of bone: Secondary | ICD-10-CM

## 2018-07-04 DIAGNOSIS — C3491 Malignant neoplasm of unspecified part of right bronchus or lung: Secondary | ICD-10-CM

## 2018-07-04 DIAGNOSIS — J90 Pleural effusion, not elsewhere classified: Secondary | ICD-10-CM | POA: Diagnosis not present

## 2018-07-04 DIAGNOSIS — K219 Gastro-esophageal reflux disease without esophagitis: Secondary | ICD-10-CM | POA: Diagnosis not present

## 2018-07-04 DIAGNOSIS — C787 Secondary malignant neoplasm of liver and intrahepatic bile duct: Secondary | ICD-10-CM | POA: Diagnosis not present

## 2018-07-04 DIAGNOSIS — C3411 Malignant neoplasm of upper lobe, right bronchus or lung: Secondary | ICD-10-CM | POA: Insufficient documentation

## 2018-07-04 DIAGNOSIS — I252 Old myocardial infarction: Secondary | ICD-10-CM | POA: Diagnosis not present

## 2018-07-04 DIAGNOSIS — Z7982 Long term (current) use of aspirin: Secondary | ICD-10-CM | POA: Diagnosis not present

## 2018-07-04 DIAGNOSIS — K5730 Diverticulosis of large intestine without perforation or abscess without bleeding: Secondary | ICD-10-CM

## 2018-07-04 DIAGNOSIS — R5383 Other fatigue: Secondary | ICD-10-CM | POA: Insufficient documentation

## 2018-07-04 LAB — CMP (CANCER CENTER ONLY)
ALT: 39 U/L (ref 0–44)
AST: 29 U/L (ref 15–41)
Albumin: 3.4 g/dL — ABNORMAL LOW (ref 3.5–5.0)
Alkaline Phosphatase: 89 U/L (ref 38–126)
Anion gap: 11 (ref 5–15)
BILIRUBIN TOTAL: 0.4 mg/dL (ref 0.3–1.2)
BUN: 9 mg/dL (ref 6–20)
CO2: 23 mmol/L (ref 22–32)
Calcium: 9.8 mg/dL (ref 8.9–10.3)
Chloride: 108 mmol/L (ref 98–111)
Creatinine: 1.1 mg/dL (ref 0.61–1.24)
GFR, Est AFR Am: >60 mL/min (ref >60)
GFR, Estimated: >60 mL/min (ref >60)
Glucose, Bld: 116 mg/dL — ABNORMAL HIGH (ref 70–99)
Potassium: 4 mmol/L (ref 3.5–5.1)
Sodium: 142 mmol/L (ref 135–145)
Total Protein: 7.1 g/dL (ref 6.5–8.1)

## 2018-07-04 LAB — CBC WITH DIFFERENTIAL (CANCER CENTER ONLY)
Abs Immature Granulocytes: 0.02 10*3/uL (ref 0.00–0.07)
BASOS ABS: 0 10*3/uL (ref 0.0–0.1)
Basophils Relative: 1 %
EOS ABS: 0 10*3/uL (ref 0.0–0.5)
Eosinophils Relative: 1 %
HCT: 35.5 % — ABNORMAL LOW (ref 39.0–52.0)
Hemoglobin: 11.8 g/dL — ABNORMAL LOW (ref 13.0–17.0)
Immature Granulocytes: 1 %
Lymphocytes Relative: 23 %
Lymphs Abs: 0.9 10*3/uL (ref 0.7–4.0)
MCH: 31.6 pg (ref 26.0–34.0)
MCHC: 33.2 g/dL (ref 30.0–36.0)
MCV: 94.9 fL (ref 80.0–100.0)
Monocytes Absolute: 0.5 10*3/uL (ref 0.1–1.0)
Monocytes Relative: 12 %
Neutro Abs: 2.6 10*3/uL (ref 1.7–7.7)
Neutrophils Relative %: 62 %
PLATELETS: 339 10*3/uL (ref 150–400)
RBC: 3.74 MIL/uL — ABNORMAL LOW (ref 4.22–5.81)
RDW: 15.3 % (ref 11.5–15.5)
WBC Count: 4 10*3/uL (ref 4.0–10.5)
nRBC: 0 % (ref 0.0–0.2)

## 2018-07-04 LAB — TSH: TSH: 0.936 u[IU]/mL (ref 0.320–4.118)

## 2018-07-04 MED ORDER — ONDANSETRON HCL 4 MG/2ML IJ SOLN
INTRAMUSCULAR | Status: AC
  Filled 2018-07-04: qty 4

## 2018-07-04 MED ORDER — CYANOCOBALAMIN 1000 MCG/ML IJ SOLN
INTRAMUSCULAR | Status: AC
  Filled 2018-07-04: qty 1

## 2018-07-04 MED ORDER — ONDANSETRON HCL 4 MG/2ML IJ SOLN
8.0000 mg | Freq: Once | INTRAMUSCULAR | Status: AC
  Administered 2018-07-04: 8 mg via INTRAVENOUS

## 2018-07-04 MED ORDER — DEXAMETHASONE SODIUM PHOSPHATE 10 MG/ML IJ SOLN
INTRAMUSCULAR | Status: AC
  Filled 2018-07-04: qty 1

## 2018-07-04 MED ORDER — CYANOCOBALAMIN 1000 MCG/ML IJ SOLN
1000.0000 ug | Freq: Once | INTRAMUSCULAR | Status: AC
  Administered 2018-07-04: 1000 ug via INTRAMUSCULAR

## 2018-07-04 MED ORDER — DEXAMETHASONE SODIUM PHOSPHATE 10 MG/ML IJ SOLN
10.0000 mg | Freq: Once | INTRAMUSCULAR | Status: AC
  Administered 2018-07-04: 10 mg via INTRAVENOUS

## 2018-07-04 MED ORDER — SODIUM CHLORIDE 0.9 % IV SOLN
Freq: Once | INTRAVENOUS | Status: AC
  Administered 2018-07-04: 11:00:00 via INTRAVENOUS
  Filled 2018-07-04: qty 250

## 2018-07-04 MED ORDER — SODIUM CHLORIDE 0.9 % IV SOLN
510.0000 mg/m2 | Freq: Once | INTRAVENOUS | Status: AC
  Administered 2018-07-04: 1100 mg via INTRAVENOUS
  Filled 2018-07-04: qty 40

## 2018-07-04 MED ORDER — SODIUM CHLORIDE 0.9 % IV SOLN
200.0000 mg | Freq: Once | INTRAVENOUS | Status: AC
  Administered 2018-07-04: 200 mg via INTRAVENOUS
  Filled 2018-07-04: qty 8

## 2018-07-04 NOTE — Patient Instructions (Signed)
[facility] Discharge Instructions for Patients Receiving Chemotherapy  Today you received the following chemotherapy agents Keytruda and Alimta   To help prevent nausea and vomiting after your treatment, we encourage you to take your nausea medication as directed.    If you develop nausea and vomiting that is not controlled by your nausea medication, call the clinic.   BELOW ARE SYMPTOMS THAT SHOULD BE REPORTED IMMEDIATELY:  *FEVER GREATER THAN 100.5 F  *CHILLS WITH OR WITHOUT FEVER  NAUSEA AND VOMITING THAT IS NOT CONTROLLED WITH YOUR NAUSEA MEDICATION  *UNUSUAL SHORTNESS OF BREATH  *UNUSUAL BRUISING OR BLEEDING  TENDERNESS IN MOUTH AND THROAT WITH OR WITHOUT PRESENCE OF ULCERS  *URINARY PROBLEMS  *BOWEL PROBLEMS  UNUSUAL RASH Items with * indicate a potential emergency and should be followed up as soon as possible.  Feel free to call the clinic should you have any questions or concerns. The clinic phone number is [phone].  Please show the [facility] at check-in to the Emergency Department and triage nurse.

## 2018-07-04 NOTE — Progress Notes (Signed)
[facility] [phone]   [phone]  OFFICE PROGRESS NOTE  [name], MD [address] [place] [address]  DIAGNOSIS: Stage IVB(T1b, N2, M1c)non-small cell lung cancer, adenocarcinoma presented with right upper lobe lung nodule in addition to right hilar and mediastinal lymphadenopathy as well as metastatic liver and bone disease diagnosed in February 2019.  Biomarker Findings Microsatellite status - MS-Stable Tumor Mutational Burden - TMB-Low (4 Muts/Mb) Genomic Findings For a complete list of the genes assayed, please refer to the Appendix. EGFR exon 20 insertion ([id number]) RB1 loss exons 9-17 TP53 [id number] 7 Disease relevant genes with no reportable alterations: KRAS, ALK, BRAF, MET, RET, ERBB2, ROS1  PDL 1 expression 5%  PRIOR THERAPY:  1) Palliative radiotherapy to the metastatic bone lesions in the cervical spine and left femur under the care of Dr. Sondra Come. 2) Systemic chemotherapy with carboplatin for AUC of 5, Alimta 500 mg/M2 and Keytruda 200 mg IV every 3 weeks.  First dose October 04, 2017.  Status post 3 cycles. 3) palliative stereotactic radiotherapy to the progressive liver lesion.  CURRENT THERAPY: Maintenance treatment with Alimta 500 mg/M2 and Keytruda 200 mg IV every 3 weeks.  Status post 9 cycles.  INTERVAL HISTORY: Anthony [name] 47 y.o. male returns to the clinic today for follow-up visit.  The patient is feeling fine today with no concerning complaints except for persistent fatigue.  He denied having any chest pain, shortness of breath, cough or hemoptysis.  He denied having any weight loss or night sweats.  He has no nausea, vomiting, diarrhea or constipation.  He denied having any headache or visual changes.  He has been tolerating his treatment with maintenance Alimta and Keytruda fairly well.  The patient had repeat CT scan of the chest, abdomen and pelvis performed recently and he is here  for evaluation and discussion of his scan results.  MEDICAL HISTORY: Past Medical History:  Diagnosis Date  . Acid reflux   . History of radiation therapy 09/21/17-10/04/17   C4 spine 30 Gy in 10 fractions, left femur 30 Gy in 10 fractions  . Non-small cell lung cancer ([facility])   . NSTEMI (non-ST elevated myocardial infarction) ([facility]) 06/13/2017   [name] 06/14/2017  . Tailbone injury since 1993   "cracked"    ALLERGIES:  has No Known Allergies.  MEDICATIONS:  Current Outpatient Medications  Medication Sig Dispense Refill  . acetaminophen (TYLENOL) 500 MG tablet Take 500-1,000 mg by mouth daily as needed for moderate pain or headache.    Marland Kitchen aspirin EC 81 MG EC tablet Take 1 tablet (81 mg total) by mouth daily.    Marland Kitchen atorvastatin (LIPITOR) 40 MG tablet Take 1 tablet (40 mg total) by mouth daily. 90 tablet 3  . cetirizine (ZYRTEC) 10 MG tablet Take 10 mg by mouth daily as needed for allergies.     . chlorproMAZINE (THORAZINE) 25 MG tablet Take 1 tablet (25 mg total) by mouth 3 (three) times daily as needed. 30 tablet 0  . esomeprazole (NEXIUM) 20 MG capsule Take 20 mg by mouth every other day.    . folic acid (FOLVITE) 1 MG tablet Take 1 tablet (1 mg total) by mouth daily. 30 tablet 2  . lisinopril (PRINIVIL,ZESTRIL) 5 MG tablet TAKE 1 TABLET BY MOUTH EVERY DAY 90 tablet 3  . metoprolol tartrate (LOPRESSOR) 25 MG tablet Take 12.5 mg by mouth 2 (two) times daily.    . mirtazapine (REMERON) 30 MG  tablet Take 1 tablet (30 mg total) by mouth at bedtime. 30 tablet 2  . prochlorperazine (COMPAZINE) 10 MG tablet Take 1 tablet (10 mg total) by mouth every 6 (six) hours as needed for nausea or vomiting. 30 tablet 1   No current facility-administered medications for this visit.     SURGICAL HISTORY:  Past Surgical History:  Procedure Laterality Date  . CARDIAC CATHETERIZATION  06/14/2017  . CHOLECYSTECTOMY N/A 12/29/2017   Procedure: LAPAROSCOPIC CHOLECYSTECTOMY;  Surgeon: Ralene Ok, MD;   Location: [facility];  Service: General;  Laterality: N/A;  . CHOLECYSTECTOMY, LAPAROSCOPIC N/A 12/29/2017  . CORONARY ARTERY BYPASS GRAFT N/A 06/19/2017   Procedure: CORONARY ARTERY BYPASS GRAFTING (CABG), OFF PUMP, times one using the left internal mammary artery to LAD;  Surgeon: Grace Isaac, MD;  Location: [facility];  Service: Open Heart Surgery;  Laterality: N/A;  . ESOPHAGOGASTRODUODENOSCOPY (EGD) WITH PROPOFOL N/A 11/30/2017   Procedure: ESOPHAGOGASTRODUODENOSCOPY (EGD) WITH PROPOFOL;  Surgeon: Gatha Mayer, MD;  Location: [facility];  Service: Endoscopy;  Laterality: N/A;  . LEFT HEART CATH AND CORONARY ANGIOGRAPHY N/A 06/14/2017   Procedure: LEFT HEART CATH AND CORONARY ANGIOGRAPHY;  Surgeon: Sherren Mocha, MD;  Location: [facility];  Service: Cardiovascular;  Laterality: N/A;  . TEE WITHOUT CARDIOVERSION N/A 06/19/2017   Procedure: TRANSESOPHAGEAL ECHOCARDIOGRAM (TEE);  Surgeon: Grace Isaac, MD;  Location: [facility];  Service: Open Heart Surgery;  Laterality: N/A;  . TONSILLECTOMY  ~ 1977    REVIEW OF SYSTEMS:  Constitutional: positive for fatigue Eyes: negative Ears, nose, mouth, throat, and face: negative Respiratory: negative Cardiovascular: negative Gastrointestinal: negative Genitourinary:negative Integument/breast: negative Hematologic/lymphatic: negative Musculoskeletal:negative Neurological: negative Behavioral/Psych: negative Endocrine: negative Allergic/Immunologic: negative   PHYSICAL EXAMINATION: General appearance: alert, cooperative, fatigued and no distress Head: Normocephalic, without obvious abnormality, atraumatic Neck: no adenopathy, no JVD, supple, symmetrical, trachea midline and thyroid not enlarged, symmetric, no tenderness/mass/nodules Lymph nodes: Cervical, supraclavicular, and axillary nodes normal. Resp: clear to auscultation bilaterally Back: symmetric, no curvature. ROM normal. No CVA tenderness. Cardio: regular rate and rhythm,  S1, S2 normal, no murmur, click, rub or gallop GI: soft, non-tender; bowel sounds normal; no masses,  no organomegaly Extremities: extremities normal, atraumatic, no cyanosis or edema Neurologic: Alert and oriented X 3, normal strength and tone. Normal symmetric reflexes. Normal coordination and gait  ECOG PERFORMANCE STATUS: 1 - Symptomatic but completely ambulatory  Blood pressure (!) 129/97, pulse (!) 103, temperature (!) 97.1 F (36.2 C), temperature source Oral, resp. rate 18, height '5\' 10"'$  (1.778 m), weight 214 lb 12.8 oz (97.4 kg), SpO2 100 %.  LABORATORY DATA: Lab Results  Component Value Date   WBC 4.0 07/04/2018   HGB 11.8 (L) 07/04/2018   HCT 35.5 (L) 07/04/2018   MCV 94.9 07/04/2018   PLT 339 07/04/2018      Chemistry      Component Value Date/Time   NA 142 06/12/2018 1046   NA 141 07/17/2017 1006   K 3.9 06/12/2018 1046   CL 105 06/12/2018 1046   CO2 28 06/12/2018 1046   BUN 10 06/12/2018 1046   BUN 12 07/17/2017 1006   CREATININE 1.01 06/12/2018 1046      Component Value Date/Time   CALCIUM 10.2 06/12/2018 1046   ALKPHOS 96 06/12/2018 1046   AST 36 06/12/2018 1046   ALT 54 (H) 06/12/2018 1046   BILITOT 0.4 06/12/2018 1046       RADIOGRAPHIC STUDIES: Ct Chest W Contrast  Result Date: 07/02/2018 CLINICAL DATA:  Non-small cell right upper lobe lung cancer diagnosed in 2019, metastatic to bone and liver, chemotherapy in progress. Radiation therapy completed. Shortness of breath on exertion, constipation. EXAM: CT CHEST, ABDOMEN, AND PELVIS WITH CONTRAST TECHNIQUE: Multidetector CT imaging of the chest, abdomen and pelvis was performed following the standard protocol during bolus administration of intravenous contrast. CONTRAST:  177m OMNIPAQUE IOHEXOL 300 MG/ML  SOLN COMPARISON:  Multiple exams, including 05/01/2018 FINDINGS: CT CHEST FINDINGS Cardiovascular: Prior CABG.  Coronary atherosclerosis. Mediastinum/Nodes: 10 mm right upper paratracheal lymph node  (formerly measured at 9 mm) with fatty hilum. 1.0 cm right hilar lymph node, formerly 0.7 cm. Lungs/Pleura: Residual nodularity at the site of the original right upper lobe lesion, currently 1.3 by 1.1 cm on image 63/7, previously 1.4 by 1.1 cm. Morphologically unchanged. There is some indistinctly marginated patchy airspace opacity in the posterior basal segment right lower lobe on images 113-122 of series 7. In addition, there is abnormal new masslike nodularity along the right diaphragmatic pleura measuring up to 8 mm in thickness and with the most confluent area of nodularity about 5.0 cm transverse, as well as with additional nodularity, highly concerning for pleural metastatic disease. Trace right pleural effusion. Minimal stable nodularity along the minor fissure. Musculoskeletal: Mild lower thoracic spondylosis. CT ABDOMEN PELVIS FINDINGS Hepatobiliary: The segment 4 lesion remains relatively indistinct and measures proximally 1.4 by 1.0 cm on image 50/2, formerly 1.4 by 1.2 cm. The small likely benign lesion in segment 6 of the liver shown on the prior hepatic MRI from 09/04/2017 is not readily apparent by CT. No new liver lesions are observed. The gallbladder is absent. Pancreas: Unremarkable Spleen: Unremarkable Adrenals/Urinary Tract: Unremarkable Stomach/Bowel: Mild sigmoid colon diverticulosis. Vascular/Lymphatic: Mild abdominal aortic atherosclerotic vascular disease. Reproductive: Unremarkable Other: No supplemental non-categorized findings. Musculoskeletal: Intervertebral spurring at L5-S1. IMPRESSION: 1. New irregular multifocal nodularity along the right diaphragm/pleural surface compatible with pleural metastatic disease. Trace right pleural effusion. 2. Indistinct posterior basal segment right lower lobe airspace disease, low-grade pneumonia not excluded. 3. The original right upper lobe lesion measures 1.3 by 1.1 cm, previously 1.4 by 1.1 cm. 4. The segment 4 lesion in the liver measures 1.4 by  1.0 cm, formerly 1.4 by 1.2 cm. 5. Other imaging findings of potential clinical significance: Mild sigmoid colon diverticulosis. Aortic Atherosclerosis (ICD10-I70.0). Prior CABG with coronary atherosclerosis. Electronically Signed   By: WVan ClinesM.D.   On: 07/02/2018 09:56   Ct Abdomen Pelvis W Contrast  Result Date: 07/02/2018 CLINICAL DATA:  Non-small cell right upper lobe lung cancer diagnosed in 2019, metastatic to bone and liver, chemotherapy in progress. Radiation therapy completed. Shortness of breath on exertion, constipation. EXAM: CT CHEST, ABDOMEN, AND PELVIS WITH CONTRAST TECHNIQUE: Multidetector CT imaging of the chest, abdomen and pelvis was performed following the standard protocol during bolus administration of intravenous contrast. CONTRAST:  1056mOMNIPAQUE IOHEXOL 300 MG/ML  SOLN COMPARISON:  Multiple exams, including 05/01/2018 FINDINGS: CT CHEST FINDINGS Cardiovascular: Prior CABG.  Coronary atherosclerosis. Mediastinum/Nodes: 10 mm right upper paratracheal lymph node (formerly measured at 9 mm) with fatty hilum. 1.0 cm right hilar lymph node, formerly 0.7 cm. Lungs/Pleura: Residual nodularity at the site of the original right upper lobe lesion, currently 1.3 by 1.1 cm on image 63/7, previously 1.4 by 1.1 cm. Morphologically unchanged. There is some indistinctly marginated patchy airspace opacity in the posterior basal segment right lower lobe on images 113-122 of series 7. In addition, there is abnormal new masslike nodularity along the right diaphragmatic pleura measuring  up to 8 mm in thickness and with the most confluent area of nodularity about 5.0 cm transverse, as well as with additional nodularity, highly concerning for pleural metastatic disease. Trace right pleural effusion. Minimal stable nodularity along the minor fissure. Musculoskeletal: Mild lower thoracic spondylosis. CT ABDOMEN PELVIS FINDINGS Hepatobiliary: The segment 4 lesion remains relatively indistinct and  measures proximally 1.4 by 1.0 cm on image 50/2, formerly 1.4 by 1.2 cm. The small likely benign lesion in segment 6 of the liver shown on the prior hepatic MRI from 09/04/2017 is not readily apparent by CT. No new liver lesions are observed. The gallbladder is absent. Pancreas: Unremarkable Spleen: Unremarkable Adrenals/Urinary Tract: Unremarkable Stomach/Bowel: Mild sigmoid colon diverticulosis. Vascular/Lymphatic: Mild abdominal aortic atherosclerotic vascular disease. Reproductive: Unremarkable Other: No supplemental non-categorized findings. Musculoskeletal: Intervertebral spurring at L5-S1. IMPRESSION: 1. New irregular multifocal nodularity along the right diaphragm/pleural surface compatible with pleural metastatic disease. Trace right pleural effusion. 2. Indistinct posterior basal segment right lower lobe airspace disease, low-grade pneumonia not excluded. 3. The original right upper lobe lesion measures 1.3 by 1.1 cm, previously 1.4 by 1.1 cm. 4. The segment 4 lesion in the liver measures 1.4 by 1.0 cm, formerly 1.4 by 1.2 cm. 5. Other imaging findings of potential clinical significance: Mild sigmoid colon diverticulosis. Aortic Atherosclerosis (ICD10-I70.0). Prior CABG with coronary atherosclerosis. Electronically Signed   By: Van Clines M.D.   On: 07/02/2018 09:56    ASSESSMENT AND PLAN: This is a very pleasant 47 years old white male with stage IV non-small cell lung cancer, adenocarcinoma with resistant EGFR mutation in exon 20 and PDL 1 expression of 5%. The patient completed a course of palliative radiotherapy to the cervical spine as well as left hip under the care of Dr. Sondra Come.Marland Kitchen He completed induction systemic chemotherapy with carboplatin, Alimta and Keytruda status post 4 cycles.  He has no evidence for disease progression after the induction phase of his treatment. The patient was started on maintenance treatment with Alimta and Ketruda (pembrolizumab) status post 8 cycles.   The  patient is tolerating his treatment fairly well with no concerning complaints except for fatigue. He had repeat CT scan of the chest, abdomen and pelvis performed recently I personally and independently reviewed the scan images and discussed the results with the patient today.  His scan showed stable disease except for suspicious multifocal pleural nodularities concerning for pleural metastatic disease. I discussed with the patient his treatment options including continuing his current maintenance therapy with Alimta and Keytruda and close monitoring of the pleural-based nodules.  The patient is currently asymptomatic.  Other option would be changing his systemic treatment to a different regimen. The patient agreed with the continuation of his maintenance treatment for now.  He will monitor for any symptoms especially increasing shortness of breath or right-sided chest pain. He will proceed with cycle #9 today of his maintenance treatment. I will see him back for follow-up visit in 3 weeks for evaluation before the next cycle of his treatment. The patient was advised to call immediately if he has any concerning symptoms in the interval. The patient voices understanding of current disease status and treatment options and is in agreement with the current care plan.  All questions were answered. The patient knows to call the clinic with any problems, questions or concerns. We can certainly see the patient much sooner if necessary.  Disclaimer: This note was dictated with voice recognition software. Similar sounding words can inadvertently be transcribed and may not be  corrected upon review.

## 2018-07-04 NOTE — Telephone Encounter (Signed)
3 cycles already scheduled per 12/4 los.

## 2018-07-24 ENCOUNTER — Encounter: Payer: Self-pay | Admitting: Internal Medicine

## 2018-07-24 ENCOUNTER — Inpatient Hospital Stay (HOSPITAL_BASED_OUTPATIENT_CLINIC_OR_DEPARTMENT_OTHER): Payer: Medicaid Other | Admitting: Internal Medicine

## 2018-07-24 ENCOUNTER — Inpatient Hospital Stay: Payer: Medicaid Other

## 2018-07-24 VITALS — BP 127/91 | HR 96 | Temp 98.9°F | Resp 20 | Ht 70.0 in | Wt 214.3 lb

## 2018-07-24 DIAGNOSIS — C3411 Malignant neoplasm of upper lobe, right bronchus or lung: Secondary | ICD-10-CM | POA: Diagnosis not present

## 2018-07-24 DIAGNOSIS — C787 Secondary malignant neoplasm of liver and intrahepatic bile duct: Secondary | ICD-10-CM

## 2018-07-24 DIAGNOSIS — Z5111 Encounter for antineoplastic chemotherapy: Secondary | ICD-10-CM | POA: Diagnosis not present

## 2018-07-24 DIAGNOSIS — Z79899 Other long term (current) drug therapy: Secondary | ICD-10-CM

## 2018-07-24 DIAGNOSIS — C7951 Secondary malignant neoplasm of bone: Secondary | ICD-10-CM

## 2018-07-24 DIAGNOSIS — I252 Old myocardial infarction: Secondary | ICD-10-CM

## 2018-07-24 DIAGNOSIS — K225 Diverticulum of esophagus, acquired: Secondary | ICD-10-CM

## 2018-07-24 DIAGNOSIS — C3491 Malignant neoplasm of unspecified part of right bronchus or lung: Secondary | ICD-10-CM

## 2018-07-24 DIAGNOSIS — I2510 Atherosclerotic heart disease of native coronary artery without angina pectoris: Secondary | ICD-10-CM

## 2018-07-24 DIAGNOSIS — Z7982 Long term (current) use of aspirin: Secondary | ICD-10-CM

## 2018-07-24 DIAGNOSIS — Z5112 Encounter for antineoplastic immunotherapy: Secondary | ICD-10-CM

## 2018-07-24 DIAGNOSIS — K5730 Diverticulosis of large intestine without perforation or abscess without bleeding: Secondary | ICD-10-CM

## 2018-07-24 DIAGNOSIS — K219 Gastro-esophageal reflux disease without esophagitis: Secondary | ICD-10-CM

## 2018-07-24 DIAGNOSIS — R5383 Other fatigue: Secondary | ICD-10-CM

## 2018-07-24 DIAGNOSIS — Z923 Personal history of irradiation: Secondary | ICD-10-CM

## 2018-07-24 DIAGNOSIS — J90 Pleural effusion, not elsewhere classified: Secondary | ICD-10-CM

## 2018-07-24 LAB — CMP (CANCER CENTER ONLY)
ALT: 35 U/L (ref 0–44)
AST: 28 U/L (ref 15–41)
Albumin: 3.5 g/dL (ref 3.5–5.0)
Alkaline Phosphatase: 97 U/L (ref 38–126)
Anion gap: 10 (ref 5–15)
BUN: 16 mg/dL (ref 6–20)
CHLORIDE: 105 mmol/L (ref 98–111)
CO2: 27 mmol/L (ref 22–32)
CREATININE: 1.14 mg/dL (ref 0.61–1.24)
Calcium: 9.7 mg/dL (ref 8.9–10.3)
GFR, Est AFR Am: >60 mL/min (ref >60)
GFR, Estimated: >60 mL/min (ref >60)
Glucose, Bld: 78 mg/dL (ref 70–99)
POTASSIUM: 4.2 mmol/L (ref 3.5–5.1)
Sodium: 142 mmol/L (ref 135–145)
Total Bilirubin: 0.3 mg/dL (ref 0.3–1.2)
Total Protein: 7.3 g/dL (ref 6.5–8.1)

## 2018-07-24 LAB — CBC WITH DIFFERENTIAL (CANCER CENTER ONLY)
Abs Immature Granulocytes: 0.02 10*3/uL (ref 0.00–0.07)
BASOS PCT: 1 %
Basophils Absolute: 0.1 10*3/uL (ref 0.0–0.1)
Eosinophils Absolute: 0.1 10*3/uL (ref 0.0–0.5)
Eosinophils Relative: 1 %
HCT: 35.5 % — ABNORMAL LOW (ref 39.0–52.0)
Hemoglobin: 11.5 g/dL — ABNORMAL LOW (ref 13.0–17.0)
Immature Granulocytes: 0 %
Lymphocytes Relative: 28 %
Lymphs Abs: 1.4 10*3/uL (ref 0.7–4.0)
MCH: 31.1 pg (ref 26.0–34.0)
MCHC: 32.4 g/dL (ref 30.0–36.0)
MCV: 95.9 fL (ref 80.0–100.0)
MONOS PCT: 12 %
Monocytes Absolute: 0.6 10*3/uL (ref 0.1–1.0)
Neutro Abs: 2.9 10*3/uL (ref 1.7–7.7)
Neutrophils Relative %: 58 %
Platelet Count: 421 10*3/uL — ABNORMAL HIGH (ref 150–400)
RBC: 3.7 MIL/uL — ABNORMAL LOW (ref 4.22–5.81)
RDW: 14.6 % (ref 11.5–15.5)
WBC Count: 5.1 10*3/uL (ref 4.0–10.5)
nRBC: 0 % (ref 0.0–0.2)

## 2018-07-24 LAB — TSH: TSH: 2.235 u[IU]/mL (ref 0.320–4.118)

## 2018-07-24 MED ORDER — ONDANSETRON HCL 4 MG/2ML IJ SOLN
8.0000 mg | Freq: Once | INTRAMUSCULAR | Status: AC
  Administered 2018-07-24: 8 mg via INTRAVENOUS

## 2018-07-24 MED ORDER — DEXAMETHASONE SODIUM PHOSPHATE 10 MG/ML IJ SOLN
INTRAMUSCULAR | Status: AC
  Filled 2018-07-24: qty 1

## 2018-07-24 MED ORDER — SODIUM CHLORIDE 0.9 % IV SOLN
510.0000 mg/m2 | Freq: Once | INTRAVENOUS | Status: AC
  Administered 2018-07-24: 1100 mg via INTRAVENOUS
  Filled 2018-07-24: qty 40

## 2018-07-24 MED ORDER — ONDANSETRON HCL 4 MG/2ML IJ SOLN
INTRAMUSCULAR | Status: AC
  Filled 2018-07-24: qty 4

## 2018-07-24 MED ORDER — SODIUM CHLORIDE 0.9 % IV SOLN
200.0000 mg | Freq: Once | INTRAVENOUS | Status: AC
  Administered 2018-07-24: 200 mg via INTRAVENOUS
  Filled 2018-07-24: qty 8

## 2018-07-24 MED ORDER — SODIUM CHLORIDE 0.9 % IV SOLN
Freq: Once | INTRAVENOUS | Status: AC
  Administered 2018-07-24: 12:00:00 via INTRAVENOUS
  Filled 2018-07-24: qty 250

## 2018-07-24 MED ORDER — DEXAMETHASONE SODIUM PHOSPHATE 10 MG/ML IJ SOLN
10.0000 mg | Freq: Once | INTRAMUSCULAR | Status: AC
  Administered 2018-07-24: 10 mg via INTRAVENOUS

## 2018-07-24 NOTE — Progress Notes (Signed)
[place] [phone]   [phone]  OFFICE PROGRESS NOTE  Anthony Cha, MD [address] [place] [address]  DIAGNOSIS: Stage IVB(T1b, N2, M1c)non-small cell lung cancer, adenocarcinoma presented with right upper lobe lung nodule in addition to right hilar and mediastinal lymphadenopathy as well as metastatic liver and bone disease diagnosed in February 2019.  Biomarker Findings Microsatellite status - MS-Stable Tumor Mutational Burden - TMB-Low (4 Muts/Mb) Genomic Findings For a complete list of the genes assayed, please refer to the Appendix. EGFR exon 20 insertion ([id number]) RB1 loss exons 9-17 TP53 P152L 7 Disease relevant genes with no reportable alterations: KRAS, ALK, BRAF, MET, RET, ERBB2, ROS1  PDL 1 expression 5%  PRIOR THERAPY:  1) Palliative radiotherapy to the metastatic bone lesions in the cervical spine and left femur under the care of Dr. Sondra Come. 2) Systemic chemotherapy with carboplatin for AUC of 5, Alimta 500 mg/M2 and Keytruda 200 mg IV every 3 weeks.  First dose October 04, 2017.  Status post 3 cycles. 3) palliative stereotactic radiotherapy to the progressive liver lesion.  CURRENT THERAPY: Maintenance treatment with Alimta 500 mg/M2 and Keytruda 200 mg IV every 3 weeks.  Status post 10 cycles.  INTERVAL HISTORY: Anthony [name] 47 y.o. male returns to the clinic today for follow-up visit.  The patient is feeling fine today with no concerning complaints except for right upper quadrant abdominal pain that lasted for few days but later resolved.  He denied having any chest pain, shortness of breath except with exertion with no cough or hemoptysis.  He has no nausea, vomiting, diarrhea or constipation.  He denied having any headache or visual changes.  He has been tolerating his maintenance treatment with Alimta and Keytruda fairly well.  He is here today for evaluation before starting cycle #11  of his treatment.  MEDICAL HISTORY: Past Medical History:  Diagnosis Date  . Acid reflux   . History of radiation therapy 09/21/17-10/04/17   C4 spine 30 Gy in 10 fractions, left femur 30 Gy in 10 fractions  . Non-small cell lung cancer ([place])   . NSTEMI (non-ST elevated myocardial infarction) ([place]) 06/13/2017   [name] 06/14/2017  . Tailbone injury since 1993   "cracked"    ALLERGIES:  has No Known Allergies.  MEDICATIONS:  Current Outpatient Medications  Medication Sig Dispense Refill  . acetaminophen (TYLENOL) 500 MG tablet Take 500-1,000 mg by mouth daily as needed for moderate pain or headache.    Marland Kitchen aspirin EC 81 MG EC tablet Take 1 tablet (81 mg total) by mouth daily.    Marland Kitchen atorvastatin (LIPITOR) 40 MG tablet Take 1 tablet (40 mg total) by mouth daily. 90 tablet 3  . cetirizine (ZYRTEC) 10 MG tablet Take 10 mg by mouth daily as needed for allergies.     . chlorproMAZINE (THORAZINE) 25 MG tablet Take 1 tablet (25 mg total) by mouth 3 (three) times daily as needed. 30 tablet 0  . esomeprazole (NEXIUM) 20 MG capsule Take 20 mg by mouth every other day.    . folic acid (FOLVITE) 1 MG tablet Take 1 tablet (1 mg total) by mouth daily. 30 tablet 2  . lisinopril (PRINIVIL,ZESTRIL) 5 MG tablet TAKE 1 TABLET BY MOUTH EVERY DAY 90 tablet 3  . metoprolol tartrate (LOPRESSOR) 25 MG tablet Take 12.5 mg by mouth 2 (two) times daily.    . mirtazapine (REMERON) 30 MG tablet Take 1 tablet (30 mg total)  by mouth at bedtime. 30 tablet 2  . prochlorperazine (COMPAZINE) 10 MG tablet Take 1 tablet (10 mg total) by mouth every 6 (six) hours as needed for nausea or vomiting. 30 tablet 1   No current facility-administered medications for this visit.     SURGICAL HISTORY:  Past Surgical History:  Procedure Laterality Date  . CARDIAC CATHETERIZATION  06/14/2017  . CHOLECYSTECTOMY N/A 12/29/2017   Procedure: LAPAROSCOPIC CHOLECYSTECTOMY;  Surgeon: Ralene Ok, MD;  Location: [facility];  Service:  General;  Laterality: N/A;  . CHOLECYSTECTOMY, LAPAROSCOPIC N/A 12/29/2017  . CORONARY ARTERY BYPASS GRAFT N/A 06/19/2017   Procedure: CORONARY ARTERY BYPASS GRAFTING (CABG), OFF PUMP, times one using the left internal mammary artery to LAD;  Surgeon: Grace Isaac, MD;  Location: [facility];  Service: Open Heart Surgery;  Laterality: N/A;  . ESOPHAGOGASTRODUODENOSCOPY (EGD) WITH PROPOFOL N/A 11/30/2017   Procedure: ESOPHAGOGASTRODUODENOSCOPY (EGD) WITH PROPOFOL;  Surgeon: Gatha Mayer, MD;  Location: [facility];  Service: Endoscopy;  Laterality: N/A;  . LEFT HEART CATH AND CORONARY ANGIOGRAPHY N/A 06/14/2017   Procedure: LEFT HEART CATH AND CORONARY ANGIOGRAPHY;  Surgeon: Sherren Mocha, MD;  Location: [facility];  Service: Cardiovascular;  Laterality: N/A;  . TEE WITHOUT CARDIOVERSION N/A 06/19/2017   Procedure: TRANSESOPHAGEAL ECHOCARDIOGRAM (TEE);  Surgeon: Grace Isaac, MD;  Location: [facility];  Service: Open Heart Surgery;  Laterality: N/A;  . TONSILLECTOMY  ~ 1977    REVIEW OF SYSTEMS:  A comprehensive review of systems was negative except for: Constitutional: positive for fatigue Respiratory: positive for dyspnea on exertion   PHYSICAL EXAMINATION: General appearance: alert, cooperative, fatigued and no distress Head: Normocephalic, without obvious abnormality, atraumatic Neck: no adenopathy, no JVD, supple, symmetrical, trachea midline and thyroid not enlarged, symmetric, no tenderness/mass/nodules Lymph nodes: Cervical, supraclavicular, and axillary nodes normal. Resp: clear to auscultation bilaterally Back: symmetric, no curvature. ROM normal. No CVA tenderness. Cardio: regular rate and rhythm, S1, S2 normal, no murmur, click, rub or gallop GI: soft, non-tender; bowel sounds normal; no masses,  no organomegaly Extremities: extremities normal, atraumatic, no cyanosis or edema  ECOG PERFORMANCE STATUS: 1 - Symptomatic but completely ambulatory  Blood pressure (!)  127/91, pulse 96, temperature 98.9 F (37.2 C), temperature source Oral, resp. rate 20, height '5\' 10"'$  (1.778 m), weight 214 lb 4.8 oz (97.2 kg), SpO2 100 %.  LABORATORY DATA: Lab Results  Component Value Date   WBC 5.1 07/24/2018   HGB 11.5 (L) 07/24/2018   HCT 35.5 (L) 07/24/2018   MCV 95.9 07/24/2018   PLT 421 (H) 07/24/2018      Chemistry      Component Value Date/Time   NA 142 07/04/2018 0950   NA 141 07/17/2017 1006   K 4.0 07/04/2018 0950   CL 108 07/04/2018 0950   CO2 23 07/04/2018 0950   BUN 9 07/04/2018 0950   BUN 12 07/17/2017 1006   CREATININE 1.10 07/04/2018 0950      Component Value Date/Time   CALCIUM 9.8 07/04/2018 0950   ALKPHOS 89 07/04/2018 0950   AST 29 07/04/2018 0950   ALT 39 07/04/2018 0950   BILITOT 0.4 07/04/2018 0950       RADIOGRAPHIC STUDIES: Ct Chest W Contrast  Result Date: 07/02/2018 CLINICAL DATA:  Non-small cell right upper lobe lung cancer diagnosed in 2019, metastatic to bone and liver, chemotherapy in progress. Radiation therapy completed. Shortness of breath on exertion, constipation. EXAM: CT CHEST, ABDOMEN, AND PELVIS WITH CONTRAST TECHNIQUE: Multidetector CT imaging  of the chest, abdomen and pelvis was performed following the standard protocol during bolus administration of intravenous contrast. CONTRAST:  154m OMNIPAQUE IOHEXOL 300 MG/ML  SOLN COMPARISON:  Multiple exams, including 05/01/2018 FINDINGS: CT CHEST FINDINGS Cardiovascular: Prior CABG.  Coronary atherosclerosis. Mediastinum/Nodes: 10 mm right upper paratracheal lymph node (formerly measured at 9 mm) with fatty hilum. 1.0 cm right hilar lymph node, formerly 0.7 cm. Lungs/Pleura: Residual nodularity at the site of the original right upper lobe lesion, currently 1.3 by 1.1 cm on image 63/7, previously 1.4 by 1.1 cm. Morphologically unchanged. There is some indistinctly marginated patchy airspace opacity in the posterior basal segment right lower lobe on images 113-122 of series  7. In addition, there is abnormal new masslike nodularity along the right diaphragmatic pleura measuring up to 8 mm in thickness and with the most confluent area of nodularity about 5.0 cm transverse, as well as with additional nodularity, highly concerning for pleural metastatic disease. Trace right pleural effusion. Minimal stable nodularity along the minor fissure. Musculoskeletal: Mild lower thoracic spondylosis. CT ABDOMEN PELVIS FINDINGS Hepatobiliary: The segment 4 lesion remains relatively indistinct and measures proximally 1.4 by 1.0 cm on image 50/2, formerly 1.4 by 1.2 cm. The small likely benign lesion in segment 6 of the liver shown on the prior hepatic MRI from 09/04/2017 is not readily apparent by CT. No new liver lesions are observed. The gallbladder is absent. Pancreas: Unremarkable Spleen: Unremarkable Adrenals/Urinary Tract: Unremarkable Stomach/Bowel: Mild sigmoid colon diverticulosis. Vascular/Lymphatic: Mild abdominal aortic atherosclerotic vascular disease. Reproductive: Unremarkable Other: No supplemental non-categorized findings. Musculoskeletal: Intervertebral spurring at L5-S1. IMPRESSION: 1. New irregular multifocal nodularity along the right diaphragm/pleural surface compatible with pleural metastatic disease. Trace right pleural effusion. 2. Indistinct posterior basal segment right lower lobe airspace disease, low-grade pneumonia not excluded. 3. The original right upper lobe lesion measures 1.3 by 1.1 cm, previously 1.4 by 1.1 cm. 4. The segment 4 lesion in the liver measures 1.4 by 1.0 cm, formerly 1.4 by 1.2 cm. 5. Other imaging findings of potential clinical significance: Mild sigmoid colon diverticulosis. Aortic Atherosclerosis (ICD10-I70.0). Prior CABG with coronary atherosclerosis. Electronically Signed   By: WVan ClinesM.D.   On: 07/02/2018 09:56   Ct Abdomen Pelvis W Contrast  Result Date: 07/02/2018 CLINICAL DATA:  Non-small cell right upper lobe lung cancer  diagnosed in 2019, metastatic to bone and liver, chemotherapy in progress. Radiation therapy completed. Shortness of breath on exertion, constipation. EXAM: CT CHEST, ABDOMEN, AND PELVIS WITH CONTRAST TECHNIQUE: Multidetector CT imaging of the chest, abdomen and pelvis was performed following the standard protocol during bolus administration of intravenous contrast. CONTRAST:  1033mOMNIPAQUE IOHEXOL 300 MG/ML  SOLN COMPARISON:  Multiple exams, including 05/01/2018 FINDINGS: CT CHEST FINDINGS Cardiovascular: Prior CABG.  Coronary atherosclerosis. Mediastinum/Nodes: 10 mm right upper paratracheal lymph node (formerly measured at 9 mm) with fatty hilum. 1.0 cm right hilar lymph node, formerly 0.7 cm. Lungs/Pleura: Residual nodularity at the site of the original right upper lobe lesion, currently 1.3 by 1.1 cm on image 63/7, previously 1.4 by 1.1 cm. Morphologically unchanged. There is some indistinctly marginated patchy airspace opacity in the posterior basal segment right lower lobe on images 113-122 of series 7. In addition, there is abnormal new masslike nodularity along the right diaphragmatic pleura measuring up to 8 mm in thickness and with the most confluent area of nodularity about 5.0 cm transverse, as well as with additional nodularity, highly concerning for pleural metastatic disease. Trace right pleural effusion. Minimal stable nodularity along the  minor fissure. Musculoskeletal: Mild lower thoracic spondylosis. CT ABDOMEN PELVIS FINDINGS Hepatobiliary: The segment 4 lesion remains relatively indistinct and measures proximally 1.4 by 1.0 cm on image 50/2, formerly 1.4 by 1.2 cm. The small likely benign lesion in segment 6 of the liver shown on the prior hepatic MRI from 09/04/2017 is not readily apparent by CT. No new liver lesions are observed. The gallbladder is absent. Pancreas: Unremarkable Spleen: Unremarkable Adrenals/Urinary Tract: Unremarkable Stomach/Bowel: Mild sigmoid colon diverticulosis.  Vascular/Lymphatic: Mild abdominal aortic atherosclerotic vascular disease. Reproductive: Unremarkable Other: No supplemental non-categorized findings. Musculoskeletal: Intervertebral spurring at L5-S1. IMPRESSION: 1. New irregular multifocal nodularity along the right diaphragm/pleural surface compatible with pleural metastatic disease. Trace right pleural effusion. 2. Indistinct posterior basal segment right lower lobe airspace disease, low-grade pneumonia not excluded. 3. The original right upper lobe lesion measures 1.3 by 1.1 cm, previously 1.4 by 1.1 cm. 4. The segment 4 lesion in the liver measures 1.4 by 1.0 cm, formerly 1.4 by 1.2 cm. 5. Other imaging findings of potential clinical significance: Mild sigmoid colon diverticulosis. Aortic Atherosclerosis (ICD10-I70.0). Prior CABG with coronary atherosclerosis. Electronically Signed   By: Van Clines M.D.   On: 07/02/2018 09:56    ASSESSMENT AND PLAN: This is a very pleasant 47 years old white male with stage IV non-small cell lung cancer, adenocarcinoma with resistant EGFR mutation in exon 20 and PDL 1 expression of 5%. The patient completed a course of palliative radiotherapy to the cervical spine as well as left hip under the care of Dr. Sondra Come.Marland Kitchen He completed induction systemic chemotherapy with carboplatin, Alimta and Keytruda status post 3 cycles.  He has no evidence for disease progression after the induction phase of his treatment. The patient was started on maintenance treatment with Alimta and Ketruda (pembrolizumab) status post 10 cycles.  He has been tolerating this treatment well with no concerning adverse effects. I recommended for the patient to proceed with cycle #11 today as scheduled. He will come back for follow-up visit in 3 weeks for evaluation before starting the next cycle of his treatment. He was advised to call immediately if he has any concerning symptoms in the interval. The patient voices understanding of current  disease status and treatment options and is in agreement with the current care plan.  All questions were answered. The patient knows to call the clinic with any problems, questions or concerns. We can certainly see the patient much sooner if necessary.  Disclaimer: This note was dictated with voice recognition software. Similar sounding words can inadvertently be transcribed and may not be corrected upon review.

## 2018-07-24 NOTE — Patient Instructions (Signed)
[facility] Discharge Instructions for Patients Receiving Chemotherapy  Today you received the following chemotherapy agents Keytruda and Alimta   To help prevent nausea and vomiting after your treatment, we encourage you to take your nausea medication as directed.    If you develop nausea and vomiting that is not controlled by your nausea medication, call the clinic.   BELOW ARE SYMPTOMS THAT SHOULD BE REPORTED IMMEDIATELY:  *FEVER GREATER THAN 100.5 F  *CHILLS WITH OR WITHOUT FEVER  NAUSEA AND VOMITING THAT IS NOT CONTROLLED WITH YOUR NAUSEA MEDICATION  *UNUSUAL SHORTNESS OF BREATH  *UNUSUAL BRUISING OR BLEEDING  TENDERNESS IN MOUTH AND THROAT WITH OR WITHOUT PRESENCE OF ULCERS  *URINARY PROBLEMS  *BOWEL PROBLEMS  UNUSUAL RASH Items with * indicate a potential emergency and should be followed up as soon as possible.  Feel free to call the clinic should you have any questions or concerns. The clinic phone number is [phone].  Please show the [facility] at check-in to the Emergency Department and triage nurse.

## 2018-07-26 ENCOUNTER — Telehealth: Payer: Self-pay | Admitting: Internal Medicine

## 2018-07-26 NOTE — Telephone Encounter (Signed)
Scheduled appt per 12/24 los - pt to get an updated schedule next treatment

## 2018-08-07 ENCOUNTER — Other Ambulatory Visit: Payer: Self-pay | Admitting: Medical Oncology

## 2018-08-07 DIAGNOSIS — C3491 Malignant neoplasm of unspecified part of right bronchus or lung: Secondary | ICD-10-CM

## 2018-08-07 MED ORDER — MIRTAZAPINE 30 MG PO TABS: 30.0000 mg | ORAL_TABLET | Freq: Every day | ORAL | 2 refills | Status: DC

## 2018-08-08 ENCOUNTER — Other Ambulatory Visit: Payer: Self-pay | Admitting: Medical Oncology

## 2018-08-08 DIAGNOSIS — C3491 Malignant neoplasm of unspecified part of right bronchus or lung: Secondary | ICD-10-CM

## 2018-08-08 MED ORDER — FOLIC ACID 1 MG PO TABS: 1.0000 mg | ORAL_TABLET | Freq: Every day | ORAL | 2 refills | Status: DC

## 2018-08-14 ENCOUNTER — Inpatient Hospital Stay: Payer: Medicaid Other

## 2018-08-14 ENCOUNTER — Inpatient Hospital Stay: Payer: Medicaid Other | Attending: Internal Medicine

## 2018-08-14 ENCOUNTER — Encounter: Payer: Self-pay | Admitting: Internal Medicine

## 2018-08-14 ENCOUNTER — Inpatient Hospital Stay (HOSPITAL_BASED_OUTPATIENT_CLINIC_OR_DEPARTMENT_OTHER): Payer: Medicaid Other | Admitting: Internal Medicine

## 2018-08-14 VITALS — BP 139/83 | HR 101 | Temp 98.4°F | Resp 18 | Ht 70.0 in | Wt 217.3 lb

## 2018-08-14 DIAGNOSIS — I252 Old myocardial infarction: Secondary | ICD-10-CM

## 2018-08-14 DIAGNOSIS — C3491 Malignant neoplasm of unspecified part of right bronchus or lung: Secondary | ICD-10-CM

## 2018-08-14 DIAGNOSIS — K219 Gastro-esophageal reflux disease without esophagitis: Secondary | ICD-10-CM | POA: Diagnosis not present

## 2018-08-14 DIAGNOSIS — C787 Secondary malignant neoplasm of liver and intrahepatic bile duct: Secondary | ICD-10-CM

## 2018-08-14 DIAGNOSIS — Z79899 Other long term (current) drug therapy: Secondary | ICD-10-CM

## 2018-08-14 DIAGNOSIS — C7951 Secondary malignant neoplasm of bone: Secondary | ICD-10-CM | POA: Diagnosis not present

## 2018-08-14 DIAGNOSIS — Z7982 Long term (current) use of aspirin: Secondary | ICD-10-CM | POA: Diagnosis not present

## 2018-08-14 DIAGNOSIS — Z5112 Encounter for antineoplastic immunotherapy: Secondary | ICD-10-CM | POA: Diagnosis present

## 2018-08-14 DIAGNOSIS — Z923 Personal history of irradiation: Secondary | ICD-10-CM

## 2018-08-14 DIAGNOSIS — C3411 Malignant neoplasm of upper lobe, right bronchus or lung: Secondary | ICD-10-CM | POA: Diagnosis not present

## 2018-08-14 DIAGNOSIS — Z5111 Encounter for antineoplastic chemotherapy: Secondary | ICD-10-CM

## 2018-08-14 DIAGNOSIS — C3490 Malignant neoplasm of unspecified part of unspecified bronchus or lung: Secondary | ICD-10-CM

## 2018-08-14 LAB — CMP (CANCER CENTER ONLY)
ALBUMIN: 3.1 g/dL — AB (ref 3.5–5.0)
ALT: 25 U/L (ref 0–44)
AST: 20 U/L (ref 15–41)
Alkaline Phosphatase: 105 U/L (ref 38–126)
Anion gap: 11 (ref 5–15)
BUN: 15 mg/dL (ref 6–20)
CO2: 25 mmol/L (ref 22–32)
Calcium: 9.7 mg/dL (ref 8.9–10.3)
Chloride: 105 mmol/L (ref 98–111)
Creatinine: 1.16 mg/dL (ref 0.61–1.24)
GFR, Est AFR Am: >60 mL/min (ref >60)
GFR, Estimated: >60 mL/min (ref >60)
GLUCOSE: 138 mg/dL — AB (ref 70–99)
Potassium: 4 mmol/L (ref 3.5–5.1)
Sodium: 141 mmol/L (ref 135–145)
Total Bilirubin: 0.4 mg/dL (ref 0.3–1.2)
Total Protein: 7.2 g/dL (ref 6.5–8.1)

## 2018-08-14 LAB — CBC WITH DIFFERENTIAL (CANCER CENTER ONLY)
ABS IMMATURE GRANULOCYTES: 0.02 10*3/uL (ref 0.00–0.07)
BASOS ABS: 0.1 10*3/uL (ref 0.0–0.1)
Basophils Relative: 1 %
Eosinophils Absolute: 0.1 10*3/uL (ref 0.0–0.5)
Eosinophils Relative: 1 %
HCT: 34.1 % — ABNORMAL LOW (ref 39.0–52.0)
Hemoglobin: 11.4 g/dL — ABNORMAL LOW (ref 13.0–17.0)
Immature Granulocytes: 0 %
Lymphocytes Relative: 14 %
Lymphs Abs: 0.9 10*3/uL (ref 0.7–4.0)
MCH: 30.7 pg (ref 26.0–34.0)
MCHC: 33.4 g/dL (ref 30.0–36.0)
MCV: 91.9 fL (ref 80.0–100.0)
Monocytes Absolute: 0.4 10*3/uL (ref 0.1–1.0)
Monocytes Relative: 7 %
NEUTROS ABS: 4.8 10*3/uL (ref 1.7–7.7)
NEUTROS PCT: 77 %
NRBC: 0 % (ref 0.0–0.2)
Platelet Count: 383 10*3/uL (ref 150–400)
RBC: 3.71 MIL/uL — ABNORMAL LOW (ref 4.22–5.81)
RDW: 14 % (ref 11.5–15.5)
WBC Count: 6.2 10*3/uL (ref 4.0–10.5)

## 2018-08-14 LAB — TSH: TSH: 1.885 u[IU]/mL (ref 0.320–4.118)

## 2018-08-14 MED ORDER — SODIUM CHLORIDE 0.9 % IV SOLN
200.0000 mg | Freq: Once | INTRAVENOUS | Status: AC
  Administered 2018-08-14: 200 mg via INTRAVENOUS
  Filled 2018-08-14: qty 8

## 2018-08-14 MED ORDER — DEXAMETHASONE SODIUM PHOSPHATE 10 MG/ML IJ SOLN
INTRAMUSCULAR | Status: AC
  Filled 2018-08-14: qty 1

## 2018-08-14 MED ORDER — ONDANSETRON HCL 4 MG/2ML IJ SOLN
INTRAMUSCULAR | Status: AC
  Filled 2018-08-14: qty 4

## 2018-08-14 MED ORDER — ONDANSETRON HCL 4 MG/2ML IJ SOLN
8.0000 mg | Freq: Once | INTRAMUSCULAR | Status: AC
  Administered 2018-08-14: 8 mg via INTRAVENOUS

## 2018-08-14 MED ORDER — SODIUM CHLORIDE 0.9 % IV SOLN
510.0000 mg/m2 | Freq: Once | INTRAVENOUS | Status: AC
  Administered 2018-08-14: 1100 mg via INTRAVENOUS
  Filled 2018-08-14: qty 40

## 2018-08-14 MED ORDER — SODIUM CHLORIDE 0.9 % IV SOLN
Freq: Once | INTRAVENOUS | Status: AC
  Administered 2018-08-14: 12:00:00 via INTRAVENOUS
  Filled 2018-08-14: qty 250

## 2018-08-14 MED ORDER — DEXAMETHASONE SODIUM PHOSPHATE 10 MG/ML IJ SOLN
10.0000 mg | Freq: Once | INTRAMUSCULAR | Status: AC
  Administered 2018-08-14: 10 mg via INTRAVENOUS

## 2018-08-14 NOTE — Patient Instructions (Signed)
[facility] Discharge Instructions for Patients Receiving Chemotherapy  Today you received the following chemotherapy agents Keytruda and Alimta   To help prevent nausea and vomiting after your treatment, we encourage you to take your nausea medication as directed.    If you develop nausea and vomiting that is not controlled by your nausea medication, call the clinic.   BELOW ARE SYMPTOMS THAT SHOULD BE REPORTED IMMEDIATELY:  *FEVER GREATER THAN 100.5 F  *CHILLS WITH OR WITHOUT FEVER  NAUSEA AND VOMITING THAT IS NOT CONTROLLED WITH YOUR NAUSEA MEDICATION  *UNUSUAL SHORTNESS OF BREATH  *UNUSUAL BRUISING OR BLEEDING  TENDERNESS IN MOUTH AND THROAT WITH OR WITHOUT PRESENCE OF ULCERS  *URINARY PROBLEMS  *BOWEL PROBLEMS  UNUSUAL RASH Items with * indicate a potential emergency and should be followed up as soon as possible.  Feel free to call the clinic should you have any questions or concerns. The clinic phone number is [phone].  Please show the [facility] at check-in to the Emergency Department and triage nurse.

## 2018-08-14 NOTE — Progress Notes (Signed)
[facility] [phone]   [phone]  OFFICE PROGRESS NOTE  [name], MD [address] [place] [address]  DIAGNOSIS: Stage IVB(T1b, N2, M1c)non-small cell lung cancer, adenocarcinoma presented with right upper lobe lung nodule in addition to right hilar and mediastinal lymphadenopathy as well as metastatic liver and bone disease diagnosed in February 2019.  Biomarker Findings Microsatellite status - MS-Stable Tumor Mutational Burden - TMB-Low (4 Muts/Mb) Genomic Findings For a complete list of the genes assayed, please refer to the Appendix. EGFR exon 20 insertion ([id number]) RB1 loss exons 9-17 TP53 P152L 7 Disease relevant genes with no reportable alterations: KRAS, ALK, BRAF, MET, RET, ERBB2, ROS1  PDL 1 expression 5%  PRIOR THERAPY:  1) Palliative radiotherapy to the metastatic bone lesions in the cervical spine and left femur under the care of Dr. Sondra Come. 2) Systemic chemotherapy with carboplatin for AUC of 5, Alimta 500 mg/M2 and Keytruda 200 mg IV every 3 weeks.  First dose October 04, 2017.  Status post 3 cycles. 3) palliative stereotactic radiotherapy to the progressive liver lesion.  CURRENT THERAPY: Maintenance treatment with Alimta 500 mg/M2 and Keytruda 200 mg IV every 3 weeks.  Status post 11 cycles.  INTERVAL HISTORY: Anthony [name] 48 y.o. male returns to the clinic today for follow-up visit.  The patient is feeling fine today with no concerning complaints except for irritation on the right side of the chest.  He also had lack of sleep recently and started CBD oil and felt much better.  He denied having any chest pain, shortness breath, cough or hemoptysis.  He denied having any fever or chills.  He has no nausea, vomiting, diarrhea or constipation.  He denied having any significant weight loss or night sweats.  He is here today for evaluation before starting cycle #12.  MEDICAL HISTORY: Past  Medical History:  Diagnosis Date  . Acid reflux   . History of radiation therapy 09/21/17-10/04/17   C4 spine 30 Gy in 10 fractions, left femur 30 Gy in 10 fractions  . Non-small cell lung cancer ([facility])   . NSTEMI (non-ST elevated myocardial infarction) ([facility]) 06/13/2017   [name] 06/14/2017  . Tailbone injury since 1993   "cracked"    ALLERGIES:  has No Known Allergies.  MEDICATIONS:  Current Outpatient Medications  Medication Sig Dispense Refill  . acetaminophen (TYLENOL) 500 MG tablet Take 500-1,000 mg by mouth daily as needed for moderate pain or headache.    Marland Kitchen aspirin EC 81 MG EC tablet Take 1 tablet (81 mg total) by mouth daily.    Marland Kitchen atorvastatin (LIPITOR) 40 MG tablet Take 1 tablet (40 mg total) by mouth daily. 90 tablet 3  . cetirizine (ZYRTEC) 10 MG tablet Take 10 mg by mouth daily as needed for allergies.     . chlorproMAZINE (THORAZINE) 25 MG tablet Take 1 tablet (25 mg total) by mouth 3 (three) times daily as needed. 30 tablet 0  . esomeprazole (NEXIUM) 20 MG capsule Take 20 mg by mouth every other day.    . folic acid (FOLVITE) 1 MG tablet Take 1 tablet (1 mg total) by mouth daily. 30 tablet 2  . lisinopril (PRINIVIL,ZESTRIL) 5 MG tablet TAKE 1 TABLET BY MOUTH EVERY DAY 90 tablet 3  . metoprolol tartrate (LOPRESSOR) 25 MG tablet Take 12.5 mg by mouth 2 (two) times daily.    . mirtazapine (REMERON) 30 MG tablet Take 1 tablet (30 mg total) by mouth  at bedtime. 30 tablet 2  . prochlorperazine (COMPAZINE) 10 MG tablet Take 1 tablet (10 mg total) by mouth every 6 (six) hours as needed for nausea or vomiting. 30 tablet 1   No current facility-administered medications for this visit.     SURGICAL HISTORY:  Past Surgical History:  Procedure Laterality Date  . CARDIAC CATHETERIZATION  06/14/2017  . CHOLECYSTECTOMY N/A 12/29/2017   Procedure: LAPAROSCOPIC CHOLECYSTECTOMY;  Surgeon: Ralene Ok, MD;  [facility];  Service: General;  Laterality: N/A;  . CHOLECYSTECTOMY,  LAPAROSCOPIC N/A 12/29/2017  . CORONARY ARTERY BYPASS GRAFT N/A 06/19/2017   Procedure: CORONARY ARTERY BYPASS GRAFTING (CABG), OFF PUMP, times one using the left internal mammary artery to LAD;  Surgeon: Grace Isaac, MD;  Location: [facility];  Service: Open Heart Surgery;  Laterality: N/A;  . ESOPHAGOGASTRODUODENOSCOPY (EGD) WITH PROPOFOL N/A 11/30/2017   Procedure: ESOPHAGOGASTRODUODENOSCOPY (EGD) WITH PROPOFOL;  Surgeon: Gatha Mayer, MD;  [facility];  Service: Endoscopy;  Laterality: N/A;  . LEFT HEART CATH AND CORONARY ANGIOGRAPHY N/A 06/14/2017   Procedure: LEFT HEART CATH AND CORONARY ANGIOGRAPHY;  Surgeon: Sherren Mocha, MD;  Location: [facility];  Service: Cardiovascular;  Laterality: N/A;  . TEE WITHOUT CARDIOVERSION N/A 06/19/2017   Procedure: TRANSESOPHAGEAL ECHOCARDIOGRAM (TEE);  Surgeon: Grace Isaac, MD;  Location: [facility];  Service: Open Heart Surgery;  Laterality: N/A;  . TONSILLECTOMY  ~ 1977    REVIEW OF SYSTEMS:  A comprehensive review of systems was negative except for: Constitutional: positive for fatigue Behavioral/Psych: positive for sleep disturbance   PHYSICAL EXAMINATION: General appearance: alert, cooperative and no distress Head: Normocephalic, without obvious abnormality, atraumatic Neck: no adenopathy, no JVD, supple, symmetrical, trachea midline and thyroid not enlarged, symmetric, no tenderness/mass/nodules Lymph nodes: Cervical, supraclavicular, and axillary nodes normal. Resp: clear to auscultation bilaterally Back: symmetric, no curvature. ROM normal. No CVA tenderness. Cardio: regular rate and rhythm, S1, S2 normal, no murmur, click, rub or gallop GI: soft, non-tender; bowel sounds normal; no masses,  no organomegaly Extremities: extremities normal, atraumatic, no cyanosis or edema  ECOG PERFORMANCE STATUS: 1 - Symptomatic but completely ambulatory  Blood pressure 139/83, pulse (!) 101, temperature 98.4 F (36.9 C),  temperature source Oral, resp. rate 18, height '5\' 10"'$  (1.778 m), weight 217 lb 4.8 oz (98.6 kg), SpO2 99 %.  LABORATORY DATA: Lab Results  Component Value Date   WBC 6.2 08/14/2018   HGB 11.4 (L) 08/14/2018   HCT 34.1 (L) 08/14/2018   MCV 91.9 08/14/2018   PLT 383 08/14/2018      Chemistry      Component Value Date/Time   NA 142 07/24/2018 1011   NA 141 07/17/2017 1006   K 4.2 07/24/2018 1011   CL 105 07/24/2018 1011   CO2 27 07/24/2018 1011   BUN 16 07/24/2018 1011   BUN 12 07/17/2017 1006   CREATININE 1.14 07/24/2018 1011      Component Value Date/Time   CALCIUM 9.7 07/24/2018 1011   ALKPHOS 97 07/24/2018 1011   AST 28 07/24/2018 1011   ALT 35 07/24/2018 1011   BILITOT 0.3 07/24/2018 1011       RADIOGRAPHIC STUDIES: No results found.  ASSESSMENT AND PLAN: This is a very pleasant 48 years old white male with stage IV non-small cell lung cancer, adenocarcinoma with resistant EGFR mutation in exon 20 and PDL 1 expression of 5%. The patient completed a course of palliative radiotherapy to the cervical spine as well as left hip  under the care of Dr. Sondra Come.Marland Kitchen He completed induction systemic chemotherapy with carboplatin, Alimta and Keytruda status post 3 cycles.  He has no evidence for disease progression after the induction phase of his treatment. The patient was started on maintenance treatment with Alimta and Ketruda (pembrolizumab) status post 11 cycles.  The patient has been tolerating this treatment well with no concerning adverse effects. I recommended for him to proceed with cycle #12 today as scheduled. I will see him back for follow-up visit in 3 weeks for evaluation after repeating CT scan of the chest, abdomen and pelvis for restaging of his disease. He was advised to call immediately if he has any concerning symptoms in the interval. The patient voices understanding of current disease status and treatment options and is in agreement with the current care  plan.  All questions were answered. The patient knows to call the clinic with any problems, questions or concerns. We can certainly see the patient much sooner if necessary.  Disclaimer: This note was dictated with voice recognition software. Similar sounding words can inadvertently be transcribed and may not be corrected upon review.

## 2018-08-15 ENCOUNTER — Telehealth: Payer: Self-pay | Admitting: Internal Medicine

## 2018-08-15 NOTE — Telephone Encounter (Signed)
Scheduled appt per 1/14 los - pt to get an updated schedule next scheduled visit.

## 2018-08-29 ENCOUNTER — Telehealth: Payer: Self-pay | Admitting: *Deleted

## 2018-08-29 NOTE — Telephone Encounter (Signed)
Please arrange for him to come and see Lucianne Lei, then we will order imaging studies as appropriate.  Thank you

## 2018-08-29 NOTE — Telephone Encounter (Signed)
Message to scheduling for pt to see [facility]. Pt aware.

## 2018-08-29 NOTE — Telephone Encounter (Signed)
Pt called with c/o right  Hip pain, hurts to cross legs, recent bowel blockage and stool softeners are not helping. Pt asking if he will have imaging on his hips to show why he is having this pain. Will review with MD

## 2018-08-30 ENCOUNTER — Ambulatory Visit (HOSPITAL_COMMUNITY)
Admission: RE | Admit: 2018-08-30 | Discharge: 2018-08-30 | Disposition: A | Discharge status: Home / Self Care | Payer: Medicaid Other | Source: Ambulatory Visit | Attending: Medical | Admitting: Medical

## 2018-08-30 ENCOUNTER — Inpatient Hospital Stay (HOSPITAL_BASED_OUTPATIENT_CLINIC_OR_DEPARTMENT_OTHER): Payer: Medicaid Other | Admitting: Medical

## 2018-08-30 ENCOUNTER — Inpatient Hospital Stay: Payer: Medicaid Other

## 2018-08-30 VITALS — BP 119/81 | HR 107 | Temp 98.4°F | Resp 18 | Ht 70.0 in | Wt 214.1 lb

## 2018-08-30 DIAGNOSIS — K5900 Constipation, unspecified: Secondary | ICD-10-CM | POA: Diagnosis not present

## 2018-08-30 DIAGNOSIS — C3491 Malignant neoplasm of unspecified part of right bronchus or lung: Secondary | ICD-10-CM

## 2018-08-30 DIAGNOSIS — R3911 Hesitancy of micturition: Secondary | ICD-10-CM | POA: Diagnosis not present

## 2018-08-30 DIAGNOSIS — C7951 Secondary malignant neoplasm of bone: Secondary | ICD-10-CM

## 2018-08-30 DIAGNOSIS — Z5112 Encounter for antineoplastic immunotherapy: Secondary | ICD-10-CM | POA: Diagnosis not present

## 2018-08-30 LAB — CBC WITH DIFFERENTIAL (CANCER CENTER ONLY)
Abs Immature Granulocytes: 0.06 10*3/uL (ref 0.00–0.07)
Basophils Absolute: 0 10*3/uL (ref 0.0–0.1)
Basophils Relative: 0 %
Eosinophils Absolute: 0.1 10*3/uL (ref 0.0–0.5)
Eosinophils Relative: 2 %
HCT: 32 % — ABNORMAL LOW (ref 39.0–52.0)
Hemoglobin: 10.3 g/dL — ABNORMAL LOW (ref 13.0–17.0)
IMMATURE GRANULOCYTES: 1 %
Lymphocytes Relative: 16 %
Lymphs Abs: 1 10*3/uL (ref 0.7–4.0)
MCH: 30.1 pg (ref 26.0–34.0)
MCHC: 32.2 g/dL (ref 30.0–36.0)
MCV: 93.6 fL (ref 80.0–100.0)
Monocytes Absolute: 0.7 10*3/uL (ref 0.1–1.0)
Monocytes Relative: 12 %
NEUTROS PCT: 69 %
NRBC: 0 % (ref 0.0–0.2)
Neutro Abs: 4.1 10*3/uL (ref 1.7–7.7)
Platelet Count: 417 10*3/uL — ABNORMAL HIGH (ref 150–400)
RBC: 3.42 MIL/uL — ABNORMAL LOW (ref 4.22–5.81)
RDW: 14.4 % (ref 11.5–15.5)
WBC Count: 6 10*3/uL (ref 4.0–10.5)

## 2018-08-30 LAB — CMP (CANCER CENTER ONLY)
ALBUMIN: 3 g/dL — AB (ref 3.5–5.0)
ALT: 29 U/L (ref 0–44)
AST: 26 U/L (ref 15–41)
Alkaline Phosphatase: 106 U/L (ref 38–126)
Anion gap: 9 (ref 5–15)
BUN: 13 mg/dL (ref 6–20)
CO2: 28 mmol/L (ref 22–32)
Calcium: 9.8 mg/dL (ref 8.9–10.3)
Chloride: 102 mmol/L (ref 98–111)
Creatinine: 1.18 mg/dL (ref 0.61–1.24)
GFR, Est AFR Am: >60 mL/min (ref >60)
GFR, Estimated: >60 mL/min (ref >60)
GLUCOSE: 120 mg/dL — AB (ref 70–99)
Potassium: 4.2 mmol/L (ref 3.5–5.1)
Sodium: 139 mmol/L (ref 135–145)
Total Bilirubin: 0.4 mg/dL (ref 0.3–1.2)
Total Protein: 7.4 g/dL (ref 6.5–8.1)

## 2018-08-30 LAB — TSH: TSH: 2.135 u[IU]/mL (ref 0.320–4.118)

## 2018-08-30 MED ORDER — GADOBUTROL 1 MMOL/ML IV SOLN
10.0000 mL | Freq: Once | INTRAVENOUS | Status: AC | PRN
  Administered 2018-08-30: 10 mL via INTRAVENOUS

## 2018-08-30 NOTE — Patient Instructions (Signed)
Constipation Management  Magnesium Citrate, drink 1/2 bottle, drink remainder if no bowel movement with 30 to 60 minutes  Or  30 mg (1 tablespoon) of Milk of Magnesia in 8 ounces of prune juice, warm in microwave for 20 seconds    Begin the following after you have had a bowel movement:  Senna-S, 1 to 2 tablets twice daily  MiraLAX 17 grams in 8 ounces of liquids 1 to 2 times daily as needed   Remember to remain well hydrated. Drink, Drink, Drink non-caffeinated beverages.   Adjust these medications based on your response. If your bowel movements become too loose then decrease the amount of Senna-S and/or MiraLAX that you are using. If your bowel movements become too firm or are difficult to pass, the increase the amount of Senna-S and/or MiraLAX that you are using and increase your intake of water.   NEVER, NEVER, NEVER use an enema or suppositories unless your provider has given their approval.

## 2018-09-03 ENCOUNTER — Encounter (HOSPITAL_COMMUNITY): Payer: Self-pay

## 2018-09-03 ENCOUNTER — Ambulatory Visit (HOSPITAL_COMMUNITY)
Admission: RE | Admit: 2018-09-03 | Discharge: 2018-09-03 | Disposition: A | Discharge status: Home / Self Care | Payer: Medicaid Other | Source: Ambulatory Visit | Attending: Internal Medicine | Admitting: Internal Medicine

## 2018-09-03 DIAGNOSIS — C3490 Malignant neoplasm of unspecified part of unspecified bronchus or lung: Secondary | ICD-10-CM | POA: Insufficient documentation

## 2018-09-03 MED ORDER — SODIUM CHLORIDE (PF) 0.9 % IJ SOLN
INTRAMUSCULAR | Status: AC
  Filled 2018-09-03: qty 50

## 2018-09-03 MED ORDER — IOHEXOL 300 MG/ML  SOLN
100.0000 mL | Freq: Once | INTRAMUSCULAR | Status: AC | PRN
  Administered 2018-09-03: 100 mL via INTRAVENOUS

## 2018-09-04 ENCOUNTER — Inpatient Hospital Stay: Payer: Medicaid Other

## 2018-09-04 ENCOUNTER — Encounter: Payer: Self-pay | Admitting: Internal Medicine

## 2018-09-04 ENCOUNTER — Inpatient Hospital Stay: Payer: Medicaid Other | Attending: Internal Medicine | Admitting: Internal Medicine

## 2018-09-04 VITALS — BP 126/86 | HR 105 | Temp 99.3°F | Resp 18 | Ht 70.0 in | Wt 214.9 lb

## 2018-09-04 DIAGNOSIS — Z9221 Personal history of antineoplastic chemotherapy: Secondary | ICD-10-CM | POA: Diagnosis not present

## 2018-09-04 DIAGNOSIS — B370 Candidal stomatitis: Secondary | ICD-10-CM | POA: Insufficient documentation

## 2018-09-04 DIAGNOSIS — D702 Other drug-induced agranulocytosis: Secondary | ICD-10-CM | POA: Insufficient documentation

## 2018-09-04 DIAGNOSIS — I700 Atherosclerosis of aorta: Secondary | ICD-10-CM | POA: Diagnosis not present

## 2018-09-04 DIAGNOSIS — R079 Chest pain, unspecified: Secondary | ICD-10-CM

## 2018-09-04 DIAGNOSIS — K219 Gastro-esophageal reflux disease without esophagitis: Secondary | ICD-10-CM | POA: Insufficient documentation

## 2018-09-04 DIAGNOSIS — C3412 Malignant neoplasm of upper lobe, left bronchus or lung: Secondary | ICD-10-CM

## 2018-09-04 DIAGNOSIS — C779 Secondary and unspecified malignant neoplasm of lymph node, unspecified: Secondary | ICD-10-CM | POA: Diagnosis not present

## 2018-09-04 DIAGNOSIS — Z79899 Other long term (current) drug therapy: Secondary | ICD-10-CM | POA: Diagnosis not present

## 2018-09-04 DIAGNOSIS — Z5112 Encounter for antineoplastic immunotherapy: Secondary | ICD-10-CM

## 2018-09-04 DIAGNOSIS — R531 Weakness: Secondary | ICD-10-CM

## 2018-09-04 DIAGNOSIS — I252 Old myocardial infarction: Secondary | ICD-10-CM | POA: Insufficient documentation

## 2018-09-04 DIAGNOSIS — R5383 Other fatigue: Secondary | ICD-10-CM

## 2018-09-04 DIAGNOSIS — D709 Neutropenia, unspecified: Secondary | ICD-10-CM | POA: Insufficient documentation

## 2018-09-04 DIAGNOSIS — C7951 Secondary malignant neoplasm of bone: Secondary | ICD-10-CM

## 2018-09-04 DIAGNOSIS — Z5111 Encounter for antineoplastic chemotherapy: Secondary | ICD-10-CM | POA: Diagnosis not present

## 2018-09-04 DIAGNOSIS — Z923 Personal history of irradiation: Secondary | ICD-10-CM | POA: Diagnosis not present

## 2018-09-04 DIAGNOSIS — J90 Pleural effusion, not elsewhere classified: Secondary | ICD-10-CM | POA: Diagnosis not present

## 2018-09-04 DIAGNOSIS — R590 Localized enlarged lymph nodes: Secondary | ICD-10-CM | POA: Diagnosis not present

## 2018-09-04 DIAGNOSIS — M5127 Other intervertebral disc displacement, lumbosacral region: Secondary | ICD-10-CM | POA: Diagnosis not present

## 2018-09-04 DIAGNOSIS — C787 Secondary malignant neoplasm of liver and intrahepatic bile duct: Secondary | ICD-10-CM | POA: Insufficient documentation

## 2018-09-04 DIAGNOSIS — Z7189 Other specified counseling: Secondary | ICD-10-CM

## 2018-09-04 DIAGNOSIS — Z7982 Long term (current) use of aspirin: Secondary | ICD-10-CM | POA: Insufficient documentation

## 2018-09-04 DIAGNOSIS — C3491 Malignant neoplasm of unspecified part of right bronchus or lung: Secondary | ICD-10-CM

## 2018-09-04 MED ORDER — OXYCODONE-ACETAMINOPHEN 5-325 MG PO TABS: 1.0000 | ORAL_TABLET | Freq: Four times a day (QID) | ORAL | 0 refills | Status: DC | PRN

## 2018-09-04 MED ORDER — CYCLOBENZAPRINE HCL 5 MG PO TABS: 5.0000 mg | ORAL_TABLET | Freq: Three times a day (TID) | ORAL | 0 refills | Status: DC | PRN

## 2018-09-04 MED ORDER — DEXAMETHASONE 4 MG PO TABS: ORAL_TABLET | ORAL | 0 refills | Status: DC

## 2018-09-04 MED FILL — CYCLOBENZAPRINE HCL 5 MG TA: 5 | 10 days supply | Qty: 30 | Fill #0

## 2018-09-04 MED FILL — OXYCODONE-ACETAMINOPHEN 5-3: 5-325 | 8 days supply | Qty: 30 | Fill #0

## 2018-09-04 MED FILL — DEXAMETHASONE 4 MG TABLET: 4 | 13 days supply | Qty: 40 | Fill #0

## 2018-09-04 NOTE — Progress Notes (Signed)
[facility] [phone]   [phone]  OFFICE PROGRESS NOTE  [name], MD [address] [place] [address]  DIAGNOSIS: Stage IVB(T1b, N2, M1c)non-small cell lung cancer, adenocarcinoma presented with right upper lobe lung nodule in addition to right hilar and mediastinal lymphadenopathy as well as metastatic liver and bone disease diagnosed in February 2019.  Biomarker Findings Microsatellite status - MS-Stable Tumor Mutational Burden - TMB-Low (4 Muts/Mb) Genomic Findings For a complete list of the genes assayed, please refer to the Appendix. EGFR exon 20 insertion ([id number]) RB1 loss exons 9-17 TP53 [id number] 7 Disease relevant genes with no reportable alterations: KRAS, ALK, BRAF, MET, RET, ERBB2, ROS1  PDL 1 expression 5%  PRIOR THERAPY:  1) Palliative radiotherapy to the metastatic bone lesions in the cervical spine and left femur under the care of Dr. Sondra Come. 2) Systemic chemotherapy with carboplatin for AUC of 5, Alimta 500 mg/M2 and Keytruda 200 mg IV every 3 weeks.  First dose October 04, 2017.  Status post 3 cycles. 3) palliative stereotactic radiotherapy to the progressive liver lesion. 4)  Maintenance treatment with Alimta 500 mg/M2 and Keytruda 200 mg IV every 3 weeks.  Status post 12 cycles.  Last dose was giving August 14, 2018 discontinued secondary to disease progression.  CURRENT THERAPY: Systemic chemotherapy with docetaxel 75 mg/M2 and Cyramza 10 mg/KG every 3 weeks with Neulasta support.  First dose September 11, 2018.  INTERVAL HISTORY: Anthony [name] 48 y.o. male returns to the clinic today for follow-up visit accompanied by friend.  The patient is feeling fine today except for increasing fatigue and weakness as well as low back pain.  He denied having any current chest pain, shortness of breath except with exertion with no cough or hemoptysis.  He has no nausea, vomiting, diarrhea or  constipation but has intermittent right upper quadrant abdominal pain.  He has no headache or visual changes.  He has been tolerating his treatment with maintenance Alimta and Keytruda fairly well.  He had MRI of the lumbar spine that showed progressive bone metastasis.  The patient also had repeat CT scan of the chest, abdomen and pelvis performed recently and he is here for evaluation and discussion of his scan results.  MEDICAL HISTORY: Past Medical History:  Diagnosis Date  . Acid reflux   . History of radiation therapy 09/21/17-10/04/17   C4 spine 30 Gy in 10 fractions, left femur 30 Gy in 10 fractions  . Non-small cell lung cancer ([facility])   . NSTEMI (non-ST elevated myocardial infarction) ([facility]) 06/13/2017   [name] 06/14/2017  . Tailbone injury since 1993   "cracked"    ALLERGIES:  has No Known Allergies.  MEDICATIONS:  Current Outpatient Medications  Medication Sig Dispense Refill  . acetaminophen (TYLENOL) 500 MG tablet Take 500-1,000 mg by mouth daily as needed for moderate pain or headache.    Marland Kitchen aspirin EC 81 MG EC tablet Take 1 tablet (81 mg total) by mouth daily.    Marland Kitchen atorvastatin (LIPITOR) 40 MG tablet Take 1 tablet (40 mg total) by mouth daily. 90 tablet 3  . cetirizine (ZYRTEC) 10 MG tablet Take 10 mg by mouth daily as needed for allergies.     . chlorproMAZINE (THORAZINE) 25 MG tablet Take 1 tablet (25 mg total) by mouth 3 (three) times daily as needed. 30 tablet 0  . esomeprazole (NEXIUM) 20 MG capsule Take 20 mg by mouth every other day.    Marland Kitchen  folic acid (FOLVITE) 1 MG tablet Take 1 tablet (1 mg total) by mouth daily. 30 tablet 2  . lisinopril (PRINIVIL,ZESTRIL) 5 MG tablet TAKE 1 TABLET BY MOUTH EVERY DAY 90 tablet 3  . metoprolol tartrate (LOPRESSOR) 25 MG tablet Take 12.5 mg by mouth 2 (two) times daily.    . prochlorperazine (COMPAZINE) 10 MG tablet Take 1 tablet (10 mg total) by mouth every 6 (six) hours as needed for nausea or vomiting. 30 tablet 1  . mirtazapine  (REMERON) 30 MG tablet Take 1 tablet (30 mg total) by mouth at bedtime. (Patient not taking: Reported on 08/30/2018) 30 tablet 2   No current facility-administered medications for this visit.     SURGICAL HISTORY:  Past Surgical History:  Procedure Laterality Date  . CARDIAC CATHETERIZATION  06/14/2017  . CHOLECYSTECTOMY N/A 12/29/2017   Procedure: LAPAROSCOPIC CHOLECYSTECTOMY;  Surgeon: Ralene Ok, MD;  Location: [facility];  Service: General;  Laterality: N/A;  . CHOLECYSTECTOMY, LAPAROSCOPIC N/A 12/29/2017  . CORONARY ARTERY BYPASS GRAFT N/A 06/19/2017   Procedure: CORONARY ARTERY BYPASS GRAFTING (CABG), OFF PUMP, times one using the left internal mammary artery to LAD;  Surgeon: Grace Isaac, MD;  Location: [facility];  Service: Open Heart Surgery;  Laterality: N/A;  . ESOPHAGOGASTRODUODENOSCOPY (EGD) WITH PROPOFOL N/A 11/30/2017   Procedure: ESOPHAGOGASTRODUODENOSCOPY (EGD) WITH PROPOFOL;  Surgeon: Gatha Mayer, MD;  Location: [facility];  Service: Endoscopy;  Laterality: N/A;  . LEFT HEART CATH AND CORONARY ANGIOGRAPHY N/A 06/14/2017   Procedure: LEFT HEART CATH AND CORONARY ANGIOGRAPHY;  Surgeon: Sherren Mocha, MD;  Location: [facility];  Service: Cardiovascular;  Laterality: N/A;  . TEE WITHOUT CARDIOVERSION N/A 06/19/2017   Procedure: TRANSESOPHAGEAL ECHOCARDIOGRAM (TEE);  Surgeon: Grace Isaac, MD;  Location: [facility];  Service: Open Heart Surgery;  Laterality: N/A;  . TONSILLECTOMY  ~ 1977    REVIEW OF SYSTEMS:  Constitutional: positive for fatigue and weight loss Eyes: negative Ears, nose, mouth, throat, and face: negative Respiratory: positive for dyspnea on exertion Cardiovascular: negative Gastrointestinal: negative Genitourinary:negative Integument/breast: negative Hematologic/lymphatic: negative Musculoskeletal:positive for back pain and muscle weakness Neurological: negative Behavioral/Psych: negative Endocrine: negative Allergic/Immunologic:  negative   PHYSICAL EXAMINATION: General appearance: alert, cooperative, fatigued and no distress Head: Normocephalic, without obvious abnormality, atraumatic Neck: no adenopathy, no JVD, supple, symmetrical, trachea midline and thyroid not enlarged, symmetric, no tenderness/mass/nodules Lymph nodes: Cervical, supraclavicular, and axillary nodes normal. Resp: clear to auscultation bilaterally Back: symmetric, no curvature. ROM normal. No CVA tenderness. Cardio: regular rate and rhythm, S1, S2 normal, no murmur, click, rub or gallop GI: soft, non-tender; bowel sounds normal; no masses,  no organomegaly Extremities: extremities normal, atraumatic, no cyanosis or edema Neurologic: Alert and oriented X 3, normal strength and tone. Normal symmetric reflexes. Normal coordination and gait  ECOG PERFORMANCE STATUS: 1 - Symptomatic but completely ambulatory  Blood pressure 126/86, pulse (!) 105, temperature 99.3 F (37.4 C), temperature source Oral, resp. rate 18, height '5\' 10"'$  (1.778 m), weight 214 lb 14.4 oz (97.5 kg), SpO2 98 %.  LABORATORY DATA: Lab Results  Component Value Date   WBC 6.0 08/30/2018   HGB 10.3 (L) 08/30/2018   HCT 32.0 (L) 08/30/2018   MCV 93.6 08/30/2018   PLT 417 (H) 08/30/2018      Chemistry      Component Value Date/Time   NA 139 08/30/2018 0958   NA 141 07/17/2017 1006   K 4.2 08/30/2018 0958   CL 102 08/30/2018 0958   CO2  28 08/30/2018 0958   BUN 13 08/30/2018 0958   BUN 12 07/17/2017 1006   CREATININE 1.18 08/30/2018 0958      Component Value Date/Time   CALCIUM 9.8 08/30/2018 0958   ALKPHOS 106 08/30/2018 0958   AST 26 08/30/2018 0958   ALT 29 08/30/2018 0958   BILITOT 0.4 08/30/2018 0958       RADIOGRAPHIC STUDIES: Dg Abd 1 View  Result Date: 08/30/2018 CLINICAL DATA:  Lower back and abdomen pain. EXAM: ABDOMEN - 1 VIEW COMPARISON:  None. FINDINGS: The bowel gas pattern is normal. Moderate bowel content is identified throughout colon. No  radio-opaque calculi or other significant radiographic abnormality are seen. IMPRESSION: No bowel obstruction. Moderate bowel content identified throughout colon, this can be seen in constipation. Electronically Signed   By: Abelardo Diesel M.D.   On: 08/30/2018 15:21   Ct Chest W Contrast  Result Date: 09/03/2018 CLINICAL DATA:  Restaging lung cancer. EXAM: CT CHEST, ABDOMEN, AND PELVIS WITH CONTRAST TECHNIQUE: Multidetector CT imaging of the chest, abdomen and pelvis was performed following the standard protocol during bolus administration of intravenous contrast. CONTRAST:  148m OMNIPAQUE IOHEXOL 300 MG/ML  SOLN COMPARISON:  07/02/2018 FINDINGS: CT CHEST FINDINGS Cardiovascular: The heart size appears normal. Previous median sternotomy and CABG procedure. No pericardial effusion. Mediastinum/Nodes: Normal appearance of the thyroid gland. The trachea appears patent and is midline. Normal appearance of the esophagus. No supraclavicular or axillary adenopathy. There are 2 new right CP angle lymph nodes which measure up to 1.6 cm, image 37/2. At the level of the hiatus there is a new lymph node anterior to the esophagus and medial to the inferior cavoatrial junction measuring 1.5 cm, image 42/2. Lungs/Pleura: Increase in volume of right pleural effusion. The dominant mass at the right lung base demonstrates significant interval increase in size in the interval and now involves the diaphragm and dome of liver. This measures 8.5 by 5.7 by 6.4 cm, image 43/2 and image 102/4. Previously 5.0 x 1.3 by 1.2 cm. Multiple new pleural nodules are identified within the right hemithorax. The largest is in the posterior costophrenic angle measuring 4.1 by 2.6 by 3.5 cm and exhibits chest wall extension. Perifissural lesion within the anteromedial aspect of the base of right upper lobe measures 1.6 x 1.2 cm, image 69/4. Previously 1.1 x 1.3 cm. Musculoskeletal: New lucent lesion within T1 vertebra measures 7 mm, image 15/4. The  new lucent lesion within T6 vertebra measures 1.2 cm, image 16/6. Subtle increase sclerosis within the T4 vertebra noted, suspicious for metastasis, image 120/6. Very subtle lucent lesion within the body of sternum measures 1.5 cm, image 114/6. Previously 1.2 cm. CT ABDOMEN PELVIS FINDINGS Hepatobiliary: New lesion within segment 4a measures 1.8 cm. The previously noted segment 4a lesion measures 2.4 cm, image 49/2. Previously 1.4 cm. New segment 6 lesion measures 0.7 cm, image 70/2. Previous cholecystectomy. Pancreas: Unremarkable. No pancreatic ductal dilatation or surrounding inflammatory changes. Spleen: Normal in size without focal abnormality. Adrenals/Urinary Tract: Normal adrenal glands. There is a subtle striated nephrographic appearance of both kidneys. No distinct mass or hydronephrosis identified. Urinary bladder appears normal. Stomach/Bowel: Stomach is within normal limits. Appendix appears normal. No evidence of bowel wall thickening, distention, or inflammatory changes. Numerous colonic diverticula identified without acute inflammation. Vascular/Lymphatic: Aortic atherosclerosis. No aneurysm. No abdominopelvic adenopathy. Reproductive: Prostate is unremarkable. Other: No free fluid or fluid collections. Musculoskeletal: There is a lytic lesion involving the superior endplate of the L2 vertebral body which appears new from  previous exam. Also new is a lucent lesion involving the L4 vertebral body, image 107/6. IMPRESSION: 1. Interval progression of disease. There has been considerable increase in size right lung base mass which now invades the right hemidiaphragm and dome of liver. The right pleural effusion is increased in size and there are multiple new pleural nodules throughout the right hemithorax. New right CP angle and posterior mediastinal adenopathy. Progression of liver metastasis. New multifocal lytic bone metastases. 2. Subtle striated nephrographic appearance of both kidneys which is  nonspecific but may be associated with pyelonephritis as well as embolic disease. Metastasis to the kidneys is considered less favored. Electronically Signed   By: Kerby Moors M.D.   On: 09/03/2018 09:15   Mr Lumbar Spine W Wo Contrast  Result Date: 08/30/2018 CLINICAL DATA:  Metastatic non-small cell lung cancer with progressive constipation and urinary hesitancy. Back pain EXAM: MRI LUMBAR SPINE WITHOUT AND WITH CONTRAST TECHNIQUE: Multiplanar and multiecho pulse sequences of the lumbar spine were obtained without and with intravenous contrast. CONTRAST:  10 mL Gadovist IV COMPARISON:  CT abdomen pelvis 07/02/2018 FINDINGS: Segmentation:  Normal Alignment:  Normal Vertebrae: Multiple enhancing metastatic deposits throughout the lumbar spine. Metastatic disease also in the sacrum and T11 and T12 vertebral body. These are not definitely seen on the prior CT and likely have progressed in the interval. Negative for fracture. Conus medullaris and cauda equina: Conus extends to the L1-2 level. Conus and cauda equina appear normal. Paraspinal and other soft tissues: Expansile mass in the region of the right twelfth rib compatible with metastatic disease. This is not seen on the prior CT. Paraspinous soft tissue mass to the right of L5 measures 10 x 20 mm compatible with metastatic disease extension from the L5 vertebral body. This was not present on the prior CT. Disc levels: Small central disc protrusion at L5-S1. No significant spinal stenosis in the lumbar spine. IMPRESSION: Multiple metastatic deposits throughout the lower thoracic and lumbar spine with progression from the CT of 07/02/2018. No fracture or epidural tumor. Paraspinous soft tissue mass due to tumor extension to the right of L5. Metastatic expansile lesion to the right twelfth rib not seen on the prior study. Electronically Signed   By: Franchot Gallo M.D.   On: 08/30/2018 16:52   Ct Abdomen Pelvis W Contrast  Result Date: 09/03/2018 CLINICAL  DATA:  Restaging lung cancer. EXAM: CT CHEST, ABDOMEN, AND PELVIS WITH CONTRAST TECHNIQUE: Multidetector CT imaging of the chest, abdomen and pelvis was performed following the standard protocol during bolus administration of intravenous contrast. CONTRAST:  169m OMNIPAQUE IOHEXOL 300 MG/ML  SOLN COMPARISON:  07/02/2018 FINDINGS: CT CHEST FINDINGS Cardiovascular: The heart size appears normal. Previous median sternotomy and CABG procedure. No pericardial effusion. Mediastinum/Nodes: Normal appearance of the thyroid gland. The trachea appears patent and is midline. Normal appearance of the esophagus. No supraclavicular or axillary adenopathy. There are 2 new right CP angle lymph nodes which measure up to 1.6 cm, image 37/2. At the level of the hiatus there is a new lymph node anterior to the esophagus and medial to the inferior cavoatrial junction measuring 1.5 cm, image 42/2. Lungs/Pleura: Increase in volume of right pleural effusion. The dominant mass at the right lung base demonstrates significant interval increase in size in the interval and now involves the diaphragm and dome of liver. This measures 8.5 by 5.7 by 6.4 cm, image 43/2 and image 102/4. Previously 5.0 x 1.3 by 1.2 cm. Multiple new pleural nodules are identified within  the right hemithorax. The largest is in the posterior costophrenic angle measuring 4.1 by 2.6 by 3.5 cm and exhibits chest wall extension. Perifissural lesion within the anteromedial aspect of the base of right upper lobe measures 1.6 x 1.2 cm, image 69/4. Previously 1.1 x 1.3 cm. Musculoskeletal: New lucent lesion within T1 vertebra measures 7 mm, image 15/4. The new lucent lesion within T6 vertebra measures 1.2 cm, image 16/6. Subtle increase sclerosis within the T4 vertebra noted, suspicious for metastasis, image 120/6. Very subtle lucent lesion within the body of sternum measures 1.5 cm, image 114/6. Previously 1.2 cm. CT ABDOMEN PELVIS FINDINGS Hepatobiliary: New lesion within  segment 4a measures 1.8 cm. The previously noted segment 4a lesion measures 2.4 cm, image 49/2. Previously 1.4 cm. New segment 6 lesion measures 0.7 cm, image 70/2. Previous cholecystectomy. Pancreas: Unremarkable. No pancreatic ductal dilatation or surrounding inflammatory changes. Spleen: Normal in size without focal abnormality. Adrenals/Urinary Tract: Normal adrenal glands. There is a subtle striated nephrographic appearance of both kidneys. No distinct mass or hydronephrosis identified. Urinary bladder appears normal. Stomach/Bowel: Stomach is within normal limits. Appendix appears normal. No evidence of bowel wall thickening, distention, or inflammatory changes. Numerous colonic diverticula identified without acute inflammation. Vascular/Lymphatic: Aortic atherosclerosis. No aneurysm. No abdominopelvic adenopathy. Reproductive: Prostate is unremarkable. Other: No free fluid or fluid collections. Musculoskeletal: There is a lytic lesion involving the superior endplate of the L2 vertebral body which appears new from previous exam. Also new is a lucent lesion involving the L4 vertebral body, image 107/6. IMPRESSION: 1. Interval progression of disease. There has been considerable increase in size right lung base mass which now invades the right hemidiaphragm and dome of liver. The right pleural effusion is increased in size and there are multiple new pleural nodules throughout the right hemithorax. New right CP angle and posterior mediastinal adenopathy. Progression of liver metastasis. New multifocal lytic bone metastases. 2. Subtle striated nephrographic appearance of both kidneys which is nonspecific but may be associated with pyelonephritis as well as embolic disease. Metastasis to the kidneys is considered less favored. Electronically Signed   By: Kerby Moors M.D.   On: 09/03/2018 09:15   Dg Hip Unilat W Or W/o Pelvis 1 View Right  Result Date: 08/30/2018 CLINICAL DATA:  Pelvic pain.  Stage IV lung  cancer. EXAM: DG HIP (WITH OR WITHOUT PELVIS) 1V RIGHT COMPARISON:  None. FINDINGS: There is no evidence of hip fracture or dislocation. There is no evidence of arthropathy or other focal bone abnormality. IMPRESSION: No acute abnormality. Electronically Signed   By: Abelardo Diesel M.D.   On: 08/30/2018 15:24    ASSESSMENT AND PLAN: This is a very pleasant 48 years old white male with stage IV non-small cell lung cancer, adenocarcinoma with resistant EGFR mutation in exon 20 and PDL 1 expression of 5%. The patient completed a course of palliative radiotherapy to the cervical spine as well as left hip under the care of Dr. Sondra Come.Marland Kitchen He completed induction systemic chemotherapy with carboplatin, Alimta and Keytruda status post 3 cycles.  He has no evidence for disease progression after the induction phase of his treatment. The patient was started on maintenance treatment with Alimta and Ketruda (pembrolizumab) status post 12 cycles.  He has been tolerating this treatment well with no concerning complaints.  He had repeat CT scan of the chest, abdomen and pelvis performed recently.  I personally and independently reviewed the scan images and discussed the results with the patient and his friend today. Unfortunately his  scan showed significant evidence for disease progression with enlargement of right lung mass as well as pulmonary nodules in addition to mediastinal lymphadenopathy as well as liver and bone disease. I had a lengthy discussion with the patient today about his current condition and treatment options.  I gave the patient the option of palliative care versus consideration of palliative systemic chemotherapy with second line treatment with docetaxel 75 mg/M2 and Cyramza 10 mg/KG every 3 weeks with Neulasta support. The patient is interested in proceeding with systemic chemotherapy and he is expected to start the first dose of this treatment next week. I discussed with him the adverse effect of this  treatment including but not limited to alopecia, myelosuppression, nausea and vomiting, peripheral neuropathy, liver or renal dysfunction as well as nail changes and bleeding issues with Cyramza. For the metastatic disease in the back, I will refer the patient to Dr. Tammi Klippel for evaluation and consideration of palliative radiotherapy to that area. For pain management, I will start the patient on Percocet 5/325 mg p.o. every 6 hours as needed for pain in addition to Flexeril for the muscle spasm. I also ordered Decadron 8 mg p.o. twice daily the day before, day of and day after the chemotherapy. He will come back for follow-up visit in 4 weeks for evaluation with the start of cycle #2. The patient was advised to call immediately if he has any concerning symptoms in the interval. The patient voices understanding of current disease status and treatment options and is in agreement with the current care plan.  All questions were answered. The patient knows to call the clinic with any problems, questions or concerns. We can certainly see the patient much sooner if necessary.  Disclaimer: This note was dictated with voice recognition software. Similar sounding words can inadvertently be transcribed and may not be corrected upon review.

## 2018-09-04 NOTE — Progress Notes (Signed)
DISCONTINUE ON PATHWAY REGIMEN - Non-Small Cell Lung     A cycle is every 21 days:     Pembrolizumab      Pemetrexed      Carboplatin   **Always confirm dose/schedule in your pharmacy ordering system**  REASON: Disease Progression PRIOR TREATMENT: [id number]: Pembrolizumab 200 mg + Pemetrexed 500 mg/m2 + Carboplatin AUC=5 q21 Days x 4 Cycles TREATMENT RESPONSE: Progressive Disease (PD)  START ON PATHWAY REGIMEN - Non-Small Cell Lung     A cycle is every 21 days:     Ramucirumab      Docetaxel   **Always confirm dose/schedule in your pharmacy ordering system**  Patient Characteristics: Stage IV Metastatic, Nonsquamous, Second Line - Chemotherapy/Immunotherapy, PS = 0, 1, No Prior PD-1/PD-L1  Inhibitor or Prior PD-1/PD-L1 Inhibitor + Chemotherapy, and Not a Candidate for Immunotherapy AJCC T Category: T1b Current Disease Status: Distant Metastases AJCC N Category: N2 AJCC M Category: M1c AJCC 8 Stage Grouping: IVB Histology: Nonsquamous Cell ROS1 Rearrangement Status: Awaiting Test Results T790M Mutation Status: Not Applicable - EGFR Mutation Negative/Unknown Other Mutations/Biomarkers: No Other Actionable Mutations NTRK Gene Fusion Status: Negative PD-L1 Expression Status: PD-L1 Positive 1-49% (TPS) Chemotherapy/Immunotherapy LOT: Second Line Chemotherapy/Immunotherapy Molecular Targeted Therapy: Not Appropriate ALK Translocation Status: Awaiting Test Results EGFR Mutation Status: Awaiting Test Results BRAF V600E Mutation Status: Awaiting Test Results Performance Status: PS = 0, 1 Immunotherapy Candidate Status: Not a Candidate for Immunotherapy Prior Immunotherapy Status: Prior PD-1/PD-L1 Inhibitor + Chemotherapy Intent of Therapy: Non-Curative / Palliative Intent, Discussed with Patient

## 2018-09-04 NOTE — Progress Notes (Signed)
[facility] Progress Note   Lonell Stamos Branan [id number] 02/12/1971 48 y.o.  Truddie Crumble Blackerby is managed by Dr. Fanny Bien. Mohamed  Actively treated with chemotherapy/immunotherapy/hormonal therapy: yes  Current Therapy: Keytruda and Alimta  Last Treated: 08/14/2018 (cycle 15, day 1)  Assessment: Plan:    Bone metastasis ([place]) - Plan: MR Lumbar Spine W Wo Contrast  Stage 4 lung cancer, right ([place]) - Plan: DG Abd 1 View, DG Hip Unilat W or W/O Pelvis 1 View Right, MR Lumbar Spine W Wo Contrast  Urinary hesitancy - Plan: MR Lumbar Spine W Wo Contrast  Obstipation - Plan: DG Abd 1 View, MR Lumbar Spine W Wo Contrast   Stage IVB non-small cell lung cancer, adenocarcinoma of the right upper lobe with right hilar and mediastinal lymphadenopathy as well as hepatic and bony metastatic disease: Mr. Icenogle presents to the office today with new lower back pain with radiation to the right posterior hip.  He has a history of constipation which has become progressively worse over the last 10 days to 2 weeks.  He additionally has developed new onset urinary hesitancy.  He has been referred for a KUB, right pelvis and hip x-ray and an MRI of the lumbar spine.  His last CT scan of the chest abdomen pelvis was completed on 07/02/2018.  He is being referred for restaging CT scans of the chest abdomen and pelvis due to concern that the patient may have progression of his cancer.   Please see After Visit Summary for patient specific instructions.  Future Appointments  Date Time Provider [place]  09/04/2018 10:15 AM [name], MD [facility] None  09/04/2018 11:15 AM [facility] IN[facility]ION [facility] None  09/25/2018 10:15 AM [facility] LAB 1 [facility] None  09/25/2018 10:45 AM [name], MD [facility] None  09/25/2018 11:30 AM [facility] INFUSION [facility] None  10/16/2018  9:15 AM [facility] LAB 4 [facility] None  10/16/2018  9:45 AM [name], MD [facility] None    10/16/2018 10:30 AM [facility] INFUSION [facility] None  11/06/2018  9:30 AM [facility] LAB 2 [facility] None  11/06/2018 10:00 AM [name], MD [facility] None  11/06/2018 11:00 AM [facility] INFUSION [facility] None    Orders Placed This Encounter  Procedures  . DG Abd 1 View  . DG Hip Unilat W or W/O Pelvis 1 View Right  . MR Lumbar Spine W Wo Contrast       Subjective:   Patient ID:  Thor Nannini Petrea is a 48 y.o. (DOB [birth year]) male.  Chief Complaint:  Chief Complaint  Patient presents with  . Constipation    HPI Freddy Kinne Hornung   is a 48 year old male with a history of a stage IVB non-small cell lung cancer, adenocarcinoma of the right upper lobe with right hilar and mediastinal lymphadenopathy as well as hepatic and bony metastatic disease.  He is managed by Dr. Fanny Bien. Mohamed and has most recently been treated with Alimta and Keytruda with cycle 15, day 1 last dosed on 08/14/2018.  He presents to the office today with progressive lumbar and right hip pain over the past 2 weeks.  He states that it feels as though he is pulled a muscle in his lower back.  He has had no change in activity and has had no trauma.  He has had a history of ongoing constipation which has been worse over the last 2 weeks.  He has been using laxatives and suppositories with minimal benefit.  He had a small  bowel movement today but reports that he continues to have a sensation that he could have an intestinal blockage.  Of new onset is urinary hesitancy.  It is slow when he begins to attempt to void.  He states that he feels that his bladder does not completely empty.  He denies fevers, chills, sweats, nausea or vomiting.  Medications: I have reviewed the patient's current medications.  Allergies: No Known Allergies  Past Medical History:  Diagnosis Date  . Acid reflux   . History of radiation therapy 09/21/17-10/04/17   C4 spine 30 Gy in 10 fractions, left femur 30 Gy in 10 fractions  .  Non-small cell lung cancer ([place])   . NSTEMI (non-ST elevated myocardial infarction) ([place]) 06/13/2017   [name] 06/14/2017  . Tailbone injury since 1993   "cracked"    Past Surgical History:  Procedure Laterality Date  . CARDIAC CATHETERIZATION  06/14/2017  . CHOLECYSTECTOMY N/A 12/29/2017   Procedure: LAPAROSCOPIC CHOLECYSTECTOMY;  Surgeon: Ralene Ok, MD;  Location: [facility];  Service: General;  Laterality: N/A;  . CHOLECYSTECTOMY, LAPAROSCOPIC N/A 12/29/2017  . CORONARY ARTERY BYPASS GRAFT N/A 06/19/2017   Procedure: CORONARY ARTERY BYPASS GRAFTING (CABG), OFF PUMP, times one using the left internal mammary artery to LAD;  Surgeon: Grace Isaac, MD;  Location: [facility];  Service: Open Heart Surgery;  Laterality: N/A;  . ESOPHAGOGASTRODUODENOSCOPY (EGD) WITH PROPOFOL N/A 11/30/2017   Procedure: ESOPHAGOGASTRODUODENOSCOPY (EGD) WITH PROPOFOL;  Surgeon: Gatha Mayer, MD;  Location: [facility];  Service: Endoscopy;  Laterality: N/A;  . LEFT HEART CATH AND CORONARY ANGIOGRAPHY N/A 06/14/2017   Procedure: LEFT HEART CATH AND CORONARY ANGIOGRAPHY;  Surgeon: Sherren Mocha, MD;  Location: [facility];  Service: Cardiovascular;  Laterality: N/A;  . TEE WITHOUT CARDIOVERSION N/A 06/19/2017   Procedure: TRANSESOPHAGEAL ECHOCARDIOGRAM (TEE);  Surgeon: Grace Isaac, MD;  Location: [facility];  Service: Open Heart Surgery;  Laterality: N/A;  . TONSILLECTOMY  ~ 1977    Family History  Problem Relation Age of Onset  . Alcohol abuse Mother   . Heart disease Father   . Arrhythmia Father   . Aneurysm Maternal Grandmother     Social History   Socioeconomic History  . Marital status: Divorced    Spouse name: Not on file  . Number of children: Not on file  . Years of education: Not on file  . Highest education level: Not on file  Occupational History  . Occupation: Scientist, clinical (histocompatibility and immunogenetics): [employer]  . Financial resource strain: Not on file  . Food insecurity:     Worry: Not on file    Inability: Not on file  . Transportation needs:    Medical: Not on file    Non-medical: Not on file  Tobacco Use  . Smoking status: Never Smoker  . Smokeless tobacco: Former Systems developer    Types: Snuff  Substance and Sexual Activity  . Alcohol use: Yes    Comment: 06/16/2017 "used to drink alot; pretty much stopped~ 01/2015; will have a couple drinks/month now"  . Drug use: No    Comment: 06/14/2017 "nothing since 2000"  . Sexual activity: Yes  Lifestyle  . Physical activity:    Days per week: Not on file    Minutes per session: Not on file  . Stress: Not on file  Relationships  . Social connections:    Talks on phone: Not on file    Gets together: Not on file  Attends religious service: Not on file    Active member of club or organization: Not on file    Attends meetings of clubs or organizations: Not on file    Relationship status: Not on file  . Intimate partner violence:    Fear of current or ex partner: Not on file    Emotionally abused: Not on file    Physically abused: Not on file    Forced sexual activity: Not on file  Other Topics Concern  . Not on file  Social History Narrative   Divorced   Tool Salesman   + EtOH   Never smoker    Past Medical History, Surgical history, Social history, and Family history were reviewed and updated as appropriate.   Please see review of systems for further details on the patient's review from today.   Review of Systems:  Review of Systems  Constitutional: Negative for appetite change, chills, diaphoresis, fatigue, fever and unexpected weight change.  HENT: Negative for congestion, postnasal drip, rhinorrhea, sore throat and trouble swallowing.   Respiratory: Negative for cough, shortness of breath and wheezing.   Cardiovascular: Negative for palpitations.  Gastrointestinal: Positive for abdominal pain and constipation. Negative for abdominal distention, anal bleeding, blood in stool, diarrhea, nausea, rectal  pain and vomiting.  Genitourinary: Positive for flank pain (Left flank pain). Negative for dysuria.       New onset urinary hesitancy with a sensation of incomplete voiding.  Musculoskeletal: Positive for back pain. Negative for arthralgias and gait problem.  Neurological: Negative for headaches.    Objective:   Physical Exam:  BP 119/81 (BP Location: Left Arm, Patient Position: Sitting)   Pulse (!) 107   Temp 98.4 F (36.9 C) (Oral)   Resp 18   Ht 5\' 10"  (1.778 m)   Wt 214 lb 1.6 oz (97.1 kg)   SpO2 100%   BMI 30.72 kg/m  ECOG: 1  Physical Exam Constitutional:      General: He is not in acute distress.    Appearance: He is not diaphoretic.  HENT:     Head: Normocephalic and atraumatic.     Mouth/Throat:     Mouth: Mucous membranes are moist.     Pharynx: No oropharyngeal exudate or posterior oropharyngeal erythema.  Cardiovascular:     Rate and Rhythm: Normal rate and regular rhythm.     Heart sounds: Normal heart sounds. No murmur. No friction rub. No gallop.   Pulmonary:     Effort: Pulmonary effort is normal. No respiratory distress.     Breath sounds: Normal breath sounds. No wheezing or rales.  Abdominal:     General: Bowel sounds are normal. There is no distension.     Palpations: Abdomen is soft. There is no mass.     Tenderness: There is no abdominal tenderness. There is no guarding or rebound.  Musculoskeletal:        General: Tenderness (Point tenderness along the lumbar spine with left CVA tenderness.) present.  Skin:    General: Skin is warm and dry.  Neurological:     Mental Status: He is alert.     Motor: No weakness.     Coordination: Coordination normal.     Deep Tendon Reflexes: Reflexes normal.  Psychiatric:        Mood and Affect: Mood normal.        Behavior: Behavior normal.        Thought Content: Thought content normal.        Judgment:  Judgment normal.     Lab Review:     Component Value Date/Time   NA 139 08/30/2018 0958   NA 141  07/17/2017 1006   K 4.2 08/30/2018 0958   CL 102 08/30/2018 0958   CO2 28 08/30/2018 0958   GLUCOSE 120 (H) 08/30/2018 0958   BUN 13 08/30/2018 0958   BUN 12 07/17/2017 1006   CREATININE 1.18 08/30/2018 0958   CALCIUM 9.8 08/30/2018 0958   PROT 7.4 08/30/2018 0958   PROT 6.2 02/02/2018 0825   ALBUMIN 3.0 (L) 08/30/2018 0958   ALBUMIN 4.1 02/02/2018 0825   AST 26 08/30/2018 0958   ALT 29 08/30/2018 0958   ALKPHOS 106 08/30/2018 0958   BILITOT 0.4 08/30/2018 0958   GFRNONAA >60 08/30/2018 0958   GFRAA >60 08/30/2018 0958       Component Value Date/Time   WBC 6.0 08/30/2018 0958   WBC 4.1 12/01/2017 0937   RBC 3.42 (L) 08/30/2018 0958   HGB 10.3 (L) 08/30/2018 0958   HGB 14.7 07/17/2017 1006   HCT 32.0 (L) 08/30/2018 0958   HCT 42.6 07/17/2017 1006   PLT 417 (H) 08/30/2018 0958   PLT 334 07/17/2017 1006   MCV 93.6 08/30/2018 0958   MCV 89 07/17/2017 1006   MCH 30.1 08/30/2018 0958   MCHC 32.2 08/30/2018 0958   RDW 14.4 08/30/2018 0958   RDW 13.6 07/17/2017 1006   LYMPHSABS 1.0 08/30/2018 0958   LYMPHSABS 2.1 07/17/2017 1006   MONOABS 0.7 08/30/2018 0958   EOSABS 0.1 08/30/2018 0958   EOSABS 0.2 07/17/2017 1006   BASOSABS 0.0 08/30/2018 0958   BASOSABS 0.1 07/17/2017 1006   -------------------------------  Imaging from last 24 hours (if applicable):  Radiology interpretation: Dg Abd 1 View  Result Date: 08/30/2018 CLINICAL DATA:  Lower back and abdomen pain. EXAM: ABDOMEN - 1 VIEW COMPARISON:  None. FINDINGS: The bowel gas pattern is normal. Moderate bowel content is identified throughout colon. No radio-opaque calculi or other significant radiographic abnormality are seen. IMPRESSION: No bowel obstruction. Moderate bowel content identified throughout colon, this can be seen in constipation. Electronically Signed   By: Abelardo Diesel M.D.   On: 08/30/2018 15:21   Ct Chest W Contrast  Result Date: 09/03/2018 CLINICAL DATA:  Restaging lung cancer. EXAM: CT CHEST,  ABDOMEN, AND PELVIS WITH CONTRAST TECHNIQUE: Multidetector CT imaging of the chest, abdomen and pelvis was performed following the standard protocol during bolus administration of intravenous contrast. CONTRAST:  184mL OMNIPAQUE IOHEXOL 300 MG/ML  SOLN COMPARISON:  07/02/2018 FINDINGS: CT CHEST FINDINGS Cardiovascular: The heart size appears normal. Previous median sternotomy and CABG procedure. No pericardial effusion. Mediastinum/Nodes: Normal appearance of the thyroid gland. The trachea appears patent and is midline. Normal appearance of the esophagus. No supraclavicular or axillary adenopathy. There are 2 new right CP angle lymph nodes which measure up to 1.6 cm, image 37/2. At the level of the hiatus there is a new lymph node anterior to the esophagus and medial to the inferior cavoatrial junction measuring 1.5 cm, image 42/2. Lungs/Pleura: Increase in volume of right pleural effusion. The dominant mass at the right lung base demonstrates significant interval increase in size in the interval and now involves the diaphragm and dome of liver. This measures 8.5 by 5.7 by 6.4 cm, image 43/2 and image 102/4. Previously 5.0 x 1.3 by 1.2 cm. Multiple new pleural nodules are identified within the right hemithorax. The largest is in the posterior costophrenic angle measuring 4.1 by 2.6 by 3.5  cm and exhibits chest wall extension. Perifissural lesion within the anteromedial aspect of the base of right upper lobe measures 1.6 x 1.2 cm, image 69/4. Previously 1.1 x 1.3 cm. Musculoskeletal: New lucent lesion within T1 vertebra measures 7 mm, image 15/4. The new lucent lesion within T6 vertebra measures 1.2 cm, image 16/6. Subtle increase sclerosis within the T4 vertebra noted, suspicious for metastasis, image 120/6. Very subtle lucent lesion within the body of sternum measures 1.5 cm, image 114/6. Previously 1.2 cm. CT ABDOMEN PELVIS FINDINGS Hepatobiliary: New lesion within segment 4a measures 1.8 cm. The previously noted  segment 4a lesion measures 2.4 cm, image 49/2. Previously 1.4 cm. New segment 6 lesion measures 0.7 cm, image 70/2. Previous cholecystectomy. Pancreas: Unremarkable. No pancreatic ductal dilatation or surrounding inflammatory changes. Spleen: Normal in size without focal abnormality. Adrenals/Urinary Tract: Normal adrenal glands. There is a subtle striated nephrographic appearance of both kidneys. No distinct mass or hydronephrosis identified. Urinary bladder appears normal. Stomach/Bowel: Stomach is within normal limits. Appendix appears normal. No evidence of bowel wall thickening, distention, or inflammatory changes. Numerous colonic diverticula identified without acute inflammation. Vascular/Lymphatic: Aortic atherosclerosis. No aneurysm. No abdominopelvic adenopathy. Reproductive: Prostate is unremarkable. Other: No free fluid or fluid collections. Musculoskeletal: There is a lytic lesion involving the superior endplate of the L2 vertebral body which appears new from previous exam. Also new is a lucent lesion involving the L4 vertebral body, image 107/6. IMPRESSION: 1. Interval progression of disease. There has been considerable increase in size right lung base mass which now invades the right hemidiaphragm and dome of liver. The right pleural effusion is increased in size and there are multiple new pleural nodules throughout the right hemithorax. New right CP angle and posterior mediastinal adenopathy. Progression of liver metastasis. New multifocal lytic bone metastases. 2. Subtle striated nephrographic appearance of both kidneys which is nonspecific but may be associated with pyelonephritis as well as embolic disease. Metastasis to the kidneys is considered less favored. Electronically Signed   By: Kerby Moors M.D.   On: 09/03/2018 09:15   Mr Lumbar Spine W Wo Contrast  Result Date: 08/30/2018 CLINICAL DATA:  Metastatic non-small cell lung cancer with progressive constipation and urinary hesitancy.  Back pain EXAM: MRI LUMBAR SPINE WITHOUT AND WITH CONTRAST TECHNIQUE: Multiplanar and multiecho pulse sequences of the lumbar spine were obtained without and with intravenous contrast. CONTRAST:  10 mL Gadovist IV COMPARISON:  CT abdomen pelvis 07/02/2018 FINDINGS: Segmentation:  Normal Alignment:  Normal Vertebrae: Multiple enhancing metastatic deposits throughout the lumbar spine. Metastatic disease also in the sacrum and T11 and T12 vertebral body. These are not definitely seen on the prior CT and likely have progressed in the interval. Negative for fracture. Conus medullaris and cauda equina: Conus extends to the L1-2 level. Conus and cauda equina appear normal. Paraspinal and other soft tissues: Expansile mass in the region of the right twelfth rib compatible with metastatic disease. This is not seen on the prior CT. Paraspinous soft tissue mass to the right of L5 measures 10 x 20 mm compatible with metastatic disease extension from the L5 vertebral body. This was not present on the prior CT. Disc levels: Small central disc protrusion at L5-S1. No significant spinal stenosis in the lumbar spine. IMPRESSION: Multiple metastatic deposits throughout the lower thoracic and lumbar spine with progression from the CT of 07/02/2018. No fracture or epidural tumor. Paraspinous soft tissue mass due to tumor extension to the right of L5. Metastatic expansile lesion to the right twelfth  rib not seen on the prior study. Electronically Signed   By: Franchot Gallo M.D.   On: 08/30/2018 16:52   Ct Abdomen Pelvis W Contrast  Result Date: 09/03/2018 CLINICAL DATA:  Restaging lung cancer. EXAM: CT CHEST, ABDOMEN, AND PELVIS WITH CONTRAST TECHNIQUE: Multidetector CT imaging of the chest, abdomen and pelvis was performed following the standard protocol during bolus administration of intravenous contrast. CONTRAST:  17mL OMNIPAQUE IOHEXOL 300 MG/ML  SOLN COMPARISON:  07/02/2018 FINDINGS: CT CHEST FINDINGS Cardiovascular: The  heart size appears normal. Previous median sternotomy and CABG procedure. No pericardial effusion. Mediastinum/Nodes: Normal appearance of the thyroid gland. The trachea appears patent and is midline. Normal appearance of the esophagus. No supraclavicular or axillary adenopathy. There are 2 new right CP angle lymph nodes which measure up to 1.6 cm, image 37/2. At the level of the hiatus there is a new lymph node anterior to the esophagus and medial to the inferior cavoatrial junction measuring 1.5 cm, image 42/2. Lungs/Pleura: Increase in volume of right pleural effusion. The dominant mass at the right lung base demonstrates significant interval increase in size in the interval and now involves the diaphragm and dome of liver. This measures 8.5 by 5.7 by 6.4 cm, image 43/2 and image 102/4. Previously 5.0 x 1.3 by 1.2 cm. Multiple new pleural nodules are identified within the right hemithorax. The largest is in the posterior costophrenic angle measuring 4.1 by 2.6 by 3.5 cm and exhibits chest wall extension. Perifissural lesion within the anteromedial aspect of the base of right upper lobe measures 1.6 x 1.2 cm, image 69/4. Previously 1.1 x 1.3 cm. Musculoskeletal: New lucent lesion within T1 vertebra measures 7 mm, image 15/4. The new lucent lesion within T6 vertebra measures 1.2 cm, image 16/6. Subtle increase sclerosis within the T4 vertebra noted, suspicious for metastasis, image 120/6. Very subtle lucent lesion within the body of sternum measures 1.5 cm, image 114/6. Previously 1.2 cm. CT ABDOMEN PELVIS FINDINGS Hepatobiliary: New lesion within segment 4a measures 1.8 cm. The previously noted segment 4a lesion measures 2.4 cm, image 49/2. Previously 1.4 cm. New segment 6 lesion measures 0.7 cm, image 70/2. Previous cholecystectomy. Pancreas: Unremarkable. No pancreatic ductal dilatation or surrounding inflammatory changes. Spleen: Normal in size without focal abnormality. Adrenals/Urinary Tract: Normal adrenal  glands. There is a subtle striated nephrographic appearance of both kidneys. No distinct mass or hydronephrosis identified. Urinary bladder appears normal. Stomach/Bowel: Stomach is within normal limits. Appendix appears normal. No evidence of bowel wall thickening, distention, or inflammatory changes. Numerous colonic diverticula identified without acute inflammation. Vascular/Lymphatic: Aortic atherosclerosis. No aneurysm. No abdominopelvic adenopathy. Reproductive: Prostate is unremarkable. Other: No free fluid or fluid collections. Musculoskeletal: There is a lytic lesion involving the superior endplate of the L2 vertebral body which appears new from previous exam. Also new is a lucent lesion involving the L4 vertebral body, image 107/6. IMPRESSION: 1. Interval progression of disease. There has been considerable increase in size right lung base mass which now invades the right hemidiaphragm and dome of liver. The right pleural effusion is increased in size and there are multiple new pleural nodules throughout the right hemithorax. New right CP angle and posterior mediastinal adenopathy. Progression of liver metastasis. New multifocal lytic bone metastases. 2. Subtle striated nephrographic appearance of both kidneys which is nonspecific but may be associated with pyelonephritis as well as embolic disease. Metastasis to the kidneys is considered less favored. Electronically Signed   By: Kerby Moors M.D.   On: 09/03/2018 09:15  Dg Hip Unilat W Or W/o Pelvis 1 View Right  Result Date: 08/30/2018 CLINICAL DATA:  Pelvic pain.  Stage IV lung cancer. EXAM: DG HIP (WITH OR WITHOUT PELVIS) 1V RIGHT COMPARISON:  None. FINDINGS: There is no evidence of hip fracture or dislocation. There is no evidence of arthropathy or other focal bone abnormality. IMPRESSION: No acute abnormality. Electronically Signed   By: Abelardo Diesel M.D.   On: 08/30/2018 15:24        This case was discussed with Dr. [name]. He expressed  agreement with my management of this patient.

## 2018-09-07 ENCOUNTER — Other Ambulatory Visit: Payer: Self-pay | Admitting: Oncology

## 2018-09-07 ENCOUNTER — Inpatient Hospital Stay: Payer: Medicaid Other | Admitting: *Deleted

## 2018-09-07 ENCOUNTER — Telehealth: Payer: Self-pay | Admitting: *Deleted

## 2018-09-07 DIAGNOSIS — C3491 Malignant neoplasm of unspecified part of right bronchus or lung: Secondary | ICD-10-CM

## 2018-09-07 DIAGNOSIS — J90 Pleural effusion, not elsewhere classified: Secondary | ICD-10-CM

## 2018-09-07 NOTE — Telephone Encounter (Signed)
Received VM message from patient. He states he is feeling a bit more short of breath and is asking if we can schedule a time to drain the fluid from around his lung. Spoke with Mikey Bussing, NP. Order placed for thoracentesis for next week.  TCT patient and advised him of the above. He voiced understanding and that he is good for next week.  Advised pt to go to ED if SOB gets worse over the weekend. He states he will do that.  He is aware of his other upcoming appts.

## 2018-09-07 NOTE — Progress Notes (Signed)
Histology and Location of Primary Cancer: DIAGNOSIS: Stage IVB(T1b, N2, M1c)non-small cell lung cancer, adenocarcinoma presented with right upper lobe lung nodule in addition to right hilar and mediastinal lymphadenopathy as well as metastatic liver and bone disease diagnosed in February 2019.  Sites of Visceral and Bony Metastatic Disease: 08/30/18: MR Lumbar:  IMPRESSION: Multiple metastatic deposits throughout the lower thoracic and lumbar spine with progression from the CT of 07/02/2018. No fracture or epidural tumor.  Paraspinous soft tissue mass due to tumor extension to the right of L5. Metastatic expansile lesion to the right twelfth rib not seen on the prior study.  CT C/A/P 09/03/18:  IMPRESSION: 1. Interval progression of disease. There has been considerable increase in size right lung base mass which now invades the right hemidiaphragm and dome of liver. The right pleural effusion is increased in size and there are multiple new pleural nodules throughout the right hemithorax. New right CP angle and posterior mediastinal adenopathy. Progression of liver metastasis. New multifocal lytic bone metastases. 2. Subtle striated nephrographic appearance of both kidneys which is nonspecific but may be associated with pyelonephritis as well as embolic disease. Metastasis to the kidneys is considered less favored.  Location(s) of Symptomatic Metastases: For the metastatic disease in the back, I will refer the patient to Dr. Tammi Klippel for evaluation and consideration of palliative radiotherapy to that area  Past/Anticipated chemotherapy by medical oncology, if any: Per Dr. [name] 09/04/18:  PRIOR THERAPY:  1) Palliative radiotherapy to the metastatic bone lesions in the cervical spine and left femur under the care of Dr. Sondra Come. 2) Systemic chemotherapy with carboplatin for AUC of 5, Alimta 500 mg/M2 and Keytruda 200 mg IV every 3 weeks.  First dose October 04, 2017.  Status post 3 cycles. 3)  palliative stereotactic radiotherapy to the progressive liver lesion. 4)  Maintenance treatment with Alimta 500 mg/M2 and Keytruda 200 mg IV every 3 weeks.  Status post 12 cycles.  Last dose was giving August 14, 2018 discontinued secondary to disease progression.  CURRENT THERAPY: Systemic chemotherapy with docetaxel 75 mg/M2 and Cyramza 10 mg/KG every 3 weeks with Neulasta support.  First dose September 11, 2018.  Pain on a scale of 0-10 is: pt reports that pain is more discomfort and is rated 3/10. Pt reports that he took muscle relaxer earlier this am. Pain is located in RIGHT hip and lower back.   If Spine Met(s), symptoms, if any, include:  Bowel/Bladder retention or incontinence (please describe): progressive constipation and urinary hesitancy  Numbness or weakness in extremities (please describe): numbness and tingling in fingers and toes - possibly attributed to chemotherapy. Current Decadron regimen, if applicable: dexamethasone (DECADRON) 4 MG tablet 2 tablet p.o. twice daily the day before, day of and day after the chemotherapy every 3 weeks   Ambulatory status? Walker? Wheelchair?: Ambulatory  SAFETY ISSUES: Prior radiation? 03/26/18 - 04/05/18: Liver, Right Lobe, 60 Gy in 5 fractions of 12 Gy/ SBRT, 10X-FFF   09/21/17 - 10/04/17: Spine, C4, and Femur, Left Proximal, each received 30 Gy in 10 fractions with Isodose plan, 15X  Pacemaker/ICD? No  Possible current pregnancy? No  Is the patient on methotrexate? No  Current Complaints / other details:  Pt presents today for reconsult with Dr. Sondra Come for Radiation Oncology. Pt is unaccompanied.   BP 126/88 (BP Location: Right Arm, Patient Position: Sitting)   Pulse 89   Temp 98.1 F (36.7 C) (Oral)   Resp 20   Ht '5\' 10"'$  (1.778 m)   Wt  219 lb 9.6 oz (99.6 kg)   SpO2 97%   BMI 31.51 kg/m   Wt Readings from Last 3 Encounters:  09/13/18 219 lb 9.6 oz (99.6 kg)  09/04/18 214 lb 14.4 oz (97.5 kg)  08/30/18 214 lb 1.6 oz  (97.1 kg)   Loma Sousa, RN BSN

## 2018-09-07 NOTE — Progress Notes (Signed)
[facility] [facility]  Clinical Social Work met patient in [place] office for counseling session.  Mr. Alpern shared recent cancer progession and plan for treatment.  CSW and patient explored and processed emotional responses to disease progression including communicating with loved ones, managing symptoms at home, and managing emotions/losses as they come.  Patient plans to follow up with CSW after upcoming radiation oncology consult.  [name], LCSW  Clinical Social Worker [facility]

## 2018-09-11 ENCOUNTER — Other Ambulatory Visit: Payer: Self-pay | Admitting: Medical Oncology

## 2018-09-11 ENCOUNTER — Other Ambulatory Visit: Payer: Self-pay | Admitting: Internal Medicine

## 2018-09-11 ENCOUNTER — Inpatient Hospital Stay: Payer: Medicaid Other

## 2018-09-11 ENCOUNTER — Other Ambulatory Visit (HOSPITAL_COMMUNITY)
Admission: RE | Admit: 2018-09-11 | Discharge: 2018-09-11 | Disposition: A | Discharge status: Home / Self Care | Payer: Medicaid Other | Source: Other Acute Inpatient Hospital | Attending: Internal Medicine | Admitting: Internal Medicine

## 2018-09-11 VITALS — BP 122/87 | HR 85 | Temp 97.6°F | Resp 18

## 2018-09-11 DIAGNOSIS — Z5112 Encounter for antineoplastic immunotherapy: Secondary | ICD-10-CM

## 2018-09-11 DIAGNOSIS — D649 Anemia, unspecified: Secondary | ICD-10-CM

## 2018-09-11 DIAGNOSIS — C3491 Malignant neoplasm of unspecified part of right bronchus or lung: Secondary | ICD-10-CM

## 2018-09-11 LAB — CBC WITH DIFFERENTIAL (CANCER CENTER ONLY)
Abs Immature Granulocytes: 0.18 10*3/uL — ABNORMAL HIGH (ref 0.00–0.07)
Basophils Absolute: 0 10*3/uL (ref 0.0–0.1)
Basophils Relative: 0 %
EOS ABS: 0 10*3/uL (ref 0.0–0.5)
Eosinophils Relative: 0 %
HCT: 26 % — ABNORMAL LOW (ref 39.0–52.0)
Hemoglobin: 8.3 g/dL — ABNORMAL LOW (ref 13.0–17.0)
Immature Granulocytes: 1 %
Lymphocytes Relative: 4 %
Lymphs Abs: 0.7 10*3/uL (ref 0.7–4.0)
MCH: 29.2 pg (ref 26.0–34.0)
MCHC: 31.9 g/dL (ref 30.0–36.0)
MCV: 91.5 fL (ref 80.0–100.0)
Monocytes Absolute: 0.9 10*3/uL (ref 0.1–1.0)
Monocytes Relative: 6 %
Neutro Abs: 13.6 10*3/uL — ABNORMAL HIGH (ref 1.7–7.7)
Neutrophils Relative %: 89 %
PLATELETS: 339 10*3/uL (ref 150–400)
RBC: 2.84 MIL/uL — ABNORMAL LOW (ref 4.22–5.81)
RDW: 14.2 % (ref 11.5–15.5)
WBC: 15.4 10*3/uL — AB (ref 4.0–10.5)
nRBC: 0 % (ref 0.0–0.2)

## 2018-09-11 LAB — TSH: TSH: 0.483 u[IU]/mL (ref 0.320–4.118)

## 2018-09-11 LAB — URINALYSIS, DIPSTICK ONLY
Bilirubin Urine: NEGATIVE
Glucose, UA: NEGATIVE mg/dL
Hgb urine dipstick: NEGATIVE
Ketones, ur: NEGATIVE mg/dL
Leukocytes, UA: NEGATIVE
Nitrite: NEGATIVE
Protein, ur: NEGATIVE mg/dL
Specific Gravity, Urine: 1.012 (ref 1.005–1.030)
pH: 5 (ref 5.0–8.0)

## 2018-09-11 LAB — CMP (CANCER CENTER ONLY)
ALT: 17 U/L (ref 0–44)
AST: 22 U/L (ref 15–41)
Albumin: 2.8 g/dL — ABNORMAL LOW (ref 3.5–5.0)
Alkaline Phosphatase: 108 U/L (ref 38–126)
Anion gap: 14 (ref 5–15)
BUN: 16 mg/dL (ref 6–20)
CO2: 22 mmol/L (ref 22–32)
Calcium: 10.5 mg/dL — ABNORMAL HIGH (ref 8.9–10.3)
Chloride: 99 mmol/L (ref 98–111)
Creatinine: 1.03 mg/dL (ref 0.61–1.24)
GFR, Estimated: >60 mL/min (ref >60)
Glucose, Bld: 127 mg/dL — ABNORMAL HIGH (ref 70–99)
Potassium: 4.5 mmol/L (ref 3.5–5.1)
Sodium: 135 mmol/L (ref 135–145)
Total Bilirubin: 0.5 mg/dL (ref 0.3–1.2)
Total Protein: 7.5 g/dL (ref 6.5–8.1)

## 2018-09-11 LAB — PREPARE RBC (CROSSMATCH)

## 2018-09-11 LAB — ABO/RH: ABO/RH(D): A POS

## 2018-09-11 MED ORDER — DIPHENHYDRAMINE HCL 50 MG/ML IJ SOLN
INTRAMUSCULAR | Status: AC
  Filled 2018-09-11: qty 1

## 2018-09-11 MED ORDER — SODIUM CHLORIDE 0.9 % IV SOLN
Freq: Once | INTRAVENOUS | Status: AC
  Administered 2018-09-11: 10:00:00 via INTRAVENOUS
  Filled 2018-09-11: qty 250

## 2018-09-11 MED ORDER — SODIUM CHLORIDE 0.9% IV SOLUTION
250.0000 mL | Freq: Once | INTRAVENOUS | Status: DC
  Filled 2018-09-11: qty 250

## 2018-09-11 MED ORDER — OXYCODONE-ACETAMINOPHEN 5-325 MG PO TABS
ORAL_TABLET | ORAL | Status: AC
  Filled 2018-09-11: qty 2

## 2018-09-11 MED ORDER — TRAMADOL HCL 50 MG PO TABS
50.0000 mg | ORAL_TABLET | Freq: Once | ORAL | Status: AC
  Administered 2018-09-11: 50 mg via ORAL

## 2018-09-11 MED ORDER — SODIUM CHLORIDE 0.9 % IV SOLN
10.0000 mg/kg | Freq: Once | INTRAVENOUS | Status: AC
  Administered 2018-09-11: 1000 mg via INTRAVENOUS
  Filled 2018-09-11: qty 100

## 2018-09-11 MED ORDER — SODIUM CHLORIDE 0.9 % IV SOLN: 10.0000 mg | Freq: Once | INTRAVENOUS | Status: DC

## 2018-09-11 MED ORDER — OXYCODONE-ACETAMINOPHEN 5-325 MG PO TABS: 2.0000 | ORAL_TABLET | Freq: Once | ORAL | Status: DC

## 2018-09-11 MED ORDER — DIPHENHYDRAMINE HCL 50 MG/ML IJ SOLN
50.0000 mg | Freq: Once | INTRAMUSCULAR | Status: AC
  Administered 2018-09-11: 50 mg via INTRAVENOUS

## 2018-09-11 MED ORDER — DIPHENHYDRAMINE HCL 25 MG PO CAPS: 25.0000 mg | ORAL_CAPSULE | Freq: Once | ORAL | Status: DC

## 2018-09-11 MED ORDER — SODIUM CHLORIDE 0.9 % IV SOLN
75.0000 mg/m2 | Freq: Once | INTRAVENOUS | Status: AC
  Administered 2018-09-11: 160 mg via INTRAVENOUS
  Filled 2018-09-11: qty 16

## 2018-09-11 MED ORDER — DEXAMETHASONE SODIUM PHOSPHATE 10 MG/ML IJ SOLN
INTRAMUSCULAR | Status: AC
  Filled 2018-09-11: qty 1

## 2018-09-11 MED ORDER — ACETAMINOPHEN 325 MG PO TABS
650.0000 mg | ORAL_TABLET | Freq: Once | ORAL | Status: AC
  Administered 2018-09-11: 650 mg via ORAL

## 2018-09-11 MED ORDER — ACETAMINOPHEN 325 MG PO TABS
ORAL_TABLET | ORAL | Status: AC
  Filled 2018-09-11: qty 2

## 2018-09-11 MED ORDER — MORPHINE SULFATE 4 MG/ML IJ SOLN
2.0000 mg | INTRAMUSCULAR | Status: AC | PRN
  Administered 2018-09-11 (×2): 2 mg via INTRAVENOUS
  Filled 2018-09-11: qty 1

## 2018-09-11 MED ORDER — MORPHINE SULFATE (PF) 4 MG/ML IV SOLN
INTRAVENOUS | Status: AC
  Filled 2018-09-11: qty 1

## 2018-09-11 MED ORDER — TRAMADOL HCL 50 MG PO TABS
ORAL_TABLET | ORAL | Status: AC
  Filled 2018-09-11: qty 1

## 2018-09-11 MED ORDER — DEXAMETHASONE SODIUM PHOSPHATE 10 MG/ML IJ SOLN
10.0000 mg | Freq: Once | INTRAMUSCULAR | Status: AC
  Administered 2018-09-11: 10 mg via INTRAVENOUS

## 2018-09-11 NOTE — Progress Notes (Signed)
About two-thirds of the way into pt's first time Cyramza infusion, observed facial flushing that radiated down the side of pt's neck. Cyramza infusion paused at 12:27 and saline line opened wide. Vitals cycle, stable, and Dr. [name] called with update. Pt denied any CP or SOB. Received the OK from Dr. [name] to resume Cyramza infusion. Infusion restarted at 12:30. Pt verbalized understanding to let staff know should any new symptoms arise. Will continue to monitor    09/11/18 1228  Vitals  BP 127/82  BP Location Left Arm  Patient Position Sitting  Pulse Rate 97  Resp 18  Pulse Oximetry  SpO2 100 %

## 2018-09-11 NOTE — Patient Instructions (Addendum)
Ramucirumab injection What is this medicine? RAMUCIRUMAB (ra mue SIR ue mab) is a monoclonal antibody. It is used to treat stomach cancer, colorectal cancer, liver cancer, and lung cancer. This medicine may be used for other purposes; ask your health care provider or pharmacist if you have questions. COMMON BRAND NAME(S): Cyramza What should I tell my health care provider before I take this medicine? They need to know if you have any of these conditions: -bleeding disorders -blood clots -heart disease, including heart failure, heart attack, or chest pain (angina) -high blood pressure -infection (especially a virus infection such as chickenpox, cold sores, or herpes) -protein in your urine -recent surgery -stroke -an unusual or allergic reaction to ramucirumab, other medicines, foods, dyes, or preservatives -pregnant or trying to get pregnant -breast-feeding How should I use this medicine? This medicine is for infusion into a vein. It is given by a health care professional in a hospital or clinic setting. Talk to your pediatrician regarding the use of this medicine in children. Special care may be needed. Overdosage: If you think you have taken too much of this medicine contact a poison control center or emergency room at once. NOTE: This medicine is only for you. Do not share this medicine with others. What if I miss a dose? It is important not to miss your dose. Call your doctor or health care professional if you are unable to keep an appointment. What may interact with this medicine? Interactions have not been studied. This list may not describe all possible interactions. Give your health care provider a list of all the medicines, herbs, non-prescription drugs, or dietary supplements you use. Also tell them if you smoke, drink alcohol, or use illegal drugs. Some items may interact with your medicine. What should I watch for while using this medicine? Your condition will be monitored  carefully while you are receiving this medicine. You will need to to check your blood pressure and have your blood and urine tested while you are taking this medicine. Your condition will be monitored carefully while you are receiving this medicine. This medicine may increase your risk to bruise or bleed. Call your doctor or health care professional if you notice any unusual bleeding. This medicine may rarely cause 'gastrointestinal perforation' (holes in the stomach, intestines or colon), a serious side effect requiring surgery to repair. This medicine should be started at least 28 days following major surgery and the site of the surgery should be totally healed. Check with your doctor before scheduling dental work or surgery while you are receiving this treatment. Talk to your doctor if you have recently had surgery or if you have a wound that has not healed. Do not become pregnant while taking this medicine or for 3 months after stopping it. Women should inform their doctor if they wish to become pregnant or think they might be pregnant. There is a potential for serious side effects to an unborn child. Talk to your health care professional or pharmacist for more information. Do not breast-feed an infant while taking this medicine or for 2 months after stopping it. This medicine may interfere with the ability to have a child. Talk with your doctor or health care professional if you are concerned about your fertility. What side effects may I notice from receiving this medicine? Side effects that you should report to your doctor or health care professional as soon as possible: -allergic reactions like skin rash, itching or hives, breathing problems, swelling of the face, lips, or  tongue -signs of infection - fever or chills, cough, sore throat -chest pain or chest tightness -confusion -dizziness -feeling faint or lightheaded, falls -severe abdominal pain -severe nausea, vomiting -signs and symptoms  of bleeding such as bloody or black, tarry stools; red or dark-brown urine; spitting up blood or brown material that looks like coffee grounds; red spots on the skin; unusual bruising or bleeding from the eye, gums, or nose -signs and symptoms of a blood clot such as breathing problems; changes in vision; chest pain; severe, sudden headache; pain, swelling, warmth in the leg; trouble speaking; sudden numbness or weakness of the face, arm or leg -symptoms of a stroke: change in mental awareness, inability to talk or move one side of the body -trouble walking, dizziness, loss of balance or coordination Side effects that usually do not require medical attention (report to your doctor or health care professional if they continue or are bothersome): -cold, clammy skin -constipation -diarrhea -headache -nausea, vomiting -stomach pain -unusually slow heartbeat -unusually weak or tired This list may not describe all possible side effects. Call your doctor for medical advice about side effects. You may report side effects to FDA at 1-800-FDA-1088. Where should I keep my medicine? This drug is given in a hospital or clinic and will not be stored at home. NOTE: This sheet is a summary. It may not cover all possible information. If you have questions about this medicine, talk to your doctor, pharmacist, or health care provider.  2019 Elsevier/Gold Standard (2017-12-27 11:25:23) Docetaxel injection What is this medicine? DOCETAXEL (doe se TAX el) is a chemotherapy drug. It targets fast dividing cells, like cancer cells, and causes these cells to die. This medicine is used to treat many types of cancers like breast cancer, certain stomach cancers, head and neck cancer, lung cancer, and prostate cancer. This medicine may be used for other purposes; ask your health care provider or pharmacist if you have questions. COMMON BRAND NAME(S): Docefrez, Taxotere What should I tell my health care provider before I  take this medicine? They need to know if you have any of these conditions: -infection (especially a virus infection such as chickenpox, cold sores, or herpes) -liver disease -low blood counts, like low white cell, platelet, or red cell counts -an unusual or allergic reaction to docetaxel, polysorbate 80, other chemotherapy agents, other medicines, foods, dyes, or preservatives -pregnant or trying to get pregnant -breast-feeding How should I use this medicine? This drug is given as an infusion into a vein. It is administered in a hospital or clinic by a specially trained health care professional. Talk to your pediatrician regarding the use of this medicine in children. Special care may be needed. Overdosage: If you think you have taken too much of this medicine contact a poison control center or emergency room at once. NOTE: This medicine is only for you. Do not share this medicine with others. What if I miss a dose? It is important not to miss your dose. Call your doctor or health care professional if you are unable to keep an appointment. What may interact with this medicine? -cyclosporine -erythromycin -ketoconazole -medicines to increase blood counts like filgrastim, pegfilgrastim, sargramostim -vaccines Talk to your doctor or health care professional before taking any of these medicines: -acetaminophen -aspirin -ibuprofen -ketoprofen -naproxen This list may not describe all possible interactions. Give your health care provider a list of all the medicines, herbs, non-prescription drugs, or dietary supplements you use. Also tell them if you smoke, drink alcohol, or  use illegal drugs. Some items may interact with your medicine. What should I watch for while using this medicine? Your condition will be monitored carefully while you are receiving this medicine. You will need important blood work done while you are taking this medicine. This drug may make you feel generally unwell. This is  not uncommon, as chemotherapy can affect healthy cells as well as cancer cells. Report any side effects. Continue your course of treatment even though you feel ill unless your doctor tells you to stop. In some cases, you may be given additional medicines to help with side effects. Follow all directions for their use. Call your doctor or health care professional for advice if you get a fever, chills or sore throat, or other symptoms of a cold or flu. Do not treat yourself. This drug decreases your body's ability to fight infections. Try to avoid being around people who are sick. This medicine may increase your risk to bruise or bleed. Call your doctor or health care professional if you notice any unusual bleeding. This medicine may contain alcohol in the product. You may get drowsy or dizzy. Do not drive, use machinery, or do anything that needs mental alertness until you know how this medicine affects you. Do not stand or sit up quickly, especially if you are an older patient. This reduces the risk of dizzy or fainting spells. Avoid alcoholic drinks. Do not become pregnant while taking this medicine or for 6 months after stopping it. Women should inform their doctor if they wish to become pregnant or think they might be pregnant. Men should not father a child while taking this medicine and for 3 months after stopping it. There is a potential for serious side effects to an unborn child. Talk to your health care professional or pharmacist for more information. Do not breast-feed an infant while taking this medicine or for 2 weeks after stopping it. This may interfere with the ability to father a child. You should talk to your doctor or health care professional if you are concerned about your fertility. What side effects may I notice from receiving this medicine? Side effects that you should report to your doctor or health care professional as soon as possible: -allergic reactions like skin rash, itching or  hives, swelling of the face, lips, or tongue -low blood counts - This drug may decrease the number of white blood cells, red blood cells and platelets. You may be at increased risk for infections and bleeding. -signs of infection - fever or chills, cough, sore throat, pain or difficulty passing urine -signs of decreased platelets or bleeding - bruising, pinpoint red spots on the skin, black, tarry stools, nosebleeds -signs of decreased red blood cells - unusually weak or tired, fainting spells, lightheadedness -breathing problems -fast or irregular heartbeat -low blood pressure -mouth sores -nausea and vomiting -pain, swelling, redness or irritation at the injection site -pain, tingling, numbness in the hands or feet -swelling of the ankle, feet, hands -weight gain Side effects that usually do not require medical attention (report to your doctor or health care professional if they continue or are bothersome): -bone pain -complete hair loss including hair on your head, underarms, pubic hair, eyebrows, and eyelashes -diarrhea -excessive tearing -changes in the color of fingernails -loosening of the fingernails -nausea -muscle pain -red flush to skin -sweating -weak or tired This list may not describe all possible side effects. Call your doctor for medical advice about side effects. You may report side effects to  FDA at 1-800-FDA-1088. Where should I keep my medicine? This drug is given in a hospital or clinic and will not be stored at home. NOTE: This sheet is a summary. It may not cover all possible information. If you have questions about this medicine, talk to your doctor, pharmacist, or health care provider.  2019 Elsevier/Gold Standard (2017-08-14 12:07:21)  Blood Transfusion, Adult, Care After This sheet gives you information about how to care for yourself after your procedure. Your doctor may also give you more specific instructions. If you have problems or questions, contact  your doctor. Follow these instructions at home:   Take over-the-counter and prescription medicines only as told by your doctor.  Go back to your normal activities as told by your doctor.  Follow instructions from your doctor about how to take care of the area where an IV tube was put into your vein (insertion site). Make sure you: ? Wash your hands with soap and water before you change your bandage (dressing). If there is no soap and water, use hand sanitizer. ? Change your bandage as told by your doctor.  Check your IV insertion site every day for signs of infection. Check for: ? More redness, swelling, or pain. ? More fluid or blood. ? Warmth. ? Pus or a bad smell. Contact a doctor if:  You have more redness, swelling, or pain around the IV insertion site.  You have more fluid or blood coming from the IV insertion site.  Your IV insertion site feels warm to the touch.  You have pus or a bad smell coming from the IV insertion site.  Your pee (urine) turns pink, red, or brown.  You feel weak after doing your normal activities. Get help right away if:  You have signs of a serious allergic or body defense (immune) system reaction, including: ? Itchiness. ? Hives. ? Trouble breathing. ? Anxiety. ? Pain in your chest or lower back. ? Fever, flushing, and chills. ? Fast pulse. ? Rash. ? Watery poop (diarrhea). ? Throwing up (vomiting). ? Dark pee. ? Serious headache. ? Dizziness. ? Stiff neck. ? Yellow color in your face or the white parts of your eyes (jaundice). Summary  After a blood transfusion, return to your normal activities as told by your doctor.  Every day, check for signs of infection where the IV tube was put into your vein.  Some signs of infection are warm skin, more redness and pain, more fluid or blood, and pus or a bad smell where the needle went in.  Contact your doctor if you feel weak or have any unusual symptoms. This information is not  intended to replace advice given to you by your health care provider. Make sure you discuss any questions you have with your health care provider. Document Released: 08/08/2014 Document Revised: 03/11/2016 Document Reviewed: 03/11/2016 Elsevier Interactive Patient Education  [employer].

## 2018-09-12 ENCOUNTER — Inpatient Hospital Stay: Payer: Medicaid Other

## 2018-09-12 DIAGNOSIS — D649 Anemia, unspecified: Secondary | ICD-10-CM

## 2018-09-12 DIAGNOSIS — Z5112 Encounter for antineoplastic immunotherapy: Secondary | ICD-10-CM | POA: Diagnosis not present

## 2018-09-12 LAB — TYPE AND SCREEN
ABO/RH(D): A POS
Antibody Screen: NEGATIVE
Unit division: 0

## 2018-09-12 LAB — BPAM RBC
Blood Product Expiration Date: 202003072359
ISSUE DATE / TIME: 202002111428
UNIT TYPE AND RH: 6200

## 2018-09-12 LAB — PREPARE RBC (CROSSMATCH)

## 2018-09-12 MED ORDER — ACETAMINOPHEN 325 MG PO TABS
650.0000 mg | ORAL_TABLET | Freq: Once | ORAL | Status: AC
  Administered 2018-09-12: 650 mg via ORAL

## 2018-09-12 MED ORDER — SODIUM CHLORIDE 0.9% IV SOLUTION
250.0000 mL | Freq: Once | INTRAVENOUS | Status: AC
  Administered 2018-09-12: 250 mL via INTRAVENOUS
  Filled 2018-09-12: qty 250

## 2018-09-12 MED ORDER — DIPHENHYDRAMINE HCL 25 MG PO CAPS
ORAL_CAPSULE | ORAL | Status: AC
  Filled 2018-09-12: qty 1

## 2018-09-12 MED ORDER — DIPHENHYDRAMINE HCL 25 MG PO CAPS
25.0000 mg | ORAL_CAPSULE | Freq: Once | ORAL | Status: AC
  Administered 2018-09-12: 25 mg via ORAL

## 2018-09-12 MED ORDER — ACETAMINOPHEN 325 MG PO TABS
ORAL_TABLET | ORAL | Status: AC
  Filled 2018-09-12: qty 2

## 2018-09-12 NOTE — Patient Instructions (Signed)
Blood Transfusion, Adult, Care After This sheet gives you information about how to care for yourself after your procedure. Your doctor may also give you more specific instructions. If you have problems or questions, contact your doctor. Follow these instructions at home:   Take over-the-counter and prescription medicines only as told by your doctor.  Go back to your normal activities as told by your doctor.  Follow instructions from your doctor about how to take care of the area where an IV tube was put into your vein (insertion site). Make sure you: ? Wash your hands with soap and water before you change your bandage (dressing). If there is no soap and water, use hand sanitizer. ? Change your bandage as told by your doctor.  Check your IV insertion site every day for signs of infection. Check for: ? More redness, swelling, or pain. ? More fluid or blood. ? Warmth. ? Pus or a bad smell. Contact a doctor if:  You have more redness, swelling, or pain around the IV insertion site.  You have more fluid or blood coming from the IV insertion site.  Your IV insertion site feels warm to the touch.  You have pus or a bad smell coming from the IV insertion site.  Your pee (urine) turns pink, red, or brown.  You feel weak after doing your normal activities. Get help right away if:  You have signs of a serious allergic or body defense (immune) system reaction, including: ? Itchiness. ? Hives. ? Trouble breathing. ? Anxiety. ? Pain in your chest or lower back. ? Fever, flushing, and chills. ? Fast pulse. ? Rash. ? Watery poop (diarrhea). ? Throwing up (vomiting). ? Dark pee. ? Serious headache. ? Dizziness. ? Stiff neck. ? Yellow color in your face or the white parts of your eyes (jaundice). Summary  After a blood transfusion, return to your normal activities as told by your doctor.  Every day, check for signs of infection where the IV tube was put into your vein.  Some  signs of infection are warm skin, more redness and pain, more fluid or blood, and pus or a bad smell where the needle went in.  Contact your doctor if you feel weak or have any unusual symptoms. This information is not intended to replace advice given to you by your health care provider. Make sure you discuss any questions you have with your health care provider. Document Released: 08/08/2014 Document Revised: 03/11/2016 Document Reviewed: 03/11/2016 Elsevier Interactive Patient Education  2019 Else[employer] Inc.  

## 2018-09-13 ENCOUNTER — Ambulatory Visit
Admission: RE | Admit: 2018-09-13 | Discharge: 2018-09-13 | Disposition: A | Discharge status: Home / Self Care | Payer: Medicaid Other | Source: Ambulatory Visit | Attending: Radiation Oncology | Admitting: Radiation Oncology

## 2018-09-13 ENCOUNTER — Other Ambulatory Visit: Payer: Self-pay

## 2018-09-13 ENCOUNTER — Inpatient Hospital Stay: Payer: Medicaid Other

## 2018-09-13 ENCOUNTER — Encounter: Payer: Self-pay | Admitting: Radiation Oncology

## 2018-09-13 VITALS — BP 126/88 | HR 89 | Temp 98.1°F | Resp 20 | Ht 70.0 in | Wt 219.6 lb

## 2018-09-13 DIAGNOSIS — C7951 Secondary malignant neoplasm of bone: Secondary | ICD-10-CM

## 2018-09-13 DIAGNOSIS — C3491 Malignant neoplasm of unspecified part of right bronchus or lung: Secondary | ICD-10-CM

## 2018-09-13 DIAGNOSIS — Z79899 Other long term (current) drug therapy: Secondary | ICD-10-CM | POA: Insufficient documentation

## 2018-09-13 DIAGNOSIS — Z7982 Long term (current) use of aspirin: Secondary | ICD-10-CM | POA: Diagnosis not present

## 2018-09-13 DIAGNOSIS — C787 Secondary malignant neoplasm of liver and intrahepatic bile duct: Secondary | ICD-10-CM | POA: Diagnosis not present

## 2018-09-13 LAB — BPAM RBC
Blood Product Expiration Date: 202003072359
ISSUE DATE / TIME: 202002120926
UNIT TYPE AND RH: 6200

## 2018-09-13 LAB — TYPE AND SCREEN
ABO/RH(D): A POS
Antibody Screen: NEGATIVE
Unit division: 0

## 2018-09-13 MED ORDER — PEGFILGRASTIM-CBQV 6 MG/0.6ML ~~LOC~~ SOSY
PREFILLED_SYRINGE | SUBCUTANEOUS | Status: AC
  Filled 2018-09-13: qty 0.6

## 2018-09-13 NOTE — Progress Notes (Signed)
Radiation Oncology         [phone]) [phone] ________________________________  Name: Anthony [name] MRN: [id number]  Date: 09/13/2018  DOB: [birth year]  Re-evaluation Note  CC: [name], MD  [name], MD    ICD-10-CM   1. Stage 4 lung cancer, right [facility]) C34.91   2. 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 He has been followed by Dr. [name] for stage IV lung cancer. He recently began to experience an increase in lower back pain and constipation. Lumbar spine MRI on 08/30/2018 showed multiple progressive metastatic deposits of the lower thoracic and lumbar spine, with tumor extension to the right of L5 on the 12th rib.  Most recent CT chest/abdomen/pelvis on 09/03/2018 revealed interval disease progression: considerable increase in right lung base mass, multiple new pleural nodules throughout right hemithorax, new right CP angle and posterior mediastinal adenopathy, liver metastasis progression, and new multifocal lytic bone metastasis.  He was started on new systemic chemotherapy on 09/11/2018 under Dr. [name]. He is scheduled for ultrasound-guided thoracentesis tomorrow, 09/14/2018. He has kindly been referred to [place] for consideration of palliative radiation to his painful bony lesions.  Patient reports he took a muscle relaxer this morning and now rates the pain to his right hip and lower back as 3/10 and notes the pain is more discomfort. He reports pain with taking a deep breath.                ALLERGIES:  has No Known Allergies.  Meds: Current Outpatient Medications    Medication Sig Dispense Refill  . acetaminophen (TYLENOL) 500 MG tablet Take 500-1,000 mg by mouth daily as needed for moderate pain or headache.    Marland Kitchen aspirin EC 81 MG EC tablet Take 1 tablet (81 mg total) by mouth daily.    Marland Kitchen atorvastatin (LIPITOR) 40 MG tablet Take 1 tablet (40 mg total) by mouth daily. 90 tablet 3  . cetirizine (ZYRTEC) 10 MG tablet Take 10 mg by mouth daily as needed for allergies.     . chlorproMAZINE (THORAZINE) 25 MG tablet Take 1 tablet (25 mg total) by mouth 3 (three) times daily as needed. 30 tablet 0  . cyclobenzaprine (FLEXERIL) 5 MG tablet Take 1 tablet (5 mg total) by mouth 3 (three) times daily as needed for muscle spasms. 30 tablet 0  . dexamethasone (DECADRON) 4 MG tablet 2 tablet p.o. twice daily the day before, day of and day after the chemotherapy every 3 weeks 40 tablet 0  . esomeprazole (NEXIUM) 20 MG capsule Take 20 mg by mouth every other day.    . lisinopril (PRINIVIL,ZESTRIL) 5 MG tablet TAKE 1 TABLET BY MOUTH EVERY DAY 90 tablet 3  . metoprolol tartrate (LOPRESSOR) 25 MG tablet Take 12.5 mg by mouth 2 (two) times daily.    Marland Kitchen oxyCODONE-acetaminophen (PERCOCET/ROXICET) 5-325 MG tablet Take 1 tablet by mouth every 6 (six) hours as needed for severe pain. 30 tablet 0  . prochlorperazine (COMPAZINE) 10 MG tablet Take 1 tablet (10 mg total) by mouth every 6 (six) hours as needed for nausea  or vomiting. 30 tablet 1  . folic acid (FOLVITE) 1 MG tablet Take 1 tablet (1 mg total) by mouth daily. (Patient not taking: Reported on 09/13/2018) 30 tablet 2  . mirtazapine (REMERON) 30 MG tablet Take 1 tablet (30 mg total) by mouth at bedtime. (Patient not taking: Reported on 08/30/2018) 30 tablet 2   No current facility-administered medications for this encounter.     Physical Findings: The patient is in no acute distress. Patient is alert and oriented.  height is 5\' 10"  (1.778 m) and weight is 219 lb 9.6 oz (99.6 kg). His oral temperature is 98.1 F (36.7 C). His  blood pressure is 126/88 and his pulse is 89. His respiration is 20 and oxygen saturation is 97%.  Lungs are clear to auscultation bilaterally. Heart has regular rate and rhythm. No palpable cervical, supraclavicular, or axillary adenopathy. Abdomen soft, non-tender, normal bowel sounds. Very slight weakness to the proximal muscles of his right lower extremity.   Lab Findings: Lab Results  Component Value Date   WBC 15.4 (H) 09/11/2018   HGB 8.3 (L) 09/11/2018   HCT 26.0 (L) 09/11/2018   MCV 91.5 09/11/2018   PLT 339 09/11/2018    Radiographic Findings: Dg Abd 1 View  Result Date: 08/30/2018 CLINICAL DATA:  Lower back and abdomen pain. EXAM: ABDOMEN - 1 VIEW COMPARISON:  None. FINDINGS: The bowel gas pattern is normal. Moderate bowel content is identified throughout colon. No radio-opaque calculi or other significant radiographic abnormality are seen. IMPRESSION: No bowel obstruction. Moderate bowel content identified throughout colon, this can be seen in constipation. Electronically Signed   By: Abelardo Diesel M.D.   On: 08/30/2018 15:21   Ct Chest W Contrast  Result Date: 09/03/2018 CLINICAL DATA:  Restaging lung cancer. EXAM: CT CHEST, ABDOMEN, AND PELVIS WITH CONTRAST TECHNIQUE: Multidetector CT imaging of the chest, abdomen and pelvis was performed following the standard protocol during bolus administration of intravenous contrast. CONTRAST:  166mL OMNIPAQUE IOHEXOL 300 MG/ML  SOLN COMPARISON:  07/02/2018 FINDINGS: CT CHEST FINDINGS Cardiovascular: The heart size appears normal. Previous median sternotomy and CABG procedure. No pericardial effusion. Mediastinum/Nodes: Normal appearance of the thyroid gland. The trachea appears patent and is midline. Normal appearance of the esophagus. No supraclavicular or axillary adenopathy. There are 2 new right CP angle lymph nodes which measure up to 1.6 cm, image 37/2. At the level of the hiatus there is a new lymph node anterior to the esophagus and  medial to the inferior cavoatrial junction measuring 1.5 cm, image 42/2. Lungs/Pleura: Increase in volume of right pleural effusion. The dominant mass at the right lung base demonstrates significant interval increase in size in the interval and now involves the diaphragm and dome of liver. This measures 8.5 by 5.7 by 6.4 cm, image 43/2 and image 102/4. Previously 5.0 x 1.3 by 1.2 cm. Multiple new pleural nodules are identified within the right hemithorax. The largest is in the posterior costophrenic angle measuring 4.1 by 2.6 by 3.5 cm and exhibits chest wall extension. Perifissural lesion within the anteromedial aspect of the base of right upper lobe measures 1.6 x 1.2 cm, image 69/4. Previously 1.1 x 1.3 cm. Musculoskeletal: New lucent lesion within T1 vertebra measures 7 mm, image 15/4. The new lucent lesion within T6 vertebra measures 1.2 cm, image 16/6. Subtle increase sclerosis within the T4 vertebra noted, suspicious for metastasis, image 120/6. Very subtle lucent lesion within the body of sternum measures 1.5 cm, image 114/6. Previously 1.2 cm. CT ABDOMEN PELVIS  FINDINGS Hepatobiliary: New lesion within segment 4a measures 1.8 cm. The previously noted segment 4a lesion measures 2.4 cm, image 49/2. Previously 1.4 cm. New segment 6 lesion measures 0.7 cm, image 70/2. Previous cholecystectomy. Pancreas: Unremarkable. No pancreatic ductal dilatation or surrounding inflammatory changes. Spleen: Normal in size without focal abnormality. Adrenals/Urinary Tract: Normal adrenal glands. There is a subtle striated nephrographic appearance of both kidneys. No distinct mass or hydronephrosis identified. Urinary bladder appears normal. Stomach/Bowel: Stomach is within normal limits. Appendix appears normal. No evidence of bowel wall thickening, distention, or inflammatory changes. Numerous colonic diverticula identified without acute inflammation. Vascular/Lymphatic: Aortic atherosclerosis. No aneurysm. No abdominopelvic  adenopathy. Reproductive: Prostate is unremarkable. Other: No free fluid or fluid collections. Musculoskeletal: There is a lytic lesion involving the superior endplate of the L2 vertebral body which appears new from previous exam. Also new is a lucent lesion involving the L4 vertebral body, image 107/6. IMPRESSION: 1. Interval progression of disease. There has been considerable increase in size right lung base mass which now invades the right hemidiaphragm and dome of liver. The right pleural effusion is increased in size and there are multiple new pleural nodules throughout the right hemithorax. New right CP angle and posterior mediastinal adenopathy. Progression of liver metastasis. New multifocal lytic bone metastases. 2. Subtle striated nephrographic appearance of both kidneys which is nonspecific but may be associated with pyelonephritis as well as embolic disease. Metastasis to the kidneys is considered less favored. Electronically Signed   By: Kerby Moors M.D.   On: 09/03/2018 09:15   Mr Lumbar Spine W Wo Contrast  Result Date: 08/30/2018 CLINICAL DATA:  Metastatic non-small cell lung cancer with progressive constipation and urinary hesitancy. Back pain EXAM: MRI LUMBAR SPINE WITHOUT AND WITH CONTRAST TECHNIQUE: Multiplanar and multiecho pulse sequences of the lumbar spine were obtained without and with intravenous contrast. CONTRAST:  10 mL Gadovist IV COMPARISON:  CT abdomen pelvis 07/02/2018 FINDINGS: Segmentation:  Normal Alignment:  Normal Vertebrae: Multiple enhancing metastatic deposits throughout the lumbar spine. Metastatic disease also in the sacrum and T11 and T12 vertebral body. These are not definitely seen on the prior CT and likely have progressed in the interval. Negative for fracture. Conus medullaris and cauda equina: Conus extends to the L1-2 level. Conus and cauda equina appear normal. Paraspinal and other soft tissues: Expansile mass in the region of the right twelfth rib  compatible with metastatic disease. This is not seen on the prior CT. Paraspinous soft tissue mass to the right of L5 measures 10 x 20 mm compatible with metastatic disease extension from the L5 vertebral body. This was not present on the prior CT. Disc levels: Small central disc protrusion at L5-S1. No significant spinal stenosis in the lumbar spine. IMPRESSION: Multiple metastatic deposits throughout the lower thoracic and lumbar spine with progression from the CT of 07/02/2018. No fracture or epidural tumor. Paraspinous soft tissue mass due to tumor extension to the right of L5. Metastatic expansile lesion to the right twelfth rib not seen on the prior study. Electronically Signed   By: Franchot Gallo M.D.   On: 08/30/2018 16:52   Ct Abdomen Pelvis W Contrast  Result Date: 09/03/2018 CLINICAL DATA:  Restaging lung cancer. EXAM: CT CHEST, ABDOMEN, AND PELVIS WITH CONTRAST TECHNIQUE: Multidetector CT imaging of the chest, abdomen and pelvis was performed following the standard protocol during bolus administration of intravenous contrast. CONTRAST:  168mL OMNIPAQUE IOHEXOL 300 MG/ML  SOLN COMPARISON:  07/02/2018 FINDINGS: CT CHEST FINDINGS Cardiovascular: The heart size  appears normal. Previous median sternotomy and CABG procedure. No pericardial effusion. Mediastinum/Nodes: Normal appearance of the thyroid gland. The trachea appears patent and is midline. Normal appearance of the esophagus. No supraclavicular or axillary adenopathy. There are 2 new right CP angle lymph nodes which measure up to 1.6 cm, image 37/2. At the level of the hiatus there is a new lymph node anterior to the esophagus and medial to the inferior cavoatrial junction measuring 1.5 cm, image 42/2. Lungs/Pleura: Increase in volume of right pleural effusion. The dominant mass at the right lung base demonstrates significant interval increase in size in the interval and now involves the diaphragm and dome of liver. This measures 8.5 by 5.7 by 6.4  cm, image 43/2 and image 102/4. Previously 5.0 x 1.3 by 1.2 cm. Multiple new pleural nodules are identified within the right hemithorax. The largest is in the posterior costophrenic angle measuring 4.1 by 2.6 by 3.5 cm and exhibits chest wall extension. Perifissural lesion within the anteromedial aspect of the base of right upper lobe measures 1.6 x 1.2 cm, image 69/4. Previously 1.1 x 1.3 cm. Musculoskeletal: New lucent lesion within T1 vertebra measures 7 mm, image 15/4. The new lucent lesion within T6 vertebra measures 1.2 cm, image 16/6. Subtle increase sclerosis within the T4 vertebra noted, suspicious for metastasis, image 120/6. Very subtle lucent lesion within the body of sternum measures 1.5 cm, image 114/6. Previously 1.2 cm. CT ABDOMEN PELVIS FINDINGS Hepatobiliary: New lesion within segment 4a measures 1.8 cm. The previously noted segment 4a lesion measures 2.4 cm, image 49/2. Previously 1.4 cm. New segment 6 lesion measures 0.7 cm, image 70/2. Previous cholecystectomy. Pancreas: Unremarkable. No pancreatic ductal dilatation or surrounding inflammatory changes. Spleen: Normal in size without focal abnormality. Adrenals/Urinary Tract: Normal adrenal glands. There is a subtle striated nephrographic appearance of both kidneys. No distinct mass or hydronephrosis identified. Urinary bladder appears normal. Stomach/Bowel: Stomach is within normal limits. Appendix appears normal. No evidence of bowel wall thickening, distention, or inflammatory changes. Numerous colonic diverticula identified without acute inflammation. Vascular/Lymphatic: Aortic atherosclerosis. No aneurysm. No abdominopelvic adenopathy. Reproductive: Prostate is unremarkable. Other: No free fluid or fluid collections. Musculoskeletal: There is a lytic lesion involving the superior endplate of the L2 vertebral body which appears new from previous exam. Also new is a lucent lesion involving the L4 vertebral body, image 107/6. IMPRESSION: 1.  Interval progression of disease. There has been considerable increase in size right lung base mass which now invades the right hemidiaphragm and dome of liver. The right pleural effusion is increased in size and there are multiple new pleural nodules throughout the right hemithorax. New right CP angle and posterior mediastinal adenopathy. Progression of liver metastasis. New multifocal lytic bone metastases. 2. Subtle striated nephrographic appearance of both kidneys which is nonspecific but may be associated with pyelonephritis as well as embolic disease. Metastasis to the kidneys is considered less favored. Electronically Signed   By: Kerby Moors M.D.   On: 09/03/2018 09:15   Dg Hip Unilat W Or W/o Pelvis 1 View Right  Result Date: 08/30/2018 CLINICAL DATA:  Pelvic pain.  Stage IV lung cancer. EXAM: DG HIP (WITH OR WITHOUT PELVIS) 1V RIGHT COMPARISON:  None. FINDINGS: There is no evidence of hip fracture or dislocation. There is no evidence of arthropathy or other focal bone abnormality. IMPRESSION: No acute abnormality. Electronically Signed   By: Abelardo Diesel M.D.   On: 08/30/2018 15:24    Impression:  Stage IVB(T1b, N2, M1c)non-small cell right lung  cancer with bone and liver metastases  Patient would be a good candidate for radiotherapy directed at his lumbar spine. He has had some pain to the right pelvis, but this has been getting better and there was no obvious reason to explain this on recent imaging.  Today, I talked to the patient about the findings and work-up thus far. We discussed the available radiation techniques, and focused on the details of logistics and delivery.  We reviewed the anticipated acute and late sequelae associated with radiation in this setting.  The patient was encouraged to ask questions that I answered to the best of my ability.  A patient consent form was discussed and signed.  We retained a copy for our records.  The patient would like to proceed with radiation  and will be scheduled for CT simulation.  Plan:  He is scheduled for CT simulation on Monday, 09/17/2018 at 3 pm. Anticipate 10-14 treatments directed at the lumbar spine. Anticipate beginning treatment on Wednesday, 09/19/2018.  -----------------------------------  Blair Promise, PhD, MD  This document serves as a record of services personally performed by Gery Pray, MD. It was created on his behalf by Wilburn Mylar, a trained medical scribe. The creation of this record is based on the scribe's personal observations and the provider's statements to them. This document has been checked and approved by the attending provider.

## 2018-09-14 ENCOUNTER — Telehealth: Payer: Self-pay | Admitting: Medical Oncology

## 2018-09-14 ENCOUNTER — Ambulatory Visit (HOSPITAL_COMMUNITY)
Admission: RE | Admit: 2018-09-14 | Discharge: 2018-09-14 | Disposition: A | Discharge status: Home / Self Care | Payer: Medicaid Other | Source: Ambulatory Visit | Attending: Oncology | Admitting: Oncology

## 2018-09-14 ENCOUNTER — Ambulatory Visit (HOSPITAL_COMMUNITY)
Admission: RE | Admit: 2018-09-14 | Discharge: 2018-09-14 | Disposition: A | Discharge status: Home / Self Care | Payer: Medicaid Other | Source: Ambulatory Visit | Attending: Radiology | Admitting: Radiology

## 2018-09-14 ENCOUNTER — Other Ambulatory Visit: Payer: Self-pay | Admitting: Medical Oncology

## 2018-09-14 ENCOUNTER — Telehealth: Payer: Self-pay | Admitting: *Deleted

## 2018-09-14 DIAGNOSIS — J90 Pleural effusion, not elsewhere classified: Secondary | ICD-10-CM | POA: Diagnosis not present

## 2018-09-14 DIAGNOSIS — Z9889 Other specified postprocedural states: Secondary | ICD-10-CM | POA: Insufficient documentation

## 2018-09-14 DIAGNOSIS — K1379 Other lesions of oral mucosa: Secondary | ICD-10-CM

## 2018-09-14 DIAGNOSIS — C3491 Malignant neoplasm of unspecified part of right bronchus or lung: Secondary | ICD-10-CM | POA: Insufficient documentation

## 2018-09-14 LAB — BODY FLUID CELL COUNT WITH DIFFERENTIAL
Eos, Fluid: 2 %
Lymphs, Fluid: 38 %
Monocyte-Macrophage-Serous Fluid: 28 % — ABNORMAL LOW (ref 50–90)
Neutrophil Count, Fluid: 32 % — ABNORMAL HIGH (ref 0–25)
Total Nucleated Cell Count, Fluid: 762 cu mm (ref 0–1000)

## 2018-09-14 MED ORDER — LIDOCAINE HCL 1 % IJ SOLN
INTRAMUSCULAR | Status: AC
  Filled 2018-09-14: qty 20

## 2018-09-14 MED ORDER — MAGIC MOUTHWASH: 5.0000 mL | Freq: Four times a day (QID) | ORAL | 1 refills | Status: DC

## 2018-09-14 NOTE — Telephone Encounter (Signed)
Pt request palliative care. Referral made.

## 2018-09-14 NOTE — Procedures (Signed)
Ultrasound-guided diagnostic and therapeutic right thoracentesis performed yielding 350 cc of dark, bloody fluid. No immediate complications. Follow-up chest x-ray pending. The fluid was sent to the lab for preordered studies. The pleural fluid was multiloculated and only the above amount could be aspirated today. EBL trace.

## 2018-09-14 NOTE — Telephone Encounter (Signed)
CSW contacted patient by phone to follow up after patient's thoracentesis procedure.  Patient reported feeling much better and stated he's made plans to start palliative radiation next week. CSW explored other concerns at this time including managing pain, new side effects, and supporting his children.  Patient was interested in [facility] palliative program.  CSW discussed with medical oncologist and he is agreeable.  CSW requested order be placed on patient's behalf.  CSW scheduled follow up counseling session with patient.  Maryjean Morn, MSW, LCSW, OSW-C Clinical Social Worker [facility] [phone]

## 2018-09-14 NOTE — Telephone Encounter (Signed)
Mouth Pain-" My mouth is one big sore, can I get a mouthwash?" MMW called in .

## 2018-09-17 ENCOUNTER — Other Ambulatory Visit: Payer: Self-pay | Admitting: *Deleted

## 2018-09-17 ENCOUNTER — Ambulatory Visit
Admission: RE | Admit: 2018-09-17 | Discharge: 2018-09-17 | Disposition: A | Discharge status: Home / Self Care | Payer: Medicaid Other | Source: Ambulatory Visit | Attending: Radiation Oncology | Admitting: Radiation Oncology

## 2018-09-17 DIAGNOSIS — C3491 Malignant neoplasm of unspecified part of right bronchus or lung: Secondary | ICD-10-CM | POA: Insufficient documentation

## 2018-09-17 DIAGNOSIS — C7951 Secondary malignant neoplasm of bone: Secondary | ICD-10-CM | POA: Diagnosis not present

## 2018-09-17 DIAGNOSIS — Z510 Encounter for antineoplastic radiation therapy: Secondary | ICD-10-CM | POA: Diagnosis not present

## 2018-09-17 LAB — BODY FLUID CULTURE
CULTURE: NO GROWTH
Special Requests: NORMAL

## 2018-09-17 NOTE — Progress Notes (Signed)
  Radiation Oncology         [phone]) [phone] ________________________________  Name: Anthony [name] MRN: [id number]  Date: 09/17/2018  DOB: 1971/07/23  SIMULATION AND TREATMENT PLANNING NOTE    ICD-10-CM   1. Stage 4 lung cancer, right ([place]) C34.91   2. Bone metastasis (HCC) C79.51     DIAGNOSIS:  Stage 4 lung cancer of the right lobe, bone metastasis, liver metastasis   NARRATIVE:  The patient was brought to the [place].  Identity was confirmed.  All relevant records and images related to the planned course of therapy were reviewed.  The patient freely provided informed written consent to proceed with treatment after reviewing the details related to the planned course of therapy. The consent form was witnessed and verified by the simulation staff.  Then, the patient was set-up in a stable reproducible  supine position for radiation therapy.  CT images were obtained.  Surface markings were placed.  The CT images were loaded into the planning software.  Then the target and avoidance structures were contoured.  Treatment planning then occurred.  The radiation prescription was entered and confirmed.  Then, I designed and supervised the construction of a total of 3 medically necessary complex treatment devices.  I have requested : 3D Simulation  I have requested a DVH of the following structures: GTV, kidneys, spinal cord.  I have ordered:dose calc.  PLAN:  The patient will receive 35 Gy in 14 fractions.  -----------------------------------  Blair Promise, PhD, MD This document serves as a record of services personally performed by Gery Pray, MD. It was created on his behalf by Mary-Margaret Loma Messing, a trained medical scribe. The creation of this record is based on the scribe's personal observations and the provider's statements to them. This document has been checked and approved by the attending provider.

## 2018-09-18 ENCOUNTER — Inpatient Hospital Stay (HOSPITAL_BASED_OUTPATIENT_CLINIC_OR_DEPARTMENT_OTHER): Payer: Medicaid Other | Admitting: Medical

## 2018-09-18 ENCOUNTER — Inpatient Hospital Stay: Payer: Medicaid Other

## 2018-09-18 ENCOUNTER — Inpatient Hospital Stay: Payer: Medicaid Other | Admitting: *Deleted

## 2018-09-18 ENCOUNTER — Telehealth: Payer: Self-pay | Admitting: Emergency Medicine

## 2018-09-18 ENCOUNTER — Telehealth: Payer: Self-pay | Admitting: Medical Oncology

## 2018-09-18 ENCOUNTER — Telehealth: Payer: Self-pay | Admitting: Medical

## 2018-09-18 VITALS — BP 119/89 | HR 108 | Temp 98.1°F | Resp 18 | Ht 70.0 in | Wt 207.8 lb

## 2018-09-18 DIAGNOSIS — R590 Localized enlarged lymph nodes: Secondary | ICD-10-CM

## 2018-09-18 DIAGNOSIS — Z5111 Encounter for antineoplastic chemotherapy: Secondary | ICD-10-CM | POA: Diagnosis not present

## 2018-09-18 DIAGNOSIS — R079 Chest pain, unspecified: Secondary | ICD-10-CM

## 2018-09-18 DIAGNOSIS — D702 Other drug-induced agranulocytosis: Secondary | ICD-10-CM | POA: Insufficient documentation

## 2018-09-18 DIAGNOSIS — R5383 Other fatigue: Secondary | ICD-10-CM

## 2018-09-18 DIAGNOSIS — B370 Candidal stomatitis: Secondary | ICD-10-CM

## 2018-09-18 DIAGNOSIS — C3412 Malignant neoplasm of upper lobe, left bronchus or lung: Secondary | ICD-10-CM | POA: Diagnosis not present

## 2018-09-18 DIAGNOSIS — C787 Secondary malignant neoplasm of liver and intrahepatic bile duct: Secondary | ICD-10-CM

## 2018-09-18 DIAGNOSIS — Z5112 Encounter for antineoplastic immunotherapy: Secondary | ICD-10-CM | POA: Diagnosis not present

## 2018-09-18 DIAGNOSIS — C779 Secondary and unspecified malignant neoplasm of lymph node, unspecified: Secondary | ICD-10-CM

## 2018-09-18 DIAGNOSIS — Z7982 Long term (current) use of aspirin: Secondary | ICD-10-CM

## 2018-09-18 DIAGNOSIS — I252 Old myocardial infarction: Secondary | ICD-10-CM

## 2018-09-18 DIAGNOSIS — C3491 Malignant neoplasm of unspecified part of right bronchus or lung: Secondary | ICD-10-CM

## 2018-09-18 DIAGNOSIS — K219 Gastro-esophageal reflux disease without esophagitis: Secondary | ICD-10-CM

## 2018-09-18 DIAGNOSIS — J90 Pleural effusion, not elsewhere classified: Secondary | ICD-10-CM

## 2018-09-18 DIAGNOSIS — Z9221 Personal history of antineoplastic chemotherapy: Secondary | ICD-10-CM

## 2018-09-18 DIAGNOSIS — Z79899 Other long term (current) drug therapy: Secondary | ICD-10-CM

## 2018-09-18 DIAGNOSIS — M5127 Other intervertebral disc displacement, lumbosacral region: Secondary | ICD-10-CM

## 2018-09-18 DIAGNOSIS — R531 Weakness: Secondary | ICD-10-CM

## 2018-09-18 DIAGNOSIS — C7951 Secondary malignant neoplasm of bone: Secondary | ICD-10-CM

## 2018-09-18 DIAGNOSIS — D709 Neutropenia, unspecified: Secondary | ICD-10-CM

## 2018-09-18 DIAGNOSIS — I700 Atherosclerosis of aorta: Secondary | ICD-10-CM

## 2018-09-18 DIAGNOSIS — Z923 Personal history of irradiation: Secondary | ICD-10-CM

## 2018-09-18 LAB — CMP (CANCER CENTER ONLY)
ALT: 52 U/L — ABNORMAL HIGH (ref 0–44)
AST: 38 U/L (ref 15–41)
Albumin: 3 g/dL — ABNORMAL LOW (ref 3.5–5.0)
Alkaline Phosphatase: 133 U/L — ABNORMAL HIGH (ref 38–126)
Anion gap: 9 (ref 5–15)
BUN: 19 mg/dL (ref 6–20)
CO2: 28 mmol/L (ref 22–32)
Calcium: 10 mg/dL (ref 8.9–10.3)
Chloride: 98 mmol/L (ref 98–111)
Creatinine: 1.02 mg/dL (ref 0.61–1.24)
GFR, Est AFR Am: >60 mL/min (ref >60)
GFR, Estimated: >60 mL/min (ref >60)
Glucose, Bld: 102 mg/dL — ABNORMAL HIGH (ref 70–99)
POTASSIUM: 4.7 mmol/L (ref 3.5–5.1)
Sodium: 135 mmol/L (ref 135–145)
TOTAL PROTEIN: 7.6 g/dL (ref 6.5–8.1)
Total Bilirubin: 1 mg/dL (ref 0.3–1.2)

## 2018-09-18 LAB — CBC WITH DIFFERENTIAL (CANCER CENTER ONLY)
ABS IMMATURE GRANULOCYTES: 0.06 10*3/uL (ref 0.00–0.07)
Basophils Absolute: 0 10*3/uL (ref 0.0–0.1)
Basophils Relative: 1 %
Eosinophils Absolute: 0 10*3/uL (ref 0.0–0.5)
Eosinophils Relative: 2 %
HCT: 39.5 % (ref 39.0–52.0)
Hemoglobin: 12.8 g/dL — ABNORMAL LOW (ref 13.0–17.0)
Immature Granulocytes: 5 %
LYMPHS PCT: 47 %
Lymphs Abs: 0.5 10*3/uL — ABNORMAL LOW (ref 0.7–4.0)
MCH: 29.1 pg (ref 26.0–34.0)
MCHC: 32.4 g/dL (ref 30.0–36.0)
MCV: 89.8 fL (ref 80.0–100.0)
Monocytes Absolute: 0.2 10*3/uL (ref 0.1–1.0)
Monocytes Relative: 18 %
NEUTROS ABS: 0.3 10*3/uL — AB (ref 1.7–7.7)
Neutrophils Relative %: 27 %
Platelet Count: 300 10*3/uL (ref 150–400)
RBC: 4.4 MIL/uL (ref 4.22–5.81)
RDW: 13.5 % (ref 11.5–15.5)
WBC: 1.1 10*3/uL — AB (ref 4.0–10.5)
nRBC: 0 % (ref 0.0–0.2)

## 2018-09-18 LAB — TSH: TSH: 2.476 u[IU]/mL (ref 0.320–4.118)

## 2018-09-18 MED ORDER — TBO-FILGRASTIM 480 MCG/0.8ML ~~LOC~~ SOSY
480.0000 ug | PREFILLED_SYRINGE | Freq: Once | SUBCUTANEOUS | Status: AC
  Administered 2018-09-18: 480 ug via SUBCUTANEOUS

## 2018-09-18 MED ORDER — OXYCODONE-ACETAMINOPHEN 5-325 MG PO TABS: 1.0000 | ORAL_TABLET | Freq: Four times a day (QID) | ORAL | 0 refills | Status: DC | PRN

## 2018-09-18 MED ORDER — FLUCONAZOLE 100 MG PO TABS: 100.0000 mg | ORAL_TABLET | Freq: Every day | ORAL | 0 refills | Status: DC

## 2018-09-18 MED ORDER — LIDOCAINE VISCOUS HCL 2 % MT SOLN: 5.0000 mL | OROMUCOSAL | 2 refills | Status: DC | PRN

## 2018-09-18 MED ORDER — TBO-FILGRASTIM 480 MCG/0.8ML ~~LOC~~ SOSY
PREFILLED_SYRINGE | SUBCUTANEOUS | Status: AC
  Filled 2018-09-18: qty 0.8

## 2018-09-18 MED ORDER — TBO-FILGRASTIM 480 MCG/0.8ML ~~LOC~~ SOSY: 480.0000 ug | PREFILLED_SYRINGE | Freq: Every day | SUBCUTANEOUS | Status: DC

## 2018-09-18 MED FILL — OXYCODONE-ACETAMINOPHEN 5-3: 5-325 | 7 days supply | Qty: 30 | Fill #0

## 2018-09-18 MED FILL — FLUCONAZOLE 100 MG TAB: 100 | 7 days supply | Qty: 7 | Fill #0

## 2018-09-18 MED FILL — LIDOCAINE 2% VISCOUS SOLN: 2 | 10 days supply | Qty: 100 | Fill #0

## 2018-09-18 NOTE — Telephone Encounter (Signed)
D/t patient's labs today he requires Granix today.  Orders placed by PA Lucianne Lei.  Pt called, in meeting with SW, will come back up for injection today afterwards.

## 2018-09-18 NOTE — Telephone Encounter (Signed)
Scheduled appt per 2/18 sch message - pt is aware of appt date and time

## 2018-09-18 NOTE — Telephone Encounter (Signed)
Mouth sores- not any better despite using MMW. Having trouble talking. [facility] appt today with labs.

## 2018-09-18 NOTE — Progress Notes (Signed)
[name]  Clinical Social Work met with patient in [place] office for counseling session. Anthony [name] shared many new side effects accompanied with new chemotherapy treatment. CSW and patient explored new barriers and losses associated with his disease progression. CSW introduced the idea of legacy goals- patient and CSW scheduled to meet next week.  [name], LCSW  Clinical Social Worker [facility]

## 2018-09-18 NOTE — Patient Instructions (Signed)
Tbo-Filgrastim injection What is this medicine? TBO-FILGRASTIM (T B O fil GRA stim) is a granulocyte colony-stimulating factor that stimulates the growth of neutrophils, a type of white blood cell important in the body's fight against infection. It is used to reduce the incidence of fever and infection in patients with certain types of cancer who are receiving chemotherapy that affects the bone marrow. This medicine may be used for other purposes; ask your health care provider or pharmacist if you have questions. COMMON BRAND NAME(S): Granix What should I tell my health care provider before I take this medicine? They need to know if you have any of these conditions: -bone scan or tests planned -kidney disease -sickle cell anemia -an unusual or allergic reaction to tbo-filgrastim, filgrastim, pegfilgrastim, other medicines, foods, dyes, or preservatives -pregnant or trying to get pregnant -breast-feeding How should I use this medicine? This medicine is for injection under the skin. If you get this medicine at home, you will be taught how to prepare and give this medicine. Refer to the Instructions for Use that come with your medication packaging. Use exactly as directed. Take your medicine at regular intervals. Do not take your medicine more often than directed. It is important that you put your used needles and syringes in a special sharps container. Do not put them in a trash can. If you do not have a sharps container, call your pharmacist or healthcare provider to get one. Talk to your pediatrician regarding the use of this medicine in children. While this drug may be prescribed for children as young as 1 month of age for selected conditions, precautions do apply. Overdosage: If you think you have taken too much of this medicine contact a poison control center or emergency room at once. NOTE: This medicine is only for you. Do not share this medicine with others. What if I miss a dose? It is  important not to miss your dose. Call your doctor or health care professional if you miss a dose. What may interact with this medicine? This medicine may interact with the following medications: -medicines that may cause a release of neutrophils, such as lithium This list may not describe all possible interactions. Give your health care provider a list of all the medicines, herbs, non-prescription drugs, or dietary supplements you use. Also tell them if you smoke, drink alcohol, or use illegal drugs. Some items may interact with your medicine. What should I watch for while using this medicine? You may need blood work done while you are taking this medicine. What side effects may I notice from receiving this medicine? Side effects that you should report to your doctor or health care professional as soon as possible: -allergic reactions like skin rash, itching or hives, swelling of the face, lips, or tongue -back pain -blood in the urine -dark urine -dizziness -fast heartbeat -feeling faint -shortness of breath or breathing problems -signs and symptoms of infection like fever or chills; cough; or sore throat -signs and symptoms of kidney injury like trouble passing urine or change in the amount of urine -stomach or side pain, or pain at the shoulder -sweating -swelling of the legs, ankles, or abdomen -tiredness Side effects that usually do not require medical attention (report to your doctor or health care professional if they continue or are bothersome): -bone pain -diarrhea -headache -muscle pain -vomiting This list may not describe all possible side effects. Call your doctor for medical advice about side effects. You may report side effects to FDA at   1-800-FDA-1088. Where should I keep my medicine? Keep out of the reach of children. Store in a refrigerator between 2 and 8 degrees C (36 and 46 degrees F). Keep in carton to protect from light. Throw away this medicine if it is left out  of the refrigerator for more than 5 consecutive days. Throw away any unused medicine after the expiration date. NOTE: This sheet is a summary. It may not cover all possible information. If you have questions about this medicine, talk to your doctor, pharmacist, or health care provider.  2019 [employer] (2017-03-07 16:56:18)  

## 2018-09-18 NOTE — Progress Notes (Signed)
Pt seen by Pa Lucianne Lei only, no RN assessment at this time.  PA aware.

## 2018-09-19 ENCOUNTER — Other Ambulatory Visit: Payer: Self-pay | Admitting: *Deleted

## 2018-09-19 ENCOUNTER — Inpatient Hospital Stay: Payer: Medicaid Other

## 2018-09-19 ENCOUNTER — Ambulatory Visit
Admission: RE | Admit: 2018-09-19 | Discharge: 2018-09-19 | Disposition: A | Discharge status: Home / Self Care | Payer: Medicaid Other | Source: Ambulatory Visit | Attending: Radiation Oncology | Admitting: Radiation Oncology

## 2018-09-19 VITALS — BP 117/83 | HR 88 | Temp 99.6°F | Resp 18

## 2018-09-19 DIAGNOSIS — Z510 Encounter for antineoplastic radiation therapy: Secondary | ICD-10-CM | POA: Diagnosis not present

## 2018-09-19 DIAGNOSIS — C7951 Secondary malignant neoplasm of bone: Secondary | ICD-10-CM

## 2018-09-19 DIAGNOSIS — Z5112 Encounter for antineoplastic immunotherapy: Secondary | ICD-10-CM | POA: Diagnosis not present

## 2018-09-19 DIAGNOSIS — D702 Other drug-induced agranulocytosis: Secondary | ICD-10-CM

## 2018-09-19 DIAGNOSIS — C3491 Malignant neoplasm of unspecified part of right bronchus or lung: Secondary | ICD-10-CM

## 2018-09-19 MED ORDER — TBO-FILGRASTIM 480 MCG/0.8ML ~~LOC~~ SOSY
PREFILLED_SYRINGE | SUBCUTANEOUS | Status: AC
  Filled 2018-09-19: qty 0.8

## 2018-09-19 MED ORDER — TBO-FILGRASTIM 480 MCG/0.8ML ~~LOC~~ SOSY
480.0000 ug | PREFILLED_SYRINGE | Freq: Once | SUBCUTANEOUS | Status: AC
  Administered 2018-09-19: 480 ug via SUBCUTANEOUS

## 2018-09-19 NOTE — Progress Notes (Signed)
  Radiation Oncology         [phone] ________________________________  Name: Anthony [name] MRN: [id number]  Date: 09/19/2018  DOB: 04-05-1971  Simulation Verification Note    ICD-10-CM   1. Bone metastasis ([place]) C79.51     Status: outpatient  NARRATIVE: The patient was brought to the treatment unit and placed in the planned treatment position. The clinical setup was verified. Then port films were obtained and uploaded to the radiation oncology medical record software.  The treatment beams were carefully compared against the planned radiation fields. The position location and shape of the radiation fields was reviewed. They targeted volume of tissue appears to be appropriately covered by the radiation beams. Organs at risk appear to be excluded as planned.  Based on my personal review, I approved the simulation verification. The patient's treatment will proceed as planned.  -----------------------------------  Blair Promise, PhD, MD

## 2018-09-20 ENCOUNTER — Ambulatory Visit
Admission: RE | Admit: 2018-09-20 | Discharge: 2018-09-20 | Disposition: A | Discharge status: Home / Self Care | Payer: Medicaid Other | Source: Ambulatory Visit | Attending: Radiation Oncology | Admitting: Radiation Oncology

## 2018-09-20 ENCOUNTER — Inpatient Hospital Stay: Payer: Medicaid Other

## 2018-09-20 VITALS — BP 120/75 | HR 98 | Temp 98.7°F | Resp 18

## 2018-09-20 DIAGNOSIS — C3491 Malignant neoplasm of unspecified part of right bronchus or lung: Secondary | ICD-10-CM

## 2018-09-20 DIAGNOSIS — Z5112 Encounter for antineoplastic immunotherapy: Secondary | ICD-10-CM | POA: Diagnosis not present

## 2018-09-20 DIAGNOSIS — D702 Other drug-induced agranulocytosis: Secondary | ICD-10-CM

## 2018-09-20 DIAGNOSIS — Z510 Encounter for antineoplastic radiation therapy: Secondary | ICD-10-CM | POA: Diagnosis not present

## 2018-09-20 MED ORDER — TBO-FILGRASTIM 480 MCG/0.8ML ~~LOC~~ SOSY
PREFILLED_SYRINGE | SUBCUTANEOUS | Status: AC
  Filled 2018-09-20: qty 0.8

## 2018-09-20 MED ORDER — TBO-FILGRASTIM 480 MCG/0.8ML ~~LOC~~ SOSY
480.0000 ug | PREFILLED_SYRINGE | Freq: Once | SUBCUTANEOUS | Status: AC
  Administered 2018-09-20: 480 ug via SUBCUTANEOUS

## 2018-09-21 ENCOUNTER — Other Ambulatory Visit: Payer: Self-pay | Admitting: *Deleted

## 2018-09-21 ENCOUNTER — Ambulatory Visit
Admission: RE | Admit: 2018-09-21 | Discharge: 2018-09-21 | Disposition: A | Discharge status: Home / Self Care | Payer: Medicaid Other | Source: Ambulatory Visit | Attending: Radiation Oncology | Admitting: Radiation Oncology

## 2018-09-21 DIAGNOSIS — Z510 Encounter for antineoplastic radiation therapy: Secondary | ICD-10-CM | POA: Diagnosis not present

## 2018-09-21 MED ORDER — LIDOCAINE VISCOUS HCL 2 % MT SOLN: 5.0000 mL | OROMUCOSAL | 2 refills | Status: DC | PRN

## 2018-09-21 MED FILL — LIDOCAINE 2% VISCOUS SOLN: 2 | 10 days supply | Qty: 100 | Fill #0

## 2018-09-21 NOTE — Telephone Encounter (Signed)
Received call from patient requesting refill on his lidocaine 2% solution for mouth sores/dusphagia. Pt had this filled on 09/18/18 for 100 mls and has been using every day and will not have enough for the weekend.  Refill sent to [facility]

## 2018-09-21 NOTE — Progress Notes (Signed)
[facility] Progress Note   Anthony [name] [id number] Apr 26, 1971 48 y.o.  Anthony [name] is managed by Dr. Eilleen Kempf  Actively treated with chemotherapy/immunotherapy/hormonal therapy: yes  Current therapy: Taxotere and Cyramza  Last treated: 09/11/2018 (Cycle 1 Day 1)  Next scheduled appointment with provider: 10/02/2018  Assessment: Plan:    Drug-induced neutropenia ([place]) - Plan: Tbo-Filgrastim (GRANIX) injection 480 mcg, DISCONTINUED: Tbo-Filgrastim (GRANIX) injection 480 mcg, DISCONTINUED: Tbo-Filgrastim (GRANIX) injection 480 mcg  Stage 4 lung cancer, right ([place]) - Plan: Tbo-Filgrastim (GRANIX) injection 480 mcg, DISCONTINUED: Tbo-Filgrastim (GRANIX) injection 480 mcg  Oral candidiasis   1) 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  He is scheduled to see Dr. [name] on 10/02/2018.  He continues with daily radiation.  2) Neutropenia:  A CBC returned today with a WBC of 1/13 and an ANC of 0.31.  The patient no showed for his Udenyca injection.  He will receive 3 doses on Granix 480 mcg beginning today.  3) Thrush:  The patient was told to continue magic mouth wash.  He was given a prescription for Diflucan and viscous lidocaine.   Please see After Visit Summary for patient specific instructions.  Future Appointments  Date Time Provider [place]  09/21/2018  3:15 PM [facility] LINAC 1 [facility] None  09/24/2018  3:15 PM [facility] LINAC 1 [facility] None  09/25/2018  1:30 PM [facility] LAB 3 [facility] None  09/25/2018  2:45 PM [facility] LINAC 1 [facility] None  09/26/2018  1:25 PM [facility] LINAC 1 [facility] None  09/27/2018 10:00 AM [facility] LINAC 1 [facility] None  09/27/2018 10:30 AM Kennith Center, LCSW [facility] None  09/28/2018  3:15 PM [facility] LINAC 1 [facility] None  10/01/2018  3:15 PM [facility] LINAC 1 [facility] None  10/02/2018 10:00 AM [facility] LAB 1  [facility] None  10/02/2018 10:30 AM [name], MD [facility] None  10/02/2018 11:30 AM [facility] INFUSION [facility] None  10/02/2018  3:15 PM [facility] LINAC 1 [facility] None  10/03/2018  3:15 PM [facility] LINAC 1 [facility] None  10/04/2018  3:15 PM [facility] LINAC 1 [facility] None  10/05/2018  3:15 PM [facility] LINAC 1 [facility] None  10/08/2018  2:45 PM [facility] LINAC 1 [facility] None  10/09/2018  1:30 PM [facility] LAB 6 [facility] None  10/16/2018  1:30 PM [facility] LAB 6 [facility] None  10/23/2018  9:30 AM [facility] LAB 6 [facility] None  10/23/2018 10:00 AM Heilingoetter, Cassandra L, PA-C [facility] None  10/23/2018 11:30 AM [facility] INFUSION [facility] None  10/30/2018  1:30 PM [facility] LAB 6 [facility] None  11/06/2018  1:30 PM [facility] LAB 6 [facility] None  11/13/2018 10:30 AM [facility] LAB 6 [facility] None  11/13/2018 11:00 AM [name], MD [facility] None  11/13/2018 12:00 PM [facility] INFUSION [facility] None    No orders of the defined types were placed in this encounter.      Subjective:   Patient ID:  Anthony [name] is a 48 y.o. (DOB 09/16/1970) male.  Chief Complaint:  Chief Complaint  Patient presents with  . Mucositis    HPI Anthony [name] is a 48 y.o. male with a metastatic [place] lung cancer, adenocarcinoma who is s/p Cycle 1 of Taxotere and Cyramza dosed on 09/11/2018.  He was supposed to receive Udenyca after his treatment but did not show for his appointment.  He presents today with oral tenderness and thrush.  He is having difficulty with oral intake.  He denies fever, chills, sweats, nausea, vomiting, constipation, or diarrhea.  Medications: I have reviewed the patient's current medications.  Allergies: No Known Allergies  Past Medical History:  Diagnosis Date  . Acid reflux   . History of radiation therapy 09/21/17-10/04/17   C4 spine 30 Gy in 10 fractions, left femur 30 Gy in 10 fractions  . Non-small cell  lung cancer ([place])   . NSTEMI (non-ST elevated myocardial infarction) ([place]) 06/13/2017   [name] 06/14/2017  . Tailbone injury since 1993   "cracked"    Past Surgical History:  Procedure Laterality Date  . CARDIAC CATHETERIZATION  06/14/2017  . CHOLECYSTECTOMY N/A 12/29/2017   Procedure: LAPAROSCOPIC CHOLECYSTECTOMY;  Surgeon: Ralene Ok, MD;  Location: [facility];  Service: General;  Laterality: N/A;  . CHOLECYSTECTOMY, LAPAROSCOPIC N/A 12/29/2017  . CORONARY ARTERY BYPASS GRAFT N/A 06/19/2017   Procedure: CORONARY ARTERY BYPASS GRAFTING (CABG), OFF PUMP, times one using the left internal mammary artery to LAD;  Surgeon: Grace Isaac, MD;  Location: [facility];  Service: Open Heart Surgery;  Laterality: N/A;  . ESOPHAGOGASTRODUODENOSCOPY (EGD) WITH PROPOFOL N/A 11/30/2017   Procedure: ESOPHAGOGASTRODUODENOSCOPY (EGD) WITH PROPOFOL;  Surgeon: Gatha Mayer, MD;  Location: [facility];  Service: Endoscopy;  Laterality: N/A;  . LEFT HEART CATH AND CORONARY ANGIOGRAPHY N/A 06/14/2017   Procedure: LEFT HEART CATH AND CORONARY ANGIOGRAPHY;  Surgeon: Sherren Mocha, MD;  Location: [facility];  Service: Cardiovascular;  Laterality: N/A;  . TEE WITHOUT CARDIOVERSION N/A 06/19/2017   Procedure: TRANSESOPHAGEAL ECHOCARDIOGRAM (TEE);  Surgeon: Grace Isaac, MD;  Location: [facility];  Service: Open Heart Surgery;  Laterality: N/A;  . TONSILLECTOMY  ~ 1977    Family History  Problem Relation Age of Onset  . Alcohol abuse Mother   . Heart disease Father   . Arrhythmia Father   . Aneurysm Maternal Grandmother     Social History   Socioeconomic History  . Marital status: Divorced    Spouse name: Not on file  . Number of children: Not on file  . Years of education: Not on file  . Highest education level: Not on file  Occupational History  . Occupation: Scientist, clinical (histocompatibility and immunogenetics): [employer]  . Financial resource strain: Not on file  . Food insecurity:    Worry: Not on file     Inability: Not on file  . Transportation needs:    Medical: Not on file    Non-medical: Not on file  Tobacco Use  . Smoking status: Never Smoker  . Smokeless tobacco: Former Systems developer    Types: Snuff  Substance and Sexual Activity  . Alcohol use: Yes    Comment: 06/16/2017 "used to drink alot; pretty much stopped~ 01/2015; will have a couple drinks/month now"  . Drug use: No    Comment: 06/14/2017 "nothing since 2000"  . Sexual activity: Yes  Lifestyle  . Physical activity:    Days per week: Not on file    Minutes per session: Not on file  . Stress: Not on file  Relationships  . Social connections:    Talks on phone: Not on file    Gets together: Not on file    Attends religious service: Not on file    Active member of club or organization: Not on file    Attends meetings of clubs or organizations: Not on file    Relationship status: Not on file  . Intimate partner violence:    Fear of current  or ex partner: Not on file    Emotionally abused: Not on file    Physically abused: Not on file    Forced sexual activity: Not on file  Other Topics Concern  . Not on file  Social History Narrative   Divorced   Tool Salesman   + EtOH   Never smoker    Past Medical History, Surgical history, Social history, and Family history were reviewed and updated as appropriate.   Please see review of systems for further details on the patient's review from today.   Review of Systems:  Review of Systems  Constitutional: Positive for appetite change. Negative for chills, diaphoresis and fever.  HENT: Positive for mouth sores and trouble swallowing. Negative for sore throat.   Respiratory: Negative for cough, shortness of breath and wheezing.   Cardiovascular: Negative for chest pain and palpitations.  Gastrointestinal: Negative for constipation, diarrhea, nausea and vomiting.    Objective:   Physical Exam:  BP 119/89 (BP Location: Left Arm, Patient Position: Sitting)   Pulse (!) 108  Comment:  NP aware of pulse  Temp 98.1 F (36.7 C) (Oral)   Resp 18   Ht 5\' 10"  (1.778 m)   Wt 207 lb 12.8 oz (94.3 kg)   SpO2 100%   BMI 29.82 kg/m  ECOG: 1  Physical Exam Constitutional:      General: He is not in acute distress.    Appearance: Normal appearance. He is not ill-appearing, toxic-appearing or diaphoretic.  HENT:     Head: Normocephalic.     Mouth/Throat:     Comments: A thick coating of the tongue is noted consistent with oral candidiasis. Eyes:     General: No scleral icterus.       Right eye: No discharge.        Left eye: No discharge.  Neck:     Musculoskeletal: Normal range of motion and neck supple.  Cardiovascular:     Rate and Rhythm: Normal rate and regular rhythm.     Heart sounds: Normal heart sounds. No murmur. No friction rub. No gallop.   Pulmonary:     Effort: Pulmonary effort is normal. No respiratory distress.     Breath sounds: Normal breath sounds. No stridor. No wheezing or rales.  Chest:     Chest wall: No tenderness.  Abdominal:     General: Bowel sounds are normal. There is no distension.     Tenderness: There is no abdominal tenderness. There is no guarding.  Lymphadenopathy:     Cervical: No cervical adenopathy.  Skin:    General: Skin is warm and dry.  Neurological:     Mental Status: He is alert.     Coordination: Coordination normal.     Gait: Gait normal.     Lab Review:     Component Value Date/Time   NA 135 09/18/2018 1218   NA 141 07/17/2017 1006   K 4.7 09/18/2018 1218   CL 98 09/18/2018 1218   CO2 28 09/18/2018 1218   GLUCOSE 102 (H) 09/18/2018 1218   BUN 19 09/18/2018 1218   BUN 12 07/17/2017 1006   CREATININE 1.02 09/18/2018 1218   CALCIUM 10.0 09/18/2018 1218   PROT 7.6 09/18/2018 1218   PROT 6.2 02/02/2018 0825   ALBUMIN 3.0 (L) 09/18/2018 1218   ALBUMIN 4.1 02/02/2018 0825   AST 38 09/18/2018 1218   ALT 52 (H) 09/18/2018 1218   ALKPHOS 133 (H) 09/18/2018 1218   BILITOT 1.0 09/18/2018 1218  GFRNONAA >60 09/18/2018 1218   GFRAA >60 09/18/2018 1218       Component Value Date/Time   WBC 1.1 (L) 09/18/2018 1218   WBC 4.1 12/01/2017 0937   RBC 4.40 09/18/2018 1218   HGB 12.8 (L) 09/18/2018 1218   HGB 14.7 07/17/2017 1006   HCT 39.5 09/18/2018 1218   HCT 42.6 07/17/2017 1006   PLT 300 09/18/2018 1218   PLT 334 07/17/2017 1006   MCV 89.8 09/18/2018 1218   MCV 89 07/17/2017 1006   MCH 29.1 09/18/2018 1218   MCHC 32.4 09/18/2018 1218   RDW 13.5 09/18/2018 1218   RDW 13.6 07/17/2017 1006   LYMPHSABS 0.5 (L) 09/18/2018 1218   LYMPHSABS 2.1 07/17/2017 1006   MONOABS 0.2 09/18/2018 1218   EOSABS 0.0 09/18/2018 1218   EOSABS 0.2 07/17/2017 1006   BASOSABS 0.0 09/18/2018 1218   BASOSABS 0.1 07/17/2017 1006   -------------------------------  Imaging from last 24 hours (if applicable):  Radiology interpretation: Dg Chest 1 View  Result Date: 09/14/2018 CLINICAL DATA:  Status post thoracentesis. History of lung carcinoma EXAM: CHEST  1 VIEW COMPARISON:  Chest CT September 03, 2018 FINDINGS: No pneumothorax. There is a moderate loculated pleural effusion on the right. There is atelectatic change in the right mid and lower lung zones. Left lung clear. Heart size and pulmonary vascularity are normal. Patient is status post median sternotomy. No adenopathy. No bone lesions. IMPRESSION: No pneumothorax. Moderate pleural effusion on the right which appears loculated. Atelectatic change on the right noted. Left lung clear. Heart size normal. Electronically Signed   By: Lowella Grip III M.D.   On: 09/14/2018 11:48   Dg Abd 1 View  Result Date: 08/30/2018 CLINICAL DATA:  Lower back and abdomen pain. EXAM: ABDOMEN - 1 VIEW COMPARISON:  None. FINDINGS: The bowel gas pattern is normal. Moderate bowel content is identified throughout colon. No radio-opaque calculi or other significant radiographic abnormality are seen. IMPRESSION: No bowel obstruction. Moderate bowel content identified  throughout colon, this can be seen in constipation. Electronically Signed   By: Abelardo Diesel M.D.   On: 08/30/2018 15:21   Ct Chest W Contrast  Result Date: 09/03/2018 CLINICAL DATA:  Restaging lung cancer. EXAM: CT CHEST, ABDOMEN, AND PELVIS WITH CONTRAST TECHNIQUE: Multidetector CT imaging of the chest, abdomen and pelvis was performed following the standard protocol during bolus administration of intravenous contrast. CONTRAST:  135mL OMNIPAQUE IOHEXOL 300 MG/ML  SOLN COMPARISON:  07/02/2018 FINDINGS: CT CHEST FINDINGS Cardiovascular: The heart size appears normal. Previous median sternotomy and CABG procedure. No pericardial effusion. Mediastinum/Nodes: Normal appearance of the thyroid gland. The trachea appears patent and is midline. Normal appearance of the esophagus. No supraclavicular or axillary adenopathy. There are 2 new right CP angle lymph nodes which measure up to 1.6 cm, image 37/2. At the level of the hiatus there is a new lymph node anterior to the esophagus and medial to the inferior cavoatrial junction measuring 1.5 cm, image 42/2. Lungs/Pleura: Increase in volume of right pleural effusion. The dominant mass at the right lung base demonstrates significant interval increase in size in the interval and now involves the diaphragm and dome of liver. This measures 8.5 by 5.7 by 6.4 cm, image 43/2 and image 102/4. Previously 5.0 x 1.3 by 1.2 cm. Multiple new pleural nodules are identified within the right hemithorax. The largest is in the posterior costophrenic angle measuring 4.1 by 2.6 by 3.5 cm and exhibits chest wall extension. Perifissural lesion within the anteromedial aspect  of the base of right upper lobe measures 1.6 x 1.2 cm, image 69/4. Previously 1.1 x 1.3 cm. Musculoskeletal: New lucent lesion within T1 vertebra measures 7 mm, image 15/4. The new lucent lesion within T6 vertebra measures 1.2 cm, image 16/6. Subtle increase sclerosis within the T4 vertebra noted, suspicious for  metastasis, image 120/6. Very subtle lucent lesion within the body of sternum measures 1.5 cm, image 114/6. Previously 1.2 cm. CT ABDOMEN PELVIS FINDINGS Hepatobiliary: New lesion within segment 4a measures 1.8 cm. The previously noted segment 4a lesion measures 2.4 cm, image 49/2. Previously 1.4 cm. New segment 6 lesion measures 0.7 cm, image 70/2. Previous cholecystectomy. Pancreas: Unremarkable. No pancreatic ductal dilatation or surrounding inflammatory changes. Spleen: Normal in size without focal abnormality. Adrenals/Urinary Tract: Normal adrenal glands. There is a subtle striated nephrographic appearance of both kidneys. No distinct mass or hydronephrosis identified. Urinary bladder appears normal. Stomach/Bowel: Stomach is within normal limits. Appendix appears normal. No evidence of bowel wall thickening, distention, or inflammatory changes. Numerous colonic diverticula identified without acute inflammation. Vascular/Lymphatic: Aortic atherosclerosis. No aneurysm. No abdominopelvic adenopathy. Reproductive: Prostate is unremarkable. Other: No free fluid or fluid collections. Musculoskeletal: There is a lytic lesion involving the superior endplate of the L2 vertebral body which appears new from previous exam. Also new is a lucent lesion involving the L4 vertebral body, image 107/6. IMPRESSION: 1. Interval progression of disease. There has been considerable increase in size right lung base mass which now invades the right hemidiaphragm and dome of liver. The right pleural effusion is increased in size and there are multiple new pleural nodules throughout the right hemithorax. New right CP angle and posterior mediastinal adenopathy. Progression of liver metastasis. New multifocal lytic bone metastases. 2. Subtle striated nephrographic appearance of both kidneys which is nonspecific but may be associated with pyelonephritis as well as embolic disease. Metastasis to the kidneys is considered less favored.  Electronically Signed   By: Kerby Moors M.D.   On: 09/03/2018 09:15   Mr Lumbar Spine W Wo Contrast  Result Date: 08/30/2018 CLINICAL DATA:  Metastatic non-small cell lung cancer with progressive constipation and urinary hesitancy. Back pain EXAM: MRI LUMBAR SPINE WITHOUT AND WITH CONTRAST TECHNIQUE: Multiplanar and multiecho pulse sequences of the lumbar spine were obtained without and with intravenous contrast. CONTRAST:  10 mL Gadovist IV COMPARISON:  CT abdomen pelvis 07/02/2018 FINDINGS: Segmentation:  Normal Alignment:  Normal Vertebrae: Multiple enhancing metastatic deposits throughout the lumbar spine. Metastatic disease also in the sacrum and T11 and T12 vertebral body. These are not definitely seen on the prior CT and likely have progressed in the interval. Negative for fracture. Conus medullaris and cauda equina: Conus extends to the L1-2 level. Conus and cauda equina appear normal. Paraspinal and other soft tissues: Expansile mass in the region of the right twelfth rib compatible with metastatic disease. This is not seen on the prior CT. Paraspinous soft tissue mass to the right of L5 measures 10 x 20 mm compatible with metastatic disease extension from the L5 vertebral body. This was not present on the prior CT. Disc levels: Small central disc protrusion at L5-S1. No significant spinal stenosis in the lumbar spine. IMPRESSION: Multiple metastatic deposits throughout the lower thoracic and lumbar spine with progression from the CT of 07/02/2018. No fracture or epidural tumor. Paraspinous soft tissue mass due to tumor extension to the right of L5. Metastatic expansile lesion to the right twelfth rib not seen on the prior study. Electronically Signed   By:  Franchot Gallo M.D.   On: 08/30/2018 16:52   Ct Abdomen Pelvis W Contrast  Result Date: 09/03/2018 CLINICAL DATA:  Restaging lung cancer. EXAM: CT CHEST, ABDOMEN, AND PELVIS WITH CONTRAST TECHNIQUE: Multidetector CT imaging of the chest,  abdomen and pelvis was performed following the standard protocol during bolus administration of intravenous contrast. CONTRAST:  122mL OMNIPAQUE IOHEXOL 300 MG/ML  SOLN COMPARISON:  07/02/2018 FINDINGS: CT CHEST FINDINGS Cardiovascular: The heart size appears normal. Previous median sternotomy and CABG procedure. No pericardial effusion. Mediastinum/Nodes: Normal appearance of the thyroid gland. The trachea appears patent and is midline. Normal appearance of the esophagus. No supraclavicular or axillary adenopathy. There are 2 new right CP angle lymph nodes which measure up to 1.6 cm, image 37/2. At the level of the hiatus there is a new lymph node anterior to the esophagus and medial to the inferior cavoatrial junction measuring 1.5 cm, image 42/2. Lungs/Pleura: Increase in volume of right pleural effusion. The dominant mass at the right lung base demonstrates significant interval increase in size in the interval and now involves the diaphragm and dome of liver. This measures 8.5 by 5.7 by 6.4 cm, image 43/2 and image 102/4. Previously 5.0 x 1.3 by 1.2 cm. Multiple new pleural nodules are identified within the right hemithorax. The largest is in the posterior costophrenic angle measuring 4.1 by 2.6 by 3.5 cm and exhibits chest wall extension. Perifissural lesion within the anteromedial aspect of the base of right upper lobe measures 1.6 x 1.2 cm, image 69/4. Previously 1.1 x 1.3 cm. Musculoskeletal: New lucent lesion within T1 vertebra measures 7 mm, image 15/4. The new lucent lesion within T6 vertebra measures 1.2 cm, image 16/6. Subtle increase sclerosis within the T4 vertebra noted, suspicious for metastasis, image 120/6. Very subtle lucent lesion within the body of sternum measures 1.5 cm, image 114/6. Previously 1.2 cm. CT ABDOMEN PELVIS FINDINGS Hepatobiliary: New lesion within segment 4a measures 1.8 cm. The previously noted segment 4a lesion measures 2.4 cm, image 49/2. Previously 1.4 cm. New segment 6  lesion measures 0.7 cm, image 70/2. Previous cholecystectomy. Pancreas: Unremarkable. No pancreatic ductal dilatation or surrounding inflammatory changes. Spleen: Normal in size without focal abnormality. Adrenals/Urinary Tract: Normal adrenal glands. There is a subtle striated nephrographic appearance of both kidneys. No distinct mass or hydronephrosis identified. Urinary bladder appears normal. Stomach/Bowel: Stomach is within normal limits. Appendix appears normal. No evidence of bowel wall thickening, distention, or inflammatory changes. Numerous colonic diverticula identified without acute inflammation. Vascular/Lymphatic: Aortic atherosclerosis. No aneurysm. No abdominopelvic adenopathy. Reproductive: Prostate is unremarkable. Other: No free fluid or fluid collections. Musculoskeletal: There is a lytic lesion involving the superior endplate of the L2 vertebral body which appears new from previous exam. Also new is a lucent lesion involving the L4 vertebral body, image 107/6. IMPRESSION: 1. Interval progression of disease. There has been considerable increase in size right lung base mass which now invades the right hemidiaphragm and dome of liver. The right pleural effusion is increased in size and there are multiple new pleural nodules throughout the right hemithorax. New right CP angle and posterior mediastinal adenopathy. Progression of liver metastasis. New multifocal lytic bone metastases. 2. Subtle striated nephrographic appearance of both kidneys which is nonspecific but may be associated with pyelonephritis as well as embolic disease. Metastasis to the kidneys is considered less favored. Electronically Signed   By: Kerby Moors M.D.   On: 09/03/2018 09:15   Dg Hip Unilat W Or W/o Pelvis 1 View Right  Result Date: 08/30/2018 CLINICAL DATA:  Pelvic pain.  Stage IV lung cancer. EXAM: DG HIP (WITH OR WITHOUT PELVIS) 1V RIGHT COMPARISON:  None. FINDINGS: There is no evidence of hip fracture or  dislocation. There is no evidence of arthropathy or other focal bone abnormality. IMPRESSION: No acute abnormality. Electronically Signed   By: Abelardo Diesel M.D.   On: 08/30/2018 15:24   US Thoracentesis Asp Pleural Space W/img Guide  Result Date: 09/14/2018 INDICATION: Patient with history of stage IV right lung cancer, dyspnea, right pleural effusion. Request made for diagnostic and therapeutic right thoracentesis. EXAM: ULTRASOUND GUIDED DIAGNOSTIC AND THERAPEUTIC RIGHT THORACENTESIS MEDICATIONS: None COMPLICATIONS: None immediate. PROCEDURE: An ultrasound guided thoracentesis was thoroughly discussed with the patient and questions answered. The benefits, risks, alternatives and complications were also discussed. The patient understands and wishes to proceed with the procedure. Written consent was obtained. Ultrasound was performed to localize and mark an adequate pocket of fluid in the right chest. The area was then prepped and draped in the normal sterile fashion. 1% Lidocaine was used for local anesthesia. Under ultrasound guidance a 6 Fr Safe-T-Centesis catheter was introduced. Thoracentesis was performed. The catheter was removed and a dressing applied. FINDINGS: A total of approximately 350 cc of dark, bloody fluid was removed. Samples were sent to the laboratory as requested by the clinical team. The pleural fluid collection was multiloculated and only the above amount of fluid could be aspirated today. IMPRESSION: Successful ultrasound guided diagnostic and therapeutic right thoracentesis yielding 350 cc of pleural fluid. Read by: Rowe Robert, PA-C Electronically Signed   By: Aletta Edouard M.D.   On: 09/14/2018 12:54        This case was discussed with Dr. [name]. He expressed agreement with my management of this patient.

## 2018-09-24 ENCOUNTER — Other Ambulatory Visit: Payer: Self-pay | Admitting: Medical Oncology

## 2018-09-24 ENCOUNTER — Ambulatory Visit
Admission: RE | Admit: 2018-09-24 | Discharge: 2018-09-24 | Disposition: A | Discharge status: Home / Self Care | Payer: Medicaid Other | Source: Ambulatory Visit | Attending: Radiation Oncology | Admitting: Radiation Oncology

## 2018-09-24 DIAGNOSIS — Z510 Encounter for antineoplastic radiation therapy: Secondary | ICD-10-CM | POA: Diagnosis not present

## 2018-09-24 DIAGNOSIS — C3491 Malignant neoplasm of unspecified part of right bronchus or lung: Secondary | ICD-10-CM

## 2018-09-25 ENCOUNTER — Ambulatory Visit: Payer: Self-pay | Admitting: Internal Medicine

## 2018-09-25 ENCOUNTER — Inpatient Hospital Stay: Payer: Medicaid Other

## 2018-09-25 ENCOUNTER — Ambulatory Visit: Payer: Self-pay

## 2018-09-25 ENCOUNTER — Ambulatory Visit
Admission: RE | Admit: 2018-09-25 | Discharge: 2018-09-25 | Disposition: A | Discharge status: Home / Self Care | Payer: Medicaid Other | Source: Ambulatory Visit | Attending: Radiation Oncology | Admitting: Radiation Oncology

## 2018-09-25 ENCOUNTER — Other Ambulatory Visit: Payer: Self-pay

## 2018-09-25 DIAGNOSIS — Z5112 Encounter for antineoplastic immunotherapy: Secondary | ICD-10-CM | POA: Diagnosis not present

## 2018-09-25 DIAGNOSIS — Z510 Encounter for antineoplastic radiation therapy: Secondary | ICD-10-CM | POA: Diagnosis not present

## 2018-09-25 DIAGNOSIS — C3491 Malignant neoplasm of unspecified part of right bronchus or lung: Secondary | ICD-10-CM

## 2018-09-25 LAB — CMP (CANCER CENTER ONLY)
ALT: 26 U/L (ref 0–44)
AST: 31 U/L (ref 15–41)
Albumin: 2.9 g/dL — ABNORMAL LOW (ref 3.5–5.0)
Alkaline Phosphatase: 143 U/L — ABNORMAL HIGH (ref 38–126)
Anion gap: 11 (ref 5–15)
BUN: 12 mg/dL (ref 6–20)
CO2: 27 mmol/L (ref 22–32)
Calcium: 9.8 mg/dL (ref 8.9–10.3)
Chloride: 99 mmol/L (ref 98–111)
Creatinine: 0.93 mg/dL (ref 0.61–1.24)
GFR, Est AFR Am: >60 mL/min (ref >60)
GFR, Estimated: >60 mL/min (ref >60)
Glucose, Bld: 98 mg/dL (ref 70–99)
Potassium: 5.4 mmol/L — ABNORMAL HIGH (ref 3.5–5.1)
SODIUM: 137 mmol/L (ref 135–145)
Total Bilirubin: 0.6 mg/dL (ref 0.3–1.2)
Total Protein: 7.3 g/dL (ref 6.5–8.1)

## 2018-09-25 LAB — CBC WITH DIFFERENTIAL (CANCER CENTER ONLY)
Abs Immature Granulocytes: 0.11 10*3/uL — ABNORMAL HIGH (ref 0.00–0.07)
Basophils Absolute: 0.1 10*3/uL (ref 0.0–0.1)
Basophils Relative: 1 %
Eosinophils Absolute: 0 10*3/uL (ref 0.0–0.5)
Eosinophils Relative: 0 %
HCT: 38.1 % — ABNORMAL LOW (ref 39.0–52.0)
Hemoglobin: 12.1 g/dL — ABNORMAL LOW (ref 13.0–17.0)
IMMATURE GRANULOCYTES: 2 %
Lymphocytes Relative: 15 %
Lymphs Abs: 1 10*3/uL (ref 0.7–4.0)
MCH: 28.8 pg (ref 26.0–34.0)
MCHC: 31.8 g/dL (ref 30.0–36.0)
MCV: 90.7 fL (ref 80.0–100.0)
Monocytes Absolute: 0.7 10*3/uL (ref 0.1–1.0)
Monocytes Relative: 11 %
Neutro Abs: 4.5 10*3/uL (ref 1.7–7.7)
Neutrophils Relative %: 71 %
Platelet Count: 238 10*3/uL (ref 150–400)
RBC: 4.2 MIL/uL — ABNORMAL LOW (ref 4.22–5.81)
RDW: 14.4 % (ref 11.5–15.5)
WBC Count: 6.3 10*3/uL (ref 4.0–10.5)
nRBC: 0 % (ref 0.0–0.2)

## 2018-09-26 ENCOUNTER — Ambulatory Visit
Admission: RE | Admit: 2018-09-26 | Discharge: 2018-09-26 | Disposition: A | Discharge status: Home / Self Care | Payer: Medicaid Other | Source: Ambulatory Visit | Attending: Radiation Oncology | Admitting: Radiation Oncology

## 2018-09-26 ENCOUNTER — Telehealth: Payer: Self-pay | Admitting: *Deleted

## 2018-09-26 DIAGNOSIS — Z510 Encounter for antineoplastic radiation therapy: Secondary | ICD-10-CM | POA: Diagnosis not present

## 2018-09-26 DIAGNOSIS — K1379 Other lesions of oral mucosa: Secondary | ICD-10-CM

## 2018-09-26 MED ORDER — MAGIC MOUTHWASH: 5.0000 mL | Freq: Four times a day (QID) | ORAL | 1 refills | Status: DC

## 2018-09-26 MED ORDER — FLUCONAZOLE 100 MG PO TABS: 100.0000 mg | ORAL_TABLET | Freq: Every day | ORAL | 0 refills | Status: DC

## 2018-09-26 MED FILL — FLUCONAZOLE 100 MG TAB: 100 | 7 days supply | Qty: 7 | Fill #0

## 2018-09-26 NOTE — Telephone Encounter (Signed)
Pt called regarding yeast in mouth,. Reviewed with MD, Rx for Diflucan and refill on magic mouthwash sent to [facility]. Pt notified.

## 2018-09-27 ENCOUNTER — Inpatient Hospital Stay: Payer: Medicaid Other | Admitting: *Deleted

## 2018-09-27 ENCOUNTER — Ambulatory Visit
Admission: RE | Admit: 2018-09-27 | Discharge: 2018-09-27 | Disposition: A | Discharge status: Home / Self Care | Payer: Medicaid Other | Source: Ambulatory Visit | Attending: Radiation Oncology | Admitting: Radiation Oncology

## 2018-09-27 DIAGNOSIS — C3491 Malignant neoplasm of unspecified part of right bronchus or lung: Secondary | ICD-10-CM

## 2018-09-27 DIAGNOSIS — Z510 Encounter for antineoplastic radiation therapy: Secondary | ICD-10-CM | POA: Diagnosis not present

## 2018-09-27 NOTE — Progress Notes (Signed)
[place] Work  Clinical Social Work met with patient for counseling session today.  Reviewed goals and discussed how goals and needs adjust based on cancer side effects, changes in treatment, etc.  CSW and patient scheduled follow up appointment.    [name], LCSW  Clinical Social Worker [facility]

## 2018-09-28 ENCOUNTER — Ambulatory Visit
Admission: RE | Admit: 2018-09-28 | Discharge: 2018-09-28 | Disposition: A | Discharge status: Home / Self Care | Payer: Medicaid Other | Source: Ambulatory Visit | Attending: Radiation Oncology | Admitting: Radiation Oncology

## 2018-09-28 DIAGNOSIS — Z510 Encounter for antineoplastic radiation therapy: Secondary | ICD-10-CM | POA: Diagnosis not present

## 2018-10-01 ENCOUNTER — Ambulatory Visit
Admission: RE | Admit: 2018-10-01 | Discharge: 2018-10-01 | Disposition: A | Discharge status: Home / Self Care | Payer: Medicaid Other | Source: Ambulatory Visit | Attending: Radiation Oncology | Admitting: Radiation Oncology

## 2018-10-01 ENCOUNTER — Other Ambulatory Visit: Payer: Self-pay | Admitting: Medical Oncology

## 2018-10-01 DIAGNOSIS — Z510 Encounter for antineoplastic radiation therapy: Secondary | ICD-10-CM | POA: Diagnosis present

## 2018-10-01 DIAGNOSIS — C3491 Malignant neoplasm of unspecified part of right bronchus or lung: Secondary | ICD-10-CM

## 2018-10-01 DIAGNOSIS — C7951 Secondary malignant neoplasm of bone: Secondary | ICD-10-CM | POA: Diagnosis not present

## 2018-10-02 ENCOUNTER — Ambulatory Visit
Admission: RE | Admit: 2018-10-02 | Discharge: 2018-10-02 | Disposition: A | Discharge status: Home / Self Care | Payer: Medicaid Other | Source: Ambulatory Visit | Attending: Radiation Oncology | Admitting: Radiation Oncology

## 2018-10-02 ENCOUNTER — Encounter: Payer: Self-pay | Admitting: Internal Medicine

## 2018-10-02 ENCOUNTER — Inpatient Hospital Stay: Payer: Medicaid Other

## 2018-10-02 ENCOUNTER — Telehealth: Payer: Self-pay | Admitting: Internal Medicine

## 2018-10-02 ENCOUNTER — Inpatient Hospital Stay: Payer: Medicaid Other | Attending: Internal Medicine

## 2018-10-02 ENCOUNTER — Inpatient Hospital Stay (HOSPITAL_BASED_OUTPATIENT_CLINIC_OR_DEPARTMENT_OTHER): Payer: Medicaid Other | Admitting: Internal Medicine

## 2018-10-02 VITALS — BP 119/84 | HR 92

## 2018-10-02 VITALS — BP 120/91 | HR 101 | Temp 98.9°F | Resp 18 | Ht 70.0 in | Wt 196.5 lb

## 2018-10-02 DIAGNOSIS — K219 Gastro-esophageal reflux disease without esophagitis: Secondary | ICD-10-CM | POA: Diagnosis not present

## 2018-10-02 DIAGNOSIS — Z7982 Long term (current) use of aspirin: Secondary | ICD-10-CM

## 2018-10-02 DIAGNOSIS — R634 Abnormal weight loss: Secondary | ICD-10-CM

## 2018-10-02 DIAGNOSIS — Z5111 Encounter for antineoplastic chemotherapy: Secondary | ICD-10-CM | POA: Diagnosis not present

## 2018-10-02 DIAGNOSIS — K1230 Oral mucositis (ulcerative), unspecified: Secondary | ICD-10-CM | POA: Insufficient documentation

## 2018-10-02 DIAGNOSIS — C3411 Malignant neoplasm of upper lobe, right bronchus or lung: Secondary | ICD-10-CM

## 2018-10-02 DIAGNOSIS — I252 Old myocardial infarction: Secondary | ICD-10-CM | POA: Insufficient documentation

## 2018-10-02 DIAGNOSIS — Z923 Personal history of irradiation: Secondary | ICD-10-CM | POA: Diagnosis not present

## 2018-10-02 DIAGNOSIS — Z7689 Persons encountering health services in other specified circumstances: Secondary | ICD-10-CM | POA: Diagnosis not present

## 2018-10-02 DIAGNOSIS — Z79899 Other long term (current) drug therapy: Secondary | ICD-10-CM

## 2018-10-02 DIAGNOSIS — C3491 Malignant neoplasm of unspecified part of right bronchus or lung: Secondary | ICD-10-CM

## 2018-10-02 DIAGNOSIS — Z5112 Encounter for antineoplastic immunotherapy: Secondary | ICD-10-CM | POA: Diagnosis not present

## 2018-10-02 DIAGNOSIS — Z510 Encounter for antineoplastic radiation therapy: Secondary | ICD-10-CM | POA: Diagnosis not present

## 2018-10-02 DIAGNOSIS — C787 Secondary malignant neoplasm of liver and intrahepatic bile duct: Secondary | ICD-10-CM | POA: Diagnosis not present

## 2018-10-02 DIAGNOSIS — R5383 Other fatigue: Secondary | ICD-10-CM

## 2018-10-02 DIAGNOSIS — C7951 Secondary malignant neoplasm of bone: Secondary | ICD-10-CM | POA: Diagnosis not present

## 2018-10-02 DIAGNOSIS — M25551 Pain in right hip: Secondary | ICD-10-CM | POA: Diagnosis not present

## 2018-10-02 LAB — CBC WITH DIFFERENTIAL (CANCER CENTER ONLY)
ABS IMMATURE GRANULOCYTES: 0.08 10*3/uL — AB (ref 0.00–0.07)
BASOS PCT: 0 %
Basophils Absolute: 0 10*3/uL (ref 0.0–0.1)
Eosinophils Absolute: 0 10*3/uL (ref 0.0–0.5)
Eosinophils Relative: 0 %
HCT: 36.7 % — ABNORMAL LOW (ref 39.0–52.0)
Hemoglobin: 12.2 g/dL — ABNORMAL LOW (ref 13.0–17.0)
Immature Granulocytes: 1 %
Lymphocytes Relative: 4 %
Lymphs Abs: 0.6 10*3/uL — ABNORMAL LOW (ref 0.7–4.0)
MCH: 29 pg (ref 26.0–34.0)
MCHC: 33.2 g/dL (ref 30.0–36.0)
MCV: 87.2 fL (ref 80.0–100.0)
Monocytes Absolute: 0.3 10*3/uL (ref 0.1–1.0)
Monocytes Relative: 2 %
NEUTROS ABS: 13.2 10*3/uL — AB (ref 1.7–7.7)
Neutrophils Relative %: 93 %
Platelet Count: 271 10*3/uL (ref 150–400)
RBC: 4.21 MIL/uL — ABNORMAL LOW (ref 4.22–5.81)
RDW: 14.4 % (ref 11.5–15.5)
WBC Count: 14.2 10*3/uL — ABNORMAL HIGH (ref 4.0–10.5)
nRBC: 0 % (ref 0.0–0.2)

## 2018-10-02 LAB — CMP (CANCER CENTER ONLY)
ALT: 21 U/L (ref 0–44)
AST: 19 U/L (ref 15–41)
Albumin: 3.2 g/dL — ABNORMAL LOW (ref 3.5–5.0)
Alkaline Phosphatase: 124 U/L (ref 38–126)
Anion gap: 15 (ref 5–15)
BUN: 14 mg/dL (ref 6–20)
CO2: 22 mmol/L (ref 22–32)
Calcium: 10.8 mg/dL — ABNORMAL HIGH (ref 8.9–10.3)
Chloride: 103 mmol/L (ref 98–111)
Creatinine: 1.03 mg/dL (ref 0.61–1.24)
GFR, Est AFR Am: >60 mL/min (ref >60)
Glucose, Bld: 155 mg/dL — ABNORMAL HIGH (ref 70–99)
POTASSIUM: 4.3 mmol/L (ref 3.5–5.1)
Sodium: 140 mmol/L (ref 135–145)
TOTAL PROTEIN: 8 g/dL (ref 6.5–8.1)
Total Bilirubin: 0.5 mg/dL (ref 0.3–1.2)

## 2018-10-02 MED ORDER — SODIUM CHLORIDE 0.9 % IV SOLN
75.0000 mg/m2 | Freq: Once | INTRAVENOUS | Status: AC
  Administered 2018-10-02: 160 mg via INTRAVENOUS
  Filled 2018-10-02: qty 16

## 2018-10-02 MED ORDER — DEXAMETHASONE SODIUM PHOSPHATE 10 MG/ML IJ SOLN
10.0000 mg | Freq: Once | INTRAMUSCULAR | Status: AC
  Administered 2018-10-02: 10 mg via INTRAVENOUS

## 2018-10-02 MED ORDER — DEXAMETHASONE SODIUM PHOSPHATE 10 MG/ML IJ SOLN
INTRAMUSCULAR | Status: AC
  Filled 2018-10-02: qty 1

## 2018-10-02 MED ORDER — SODIUM CHLORIDE 0.9 % IV SOLN
900.0000 mg | Freq: Once | INTRAVENOUS | Status: AC
  Administered 2018-10-02: 900 mg via INTRAVENOUS
  Filled 2018-10-02: qty 50

## 2018-10-02 MED ORDER — SODIUM CHLORIDE 0.9 % IV SOLN
20.0000 mg | Freq: Once | INTRAVENOUS | Status: AC
  Administered 2018-10-02: 20 mg via INTRAVENOUS
  Filled 2018-10-02: qty 2

## 2018-10-02 MED ORDER — SODIUM CHLORIDE 0.9 % IV SOLN
Freq: Once | INTRAVENOUS | Status: AC
  Administered 2018-10-02: 12:00:00 via INTRAVENOUS
  Filled 2018-10-02: qty 250

## 2018-10-02 MED ORDER — DIPHENHYDRAMINE HCL 50 MG/ML IJ SOLN
INTRAMUSCULAR | Status: AC
  Filled 2018-10-02: qty 1

## 2018-10-02 MED ORDER — ACETAMINOPHEN 325 MG PO TABS
ORAL_TABLET | ORAL | Status: AC
  Filled 2018-10-02: qty 2

## 2018-10-02 MED ORDER — ACETAMINOPHEN 325 MG PO TABS
650.0000 mg | ORAL_TABLET | Freq: Once | ORAL | Status: AC
  Administered 2018-10-02: 650 mg via ORAL

## 2018-10-02 MED ORDER — DIPHENHYDRAMINE HCL 50 MG/ML IJ SOLN
50.0000 mg | Freq: Once | INTRAMUSCULAR | Status: AC
  Administered 2018-10-02: 50 mg via INTRAVENOUS

## 2018-10-02 MED ORDER — DIPHENHYDRAMINE HCL 25 MG PO CAPS
ORAL_CAPSULE | ORAL | Status: AC
  Filled 2018-10-02: qty 2

## 2018-10-02 NOTE — Patient Instructions (Signed)
[facility] Discharge Instructions for Patients Receiving Chemotherapy  Today you received the following chemotherapy agents cyramza/taxotere   To help prevent nausea and vomiting after your treatment, we encourage you to take your nausea medication as directed If you develop nausea and vomiting that is not controlled by your nausea medication, call the clinic.   BELOW ARE SYMPTOMS THAT SHOULD BE REPORTED IMMEDIATELY:  *FEVER GREATER THAN 100.5 F  *CHILLS WITH OR WITHOUT FEVER  NAUSEA AND VOMITING THAT IS NOT CONTROLLED WITH YOUR NAUSEA MEDICATION  *UNUSUAL SHORTNESS OF BREATH  *UNUSUAL BRUISING OR BLEEDING  TENDERNESS IN MOUTH AND THROAT WITH OR WITHOUT PRESENCE OF ULCERS  *URINARY PROBLEMS  *BOWEL PROBLEMS  UNUSUAL RASH Items with * indicate a potential emergency and should be followed up as soon as possible.  Feel free to call the clinic you have any questions or concerns. The clinic phone number is [phone].

## 2018-10-02 NOTE — Progress Notes (Signed)
[facility] [phone]   [phone]  OFFICE PROGRESS NOTE  Anthony Cha, MD [address] [place] [address]  DIAGNOSIS: Stage IVB(T1b, N2, M1c)non-small cell lung cancer, adenocarcinoma presented with right upper lobe lung nodule in addition to right hilar and mediastinal lymphadenopathy as well as metastatic liver and bone disease diagnosed in February 2019.  Biomarker Findings Microsatellite status - MS-Stable Tumor Mutational Burden - TMB-Low (4 Muts/Mb) Genomic Findings For a complete list of the genes assayed, please refer to the Appendix. EGFR exon 20 insertion ([id number]) RB1 loss exons 9-17 TP53 [id number] 7 Disease relevant genes with no reportable alterations: KRAS, ALK, BRAF, MET, RET, ERBB2, ROS1  PDL 1 expression 5%  PRIOR THERAPY:  1) Palliative radiotherapy to the metastatic bone lesions in the cervical spine and left femur under the care of Dr. Sondra Come. 2) Systemic chemotherapy with carboplatin for AUC of 5, Alimta 500 mg/M2 and Keytruda 200 mg IV every 3 weeks.  First dose October 04, 2017.  Status post 3 cycles. 3) palliative stereotactic radiotherapy to the progressive liver lesion. 4)  Maintenance treatment with Alimta 500 mg/M2 and Keytruda 200 mg IV every 3 weeks.  Status post 12 cycles.  Last dose was giving August 14, 2018 discontinued secondary to disease progression.  CURRENT THERAPY: Systemic chemotherapy with docetaxel 75 mg/M2 and Cyramza 10 mg/KG every 3 weeks with Neulasta support.  First dose September 11, 2018.  Status post 1 cycle.  INTERVAL HISTORY: Anthony [name] Marzo 48 y.o. male returns to the clinic today for follow-up visit.  The patient is feeling fine today with no concerning complaints except for fatigue.  He tolerated the first cycle of his treatment well except for the mouth sores and oral thrush.  He was treated with Diflucan as well as nonalcoholic Magic mouthwash.  He lost more  than 10 pounds since his last visit.  He denied having any current chest pain, shortness of breath, cough or hemoptysis.  The pain in his right hip has improved but not completely resolved.  The patient denied having any headache or visual changes.  He has no fever or chills.  He is here today for evaluation before starting cycle #2 of his treatment.   MEDICAL HISTORY: Past Medical History:  Diagnosis Date  . Acid reflux   . History of radiation therapy 09/21/17-10/04/17   C4 spine 30 Gy in 10 fractions, left femur 30 Gy in 10 fractions  . Non-small cell lung cancer ([facility])   . NSTEMI (non-ST elevated myocardial infarction) ([facility]) 06/13/2017   [name] 06/14/2017  . Tailbone injury since 1993   "cracked"    ALLERGIES:  has No Known Allergies.  MEDICATIONS:  Current Outpatient Medications  Medication Sig Dispense Refill  . acetaminophen (TYLENOL) 500 MG tablet Take 500-1,000 mg by mouth daily as needed for moderate pain or headache.    Marland Kitchen aspirin EC 81 MG EC tablet Take 1 tablet (81 mg total) by mouth daily.    Marland Kitchen atorvastatin (LIPITOR) 40 MG tablet Take 1 tablet (40 mg total) by mouth daily. 90 tablet 3  . cetirizine (ZYRTEC) 10 MG tablet Take 10 mg by mouth daily as needed for allergies.     . cyclobenzaprine (FLEXERIL) 5 MG tablet Take 1 tablet (5 mg total) by mouth 3 (three) times daily as needed for muscle spasms. 30 tablet 0  . dexamethasone (DECADRON) 4 MG tablet 2 tablet p.o. twice daily the day before, day of  and day after the chemotherapy every 3 weeks 40 tablet 0  . esomeprazole (NEXIUM) 20 MG capsule Take 20 mg by mouth every other day.    . fluconazole (DIFLUCAN) 100 MG tablet Take 1 tablet (100 mg total) by mouth daily. 7 tablet 0  . folic acid (FOLVITE) 1 MG tablet Take 1 tablet (1 mg total) by mouth daily. (Patient not taking: Reported on 09/13/2018) 30 tablet 2  . lidocaine (XYLOCAINE) 2 % solution Use as directed 5 mLs in the mouth or throat as needed for mouth pain. Rinse and  spit 100 mL 2  . lisinopril (PRINIVIL,ZESTRIL) 5 MG tablet TAKE 1 TABLET BY MOUTH EVERY DAY 90 tablet 3  . magic mouthwash SOLN Take 5 mLs by mouth 4 (four) times daily. Components hydrocortisone 60 mg, Nystatin 30 ml, Benadryl 12.'5mg'$ /5 ml/ 240 mL 1  . metoprolol tartrate (LOPRESSOR) 25 MG tablet Take 12.5 mg by mouth 2 (two) times daily.    . mirtazapine (REMERON) 30 MG tablet Take 1 tablet (30 mg total) by mouth at bedtime. (Patient not taking: Reported on 08/30/2018) 30 tablet 2  . oxyCODONE-acetaminophen (PERCOCET/ROXICET) 5-325 MG tablet Take 1 tablet by mouth every 6 (six) hours as needed for severe pain. 30 tablet 0  . prochlorperazine (COMPAZINE) 10 MG tablet Take 1 tablet (10 mg total) by mouth every 6 (six) hours as needed for nausea or vomiting. 30 tablet 1   No current facility-administered medications for this visit.     SURGICAL HISTORY:  Past Surgical History:  Procedure Laterality Date  . CARDIAC CATHETERIZATION  06/14/2017  . CHOLECYSTECTOMY N/A 12/29/2017   Procedure: LAPAROSCOPIC CHOLECYSTECTOMY;  Surgeon: Ralene Ok, MD;  Location: [facility];  Service: General;  Laterality: N/A;  . CHOLECYSTECTOMY, LAPAROSCOPIC N/A 12/29/2017  . CORONARY ARTERY BYPASS GRAFT N/A 06/19/2017   Procedure: CORONARY ARTERY BYPASS GRAFTING (CABG), OFF PUMP, times one using the left internal mammary artery to LAD;  Surgeon: Grace Isaac, MD;  Location: [facility];  Service: Open Heart Surgery;  Laterality: N/A;  . ESOPHAGOGASTRODUODENOSCOPY (EGD) WITH PROPOFOL N/A 11/30/2017   Procedure: ESOPHAGOGASTRODUODENOSCOPY (EGD) WITH PROPOFOL;  Surgeon: Gatha Mayer, MD;  Location: [facility];  Service: Endoscopy;  Laterality: N/A;  . LEFT HEART CATH AND CORONARY ANGIOGRAPHY N/A 06/14/2017   Procedure: LEFT HEART CATH AND CORONARY ANGIOGRAPHY;  Surgeon: Sherren Mocha, MD;  Location: [facility];  Service: Cardiovascular;  Laterality: N/A;  . TEE WITHOUT CARDIOVERSION N/A 06/19/2017    Procedure: TRANSESOPHAGEAL ECHOCARDIOGRAM (TEE);  Surgeon: Grace Isaac, MD;  Location: [facility];  Service: Open Heart Surgery;  Laterality: N/A;  . TONSILLECTOMY  ~ 1977    REVIEW OF SYSTEMS:  A comprehensive review of systems was negative except for: Constitutional: positive for fatigue and weight loss Musculoskeletal: positive for bone pain   PHYSICAL EXAMINATION: General appearance: alert, cooperative, fatigued and no distress Head: Normocephalic, without obvious abnormality, atraumatic Neck: no adenopathy, no JVD, supple, symmetrical, trachea midline and thyroid not enlarged, symmetric, no tenderness/mass/nodules Lymph nodes: Cervical, supraclavicular, and axillary nodes normal. Resp: clear to auscultation bilaterally Back: symmetric, no curvature. ROM normal. No CVA tenderness. Cardio: regular rate and rhythm, S1, S2 normal, no murmur, click, rub or gallop GI: soft, non-tender; bowel sounds normal; no masses,  no organomegaly Extremities: extremities normal, atraumatic, no cyanosis or edema  ECOG PERFORMANCE STATUS: 1 - Symptomatic but completely ambulatory  Blood pressure (!) 120/91, pulse (!) 101, temperature 98.9 F (37.2 C), temperature source Oral, resp. rate 18,  height '5\' 10"'$  (1.778 m), weight 196 lb 8 oz (89.1 kg), SpO2 98 %.  LABORATORY DATA: Lab Results  Component Value Date   WBC 14.2 (H) 10/02/2018   HGB 12.2 (L) 10/02/2018   HCT 36.7 (L) 10/02/2018   MCV 87.2 10/02/2018   PLT 271 10/02/2018      Chemistry      Component Value Date/Time   NA 137 09/25/2018 1327   NA 141 07/17/2017 1006   K 5.4 (H) 09/25/2018 1327   CL 99 09/25/2018 1327   CO2 27 09/25/2018 1327   BUN 12 09/25/2018 1327   BUN 12 07/17/2017 1006   CREATININE 0.93 09/25/2018 1327      Component Value Date/Time   CALCIUM 9.8 09/25/2018 1327   ALKPHOS 143 (H) 09/25/2018 1327   AST 31 09/25/2018 1327   ALT 26 09/25/2018 1327   BILITOT 0.6 09/25/2018 1327       RADIOGRAPHIC  STUDIES: Dg Chest 1 View  Result Date: 09/14/2018 CLINICAL DATA:  Status post thoracentesis. History of lung carcinoma EXAM: CHEST  1 VIEW COMPARISON:  Chest CT September 03, 2018 FINDINGS: No pneumothorax. There is a moderate loculated pleural effusion on the right. There is atelectatic change in the right mid and lower lung zones. Left lung clear. Heart size and pulmonary vascularity are normal. Patient is status post median sternotomy. No adenopathy. No bone lesions. IMPRESSION: No pneumothorax. Moderate pleural effusion on the right which appears loculated. Atelectatic change on the right noted. Left lung clear. Heart size normal. Electronically Signed   By: Lowella Grip III M.D.   On: 09/14/2018 11:48   Ct Chest W Contrast  Result Date: 09/03/2018 CLINICAL DATA:  Restaging lung cancer. EXAM: CT CHEST, ABDOMEN, AND PELVIS WITH CONTRAST TECHNIQUE: Multidetector CT imaging of the chest, abdomen and pelvis was performed following the standard protocol during bolus administration of intravenous contrast. CONTRAST:  168m OMNIPAQUE IOHEXOL 300 MG/ML  SOLN COMPARISON:  07/02/2018 FINDINGS: CT CHEST FINDINGS Cardiovascular: The heart size appears normal. Previous median sternotomy and CABG procedure. No pericardial effusion. Mediastinum/Nodes: Normal appearance of the thyroid gland. The trachea appears patent and is midline. Normal appearance of the esophagus. No supraclavicular or axillary adenopathy. There are 2 new right CP angle lymph nodes which measure up to 1.6 cm, image 37/2. At the level of the hiatus there is a new lymph node anterior to the esophagus and medial to the inferior cavoatrial junction measuring 1.5 cm, image 42/2. Lungs/Pleura: Increase in volume of right pleural effusion. The dominant mass at the right lung base demonstrates significant interval increase in size in the interval and now involves the diaphragm and dome of liver. This measures 8.5 by 5.7 by 6.4 cm, image 43/2 and image  102/4. Previously 5.0 x 1.3 by 1.2 cm. Multiple new pleural nodules are identified within the right hemithorax. The largest is in the posterior costophrenic angle measuring 4.1 by 2.6 by 3.5 cm and exhibits chest wall extension. Perifissural lesion within the anteromedial aspect of the base of right upper lobe measures 1.6 x 1.2 cm, image 69/4. Previously 1.1 x 1.3 cm. Musculoskeletal: New lucent lesion within T1 vertebra measures 7 mm, image 15/4. The new lucent lesion within T6 vertebra measures 1.2 cm, image 16/6. Subtle increase sclerosis within the T4 vertebra noted, suspicious for metastasis, image 120/6. Very subtle lucent lesion within the body of sternum measures 1.5 cm, image 114/6. Previously 1.2 cm. CT ABDOMEN PELVIS FINDINGS Hepatobiliary: New lesion within segment 4a measures 1.8  cm. The previously noted segment 4a lesion measures 2.4 cm, image 49/2. Previously 1.4 cm. New segment 6 lesion measures 0.7 cm, image 70/2. Previous cholecystectomy. Pancreas: Unremarkable. No pancreatic ductal dilatation or surrounding inflammatory changes. Spleen: Normal in size without focal abnormality. Adrenals/Urinary Tract: Normal adrenal glands. There is a subtle striated nephrographic appearance of both kidneys. No distinct mass or hydronephrosis identified. Urinary bladder appears normal. Stomach/Bowel: Stomach is within normal limits. Appendix appears normal. No evidence of bowel wall thickening, distention, or inflammatory changes. Numerous colonic diverticula identified without acute inflammation. Vascular/Lymphatic: Aortic atherosclerosis. No aneurysm. No abdominopelvic adenopathy. Reproductive: Prostate is unremarkable. Other: No free fluid or fluid collections. Musculoskeletal: There is a lytic lesion involving the superior endplate of the L2 vertebral body which appears new from previous exam. Also new is a lucent lesion involving the L4 vertebral body, image 107/6. IMPRESSION: 1. Interval progression of  disease. There has been considerable increase in size right lung base mass which now invades the right hemidiaphragm and dome of liver. The right pleural effusion is increased in size and there are multiple new pleural nodules throughout the right hemithorax. New right CP angle and posterior mediastinal adenopathy. Progression of liver metastasis. New multifocal lytic bone metastases. 2. Subtle striated nephrographic appearance of both kidneys which is nonspecific but may be associated with pyelonephritis as well as embolic disease. Metastasis to the kidneys is considered less favored. Electronically Signed   By: Kerby Moors M.D.   On: 09/03/2018 09:15   Ct Abdomen Pelvis W Contrast  Result Date: 09/03/2018 CLINICAL DATA:  Restaging lung cancer. EXAM: CT CHEST, ABDOMEN, AND PELVIS WITH CONTRAST TECHNIQUE: Multidetector CT imaging of the chest, abdomen and pelvis was performed following the standard protocol during bolus administration of intravenous contrast. CONTRAST:  145m OMNIPAQUE IOHEXOL 300 MG/ML  SOLN COMPARISON:  07/02/2018 FINDINGS: CT CHEST FINDINGS Cardiovascular: The heart size appears normal. Previous median sternotomy and CABG procedure. No pericardial effusion. Mediastinum/Nodes: Normal appearance of the thyroid gland. The trachea appears patent and is midline. Normal appearance of the esophagus. No supraclavicular or axillary adenopathy. There are 2 new right CP angle lymph nodes which measure up to 1.6 cm, image 37/2. At the level of the hiatus there is a new lymph node anterior to the esophagus and medial to the inferior cavoatrial junction measuring 1.5 cm, image 42/2. Lungs/Pleura: Increase in volume of right pleural effusion. The dominant mass at the right lung base demonstrates significant interval increase in size in the interval and now involves the diaphragm and dome of liver. This measures 8.5 by 5.7 by 6.4 cm, image 43/2 and image 102/4. Previously 5.0 x 1.3 by 1.2 cm. Multiple new  pleural nodules are identified within the right hemithorax. The largest is in the posterior costophrenic angle measuring 4.1 by 2.6 by 3.5 cm and exhibits chest wall extension. Perifissural lesion within the anteromedial aspect of the base of right upper lobe measures 1.6 x 1.2 cm, image 69/4. Previously 1.1 x 1.3 cm. Musculoskeletal: New lucent lesion within T1 vertebra measures 7 mm, image 15/4. The new lucent lesion within T6 vertebra measures 1.2 cm, image 16/6. Subtle increase sclerosis within the T4 vertebra noted, suspicious for metastasis, image 120/6. Very subtle lucent lesion within the body of sternum measures 1.5 cm, image 114/6. Previously 1.2 cm. CT ABDOMEN PELVIS FINDINGS Hepatobiliary: New lesion within segment 4a measures 1.8 cm. The previously noted segment 4a lesion measures 2.4 cm, image 49/2. Previously 1.4 cm. New segment 6 lesion measures 0.7 cm,  image 70/2. Previous cholecystectomy. Pancreas: Unremarkable. No pancreatic ductal dilatation or surrounding inflammatory changes. Spleen: Normal in size without focal abnormality. Adrenals/Urinary Tract: Normal adrenal glands. There is a subtle striated nephrographic appearance of both kidneys. No distinct mass or hydronephrosis identified. Urinary bladder appears normal. Stomach/Bowel: Stomach is within normal limits. Appendix appears normal. No evidence of bowel wall thickening, distention, or inflammatory changes. Numerous colonic diverticula identified without acute inflammation. Vascular/Lymphatic: Aortic atherosclerosis. No aneurysm. No abdominopelvic adenopathy. Reproductive: Prostate is unremarkable. Other: No free fluid or fluid collections. Musculoskeletal: There is a lytic lesion involving the superior endplate of the L2 vertebral body which appears new from previous exam. Also new is a lucent lesion involving the L4 vertebral body, image 107/6. IMPRESSION: 1. Interval progression of disease. There has been considerable increase in size  right lung base mass which now invades the right hemidiaphragm and dome of liver. The right pleural effusion is increased in size and there are multiple new pleural nodules throughout the right hemithorax. New right CP angle and posterior mediastinal adenopathy. Progression of liver metastasis. New multifocal lytic bone metastases. 2. Subtle striated nephrographic appearance of both kidneys which is nonspecific but may be associated with pyelonephritis as well as embolic disease. Metastasis to the kidneys is considered less favored. Electronically Signed   By: Kerby Moors M.D.   On: 09/03/2018 09:15   US Thoracentesis Asp Pleural Space W/img Guide  Result Date: 09/14/2018 INDICATION: Patient with history of stage IV right lung cancer, dyspnea, right pleural effusion. Request made for diagnostic and therapeutic right thoracentesis. EXAM: ULTRASOUND GUIDED DIAGNOSTIC AND THERAPEUTIC RIGHT THORACENTESIS MEDICATIONS: None COMPLICATIONS: None immediate. PROCEDURE: An ultrasound guided thoracentesis was thoroughly discussed with the patient and questions answered. The benefits, risks, alternatives and complications were also discussed. The patient understands and wishes to proceed with the procedure. Written consent was obtained. Ultrasound was performed to localize and mark an adequate pocket of fluid in the right chest. The area was then prepped and draped in the normal sterile fashion. 1% Lidocaine was used for local anesthesia. Under ultrasound guidance a 6 Fr Safe-T-Centesis catheter was introduced. Thoracentesis was performed. The catheter was removed and a dressing applied. FINDINGS: A total of approximately 350 cc of dark, bloody fluid was removed. Samples were sent to the laboratory as requested by the clinical team. The pleural fluid collection was multiloculated and only the above amount of fluid could be aspirated today. IMPRESSION: Successful ultrasound guided diagnostic and therapeutic right  thoracentesis yielding 350 cc of pleural fluid. Read by: Rowe Robert, PA-C Electronically Signed   By: Aletta Edouard M.D.   On: 09/14/2018 12:54    ASSESSMENT AND PLAN: This is a very pleasant 48 years old white male with stage IV non-small cell lung cancer, adenocarcinoma with resistant EGFR mutation in exon 20 and PDL 1 expression of 5%. The patient completed a course of palliative radiotherapy to the cervical spine as well as left hip under the care of Dr. Sondra Come.Marland Kitchen He completed induction systemic chemotherapy with carboplatin, Alimta and Keytruda status post 3 cycles.  He has no evidence for disease progression after the induction phase of his treatment. The patient was started on maintenance treatment with Alimta and Ketruda (pembrolizumab) status post 12 cycles.  He has been tolerating this treatment well with no concerning complaints.  He had repeat CT scan of the chest, abdomen and pelvis performed recently.  I personally and independently reviewed the scan images and discussed the results with the patient and his  friend today. Unfortunately his scan showed significant evidence for disease progression with enlargement of right lung mass as well as pulmonary nodules in addition to mediastinal lymphadenopathy as well as liver and bone disease. The patient was started on systemic chemotherapy with docetaxel and Cyramza status post 1 cycle.  He tolerated the first cycle of this treatment well except for fatigue as well as oral thrush, lack of appetite and weight loss. He is feeling much better today. I recommended for him to proceed with cycle #2 today as scheduled. We will see him back for follow-up visit in 3 weeks for evaluation before starting cycle #3. The patient was advised to call immediately if he has any concerning symptoms in the interval. The patient voices understanding of current disease status and treatment options and is in agreement with the current care plan. All questions were  answered. The patient knows to call the clinic with any problems, questions or concerns. We can certainly see the patient much sooner if necessary.  Disclaimer: This note was dictated with voice recognition software. Similar sounding words can inadvertently be transcribed and may not be corrected upon review.

## 2018-10-02 NOTE — Telephone Encounter (Signed)
Scheduled appt per 3/3 los - pt to get an updated schedule next visit.

## 2018-10-02 NOTE — Progress Notes (Signed)
Spoke w/ Dr. [name] - add famotidine to pre-meds for flushing w/ first cyramza infusion. Dose adjust cyramza for weight loss.   He is aware of corCa level of ~11.4 - no fluids or treatment for this at this time.   Demetrius Charity, PharmD, [facility] Phone: [phone] 10/02/2018

## 2018-10-03 ENCOUNTER — Ambulatory Visit
Admission: RE | Admit: 2018-10-03 | Discharge: 2018-10-03 | Disposition: A | Discharge status: Home / Self Care | Payer: Medicaid Other | Source: Ambulatory Visit | Attending: Radiation Oncology | Admitting: Radiation Oncology

## 2018-10-03 ENCOUNTER — Inpatient Hospital Stay: Payer: Medicaid Other | Admitting: *Deleted

## 2018-10-03 ENCOUNTER — Inpatient Hospital Stay: Payer: Medicaid Other

## 2018-10-03 VITALS — BP 122/72 | HR 95 | Temp 98.5°F | Resp 18

## 2018-10-03 DIAGNOSIS — C3491 Malignant neoplasm of unspecified part of right bronchus or lung: Secondary | ICD-10-CM

## 2018-10-03 DIAGNOSIS — Z5112 Encounter for antineoplastic immunotherapy: Secondary | ICD-10-CM | POA: Diagnosis not present

## 2018-10-03 DIAGNOSIS — Z510 Encounter for antineoplastic radiation therapy: Secondary | ICD-10-CM | POA: Diagnosis not present

## 2018-10-03 MED ORDER — PEGFILGRASTIM INJECTION 6 MG/0.6ML ~~LOC~~
6.0000 mg | PREFILLED_SYRINGE | Freq: Once | SUBCUTANEOUS | Status: AC
  Administered 2018-10-03: 6 mg via SUBCUTANEOUS

## 2018-10-03 MED ORDER — PEGFILGRASTIM-CBQV 6 MG/0.6ML ~~LOC~~ SOSY: 6.0000 mg | PREFILLED_SYRINGE | Freq: Once | SUBCUTANEOUS | Status: DC

## 2018-10-03 MED ORDER — PEGFILGRASTIM INJECTION 6 MG/0.6ML ~~LOC~~
PREFILLED_SYRINGE | SUBCUTANEOUS | Status: AC
  Filled 2018-10-03: qty 0.6

## 2018-10-03 MED ORDER — PEGFILGRASTIM-CBQV 6 MG/0.6ML ~~LOC~~ SOSY
PREFILLED_SYRINGE | SUBCUTANEOUS | Status: AC
  Filled 2018-10-03: qty 0.6

## 2018-10-04 ENCOUNTER — Telehealth: Payer: Self-pay | Admitting: Medical Oncology

## 2018-10-04 ENCOUNTER — Ambulatory Visit
Admission: RE | Admit: 2018-10-04 | Discharge: 2018-10-04 | Disposition: A | Discharge status: Home / Self Care | Payer: Medicaid Other | Source: Ambulatory Visit | Attending: Radiation Oncology | Admitting: Radiation Oncology

## 2018-10-04 ENCOUNTER — Other Ambulatory Visit: Payer: Self-pay | Admitting: Medical Oncology

## 2018-10-04 DIAGNOSIS — K1379 Other lesions of oral mucosa: Secondary | ICD-10-CM

## 2018-10-04 DIAGNOSIS — Z510 Encounter for antineoplastic radiation therapy: Secondary | ICD-10-CM | POA: Diagnosis not present

## 2018-10-04 MED ORDER — MAGIC MOUTHWASH: 5.0000 mL | Freq: Four times a day (QID) | ORAL | 1 refills | Status: DC

## 2018-10-04 MED FILL — MAGIC MOUTHWASH BOP FORM: 12 days supply | Qty: 240 | Fill #0

## 2018-10-04 NOTE — Telephone Encounter (Signed)
Lips dry - he feels his mucositis is starting back and requests MMW.

## 2018-10-05 ENCOUNTER — Ambulatory Visit: Payer: Medicaid Other

## 2018-10-05 ENCOUNTER — Telehealth: Payer: Self-pay | Admitting: Internal Medicine

## 2018-10-05 NOTE — Telephone Encounter (Signed)
MM PAL 3/24 moved appointments to 3/23. Other appointments remain the same. Confirmed with patient.

## 2018-10-08 ENCOUNTER — Ambulatory Visit: Payer: Medicaid Other

## 2018-10-08 ENCOUNTER — Other Ambulatory Visit: Payer: Self-pay | Admitting: Medical Oncology

## 2018-10-08 ENCOUNTER — Ambulatory Visit
Admission: RE | Admit: 2018-10-08 | Discharge: 2018-10-08 | Disposition: A | Discharge status: Home / Self Care | Payer: Medicaid Other | Source: Ambulatory Visit | Attending: Radiation Oncology | Admitting: Radiation Oncology

## 2018-10-08 DIAGNOSIS — Z510 Encounter for antineoplastic radiation therapy: Secondary | ICD-10-CM | POA: Diagnosis not present

## 2018-10-08 DIAGNOSIS — C3491 Malignant neoplasm of unspecified part of right bronchus or lung: Secondary | ICD-10-CM

## 2018-10-09 ENCOUNTER — Inpatient Hospital Stay (HOSPITAL_BASED_OUTPATIENT_CLINIC_OR_DEPARTMENT_OTHER): Payer: Medicaid Other | Admitting: Medical

## 2018-10-09 ENCOUNTER — Inpatient Hospital Stay: Payer: Medicaid Other

## 2018-10-09 ENCOUNTER — Ambulatory Visit
Admission: RE | Admit: 2018-10-09 | Discharge: 2018-10-09 | Disposition: A | Discharge status: Home / Self Care | Payer: Medicaid Other | Source: Ambulatory Visit | Attending: Radiation Oncology | Admitting: Radiation Oncology

## 2018-10-09 ENCOUNTER — Inpatient Hospital Stay: Payer: Medicaid Other | Admitting: *Deleted

## 2018-10-09 ENCOUNTER — Telehealth: Payer: Self-pay | Admitting: *Deleted

## 2018-10-09 VITALS — BP 106/80 | HR 85 | Temp 98.1°F | Resp 18 | Ht 70.0 in | Wt 199.2 lb

## 2018-10-09 DIAGNOSIS — C3411 Malignant neoplasm of upper lobe, right bronchus or lung: Secondary | ICD-10-CM | POA: Diagnosis not present

## 2018-10-09 DIAGNOSIS — C787 Secondary malignant neoplasm of liver and intrahepatic bile duct: Secondary | ICD-10-CM | POA: Diagnosis not present

## 2018-10-09 DIAGNOSIS — C7951 Secondary malignant neoplasm of bone: Secondary | ICD-10-CM

## 2018-10-09 DIAGNOSIS — Z5112 Encounter for antineoplastic immunotherapy: Secondary | ICD-10-CM | POA: Diagnosis not present

## 2018-10-09 DIAGNOSIS — C3491 Malignant neoplasm of unspecified part of right bronchus or lung: Secondary | ICD-10-CM

## 2018-10-09 DIAGNOSIS — Z510 Encounter for antineoplastic radiation therapy: Secondary | ICD-10-CM | POA: Diagnosis not present

## 2018-10-09 DIAGNOSIS — K1230 Oral mucositis (ulcerative), unspecified: Secondary | ICD-10-CM

## 2018-10-09 LAB — CBC WITH DIFFERENTIAL (CANCER CENTER ONLY)
Abs Immature Granulocytes: 0.04 10*3/uL (ref 0.00–0.07)
Basophils Absolute: 0 10*3/uL (ref 0.0–0.1)
Basophils Relative: 0 %
Eosinophils Absolute: 0.1 10*3/uL (ref 0.0–0.5)
Eosinophils Relative: 2 %
HCT: 33.9 % — ABNORMAL LOW (ref 39.0–52.0)
Hemoglobin: 11.1 g/dL — ABNORMAL LOW (ref 13.0–17.0)
Immature Granulocytes: 1 %
Lymphocytes Relative: 18 %
Lymphs Abs: 0.5 10*3/uL — ABNORMAL LOW (ref 0.7–4.0)
MCH: 28.3 pg (ref 26.0–34.0)
MCHC: 32.7 g/dL (ref 30.0–36.0)
MCV: 86.5 fL (ref 80.0–100.0)
Monocytes Absolute: 0.6 10*3/uL (ref 0.1–1.0)
Monocytes Relative: 21 %
NEUTROS ABS: 1.7 10*3/uL (ref 1.7–7.7)
Neutrophils Relative %: 58 %
PLATELETS: 133 10*3/uL — AB (ref 150–400)
RBC: 3.92 MIL/uL — ABNORMAL LOW (ref 4.22–5.81)
RDW: 15 % (ref 11.5–15.5)
WBC Count: 3 10*3/uL — ABNORMAL LOW (ref 4.0–10.5)
nRBC: 1.3 % — ABNORMAL HIGH (ref 0.0–0.2)

## 2018-10-09 LAB — CMP (CANCER CENTER ONLY)
ALBUMIN: 2.7 g/dL — AB (ref 3.5–5.0)
ALT: 27 U/L (ref 0–44)
ANION GAP: 10 (ref 5–15)
AST: 21 U/L (ref 15–41)
Alkaline Phosphatase: 103 U/L (ref 38–126)
BUN: 14 mg/dL (ref 6–20)
CO2: 27 mmol/L (ref 22–32)
Calcium: 9.3 mg/dL (ref 8.9–10.3)
Chloride: 96 mmol/L — ABNORMAL LOW (ref 98–111)
Creatinine: 0.81 mg/dL (ref 0.61–1.24)
GFR, Est AFR Am: >60 mL/min (ref >60)
GFR, Estimated: >60 mL/min (ref >60)
GLUCOSE: 100 mg/dL — AB (ref 70–99)
POTASSIUM: 4.2 mmol/L (ref 3.5–5.1)
Sodium: 133 mmol/L — ABNORMAL LOW (ref 135–145)
Total Bilirubin: 0.9 mg/dL (ref 0.3–1.2)
Total Protein: 6.7 g/dL (ref 6.5–8.1)

## 2018-10-09 MED ORDER — ACYCLOVIR 200 MG/5ML PO SUSP: 400.0000 mg | Freq: Every day | ORAL | 0 refills | Status: DC

## 2018-10-09 MED ORDER — LIDOCAINE VISCOUS HCL 2 % MT SOLN: 5.0000 mL | OROMUCOSAL | 3 refills | Status: DC | PRN

## 2018-10-09 MED FILL — LIDOCAINE 2% VISCOUS SOLN: 2 | 10 days supply | Qty: 200 | Fill #0

## 2018-10-09 MED FILL — ACYCLOVIR 200 MG/5 ML SUSP: 200 | 7 days supply | Qty: 350 | Fill #0

## 2018-10-09 NOTE — Progress Notes (Signed)
Pt presents with reports of mucositis that has not improved since last visit with provider.  Sores visible on inside of lips/sides of tongue.  Reports having difficulty eating d/t the pain.  Reports using magic mouthwash without relief.

## 2018-10-09 NOTE — Patient Instructions (Signed)
Oral Mucositis  Oral mucositis is a mouth condition that may develop as a result of treatments for cancer. Sores may appear on your lips, gums, tongue, throat, and the top (roof) or bottom (floor) of your mouth.  What are the causes?  This condition can happen to anyone who is being treated with cancer therapies, including:  · Cancer medicines (chemotherapy).  · Radiation therapy.  · Bone marrow transplants and stem cell transplants.  Cancer treatments can damage the lining of the mouth, which causes this condition. Oral mucositis is not caused by infection. However, the sores can become infected after they form. Infection can make oral mucositis worse.  What increases the risk?  The following factors may make you more likely to develop this condition:  · Having poor oral hygiene.  · Having dental problems or oral diseases.  · Using products that contain nicotine or tobacco, such as cigarettes, chewing tobacco, and e-cigarettes.  · Drinking alcohol.  · Having other medical conditions, such as diabetes, HIV, AIDS, or kidney disease.  · Not drinking enough clear fluids.  · Wearing dentures that do not fit correctly.  · Having cancers that primarily affect the blood.  · Having cancers of the head and neck.  · Receiving radiation therapy to the head and neck region.  What are the signs or symptoms?  Symptoms of this condition can vary from mild to severe. Symptoms are usually seen 7-10 days after cancer treatment has started. They include:  · Mouth sores. These sores may bleed.  · Color changes inside the mouth. Red, shiny areas may appear.  · White patches or pus in the mouth.  · Pain in the mouth and throat. This can make it painful to speak and swallow.  · Dryness and a burning feeling in the mouth.  · Saliva that is dry and thick.  · Trouble eating, drinking, and swallowing. This can lead to weight loss.  How is this diagnosed?  This condition can be diagnosed with a physical exam.  In some cases, lab tests or  cultures may be done to check for an associated infection.  How is this treated?  Treatment depends on the severity of the condition. Oral mucositis often heals on its own. Sometimes, changes in the cancer treatment can help. Treatment may include medicines, such as:  · An antibiotic medicine to fight infection, if present.  · Medicine to help the cells in your mouth heal more quickly.  Medicine may also be given to help control pain. This may include:  · Pain relievers that are swished around in the mouth. These make the mouth numb to ease the pain (topical anesthetics).  · Mouth rinses.  · Prescribed, medicated gels. The gel coats the mouth. This protects nerve endings and lessens the pain.  · Pain medicines.  Follow these instructions at home:  Medicines  · Take or apply over-the-counter and prescription medicines only as told by your health care provider.  · If you were prescribed an antibiotic medicine, take or apply it as told by your health care provider. Do not stop using the antibiotic even if you start to feel better.  · Do not use products that contain benzocaine (including numbing gels) to treat mouth pain in children who are younger than 2 years. These products may cause a rare but serious blood condition.  Eating and drinking    · Talk to a diet and nutrition specialist (dietitian) about what you should eat and drink if you   have mucositis.  · Drink high-nutrition and high-calorie shakes or supplements.  · Eat bland and soft foods that are easy to eat.  · Drink enough fluid to keep your urine pale yellow.  · Do not eat foods that are hot, spicy, citrus, or hard to swallow.  · Do not drink alcohol.  Lifestyle         · Keep your mouth clean and germ-free. To maintain good oral hygiene:  ? Brush your teeth carefully with a soft toothbrush at least two times each day. Use a gentle toothpaste. Ask your health care provider to recommend the right toothpaste for you.  ? Use a soft sponge (oral swab) to clean  your mouth and teeth instead of a toothbrush if mouth sores are severe.  ? Floss your teeth every day.  ? Have your teeth cleaned regularly as recommended by your dentist.  ? Rinse your mouth after every meal or as directed by your health care provider. Do not use mouthwash that contains alcohol. Ask your health care provider for a mouthwash or mouth rinse recommendation.  · Do not use any products that contain nicotine or tobacco, such as cigarettes and e-cigarettes. If you need help quitting, ask your health care provider.  General instructions  · Follow instructions from your health care provider about:  ? Cleaning mouth sores.  ? Taking out your dentures.  ? Changing your diet or finding other ways to get nutrients. This is important if you are losing weight.  · If your lips are dry or cracked, apply a water-based moisturizer to your lips as needed.  · Try sucking on ice chips or sugar-free frozen pops. This may help with pain. This also keeps your mouth moist.  · Keep all follow-up visits as told by your health care provider. This is important.  Contact a health care provider if:  · You have mouth pain or throat pain.  · You are having more trouble swallowing.  · Your symptoms get worse.  · You have new symptoms.  · Your pain is not controlled with medicine.  · You have trouble speaking.  Get help right away if you:  · Have a fever.  · Cannot swallow solid food or liquids.  · Have a lot of bleeding in your mouth.  · Develop new, open, or draining sores in your mouth.  Summary  · Oral mucositis is a mouth condition that may develop as a result of treatments for cancer. Sores may appear on your lips, gums, tongue, throat, and the top (roof) or bottom (floor) of your mouth.  · Cancer treatments can damage the lining of the mouth, which causes this condition.  · Treatment depends on how severe the condition is. It may include medicine to fight infection, medicine to ease pain, or medicine to help the cells in your  mouth heal more quickly.  This information is not intended to replace advice given to you by your health care provider. Make sure you discuss any questions you have with your health care provider.  Document Released: 03/04/2011 Document Revised: 08/03/2017 Document Reviewed: 08/03/2017  Elsevier Interactive Patient Education © 2019 [employer] Inc[employer]

## 2018-10-09 NOTE — Telephone Encounter (Signed)
Pt called states " I still have mouth sores, 2 ulcers on my tongue. I've been using magic mouthwash, it's getting difficult to swallow." Pt has had to Rx for Diflucan x 2. Pt denied fever chills, night sweats, pain in throat. Reviewed with MD, who recommended pt be seen in [facility]. Message to scheduling. Pt notified to be here at 3pm for [facility] appt.

## 2018-10-10 ENCOUNTER — Telehealth: Payer: Self-pay | Admitting: Emergency Medicine

## 2018-10-10 ENCOUNTER — Encounter: Payer: Self-pay | Admitting: Radiation Oncology

## 2018-10-10 MED ORDER — DOXEPIN HCL 10 MG/ML PO CONC: ORAL | 12 refills | Status: DC

## 2018-10-10 MED ORDER — OXYCODONE-ACETAMINOPHEN 5-325 MG PO TABS: 1.0000 | ORAL_TABLET | Freq: Four times a day (QID) | ORAL | 0 refills | Status: DC | PRN

## 2018-10-10 MED FILL — OXYCODONE-ACETAMINOPHEN 5-3: 5-325 | 7 days supply | Qty: 30 | Fill #0

## 2018-10-10 MED FILL — DOXEPIN 10 MG/ML ORAL CONC: 10 | 24 days supply | Qty: 120 | Fill #0

## 2018-10-10 NOTE — Progress Notes (Signed)
  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     At the beginning of treatment, pt reported some pain in his right hip, moderate fatigue, and some constipation. Pt denied N/V throughout treatments. Towards the end of treatment, pt reported significant improvement in his lower back pain. Overall the pt was without complaints.   Plan: The patient has completed radiation treatment. The patient will return to radiation oncology clinic for routine followup in one month. I advised them to call or return sooner if they have any questions or concerns related to their recovery or treatment.  -----------------------------------  Blair Promise, PhD, MD  This document serves as a record of services personally performed by Gery Pray, MD. It was created on his behalf by Mary-Margaret Loma Messing, a trained medical scribe. The creation of this record is based on the scribe's personal observations and the provider's statements to them. This document has been checked and approved by the attending provider.

## 2018-10-10 NOTE — Telephone Encounter (Signed)
Pt called requesting follow up phone call from [place] about prescribed medications from 10/09/2018 [facility] visit.  PA Lucianne Lei aware, states he will call pt back.

## 2018-10-11 NOTE — Progress Notes (Signed)
Symptoms Management Clinic Progress Note   Anthony [name] [id number] 12-23-1970 48 y.o.  Anthony [name] is managed by is managed by Dr. Eilleen Kempf  Actively treated with chemotherapy/immunotherapy/hormonal therapy: yes  Current therapy: Taxotere and Cyramza  Last treated:  10/02/2018 (Cycle 2 Day 1)  Next scheduled appointment with provider: 10/22/2018  Assessment: Plan:    Mucositis - Plan: lidocaine (XYLOCAINE) 2 % solution, acyclovir (ZOVIRAX) 200 MG/5ML suspension, doxepin (SINEQUAN) 10 MG/ML solution, oxyCODONE-acetaminophen (PERCOCET/ROXICET) 5-325 MG tablet  Stage 4 lung cancer, right ([place])  Liver metastases ([place])  Bone metastasis ([place])   Mucositis: The patient was given a refill of viscous lidocaine and Percocet.  Additionally he was given a prescription for acyclovir after a literature search was given a prescription for a doxepin oral rinse (Journal of Clinical Oncology, Dec 18, 2012).  Metastatic non-small cell lung cancer, adenocarcinoma with liver and bone metastasis: The patient is status post cycle 2, day 1 of Taxotere and Cyramza with Neulasta support.  His last cycle was dosed on 10/02/2018.  He is scheduled to return for follow-up on 10/22/2018.  Please see After Visit Summary for patient specific instructions.  Future Appointments  Date Time Provider [place]  10/16/2018  1:30 PM [facility] LAB 6 [facility] None  10/22/2018  8:45 AM [facility] ONLY [facility] None  10/22/2018  9:15 AM Heilingoetter, Cassandra L, PA-C [facility] None  10/22/2018 10:00 AM [facility] INFUSION [facility] None  10/22/2018 10:30 AM Ernestene Kiel L, RD [facility] None  10/24/2018 11:15 AM [facility] [place] FLUSH [facility] None  10/24/2018 11:30 AM Kennith Center, LCSW [facility] None  10/30/2018  1:30 PM [facility] LAB 6 [facility] None  11/06/2018  1:30 PM [facility] LAB 6 [facility] None  11/13/2018 10:30 AM [facility] LAB 6 [facility] None  11/13/2018 11:00  AM [name], MD [facility] None  11/13/2018 12:00 PM [facility] INFUSION [facility] None  11/15/2018 11:30 AM [facility] [place] FLUSH [facility] None  11/19/2018  3:00 PM Gery Pray, MD [facility] None  11/20/2018  1:30 PM [facility] LAB 1 [facility] None  11/27/2018  1:30 PM [facility] LAB 1 [facility] None  12/04/2018 10:30 AM [facility] LAB 2 [facility] None  12/04/2018 11:00 AM [name], MD [facility] None  12/04/2018 12:00 PM [facility] INFUSION [facility] None  12/06/2018 12:00 PM [facility] [place] FLUSH [facility] None    No orders of the defined types were placed in this encounter.      Subjective:   Patient ID:  Anthony [name] is a 48 y.o. (DOB 1971/06/30) male.  Chief Complaint:  Chief Complaint  Patient presents with  . Mucositis    HPI Anthony [name] is a 48 year old male with a history of a metastatic non-small cell lung cancer, adenocarcinoma who is managed by Dr. [name] and is status post cycle 2, day 1 of Taxotere and Cyramza which was dosed on 10/02/2018.  His treatments have been given with Neulasta support.  He presents today with continued mucositis.  This is causing him significant pain.  He is eating and drinking minimally due to this pain.  He has been using Magic mouthwash without good relief.  He requests a refill of Magic mouthwash today.  He is out of viscous lidocaine.  He requests a refill of Percocet today.  He denies fevers, chills, or sweats.  Medications: I have reviewed the patient's current medications.  Allergies: No Known Allergies  Past Medical History:  Diagnosis Date  . Acid reflux   . History of radiation therapy 09/21/17-10/04/17  C4 spine 30 Gy in 10 fractions, left femur 30 Gy in 10 fractions  . Non-small cell lung cancer ([place])   . NSTEMI (non-ST elevated myocardial infarction) ([place]) 06/13/2017   [name] 06/14/2017  . Tailbone injury since 1993   "cracked"    Past Surgical History:  Procedure Laterality Date  . CARDIAC  CATHETERIZATION  06/14/2017  . CHOLECYSTECTOMY N/A 12/29/2017   Procedure: LAPAROSCOPIC CHOLECYSTECTOMY;  Surgeon: Ralene Ok, MD;  Location: [facility];  Service: General;  Laterality: N/A;  . CHOLECYSTECTOMY, LAPAROSCOPIC N/A 12/29/2017  . CORONARY ARTERY BYPASS GRAFT N/A 06/19/2017   Procedure: CORONARY ARTERY BYPASS GRAFTING (CABG), OFF PUMP, times one using the left internal mammary artery to LAD;  Surgeon: Grace Isaac, MD;  Location: [place];  Service: Open Heart Surgery;  Laterality: N/A;  . ESOPHAGOGASTRODUODENOSCOPY (EGD) WITH PROPOFOL N/A 11/30/2017   Procedure: ESOPHAGOGASTRODUODENOSCOPY (EGD) WITH PROPOFOL;  Surgeon: Gatha Mayer, MD;  Location: [facility];  Service: Endoscopy;  Laterality: N/A;  . LEFT HEART CATH AND CORONARY ANGIOGRAPHY N/A 06/14/2017   Procedure: LEFT HEART CATH AND CORONARY ANGIOGRAPHY;  Surgeon: Sherren Mocha, MD;  Location: [place] [facility];  Service: Cardiovascular;  Laterality: N/A;  . TEE WITHOUT CARDIOVERSION N/A 06/19/2017   Procedure: TRANSESOPHAGEAL ECHOCARDIOGRAM (TEE);  Surgeon: Grace Isaac, MD;  Location: [facility];  Service: Open Heart Surgery;  Laterality: N/A;  . TONSILLECTOMY  ~ 1977    Family History  Problem Relation Age of Onset  . Alcohol abuse Mother   . Heart disease Father   . Arrhythmia Father   . Aneurysm Maternal Grandmother     Social History   Socioeconomic History  . Marital status: Divorced    Spouse name: Not on file  . Number of children: Not on file  . Years of education: Not on file  . Highest education level: Not on file  Occupational History  . Occupation: Scientist, clinical (histocompatibility and immunogenetics): [employer]  . Financial resource strain: Not on file  . Food insecurity:    Worry: Not on file    Inability: Not on file  . Transportation needs:    Medical: Not on file    Non-medical: Not on file  Tobacco Use  . Smoking status: Never Smoker  . Smokeless tobacco: Former Systems developer    Types: Snuff  Substance  and Sexual Activity  . Alcohol use: Yes    Comment: 06/16/2017 "used to drink alot; pretty much stopped~ 01/2015; will have a couple drinks/month now"  . Drug use: No    Comment: 06/14/2017 "nothing since 2000"  . Sexual activity: Yes  Lifestyle  . Physical activity:    Days per week: Not on file    Minutes per session: Not on file  . Stress: Not on file  Relationships  . Social connections:    Talks on phone: Not on file    Gets together: Not on file    Attends religious service: Not on file    Active member of club or organization: Not on file    Attends meetings of clubs or organizations: Not on file    Relationship status: Not on file  . Intimate partner violence:    Fear of current or ex partner: Not on file    Emotionally abused: Not on file    Physically abused: Not on file    Forced sexual activity: Not on file  Other Topics Concern  . Not on file  Social History Narrative  Divorced   Education administrator   + EtOH   Never smoker    Past Medical History, Surgical history, Social history, and Family history were reviewed and updated as appropriate.   Please see review of systems for further details on the patient's review from today.   Review of Systems:  Review of Systems  Constitutional: Positive for appetite change. Negative for chills, diaphoresis and fever.  HENT: Positive for mouth sores. Negative for sore throat and trouble swallowing.   Respiratory: Negative for cough, shortness of breath and wheezing.   Cardiovascular: Negative for chest pain and palpitations.  Gastrointestinal: Negative for constipation, diarrhea, nausea and vomiting.    Objective:   Physical Exam:  BP 106/80 (BP Location: Left Arm, Patient Position: Sitting)   Pulse 85   Temp 98.1 F (36.7 C) (Oral)   Resp 18   Ht 5\' 10"  (1.778 m)   Wt 199 lb 3.2 oz (90.4 kg)   SpO2 99%   BMI 28.58 kg/m  ECOG: 1  Physical Exam Constitutional:      General: He is not in acute distress.     Appearance: He is not diaphoretic.  HENT:     Head: Normocephalic.     Mouth/Throat:     Comments: The patient's tongue and buccal membranes show extensive shedding of skin consistent with mucositis.  Mild erythema is also noted. Eyes:     General: No scleral icterus.       Right eye: No discharge.        Left eye: No discharge.  Neck:     Musculoskeletal: Normal range of motion and neck supple.  Cardiovascular:     Rate and Rhythm: Normal rate and regular rhythm.     Heart sounds: Normal heart sounds. No murmur. No friction rub. No gallop.   Pulmonary:     Effort: Pulmonary effort is normal. No respiratory distress.     Breath sounds: Normal breath sounds. No stridor. No wheezing or rales.  Chest:     Chest wall: No tenderness.  Lymphadenopathy:     Cervical: No cervical adenopathy.  Skin:    General: Skin is warm and dry.  Neurological:     Mental Status: He is alert.     Coordination: Coordination normal.     Lab Review:     Component Value Date/Time   NA 133 (L) 10/09/2018 1329   NA 141 07/17/2017 1006   K 4.2 10/09/2018 1329   CL 96 (L) 10/09/2018 1329   CO2 27 10/09/2018 1329   GLUCOSE 100 (H) 10/09/2018 1329   BUN 14 10/09/2018 1329   BUN 12 07/17/2017 1006   CREATININE 0.81 10/09/2018 1329   CALCIUM 9.3 10/09/2018 1329   PROT 6.7 10/09/2018 1329   PROT 6.2 02/02/2018 0825   ALBUMIN 2.7 (L) 10/09/2018 1329   ALBUMIN 4.1 02/02/2018 0825   AST 21 10/09/2018 1329   ALT 27 10/09/2018 1329   ALKPHOS 103 10/09/2018 1329   BILITOT 0.9 10/09/2018 1329   GFRNONAA >60 10/09/2018 1329   GFRAA >60 10/09/2018 1329       Component Value Date/Time   WBC 3.0 (L) 10/09/2018 1329   WBC 4.1 12/01/2017 0937   RBC 3.92 (L) 10/09/2018 1329   HGB 11.1 (L) 10/09/2018 1329   HGB 14.7 07/17/2017 1006   HCT 33.9 (L) 10/09/2018 1329   HCT 42.6 07/17/2017 1006   PLT 133 (L) 10/09/2018 1329   PLT 334 07/17/2017 1006   MCV 86.5 10/09/2018 1329  MCV 89 07/17/2017 1006   MCH  28.3 10/09/2018 1329   MCHC 32.7 10/09/2018 1329   RDW 15.0 10/09/2018 1329   RDW 13.6 07/17/2017 1006   LYMPHSABS 0.5 (L) 10/09/2018 1329   LYMPHSABS 2.1 07/17/2017 1006   MONOABS 0.6 10/09/2018 1329   EOSABS 0.1 10/09/2018 1329   EOSABS 0.2 07/17/2017 1006   BASOSABS 0.0 10/09/2018 1329   BASOSABS 0.1 07/17/2017 1006   -------------------------------  Imaging from last 24 hours (if applicable):  Radiology interpretation: Dg Chest 1 View  Result Date: 09/14/2018 CLINICAL DATA:  Status post thoracentesis. History of lung carcinoma EXAM: CHEST  1 VIEW COMPARISON:  Chest CT September 03, 2018 FINDINGS: No pneumothorax. There is a moderate loculated pleural effusion on the right. There is atelectatic change in the right mid and lower lung zones. Left lung clear. Heart size and pulmonary vascularity are normal. Patient is status post median sternotomy. No adenopathy. No bone lesions. IMPRESSION: No pneumothorax. Moderate pleural effusion on the right which appears loculated. Atelectatic change on the right noted. Left lung clear. Heart size normal. Electronically Signed   By: Lowella Grip III M.D.   On: 09/14/2018 11:48   US Thoracentesis Asp Pleural Space W/img Guide  Result Date: 09/14/2018 INDICATION: Patient with history of stage IV right lung cancer, dyspnea, right pleural effusion. Request made for diagnostic and therapeutic right thoracentesis. EXAM: ULTRASOUND GUIDED DIAGNOSTIC AND THERAPEUTIC RIGHT THORACENTESIS MEDICATIONS: None COMPLICATIONS: None immediate. PROCEDURE: An ultrasound guided thoracentesis was thoroughly discussed with the patient and questions answered. The benefits, risks, alternatives and complications were also discussed. The patient understands and wishes to proceed with the procedure. Written consent was obtained. Ultrasound was performed to localize and mark an adequate pocket of fluid in the right chest. The area was then prepped and draped in the normal sterile  fashion. 1% Lidocaine was used for local anesthesia. Under ultrasound guidance a 6 Fr Safe-T-Centesis catheter was introduced. Thoracentesis was performed. The catheter was removed and a dressing applied. FINDINGS: A total of approximately 350 cc of dark, bloody fluid was removed. Samples were sent to the laboratory as requested by the clinical team. The pleural fluid collection was multiloculated and only the above amount of fluid could be aspirated today. IMPRESSION: Successful ultrasound guided diagnostic and therapeutic right thoracentesis yielding 350 cc of pleural fluid. Read by: Rowe Robert, PA-C Electronically Signed   By: Aletta Edouard M.D.   On: 09/14/2018 12:54

## 2018-10-16 ENCOUNTER — Ambulatory Visit: Payer: Medicaid Other

## 2018-10-16 ENCOUNTER — Inpatient Hospital Stay: Payer: Medicaid Other

## 2018-10-16 ENCOUNTER — Ambulatory Visit: Payer: Medicaid Other | Admitting: Internal Medicine

## 2018-10-16 ENCOUNTER — Other Ambulatory Visit: Payer: Self-pay

## 2018-10-16 ENCOUNTER — Telehealth: Payer: Self-pay

## 2018-10-16 ENCOUNTER — Other Ambulatory Visit: Payer: Medicaid Other

## 2018-10-16 DIAGNOSIS — Z5112 Encounter for antineoplastic immunotherapy: Secondary | ICD-10-CM | POA: Diagnosis not present

## 2018-10-16 DIAGNOSIS — C3491 Malignant neoplasm of unspecified part of right bronchus or lung: Secondary | ICD-10-CM

## 2018-10-16 LAB — TOTAL PROTEIN, URINE DIPSTICK

## 2018-10-16 NOTE — Telephone Encounter (Signed)
Phone call placed to patient to introduce palliative care and to offer to schedule visit. Patient stated he would call back to schedule after he checks his calendar for Thursday 10/18/2018

## 2018-10-17 ENCOUNTER — Telehealth: Payer: Self-pay

## 2018-10-17 NOTE — Telephone Encounter (Signed)
Phone call returned to patient to schedule visit with Palliative Care. Visit scheduled for 10/18/2018

## 2018-10-18 ENCOUNTER — Other Ambulatory Visit: Payer: Medicaid Other | Admitting: Internal Medicine

## 2018-10-18 ENCOUNTER — Other Ambulatory Visit: Payer: Self-pay

## 2018-10-18 DIAGNOSIS — Z515 Encounter for palliative care: Secondary | ICD-10-CM

## 2018-10-19 ENCOUNTER — Other Ambulatory Visit: Payer: Self-pay | Admitting: *Deleted

## 2018-10-19 DIAGNOSIS — C3491 Malignant neoplasm of unspecified part of right bronchus or lung: Secondary | ICD-10-CM

## 2018-10-22 ENCOUNTER — Encounter: Payer: Self-pay | Admitting: Physician Assistant

## 2018-10-22 ENCOUNTER — Telehealth: Payer: Self-pay | Admitting: Nutrition

## 2018-10-22 ENCOUNTER — Inpatient Hospital Stay (HOSPITAL_BASED_OUTPATIENT_CLINIC_OR_DEPARTMENT_OTHER): Payer: Medicaid Other | Admitting: Physician Assistant

## 2018-10-22 ENCOUNTER — Inpatient Hospital Stay: Payer: Medicaid Other

## 2018-10-22 ENCOUNTER — Other Ambulatory Visit: Payer: Self-pay

## 2018-10-22 ENCOUNTER — Inpatient Hospital Stay: Payer: Medicaid Other | Admitting: Nutrition

## 2018-10-22 VITALS — HR 72

## 2018-10-22 VITALS — BP 132/84 | HR 100 | Temp 98.0°F | Resp 18 | Ht 70.0 in | Wt 194.8 lb

## 2018-10-22 DIAGNOSIS — Z5112 Encounter for antineoplastic immunotherapy: Secondary | ICD-10-CM

## 2018-10-22 DIAGNOSIS — K1230 Oral mucositis (ulcerative), unspecified: Secondary | ICD-10-CM | POA: Diagnosis not present

## 2018-10-22 DIAGNOSIS — R5383 Other fatigue: Secondary | ICD-10-CM

## 2018-10-22 DIAGNOSIS — C3411 Malignant neoplasm of upper lobe, right bronchus or lung: Secondary | ICD-10-CM | POA: Diagnosis not present

## 2018-10-22 DIAGNOSIS — K219 Gastro-esophageal reflux disease without esophagitis: Secondary | ICD-10-CM

## 2018-10-22 DIAGNOSIS — Z5111 Encounter for antineoplastic chemotherapy: Secondary | ICD-10-CM

## 2018-10-22 DIAGNOSIS — Z7982 Long term (current) use of aspirin: Secondary | ICD-10-CM

## 2018-10-22 DIAGNOSIS — M25551 Pain in right hip: Secondary | ICD-10-CM

## 2018-10-22 DIAGNOSIS — C3491 Malignant neoplasm of unspecified part of right bronchus or lung: Secondary | ICD-10-CM

## 2018-10-22 DIAGNOSIS — C787 Secondary malignant neoplasm of liver and intrahepatic bile duct: Secondary | ICD-10-CM

## 2018-10-22 DIAGNOSIS — Z923 Personal history of irradiation: Secondary | ICD-10-CM

## 2018-10-22 DIAGNOSIS — Z79899 Other long term (current) drug therapy: Secondary | ICD-10-CM

## 2018-10-22 DIAGNOSIS — I252 Old myocardial infarction: Secondary | ICD-10-CM

## 2018-10-22 DIAGNOSIS — R634 Abnormal weight loss: Secondary | ICD-10-CM

## 2018-10-22 DIAGNOSIS — C7951 Secondary malignant neoplasm of bone: Secondary | ICD-10-CM

## 2018-10-22 LAB — TOTAL PROTEIN, URINE DIPSTICK: Protein, ur: NEGATIVE mg/dL

## 2018-10-22 LAB — CBC WITH DIFFERENTIAL (CANCER CENTER ONLY)
Abs Immature Granulocytes: 0.06 10*3/uL (ref 0.00–0.07)
BASOS ABS: 0 10*3/uL (ref 0.0–0.1)
Basophils Relative: 0 %
EOS PCT: 0 %
Eosinophils Absolute: 0 10*3/uL (ref 0.0–0.5)
HCT: 34.4 % — ABNORMAL LOW (ref 39.0–52.0)
HEMOGLOBIN: 11.1 g/dL — AB (ref 13.0–17.0)
Immature Granulocytes: 1 %
LYMPHS PCT: 10 %
Lymphs Abs: 1.1 10*3/uL (ref 0.7–4.0)
MCH: 29.3 pg (ref 26.0–34.0)
MCHC: 32.3 g/dL (ref 30.0–36.0)
MCV: 90.8 fL (ref 80.0–100.0)
Monocytes Absolute: 0.5 10*3/uL (ref 0.1–1.0)
Monocytes Relative: 4 %
Neutro Abs: 9.1 10*3/uL — ABNORMAL HIGH (ref 1.7–7.7)
Neutrophils Relative %: 85 %
Platelet Count: 244 10*3/uL (ref 150–400)
RBC: 3.79 MIL/uL — ABNORMAL LOW (ref 4.22–5.81)
RDW: 18.1 % — ABNORMAL HIGH (ref 11.5–15.5)
WBC Count: 10.7 10*3/uL — ABNORMAL HIGH (ref 4.0–10.5)
nRBC: 0 % (ref 0.0–0.2)

## 2018-10-22 LAB — CMP (CANCER CENTER ONLY)
ALT: 22 U/L (ref 0–44)
AST: 18 U/L (ref 15–41)
Albumin: 3 g/dL — ABNORMAL LOW (ref 3.5–5.0)
Alkaline Phosphatase: 115 U/L (ref 38–126)
Anion gap: 11 (ref 5–15)
BUN: 15 mg/dL (ref 6–20)
CO2: 22 mmol/L (ref 22–32)
Calcium: 10 mg/dL (ref 8.9–10.3)
Chloride: 106 mmol/L (ref 98–111)
Creatinine: 0.82 mg/dL (ref 0.61–1.24)
GFR, Est AFR Am: >60 mL/min (ref >60)
GFR, Estimated: >60 mL/min (ref >60)
Glucose, Bld: 123 mg/dL — ABNORMAL HIGH (ref 70–99)
Potassium: 4.3 mmol/L (ref 3.5–5.1)
Sodium: 139 mmol/L (ref 135–145)
Total Bilirubin: 0.4 mg/dL (ref 0.3–1.2)
Total Protein: 7.2 g/dL (ref 6.5–8.1)

## 2018-10-22 MED ORDER — SODIUM CHLORIDE 0.9 % IV SOLN: 20.0000 mg | Freq: Once | INTRAVENOUS | Status: DC

## 2018-10-22 MED ORDER — DIPHENHYDRAMINE HCL 50 MG/ML IJ SOLN
INTRAMUSCULAR | Status: AC
  Filled 2018-10-22: qty 1

## 2018-10-22 MED ORDER — SODIUM CHLORIDE 0.9 % IV SOLN
20.0000 mg | Freq: Once | INTRAVENOUS | Status: AC
  Administered 2018-10-22: 20 mg via INTRAVENOUS
  Filled 2018-10-22: qty 2

## 2018-10-22 MED ORDER — SODIUM CHLORIDE 0.9 % IV SOLN
75.0000 mg/m2 | Freq: Once | INTRAVENOUS | Status: AC
  Administered 2018-10-22: 160 mg via INTRAVENOUS
  Filled 2018-10-22: qty 16

## 2018-10-22 MED ORDER — DIPHENHYDRAMINE HCL 50 MG/ML IJ SOLN
50.0000 mg | Freq: Once | INTRAMUSCULAR | Status: AC
  Administered 2018-10-22: 50 mg via INTRAVENOUS

## 2018-10-22 MED ORDER — SODIUM CHLORIDE 0.9 % IV SOLN
Freq: Once | INTRAVENOUS | Status: AC
  Administered 2018-10-22: 11:00:00 via INTRAVENOUS
  Filled 2018-10-22: qty 250

## 2018-10-22 MED ORDER — DEXAMETHASONE SODIUM PHOSPHATE 10 MG/ML IJ SOLN
10.0000 mg | Freq: Once | INTRAMUSCULAR | Status: AC
  Administered 2018-10-22: 10 mg via INTRAVENOUS

## 2018-10-22 MED ORDER — ACETAMINOPHEN 325 MG PO TABS
ORAL_TABLET | ORAL | Status: AC
  Filled 2018-10-22: qty 2

## 2018-10-22 MED ORDER — SODIUM CHLORIDE 0.9 % IV SOLN
9.0000 mg/kg | Freq: Once | INTRAVENOUS | Status: AC
  Administered 2018-10-22: 900 mg via INTRAVENOUS
  Filled 2018-10-22: qty 50

## 2018-10-22 MED ORDER — ACETAMINOPHEN 325 MG PO TABS
650.0000 mg | ORAL_TABLET | Freq: Once | ORAL | Status: AC
  Administered 2018-10-22: 650 mg via ORAL

## 2018-10-22 MED ORDER — DEXAMETHASONE SODIUM PHOSPHATE 10 MG/ML IJ SOLN
INTRAMUSCULAR | Status: AC
  Filled 2018-10-22: qty 1

## 2018-10-22 NOTE — Telephone Encounter (Signed)
RD working remotely.  48 year old male documented with metastatic non-small cell lung cancer.  Past medical history includes acid reflux and NSTEMI.  Medications include Lipitor, Decadron, Nexium, Magic mouthwash, and Compazine.  Labs include glucose 123 and albumin 3.0 on March 23.  Height: 5 feet 10 inches. Weight: 194.8 pounds. Usual body weight: 219.6 pounds February 13. BMI: 27.95.  Patient reports that he had bad mouth sores for approximately 3 weeks and he has been unable to eat normally. Reports he makes himself eat secondary to the yeast infection. He has been able to tolerate foods with a bit more texture and tolerated an egg McMuffin this morning. He has a request for information about higher fiber foods secondary to constipation.  Nutrition diagnosis: Unintended weight loss related to metastatic lung cancer and associated treatments as evidenced by 11% weight loss in 6 weeks.  Intervention: Educated patient to consume smaller, more frequent meals and snacks. Reviewed high-calorie, high-protein foods. Educated patient on ways to make foods easier to swallow. Recommended patient continue oral nutrition supplements such as protein shakes and boost. I will mail fact sheets to patient to include increasing calories and protein, high-protein foods, and constipation. Questions were answered.  Teach back method used.  Next visit: To be scheduled as needed.  I have encouraged patient to call me and leave a voicemail with any questions he may have in the future.  **Disclaimer: This note was dictated with voice recognition software. Similar sounding words can inadvertently be transcribed and this note may contain transcription errors which may not have been corrected upon publication of note.**

## 2018-10-22 NOTE — Progress Notes (Signed)
[facility] OFFICE PROGRESS NOTE  [name], MD [address] [place] [address]  DIAGNOSIS: Stage IVB(T1b, N2, M1c)non-small cell lung cancer, adenocarcinoma presented with right upper lobe lung nodule in addition to right hilar and mediastinal lymphadenopathy as well as metastatic liver and bone disease diagnosed in February 2019.  Biomarker Findings Microsatellite status - MS-Stable Tumor Mutational Burden - TMB-Low (4 Muts/Mb) Genomic Findings For a complete list of the genes assayed, please refer to the Appendix. EGFR exon 20 insertion ([id number]) RB1 loss exons 9-17 TP53 [id number] 7 Disease relevant genes with no reportable alterations: KRAS, ALK, BRAF, MET, RET, ERBB2, ROS1  PDL 1 expression 5%  PRIOR THERAPY:  1) Palliative radiotherapy to the metastatic bone lesions in the cervical spine and left femur under the care of Dr. Sondra Come. 2) Systemic chemotherapy with carboplatin for AUC of 5, Alimta 500 mg/M2 and Keytruda 200 mg IV every 3 weeks.  First dose October 04, 2017.  Status post 3 cycles. 3) palliative stereotactic radiotherapy to the progressive liver lesion. 4)  Maintenance treatment with Alimta 500 mg/M2 and Keytruda 200 mg IV every 3 weeks.  Status post 12 cycles.  Last dose was giving August 14, 2018 discontinued secondary to disease progression.  CURRENT THERAPY: Systemic chemotherapy with docetaxel 75 mg/M2 and Cyramza 10 mg/KG every 3 weeks with Neulasta support.  First dose September 11, 2018.  Status posts 2 cycles.  INTERVAL HISTORY: Tailor Westfall Luczynski 48 y.o. male returns to the clinic for a follow-up visit.  The patient is feeling well today without any concerning complaints.  He was recently seen in the symptom management clinic for mucositis secondary to his treatment.  He was given viscious lidocaine, magic wash, diflucan, percocet, acyclovir, and a prescription for doxepin oral rinse. His mucositis has improved with  these interventions. He had lost ~13lbs over a duration of 3 weeks secondary to oral pain with eating. However, he has recently been able to tolerate food with the resolution of his mucositis. He is beginning to gain weight back. He has been consuming protein shakes, boost, ensure, and meal replacements. Otherwise he denies any fever, chills, or night sweats. He denies any chest pain, cough, or hemoptysis. He endorses some mild shortness of breath and characterizes it as "having a hard time getting his breath" a few minutes after waking in the morning but resolves "once he is up". He denies any nausea, vomiting, diarrhea, or constipation. He denies any headache or visual changes.  He continues to experience some pain in the right hip/back from his metastatic disease; however, it has improved since finishing up radiation treatment under the care of Dr. Sondra Come. He is here for evaluation prior to starting cycle #3 today.  MEDICAL HISTORY: Past Medical History:  Diagnosis Date  . Acid reflux   . History of radiation therapy 09/21/17-10/04/17   C4 spine 30 Gy in 10 fractions, left femur 30 Gy in 10 fractions  . Non-small cell lung cancer ([facility])   . NSTEMI (non-ST elevated myocardial infarction) ([facility]) 06/13/2017   [name] 06/14/2017  . Tailbone injury since 1993   "cracked"    ALLERGIES:  has No Known Allergies.  MEDICATIONS:  Current Outpatient Medications  Medication Sig Dispense Refill  . acetaminophen (TYLENOL) 500 MG tablet Take 500-1,000 mg by mouth daily as needed for moderate pain or headache.    Marland Kitchen aspirin EC 81 MG EC tablet Take 1 tablet (81 mg total) by mouth daily.    Marland Kitchen  atorvastatin (LIPITOR) 40 MG tablet Take 1 tablet (40 mg total) by mouth daily. 90 tablet 3  . cetirizine (ZYRTEC) 10 MG tablet Take 10 mg by mouth daily as needed for allergies.     Marland Kitchen dexamethasone (DECADRON) 4 MG tablet 2 tablet p.o. twice daily the day before, day of and day after the chemotherapy every 3 weeks 40 tablet  0  . doxepin (SINEQUAN) 10 MG/ML solution Mix 2.5 ml in 2.5 ml of water and rinse in mouth for 1 minute twice daily. Spit out solution after rinsing mouth. 120 mL 12  . esomeprazole (NEXIUM) 20 MG capsule Take 20 mg by mouth every other day.    . lidocaine (XYLOCAINE) 2 % solution Use as directed 5 mLs in the mouth or throat as needed for mouth pain. Rinse and spit 200 mL 3  . lisinopril (PRINIVIL,ZESTRIL) 5 MG tablet TAKE 1 TABLET BY MOUTH EVERY DAY 90 tablet 3  . magic mouthwash SOLN Take 5 mLs by mouth 4 (four) times daily. Components hydrocortisone 60 mg, Nystatin 30 ml, Benadryl 12.74m/5 ml/ 240 mL 1  . metoprolol tartrate (LOPRESSOR) 25 MG tablet Take 12.5 mg by mouth 2 (two) times daily.    .Marland KitchenoxyCODONE-acetaminophen (PERCOCET/ROXICET) 5-325 MG tablet Take 1 tablet by mouth every 6 (six) hours as needed for severe pain. 30 tablet 0  . prochlorperazine (COMPAZINE) 10 MG tablet Take 1 tablet (10 mg total) by mouth every 6 (six) hours as needed for nausea or vomiting. 30 tablet 1  . acyclovir (ZOVIRAX) 200 MG/5ML suspension Take 10 mLs (400 mg total) by mouth 5 (five) times daily. (Patient not taking: Reported on 10/22/2018) 350 mL 0  . cyclobenzaprine (FLEXERIL) 5 MG tablet Take 1 tablet (5 mg total) by mouth 3 (three) times daily as needed for muscle spasms. (Patient not taking: Reported on 10/22/2018) 30 tablet 0   No current facility-administered medications for this visit.     SURGICAL HISTORY:  Past Surgical History:  Procedure Laterality Date  . CARDIAC CATHETERIZATION  06/14/2017  . CHOLECYSTECTOMY N/A 12/29/2017   Procedure: LAPAROSCOPIC CHOLECYSTECTOMY;  Surgeon: RRalene Ok MD;  Location: [facility];  Service: General;  Laterality: N/A;  . CHOLECYSTECTOMY, LAPAROSCOPIC N/A 12/29/2017  . CORONARY ARTERY BYPASS GRAFT N/A 06/19/2017   Procedure: CORONARY ARTERY BYPASS GRAFTING (CABG), OFF PUMP, times one using the left internal mammary artery to LAD;  Surgeon: GGrace Isaac MD;   Location: [facility]  Service: Open Heart Surgery;  Laterality: N/A;  . ESOPHAGOGASTRODUODENOSCOPY (EGD) WITH PROPOFOL N/A 11/30/2017   Procedure: ESOPHAGOGASTRODUODENOSCOPY (EGD) WITH PROPOFOL;  Surgeon: GGatha Mayer MD;  Location: [facility];  Service: Endoscopy;  Laterality: N/A;  . LEFT HEART CATH AND CORONARY ANGIOGRAPHY N/A 06/14/2017   Procedure: LEFT HEART CATH AND CORONARY ANGIOGRAPHY;  Surgeon: CSherren Mocha MD;  [facility];  Service: Cardiovascular;  Laterality: N/A;  . TEE WITHOUT CARDIOVERSION N/A 06/19/2017   Procedure: TRANSESOPHAGEAL ECHOCARDIOGRAM (TEE);  Surgeon: GGrace Isaac MD;  [facility]  Service: Open Heart Surgery;  Laterality: N/A;  . TONSILLECTOMY  ~ 1977    REVIEW OF SYSTEMS:   Review of Systems  Constitutional: Positive for unexpected weight change. Negative for chills, fatigue, and fever. HENT:  Positive for mouth sores (improving). Negative for nosebleeds, sore throat and trouble swallowing.   Eyes: Negative for eye problems and icterus.  Respiratory: Positive for mild shortness of breath in the morning. Negative for cough, hemoptysis, and wheezing.   Cardiovascular: Negative  for chest pain and leg swelling.  Gastrointestinal: Negative for abdominal pain, constipation, diarrhea, nausea and vomiting.  Genitourinary: Negative for bladder incontinence, difficulty urinating, dysuria, frequency and hematuria.   Musculoskeletal: Negative for back pain, gait problem, neck pain and neck stiffness.  Skin: Negative for itching and rash.  Neurological: Negative for dizziness, extremity weakness, gait problem, headaches, light-headedness and seizures.  Hematological: Negative for adenopathy. Does not bruise/bleed easily.  Psychiatric/Behavioral: Negative for confusion, depression and sleep disturbance. The patient is not nervous/anxious.     PHYSICAL EXAMINATION:  Blood pressure 132/84, pulse 100, temperature 98 F (36.7 C), temperature  source Oral, resp. rate 18, height 5' 10" (1.778 m), weight 194 lb 12.8 oz (88.4 kg), SpO2 100 %.  ECOG PERFORMANCE STATUS: 1 - Symptomatic but completely ambulatory  Physical Exam  Constitutional: Oriented to person, place, and time and well-developed, well-nourished, and in no distress. No distress.  HENT:  Head: Normocephalic and atraumatic.  Mouth/Throat: No erythema, ulcerations, or thrush. Oropharynx is clear and moist. No oropharyngeal exudate.  Eyes: Conjunctivae are normal. Right eye exhibits no discharge. Left eye exhibits no discharge. No scleral icterus.  Neck: Normal range of motion. Neck supple.  Cardiovascular: Normal rate, regular rhythm, normal heart sounds and intact distal pulses.   Pulmonary/Chest: Decreased breath sounds in the right lower lobe. Effort normal and breath sounds normal in all other lung fields. No respiratory distress. No wheezes. No rales.  Abdominal: Soft. Bowel sounds are normal. Exhibits no distension and no mass. There is no tenderness.  Musculoskeletal: Normal range of motion. Exhibits no edema.  Lymphadenopathy:    No cervical adenopathy.  Neurological: Alert and oriented to person, place, and time. Exhibits normal muscle tone. Gait normal. Coordination normal.  Skin: Skin is warm and dry. No rash noted. Not diaphoretic. No erythema. No pallor.  Psychiatric: Mood, memory and judgment normal.  Vitals reviewed.  LABORATORY DATA: Lab Results  Component Value Date   WBC 10.7 (H) 10/22/2018   HGB 11.1 (L) 10/22/2018   HCT 34.4 (L) 10/22/2018   MCV 90.8 10/22/2018   PLT 244 10/22/2018      Chemistry      Component Value Date/Time   NA 139 10/22/2018 0842   NA 141 07/17/2017 1006   K 4.3 10/22/2018 0842   CL 106 10/22/2018 0842   CO2 22 10/22/2018 0842   BUN 15 10/22/2018 0842   BUN 12 07/17/2017 1006   CREATININE 0.82 10/22/2018 0842      Component Value Date/Time   CALCIUM 10.0 10/22/2018 0842   ALKPHOS 115 10/22/2018 0842   AST 18  10/22/2018 0842   ALT 22 10/22/2018 0842   BILITOT 0.4 10/22/2018 0842       RADIOGRAPHIC STUDIES:  No results found.   ASSESSMENT/PLAN:  This is a very pleasant 48 year old Caucasian male with stage IV non-small cell lung cancer, adenocarcinoma with a resistant EGFR mutation in exon 20 and PDL 1 expression of 5%. The patient completed a course of palliative radiotherapy to the cervical spine as well as left hip under the care of Dr. Sondra Come.Marland Kitchen He completed induction systemic chemotherapy with carboplatin, Alimta and Keytruda status post 3 cycles.  He had no evidence for disease progression after the induction phase of his treatment. The patient was started on maintenance treatment with Alimta and Ketruda (pembrolizumab) status post 12 cycles.  This was discontinued secondary to disease progression. He is currently undergoing treatment with Cyramza and docetaxel.  He is status post 2  cycles.  He developed fatigue as well as thrush and mucositis.  He also lost ~13 lbs over a 4 week period secondary to pain with eating.   The patient was seen with Dr. [name] today.  The patient's mucositis has improved. Labs were reviewed with the patient. We recommend that he proceed with cycle 3 today as scheduled.   I will arrange for a restaging CT scan be performed prior to the next visit.   We will see him back for a follow-up visit in 3 weeks for evaluation and discussion of the scan results before starting cycle #4  Recommend he continue with his current regimen for mucositis as it is helping him. We will continue to monitor his weight closely at subsequent visits.   The patient was advised to call immediately if he has any concerning symptoms in the interval. The patient voices understanding of current disease status and treatment options and is in agreement with the current care plan. All questions were answered. The patient knows to call the clinic with any problems, questions or concerns. We can  certainly see the patient much sooner if necessary   Orders Placed This Encounter  Procedures  . CT Abdomen Pelvis W Contrast    Standing Status:   Future    Standing Expiration Date:   10/22/2019    Order Specific Question:   ** REASON FOR EXAM (FREE TEXT)    Answer:   Restaging for Lung Cancer    Order Specific Question:   If indicated for the ordered procedure, I authorize the administration of contrast media per Radiology protocol    Answer:   Yes    Order Specific Question:   Preferred imaging location?    Answer:   [facility]    Order Specific Question:   Is Oral Contrast requested for this exam?    Answer:   Yes, Per Radiology protocol    Order Specific Question:   Radiology Contrast Protocol - do NOT remove file path    Answer:   _0 [email or web address]  . CT Chest W Contrast    Standing Status:   Future    Standing Expiration Date:   10/22/2019    Order Specific Question:   ** REASON FOR EXAM (FREE TEXT)    Answer:   Restaging CT for lung cancer    Order Specific Question:   If indicated for the ordered procedure, I authorize the administration of contrast media per Radiology protocol    Answer:   Yes    Order Specific Question:   Preferred imaging location?    Answer:   [facility]    Order Specific Question:   Radiology Contrast Protocol - do NOT remove file path    Answer:   _1 [email or web address]  . CBC with Differential ([facility] Only)    Standing Status:   Standing    Number of Occurrences:   20    Standing Expiration Date:   10/22/2019  . CMP ([facility] only)    Standing Status:   Standing    Number of Occurrences:   15    Standing Expiration Date:   10/22/2019     Tobe Sos Heilingoetter, PA-C 10/22/18  ADDENDUM: Hematology/Oncology Attending: I had a face-to-face encounter with the patient today.  I recommended his care plan.  This is a very pleasant 48 years old white male with  metastatic non-small cell lung cancer, adenocarcinoma status post induction systemic chemotherapy followed by maintenance treatment with Alimta and [place]  had discontinued secondary to disease progression.  The patient is currently undergoing second line chemotherapy with docetaxel and Cyramza status post 2 cycles.  He has been tolerating this treatment well except for fatigue as well as oral mucositis.  He is currently on several regimen to help with the oral mucositis and he has some improvement. I recommended for him to proceed with cycle #3 today. We will see him back for follow-up visit in 3 weeks for evaluation after repeating CT scan of the chest, abdomen and pelvis for restaging of his disease. The patient was advised to call immediately if he has any concerning symptoms in the interval. Disclaimer: This note was dictated with voice recognition software. Similar sounding words can inadvertently be transcribed and may be missed upon review. Eilleen Kempf, MD 10/22/18

## 2018-10-22 NOTE — Patient Instructions (Addendum)
Coronavirus (COVID-19) Are you at risk?  Are you at risk for the Coronavirus (COVID-19)?  To be considered HIGH RISK for Coronavirus (COVID-19), you have to meet the following criteria:  . Traveled to [place], [place], [place], [place] or [place]; or in the [place] to [place], [place], [place], or [place]; and have fever, cough, and shortness of breath within the last 2 weeks of travel OR . Been in close contact with a person diagnosed with COVID-19 within the last 2 weeks and have fever, cough, and shortness of breath . IF YOU DO NOT MEET THESE CRITERIA, YOU ARE CONSIDERED LOW RISK FOR COVID-19.  What to do if you are HIGH RISK for COVID-19?  . If you are having a medical emergency, call 911. . Seek medical care right away. Before you go to a doctor's office, urgent care or emergency department, call ahead and tell them about your recent travel, contact with someone diagnosed with COVID-19, and your symptoms. You should receive instructions from your physician's office regarding next steps of care.  . When you arrive at healthcare provider, tell the healthcare staff immediately you have returned from visiting [place], [place], [place], [place] or [place]; or traveled in the [place] to [place], [place], [place], or [place]; in the last two weeks or you have been in close contact with a person diagnosed with COVID-19 in the last 2 weeks.   . Tell the health care staff about your symptoms: fever, cough and shortness of breath. . After you have been seen by a medical provider, you will be either: o Tested for (COVID-19) and discharged home on quarantine except to seek medical care if symptoms worsen, and asked to  - Stay home and avoid contact with others until you get your results (4-5 days)  - Avoid travel on public transportation if possible (such as bus, train, or airplane) or o Sent to the Emergency Department by EMS for evaluation, COVID-19 testing, and possible  admission depending on your condition and test results.  What to do if you are LOW RISK for COVID-19?  Reduce your risk of any infection by using the same precautions used for avoiding the common cold or flu:  . Wash your hands often with soap and warm water for at least 20 seconds.  If soap and water are not readily available, use an alcohol-based hand sanitizer with at least 60% alcohol.  . If coughing or sneezing, cover your mouth and nose by coughing or sneezing into the elbow areas of your shirt or coat, into a tissue or into your sleeve (not your hands). . Avoid shaking hands with others and consider head nods or verbal greetings only. . Avoid touching your eyes, nose, or mouth with unwashed hands.  . Avoid close contact with people who are sick. . Avoid places or events with large numbers of people in one location, like concerts or sporting events. . Carefully consider travel plans you have or are making. . If you are planning any travel outside or inside the [place], visit the CDC's Travelers' Health webpage for the latest health notices. . If you have some symptoms but not all symptoms, continue to monitor at home and seek medical attention if your symptoms worsen. . If you are having a medical emergency, call 911.   ADDITIONAL HEALTHCARE OPTIONS FOR PATIENTS  [facility] / e-Visit: [email or web address]         [facility]: [phone]  [facility]   Urgent Care: [facility]: [facility] Discharge Instructions for Patients Receiving Chemotherapy  Today you received the following chemotherapy agents Cyramza and Taxotere  To help prevent nausea and vomiting after your treatment, we encourage you to take your nausea medication as directed.    If you develop nausea and vomiting that is not controlled by your nausea medication, call the clinic.    BELOW ARE SYMPTOMS THAT SHOULD BE REPORTED IMMEDIATELY:  *FEVER GREATER THAN 100.5 F  *CHILLS WITH OR WITHOUT FEVER  NAUSEA AND VOMITING THAT IS NOT CONTROLLED WITH YOUR NAUSEA MEDICATION  *UNUSUAL SHORTNESS OF BREATH  *UNUSUAL BRUISING OR BLEEDING  TENDERNESS IN MOUTH AND THROAT WITH OR WITHOUT PRESENCE OF ULCERS  *URINARY PROBLEMS  *BOWEL PROBLEMS  UNUSUAL RASH Items with * indicate a potential emergency and should be followed up as soon as possible.  Feel free to call the clinic should you have any questions or concerns. The clinic phone number is [phone].  Please show the [facility] at check-in to the Emergency Department and triage nurse.

## 2018-10-23 ENCOUNTER — Ambulatory Visit: Payer: Medicaid Other | Admitting: Physician Assistant

## 2018-10-23 ENCOUNTER — Telehealth: Payer: Self-pay | Admitting: Internal Medicine

## 2018-10-23 ENCOUNTER — Ambulatory Visit: Payer: Medicaid Other

## 2018-10-23 ENCOUNTER — Encounter: Payer: Self-pay | Admitting: *Deleted

## 2018-10-23 ENCOUNTER — Other Ambulatory Visit: Payer: Medicaid Other

## 2018-10-23 NOTE — Telephone Encounter (Signed)
Patient already on schedule as requested per 3/23 los. Central radiology will call re scan.

## 2018-10-24 ENCOUNTER — Inpatient Hospital Stay: Payer: Medicaid Other | Admitting: *Deleted

## 2018-10-24 ENCOUNTER — Inpatient Hospital Stay: Payer: Medicaid Other

## 2018-10-24 ENCOUNTER — Other Ambulatory Visit: Payer: Self-pay

## 2018-10-24 VITALS — BP 111/84 | HR 63 | Temp 97.0°F | Resp 18

## 2018-10-24 DIAGNOSIS — C3491 Malignant neoplasm of unspecified part of right bronchus or lung: Secondary | ICD-10-CM

## 2018-10-24 DIAGNOSIS — Z5112 Encounter for antineoplastic immunotherapy: Secondary | ICD-10-CM | POA: Diagnosis not present

## 2018-10-24 MED ORDER — PEGFILGRASTIM INJECTION 6 MG/0.6ML ~~LOC~~
PREFILLED_SYRINGE | SUBCUTANEOUS | Status: AC
  Filled 2018-10-24: qty 0.6

## 2018-10-24 MED ORDER — PEGFILGRASTIM INJECTION 6 MG/0.6ML ~~LOC~~
6.0000 mg | PREFILLED_SYRINGE | Freq: Once | SUBCUTANEOUS | Status: AC
  Administered 2018-10-24: 6 mg via SUBCUTANEOUS

## 2018-10-30 ENCOUNTER — Other Ambulatory Visit: Payer: Self-pay

## 2018-10-30 ENCOUNTER — Inpatient Hospital Stay: Payer: Medicaid Other

## 2018-10-30 DIAGNOSIS — Z5112 Encounter for antineoplastic immunotherapy: Secondary | ICD-10-CM | POA: Diagnosis not present

## 2018-10-30 DIAGNOSIS — C3491 Malignant neoplasm of unspecified part of right bronchus or lung: Secondary | ICD-10-CM

## 2018-10-30 LAB — CBC WITH DIFFERENTIAL (CANCER CENTER ONLY)
Abs Immature Granulocytes: 0.08 10*3/uL — ABNORMAL HIGH (ref 0.00–0.07)
Basophils Absolute: 0 10*3/uL (ref 0.0–0.1)
Basophils Relative: 0 %
Eosinophils Absolute: 0 10*3/uL (ref 0.0–0.5)
Eosinophils Relative: 0 %
HCT: 32.6 % — ABNORMAL LOW (ref 39.0–52.0)
Hemoglobin: 10.5 g/dL — ABNORMAL LOW (ref 13.0–17.0)
Immature Granulocytes: 2 %
LYMPHS PCT: 29 %
Lymphs Abs: 1.3 10*3/uL (ref 0.7–4.0)
MCH: 29.2 pg (ref 26.0–34.0)
MCHC: 32.2 g/dL (ref 30.0–36.0)
MCV: 90.6 fL (ref 80.0–100.0)
Monocytes Absolute: 0.9 10*3/uL (ref 0.1–1.0)
Monocytes Relative: 20 %
Neutro Abs: 2.2 10*3/uL (ref 1.7–7.7)
Neutrophils Relative %: 49 %
PLATELETS: 223 10*3/uL (ref 150–400)
RBC: 3.6 MIL/uL — ABNORMAL LOW (ref 4.22–5.81)
RDW: 18.2 % — ABNORMAL HIGH (ref 11.5–15.5)
WBC Morphology: INCREASED
WBC: 4.6 10*3/uL (ref 4.0–10.5)
nRBC: 1.3 % — ABNORMAL HIGH (ref 0.0–0.2)

## 2018-10-30 LAB — CMP (CANCER CENTER ONLY)
ALT: 22 U/L (ref 0–44)
AST: 21 U/L (ref 15–41)
Albumin: 2.9 g/dL — ABNORMAL LOW (ref 3.5–5.0)
Alkaline Phosphatase: 115 U/L (ref 38–126)
Anion gap: 12 (ref 5–15)
BUN: 13 mg/dL (ref 6–20)
CHLORIDE: 99 mmol/L (ref 98–111)
CO2: 24 mmol/L (ref 22–32)
Calcium: 9.5 mg/dL (ref 8.9–10.3)
Creatinine: 0.93 mg/dL (ref 0.61–1.24)
GFR, Est AFR Am: >60 mL/min (ref >60)
Glucose, Bld: 100 mg/dL — ABNORMAL HIGH (ref 70–99)
POTASSIUM: 4.1 mmol/L (ref 3.5–5.1)
Sodium: 135 mmol/L (ref 135–145)
Total Bilirubin: 0.8 mg/dL (ref 0.3–1.2)
Total Protein: 7.1 g/dL (ref 6.5–8.1)

## 2018-11-01 ENCOUNTER — Telehealth: Payer: Self-pay | Admitting: Medical Oncology

## 2018-11-01 ENCOUNTER — Other Ambulatory Visit: Payer: Self-pay | Admitting: Internal Medicine

## 2018-11-01 DIAGNOSIS — K1230 Oral mucositis (ulcerative), unspecified: Secondary | ICD-10-CM

## 2018-11-01 MED ORDER — OXYCODONE-ACETAMINOPHEN 5-325 MG PO TABS: 1.0000 | ORAL_TABLET | Freq: Four times a day (QID) | ORAL | 0 refills | Status: DC | PRN

## 2018-11-01 MED FILL — OXYCODONE-ACETAMINOPHEN 5-3: 5-325 | 8 days supply | Qty: 30 | Fill #0

## 2018-11-01 NOTE — Telephone Encounter (Signed)
Requests med refill for oxycodone

## 2018-11-06 ENCOUNTER — Ambulatory Visit (HOSPITAL_COMMUNITY)
Admission: RE | Admit: 2018-11-06 | Discharge: 2018-11-06 | Disposition: A | Discharge status: Home / Self Care | Payer: Medicaid Other | Source: Ambulatory Visit | Attending: Physician Assistant | Admitting: Physician Assistant

## 2018-11-06 ENCOUNTER — Other Ambulatory Visit: Payer: Medicaid Other

## 2018-11-06 ENCOUNTER — Ambulatory Visit: Payer: Medicaid Other | Admitting: Internal Medicine

## 2018-11-06 ENCOUNTER — Ambulatory Visit: Payer: Medicaid Other

## 2018-11-06 ENCOUNTER — Inpatient Hospital Stay: Payer: Medicaid Other | Attending: Internal Medicine

## 2018-11-06 ENCOUNTER — Other Ambulatory Visit: Payer: Self-pay

## 2018-11-06 DIAGNOSIS — C3491 Malignant neoplasm of unspecified part of right bronchus or lung: Secondary | ICD-10-CM

## 2018-11-06 DIAGNOSIS — Z7689 Persons encountering health services in other specified circumstances: Secondary | ICD-10-CM | POA: Insufficient documentation

## 2018-11-06 DIAGNOSIS — C787 Secondary malignant neoplasm of liver and intrahepatic bile duct: Secondary | ICD-10-CM | POA: Diagnosis not present

## 2018-11-06 DIAGNOSIS — C3411 Malignant neoplasm of upper lobe, right bronchus or lung: Secondary | ICD-10-CM | POA: Insufficient documentation

## 2018-11-06 DIAGNOSIS — J90 Pleural effusion, not elsewhere classified: Secondary | ICD-10-CM | POA: Insufficient documentation

## 2018-11-06 DIAGNOSIS — Z9221 Personal history of antineoplastic chemotherapy: Secondary | ICD-10-CM | POA: Diagnosis not present

## 2018-11-06 DIAGNOSIS — C782 Secondary malignant neoplasm of pleura: Secondary | ICD-10-CM | POA: Diagnosis not present

## 2018-11-06 DIAGNOSIS — Z5112 Encounter for antineoplastic immunotherapy: Secondary | ICD-10-CM | POA: Diagnosis not present

## 2018-11-06 DIAGNOSIS — R5383 Other fatigue: Secondary | ICD-10-CM | POA: Diagnosis not present

## 2018-11-06 DIAGNOSIS — Z5111 Encounter for antineoplastic chemotherapy: Secondary | ICD-10-CM | POA: Insufficient documentation

## 2018-11-06 DIAGNOSIS — C778 Secondary and unspecified malignant neoplasm of lymph nodes of multiple regions: Secondary | ICD-10-CM | POA: Insufficient documentation

## 2018-11-06 DIAGNOSIS — Z7982 Long term (current) use of aspirin: Secondary | ICD-10-CM | POA: Diagnosis not present

## 2018-11-06 DIAGNOSIS — C7951 Secondary malignant neoplasm of bone: Secondary | ICD-10-CM | POA: Diagnosis not present

## 2018-11-06 DIAGNOSIS — I252 Old myocardial infarction: Secondary | ICD-10-CM | POA: Diagnosis not present

## 2018-11-06 DIAGNOSIS — Z79899 Other long term (current) drug therapy: Secondary | ICD-10-CM | POA: Diagnosis not present

## 2018-11-06 DIAGNOSIS — M545 Low back pain: Secondary | ICD-10-CM | POA: Diagnosis not present

## 2018-11-06 DIAGNOSIS — K219 Gastro-esophageal reflux disease without esophagitis: Secondary | ICD-10-CM | POA: Diagnosis not present

## 2018-11-06 DIAGNOSIS — R05 Cough: Secondary | ICD-10-CM | POA: Insufficient documentation

## 2018-11-06 LAB — CBC WITH DIFFERENTIAL (CANCER CENTER ONLY)
Abs Immature Granulocytes: 0.08 10*3/uL — ABNORMAL HIGH (ref 0.00–0.07)
Basophils Absolute: 0.1 10*3/uL (ref 0.0–0.1)
Basophils Relative: 1 %
Eosinophils Absolute: 0 10*3/uL (ref 0.0–0.5)
Eosinophils Relative: 0 %
HCT: 31.4 % — ABNORMAL LOW (ref 39.0–52.0)
Hemoglobin: 9.9 g/dL — ABNORMAL LOW (ref 13.0–17.0)
Immature Granulocytes: 1 %
Lymphocytes Relative: 14 %
Lymphs Abs: 1.6 10*3/uL (ref 0.7–4.0)
MCH: 28.9 pg (ref 26.0–34.0)
MCHC: 31.5 g/dL (ref 30.0–36.0)
MCV: 91.8 fL (ref 80.0–100.0)
Monocytes Absolute: 0.7 10*3/uL (ref 0.1–1.0)
Monocytes Relative: 6 %
Neutro Abs: 8.7 10*3/uL — ABNORMAL HIGH (ref 1.7–7.7)
Neutrophils Relative %: 78 %
Platelet Count: 226 10*3/uL (ref 150–400)
RBC: 3.42 MIL/uL — ABNORMAL LOW (ref 4.22–5.81)
RDW: 19.4 % — ABNORMAL HIGH (ref 11.5–15.5)
WBC Count: 11.1 10*3/uL — ABNORMAL HIGH (ref 4.0–10.5)
nRBC: 0 % (ref 0.0–0.2)

## 2018-11-06 LAB — CMP (CANCER CENTER ONLY)
ALT: 10 U/L (ref 0–44)
AST: 18 U/L (ref 15–41)
Albumin: 2.9 g/dL — ABNORMAL LOW (ref 3.5–5.0)
Alkaline Phosphatase: 127 U/L — ABNORMAL HIGH (ref 38–126)
Anion gap: 11 (ref 5–15)
BUN: 9 mg/dL (ref 6–20)
CO2: 25 mmol/L (ref 22–32)
Calcium: 9.5 mg/dL (ref 8.9–10.3)
Chloride: 101 mmol/L (ref 98–111)
Creatinine: 0.95 mg/dL (ref 0.61–1.24)
GFR, Est AFR Am: >60 mL/min (ref >60)
GFR, Estimated: >60 mL/min (ref >60)
Glucose, Bld: 93 mg/dL (ref 70–99)
Potassium: 4.1 mmol/L (ref 3.5–5.1)
Sodium: 137 mmol/L (ref 135–145)
Total Bilirubin: 0.5 mg/dL (ref 0.3–1.2)
Total Protein: 6.9 g/dL (ref 6.5–8.1)

## 2018-11-06 MED ORDER — SODIUM CHLORIDE (PF) 0.9 % IJ SOLN
INTRAMUSCULAR | Status: AC
  Filled 2018-11-06: qty 50

## 2018-11-06 MED ORDER — IOHEXOL 300 MG/ML  SOLN
100.0000 mL | Freq: Once | INTRAMUSCULAR | Status: AC | PRN
  Administered 2018-11-06: 100 mL via INTRAVENOUS

## 2018-11-13 ENCOUNTER — Inpatient Hospital Stay: Payer: Medicaid Other

## 2018-11-13 ENCOUNTER — Other Ambulatory Visit: Payer: Self-pay

## 2018-11-13 ENCOUNTER — Encounter: Payer: Self-pay | Admitting: Internal Medicine

## 2018-11-13 ENCOUNTER — Inpatient Hospital Stay (HOSPITAL_BASED_OUTPATIENT_CLINIC_OR_DEPARTMENT_OTHER): Payer: Medicaid Other | Admitting: Internal Medicine

## 2018-11-13 VITALS — BP 116/81 | HR 77

## 2018-11-13 VITALS — BP 121/89 | HR 89 | Temp 98.2°F | Resp 18 | Ht 70.0 in | Wt 190.7 lb

## 2018-11-13 DIAGNOSIS — Z7689 Persons encountering health services in other specified circumstances: Secondary | ICD-10-CM | POA: Diagnosis not present

## 2018-11-13 DIAGNOSIS — M545 Low back pain: Secondary | ICD-10-CM

## 2018-11-13 DIAGNOSIS — R5383 Other fatigue: Secondary | ICD-10-CM

## 2018-11-13 DIAGNOSIS — C3491 Malignant neoplasm of unspecified part of right bronchus or lung: Secondary | ICD-10-CM

## 2018-11-13 DIAGNOSIS — C7951 Secondary malignant neoplasm of bone: Secondary | ICD-10-CM

## 2018-11-13 DIAGNOSIS — Z5111 Encounter for antineoplastic chemotherapy: Secondary | ICD-10-CM

## 2018-11-13 DIAGNOSIS — Z9221 Personal history of antineoplastic chemotherapy: Secondary | ICD-10-CM

## 2018-11-13 DIAGNOSIS — I252 Old myocardial infarction: Secondary | ICD-10-CM

## 2018-11-13 DIAGNOSIS — R05 Cough: Secondary | ICD-10-CM

## 2018-11-13 DIAGNOSIS — K1230 Oral mucositis (ulcerative), unspecified: Secondary | ICD-10-CM

## 2018-11-13 DIAGNOSIS — Z7982 Long term (current) use of aspirin: Secondary | ICD-10-CM

## 2018-11-13 DIAGNOSIS — C778 Secondary and unspecified malignant neoplasm of lymph nodes of multiple regions: Secondary | ICD-10-CM

## 2018-11-13 DIAGNOSIS — C3411 Malignant neoplasm of upper lobe, right bronchus or lung: Secondary | ICD-10-CM

## 2018-11-13 DIAGNOSIS — K219 Gastro-esophageal reflux disease without esophagitis: Secondary | ICD-10-CM

## 2018-11-13 DIAGNOSIS — Z79899 Other long term (current) drug therapy: Secondary | ICD-10-CM

## 2018-11-13 DIAGNOSIS — Z5112 Encounter for antineoplastic immunotherapy: Secondary | ICD-10-CM | POA: Diagnosis not present

## 2018-11-13 DIAGNOSIS — C782 Secondary malignant neoplasm of pleura: Secondary | ICD-10-CM

## 2018-11-13 DIAGNOSIS — C787 Secondary malignant neoplasm of liver and intrahepatic bile duct: Secondary | ICD-10-CM

## 2018-11-13 DIAGNOSIS — J90 Pleural effusion, not elsewhere classified: Secondary | ICD-10-CM

## 2018-11-13 LAB — CBC WITH DIFFERENTIAL (CANCER CENTER ONLY)
Abs Immature Granulocytes: 0.09 10*3/uL — ABNORMAL HIGH (ref 0.00–0.07)
Basophils Absolute: 0 10*3/uL (ref 0.0–0.1)
Basophils Relative: 0 %
Eosinophils Absolute: 0 10*3/uL (ref 0.0–0.5)
Eosinophils Relative: 0 %
HCT: 34.2 % — ABNORMAL LOW (ref 39.0–52.0)
Hemoglobin: 10.6 g/dL — ABNORMAL LOW (ref 13.0–17.0)
Immature Granulocytes: 1 %
Lymphocytes Relative: 7 %
Lymphs Abs: 1 10*3/uL (ref 0.7–4.0)
MCH: 29.3 pg (ref 26.0–34.0)
MCHC: 31 g/dL (ref 30.0–36.0)
MCV: 94.5 fL (ref 80.0–100.0)
Monocytes Absolute: 0.3 10*3/uL (ref 0.1–1.0)
Monocytes Relative: 2 %
Neutro Abs: 13.5 10*3/uL — ABNORMAL HIGH (ref 1.7–7.7)
Neutrophils Relative %: 90 %
Platelet Count: 316 10*3/uL (ref 150–400)
RBC: 3.62 MIL/uL — ABNORMAL LOW (ref 4.22–5.81)
RDW: 20.3 % — ABNORMAL HIGH (ref 11.5–15.5)
WBC Count: 14.9 10*3/uL — ABNORMAL HIGH (ref 4.0–10.5)
nRBC: 0 % (ref 0.0–0.2)

## 2018-11-13 LAB — CMP (CANCER CENTER ONLY)
ALT: 11 U/L (ref 0–44)
AST: 26 U/L (ref 15–41)
Albumin: 3.2 g/dL — ABNORMAL LOW (ref 3.5–5.0)
Alkaline Phosphatase: 117 U/L (ref 38–126)
Anion gap: 16 — ABNORMAL HIGH (ref 5–15)
BUN: 12 mg/dL (ref 6–20)
CO2: 18 mmol/L — ABNORMAL LOW (ref 22–32)
Calcium: 10.4 mg/dL — ABNORMAL HIGH (ref 8.9–10.3)
Chloride: 105 mmol/L (ref 98–111)
Creatinine: 0.84 mg/dL (ref 0.61–1.24)
GFR, Est AFR Am: >60 mL/min (ref >60)
GFR, Estimated: >60 mL/min (ref >60)
Glucose, Bld: 158 mg/dL — ABNORMAL HIGH (ref 70–99)
Potassium: 4.5 mmol/L (ref 3.5–5.1)
Sodium: 139 mmol/L (ref 135–145)
Total Bilirubin: 0.4 mg/dL (ref 0.3–1.2)
Total Protein: 8 g/dL (ref 6.5–8.1)

## 2018-11-13 LAB — TOTAL PROTEIN, URINE DIPSTICK: Protein, ur: NEGATIVE mg/dL

## 2018-11-13 MED ORDER — DOXEPIN HCL 10 MG/ML PO CONC: ORAL | 12 refills | Status: DC

## 2018-11-13 MED ORDER — PEGFILGRASTIM 6 MG/0.6ML ~~LOC~~ PSKT
6.0000 mg | PREFILLED_SYRINGE | Freq: Once | SUBCUTANEOUS | Status: AC
  Administered 2018-11-13: 16:00:00 6 mg via SUBCUTANEOUS

## 2018-11-13 MED ORDER — PEGFILGRASTIM 6 MG/0.6ML ~~LOC~~ PSKT
PREFILLED_SYRINGE | SUBCUTANEOUS | Status: AC
  Filled 2018-11-13: qty 0.6

## 2018-11-13 MED ORDER — DIPHENHYDRAMINE HCL 50 MG/ML IJ SOLN
50.0000 mg | Freq: Once | INTRAMUSCULAR | Status: AC
  Administered 2018-11-13: 12:00:00 50 mg via INTRAVENOUS

## 2018-11-13 MED ORDER — ACETAMINOPHEN 325 MG PO TABS
ORAL_TABLET | ORAL | Status: AC
  Filled 2018-11-13: qty 1

## 2018-11-13 MED ORDER — SODIUM CHLORIDE 0.9 % IV SOLN
Freq: Once | INTRAVENOUS | Status: AC
  Administered 2018-11-13: 12:00:00 via INTRAVENOUS
  Filled 2018-11-13: qty 250

## 2018-11-13 MED ORDER — OXYCODONE-ACETAMINOPHEN 5-325 MG PO TABS: 1.0000 | ORAL_TABLET | Freq: Four times a day (QID) | ORAL | 0 refills | Status: DC | PRN

## 2018-11-13 MED ORDER — ACETAMINOPHEN 325 MG PO TABS
650.0000 mg | ORAL_TABLET | Freq: Once | ORAL | Status: AC
  Administered 2018-11-13: 650 mg via ORAL

## 2018-11-13 MED ORDER — DEXAMETHASONE SODIUM PHOSPHATE 10 MG/ML IJ SOLN
INTRAMUSCULAR | Status: AC
  Filled 2018-11-13: qty 1

## 2018-11-13 MED ORDER — SODIUM CHLORIDE 0.9 % IV SOLN
9.0000 mg/kg | Freq: Once | INTRAVENOUS | Status: AC
  Administered 2018-11-13: 14:00:00 900 mg via INTRAVENOUS
  Filled 2018-11-13: qty 90

## 2018-11-13 MED ORDER — SODIUM CHLORIDE 0.9 % IV SOLN
75.0000 mg/m2 | Freq: Once | INTRAVENOUS | Status: AC
  Administered 2018-11-13: 15:00:00 160 mg via INTRAVENOUS
  Filled 2018-11-13: qty 16

## 2018-11-13 MED ORDER — DEXAMETHASONE SODIUM PHOSPHATE 10 MG/ML IJ SOLN
10.0000 mg | Freq: Once | INTRAMUSCULAR | Status: AC
  Administered 2018-11-13: 12:00:00 10 mg via INTRAVENOUS

## 2018-11-13 MED ORDER — DIPHENHYDRAMINE HCL 50 MG/ML IJ SOLN
INTRAMUSCULAR | Status: AC
  Filled 2018-11-13: qty 1

## 2018-11-13 MED ORDER — SODIUM CHLORIDE 0.9 % IV SOLN
20.0000 mg | Freq: Once | INTRAVENOUS | Status: AC
  Administered 2018-11-13: 13:00:00 20 mg via INTRAVENOUS
  Filled 2018-11-13: qty 2

## 2018-11-13 NOTE — Patient Instructions (Signed)
[facility] Discharge Instructions for Patients Receiving Chemotherapy  Today you received the following chemotherapy agents cyramza & taxotere   To help prevent nausea and vomiting after your treatment, we encourage you to take your nausea medication as directed If you develop nausea and vomiting that is not controlled by your nausea medication, call the clinic.   BELOW ARE SYMPTOMS THAT SHOULD BE REPORTED IMMEDIATELY:  *FEVER GREATER THAN 100.5 F  *CHILLS WITH OR WITHOUT FEVER  NAUSEA AND VOMITING THAT IS NOT CONTROLLED WITH YOUR NAUSEA MEDICATION  *UNUSUAL SHORTNESS OF BREATH  *UNUSUAL BRUISING OR BLEEDING  TENDERNESS IN MOUTH AND THROAT WITH OR WITHOUT PRESENCE OF ULCERS  *URINARY PROBLEMS  *BOWEL PROBLEMS  UNUSUAL RASH Items with * indicate a potential emergency and should be followed up as soon as possible.  Feel free to call the clinic you have any questions or concerns. The clinic phone number is [phone].

## 2018-11-13 NOTE — Progress Notes (Signed)
Patient wished to try Neulasta Onpro for this cycle only. He will decided after this cycle whether or not he wants Onpro or to come back to the clinic for the injection.   Orders updated for cycle 4 only. Subsequent cycles will need to be updated pending patient preference.   Demetrius Charity, PharmD, [facility] Pharmacist Pharmacy Phone: [phone] 11/13/2018

## 2018-11-13 NOTE — Progress Notes (Signed)
[facility] [phone]   [phone]  OFFICE PROGRESS NOTE  [name], MD [address] [place] [address]  DIAGNOSIS: Stage IVB(T1b, N2, M1c)non-small cell lung cancer, adenocarcinoma presented with right upper lobe lung nodule in addition to right hilar and mediastinal lymphadenopathy as well as metastatic liver and bone disease diagnosed in February 2019.  Biomarker Findings Microsatellite status - MS-Stable Tumor Mutational Burden - TMB-Low (4 Muts/Mb) Genomic Findings For a complete list of the genes assayed, please refer to the Appendix. EGFR exon 20 insertion ([id number]) RB1 loss exons 9-17 TP53 [id number] 7 Disease relevant genes with no reportable alterations: KRAS, ALK, BRAF, MET, RET, ERBB2, ROS1  PDL 1 expression 5%  PRIOR THERAPY:  1) Palliative radiotherapy to the metastatic bone lesions in the cervical spine and left femur under the care of Dr. Sondra Come. 2) Systemic chemotherapy with carboplatin for AUC of 5, Alimta 500 mg/M2 and Keytruda 200 mg IV every 3 weeks.  First dose October 04, 2017.  Status post 3 cycles. 3) palliative stereotactic radiotherapy to the progressive liver lesion. 4)  Maintenance treatment with Alimta 500 mg/M2 and Keytruda 200 mg IV every 3 weeks.  Status post 12 cycles.  Last dose was giving August 14, 2018 discontinued secondary to disease progression.  CURRENT THERAPY: Systemic chemotherapy with docetaxel 75 mg/M2 and Cyramza 10 mg/KG every 3 weeks with Neulasta support.  First dose September 11, 2018.  Status post 3 cycles.  INTERVAL HISTORY: Anthony [name] 48 y.o. male returns to the clinic today for follow-up visit.  The patient is feeling fine today with no concerning complaints except for pain in the right hip area with radiation to the leg.  He also has shortness of breath with exertion.  He is worried about reaccumulation of his right pleural effusion.  He underwent  ultrasound thoracentesis few weeks ago with improvement of his symptoms.  He also continues to have mouth sores and requesting refill of the mouthwash.  The patient also requested refill of his pain medication.  He denied having any current chest pain but has shortness of breath at rest line increased with exertion with mild cough and no hemoptysis.  He denied having any nausea, vomiting, diarrhea or constipation.  He has no headache or visual changes.  He has been tolerating his treatment with chemotherapy well except for the fatigue.  The patient had repeat CT scan of the chest, abdomen and pelvis performed recently and he is here for evaluation and discussion of his scan results.   MEDICAL HISTORY: Past Medical History:  Diagnosis Date   Acid reflux    History of radiation therapy 09/21/17-10/04/17   C4 spine 30 Gy in 10 fractions, left femur 30 Gy in 10 fractions   Non-small cell lung cancer ([facility])    NSTEMI (non-ST elevated myocardial infarction) ([facility]) 06/13/2017   [name] 06/14/2017   Tailbone injury since 1993   "cracked"    ALLERGIES:  has No Known Allergies.  MEDICATIONS:  Current Outpatient Medications  Medication Sig Dispense Refill   acetaminophen (TYLENOL) 500 MG tablet Take 500-1,000 mg by mouth daily as needed for moderate pain or headache.     acyclovir (ZOVIRAX) 200 MG/5ML suspension Take 10 mLs (400 mg total) by mouth 5 (five) times daily. (Patient not taking: Reported on 10/22/2018) 350 mL 0   aspirin EC 81 MG EC tablet Take 1 tablet (81 mg total) by mouth daily.     atorvastatin (  LIPITOR) 40 MG tablet Take 1 tablet (40 mg total) by mouth daily. 90 tablet 3   cetirizine (ZYRTEC) 10 MG tablet Take 10 mg by mouth daily as needed for allergies.      cyclobenzaprine (FLEXERIL) 5 MG tablet Take 1 tablet (5 mg total) by mouth 3 (three) times daily as needed for muscle spasms. (Patient not taking: Reported on 10/22/2018) 30 tablet 0   dexamethasone (DECADRON) 4 MG  tablet 2 tablet p.o. twice daily the day before, day of and day after the chemotherapy every 3 weeks 40 tablet 0   doxepin (SINEQUAN) 10 MG/ML solution Mix 2.5 ml in 2.5 ml of water and rinse in mouth for 1 minute twice daily. Spit out solution after rinsing mouth. 120 mL 12   esomeprazole (NEXIUM) 20 MG capsule Take 20 mg by mouth every other day.     lidocaine (XYLOCAINE) 2 % solution Use as directed 5 mLs in the mouth or throat as needed for mouth pain. Rinse and spit 200 mL 3   lisinopril (PRINIVIL,ZESTRIL) 5 MG tablet TAKE 1 TABLET BY MOUTH EVERY DAY 90 tablet 3   magic mouthwash SOLN Take 5 mLs by mouth 4 (four) times daily. Components hydrocortisone 60 mg, Nystatin 30 ml, Benadryl 12.'5mg'$ /5 ml/ 240 mL 1   metoprolol tartrate (LOPRESSOR) 25 MG tablet Take 12.5 mg by mouth 2 (two) times daily.     oxyCODONE-acetaminophen (PERCOCET/ROXICET) 5-325 MG tablet Take 1 tablet by mouth every 6 (six) hours as needed for severe pain. 30 tablet 0   prochlorperazine (COMPAZINE) 10 MG tablet Take 1 tablet (10 mg total) by mouth every 6 (six) hours as needed for nausea or vomiting. 30 tablet 1   No current facility-administered medications for this visit.     SURGICAL HISTORY:  Past Surgical History:  Procedure Laterality Date   CARDIAC CATHETERIZATION  06/14/2017   CHOLECYSTECTOMY N/A 12/29/2017   Procedure: LAPAROSCOPIC CHOLECYSTECTOMY;  Surgeon: Ralene Ok, MD;  Location: [facility];  Service: General;  Laterality: N/A;   CHOLECYSTECTOMY, LAPAROSCOPIC N/A 12/29/2017   CORONARY ARTERY BYPASS GRAFT N/A 06/19/2017   Procedure: CORONARY ARTERY BYPASS GRAFTING (CABG), OFF PUMP, times one using the left internal mammary artery to LAD;  Surgeon: Grace Isaac, MD;  Location: [facility];  Service: Open Heart Surgery;  Laterality: N/A;   ESOPHAGOGASTRODUODENOSCOPY (EGD) WITH PROPOFOL N/A 11/30/2017   Procedure: ESOPHAGOGASTRODUODENOSCOPY (EGD) WITH PROPOFOL;  Surgeon: Gatha Mayer, MD;   Location: [facility];  Service: Endoscopy;  Laterality: N/A;   LEFT HEART CATH AND CORONARY ANGIOGRAPHY N/A 06/14/2017   Procedure: LEFT HEART CATH AND CORONARY ANGIOGRAPHY;  Surgeon: Sherren Mocha, MD;  Location: [facility];  Service: Cardiovascular;  Laterality: N/A;   TEE WITHOUT CARDIOVERSION N/A 06/19/2017   Procedure: TRANSESOPHAGEAL ECHOCARDIOGRAM (TEE);  Surgeon: Grace Isaac, MD;  Location: [facility];  Service: Open Heart Surgery;  Laterality: N/A;   TONSILLECTOMY  ~ 1977    REVIEW OF SYSTEMS:  Constitutional: positive for fatigue Eyes: negative Ears, nose, mouth, throat, and face: negative Respiratory: positive for dyspnea on exertion Cardiovascular: negative Gastrointestinal: negative Genitourinary:negative Integument/breast: negative Hematologic/lymphatic: negative Musculoskeletal:positive for back pain Neurological: negative Behavioral/Psych: negative Endocrine: negative Allergic/Immunologic: negative   PHYSICAL EXAMINATION: General appearance: alert, cooperative, fatigued and no distress Head: Normocephalic, without obvious abnormality, atraumatic Neck: no adenopathy, no JVD, supple, symmetrical, trachea midline and thyroid not enlarged, symmetric, no tenderness/mass/nodules Lymph nodes: Cervical, supraclavicular, and axillary nodes normal. Resp: diminished breath sounds RLL and dullness to percussion  RLL Back: symmetric, no curvature. ROM normal. No CVA tenderness. Cardio: regular rate and rhythm, S1, S2 normal, no murmur, click, rub or gallop GI: soft, non-tender; bowel sounds normal; no masses,  no organomegaly Extremities: extremities normal, atraumatic, no cyanosis or edema Neurologic: Alert and oriented X 3, normal strength and tone. Normal symmetric reflexes. Normal coordination and gait  ECOG PERFORMANCE STATUS: 1 - Symptomatic but completely ambulatory  Blood pressure 121/89, pulse 89, temperature 98.2 F (36.8 C), temperature source Oral,  resp. rate 18, height '5\' 10"'$  (1.778 m), weight 190 lb 11.2 oz (86.5 kg), SpO2 100 %.  LABORATORY DATA: Lab Results  Component Value Date   WBC 14.9 (H) 11/13/2018   HGB 10.6 (L) 11/13/2018   HCT 34.2 (L) 11/13/2018   MCV 94.5 11/13/2018   PLT 316 11/13/2018      Chemistry      Component Value Date/Time   NA 137 11/06/2018 1347   NA 141 07/17/2017 1006   K 4.1 11/06/2018 1347   CL 101 11/06/2018 1347   CO2 25 11/06/2018 1347   BUN 9 11/06/2018 1347   BUN 12 07/17/2017 1006   CREATININE 0.95 11/06/2018 1347      Component Value Date/Time   CALCIUM 9.5 11/06/2018 1347   ALKPHOS 127 (H) 11/06/2018 1347   AST 18 11/06/2018 1347   ALT 10 11/06/2018 1347   BILITOT 0.5 11/06/2018 1347       RADIOGRAPHIC STUDIES: Ct Chest W Contrast  Result Date: 11/06/2018 CLINICAL DATA:  Lung cancer restaging EXAM: CT CHEST, ABDOMEN, AND PELVIS WITH CONTRAST TECHNIQUE: Multidetector CT imaging of the chest, abdomen and pelvis was performed following the standard protocol during bolus administration of intravenous contrast. CONTRAST:  190m OMNIPAQUE IOHEXOL 300 MG/ML  SOLN COMPARISON:  None. FINDINGS: CT CHEST FINDINGS Cardiovascular: Coronary artery calcifications are identified in the LAD. The heart is unchanged. The thoracic aorta demonstrates no aneurysm or dissection. The central pulmonary arteries are normal. Mediastinum/Nodes: The thyroid and esophagus are normal. No cervical adenopathy identified. No axillary adenopathy identified. The 2 right CP angle nodes on image 37 of series 2 of the previous study have significantly improved. The larger of the 2 nodes measures 1.0 cm today versus 1.6 cm previously. The node anterior to the esophagus on the previous study is also significantly improved in the interval measuring 0.8 cm today versus 1.5 cm previously. This is seen on image 44, series 2. No new adenopathy in the mediastinum or hila. Lungs/Pleura: A right-sided pleural effusion is again  identified, similar to a little smaller in the interval. The dominant mass at the right lung base is decreased in size. This mass measured 8.5 x 5.7 x 6.4 cm previously versus 1.9 by 1.7 by 1.4 cm on the current study. The mass continues to involve the diaphragm and dome of the liver. The pleural nodularity seen previously has significantly improved it is difficult to visualize today. The largest 4.1 x 2.6 cm mass in the posterior costophrenic angle on the previous study is no longer visualized. There is likely still a pleural base mass on series 2, image 39 which is difficult to visualize with certainty measuring 2.5 x 1.1 cm today versus 4.0 by 2.3 cm previously. No new pleural metastases noted. The Peri fissural lesion within the anteromedial aspect of the base of the right upper lobe measures 1.3 by 1.1 cm today versus 1.6 by 1.2 cm previously. This is seen on series 6, image 65. No new nodules, masses, or infiltrates.  The left lung is clear. Musculoskeletal: Bony metastases identified. The metastasis in the T1 vertebral body measures 8 mm on series 5, image 100 today versus 7 mm previously. The lucent metastasis in T6 measures 1.7 cm today versus 1.2 cm previously, extending nearly to the superior endplate today. Sclerosis in the T4 vertebral body is more pronounced in the interval. The sternal lesion on series 5, image 95 is similar in size but more conspicuous in the interval. This is seen on series 5, image 95. Probable new lucency in T7 on series 5, image 100. a a probable metastatic lesion in the acetabulum on coronal image 88 is stable. No other metastatic disease seen in the chest. CT ABDOMEN PELVIS FINDINGS Hepatobiliary: The metastatic lesion in the liver on series 2, image 45 is smaller in the interval. The 1.7 cm lesion on series 2, image 48 measured 1.8 cm previously. The 1.5 cm lesion seen on series 2, image 49 measured 2.5 cm previously. The 0.9 cm metastasis in segment 6 on series 2, image 68  measured 7 mm previously and was less conspicuous. No new metastatic lesions in the liver. The portal vein is patent. The gallbladder is surgically absent. Pancreas: Unremarkable. No pancreatic ductal dilatation or surrounding inflammatory changes. Spleen: Normal in size without focal abnormality. Adrenals/Urinary Tract: The adrenal glands are normal. The striated nephrogram appearance of the kidneys on the previous study has resolved. No renal abnormalities noted. The ureters and bladder are normal. Stomach/Bowel: The stomach and small bowel are normal. Colonic diverticulosis is seen without diverticulitis. The remainder of the colon is normal. The appendix is normal. Vascular/Lymphatic: No significant vascular findings are present. No enlarged abdominal or pelvic lymph nodes. Reproductive: Prostate is unremarkable. Other: No abdominal wall hernia or abnormality. No abdominopelvic ascites. Musculoskeletal: The metastatic lesion in the superior endplate of L2 is similar in the interval. The metastatic lesion in the posterior aspect of L4 is similar in the interval. A subtle metastatic lesion in the posterior inferior aspect of L3 on series 5, image 101 was not seen previously. IMPRESSION: 1. There has been a mixed response to therapy. There has been mostly improvement. For example, the primary lesion in the anterior right lung is smaller. The lymphadenopathy described in the chest on the previous study has improved. The dominant mass in the right lung base on the previous study is significantly improved as are the pleural metastases. Most of the liver lesions are stable or improved. However, the segment 6 liver lesion is more conspicuous and slightly larger in the interval. Many of the bony metastases are stable. However, there appear to be new lesions in the T7 and L3 vertebral bodies. The T6 body lesion appears larger. Electronically Signed   By: Dorise Bullion III M.D   On: 11/06/2018 16:39   Ct Abdomen Pelvis  W Contrast  Result Date: 11/06/2018 CLINICAL DATA:  Lung cancer restaging EXAM: CT CHEST, ABDOMEN, AND PELVIS WITH CONTRAST TECHNIQUE: Multidetector CT imaging of the chest, abdomen and pelvis was performed following the standard protocol during bolus administration of intravenous contrast. CONTRAST:  153m OMNIPAQUE IOHEXOL 300 MG/ML  SOLN COMPARISON:  None. FINDINGS: CT CHEST FINDINGS Cardiovascular: Coronary artery calcifications are identified in the LAD. The heart is unchanged. The thoracic aorta demonstrates no aneurysm or dissection. The central pulmonary arteries are normal. Mediastinum/Nodes: The thyroid and esophagus are normal. No cervical adenopathy identified. No axillary adenopathy identified. The 2 right CP angle nodes on image 37 of series 2 of the previous  study have significantly improved. The larger of the 2 nodes measures 1.0 cm today versus 1.6 cm previously. The node anterior to the esophagus on the previous study is also significantly improved in the interval measuring 0.8 cm today versus 1.5 cm previously. This is seen on image 44, series 2. No new adenopathy in the mediastinum or hila. Lungs/Pleura: A right-sided pleural effusion is again identified, similar to a little smaller in the interval. The dominant mass at the right lung base is decreased in size. This mass measured 8.5 x 5.7 x 6.4 cm previously versus 1.9 by 1.7 by 1.4 cm on the current study. The mass continues to involve the diaphragm and dome of the liver. The pleural nodularity seen previously has significantly improved it is difficult to visualize today. The largest 4.1 x 2.6 cm mass in the posterior costophrenic angle on the previous study is no longer visualized. There is likely still a pleural base mass on series 2, image 39 which is difficult to visualize with certainty measuring 2.5 x 1.1 cm today versus 4.0 by 2.3 cm previously. No new pleural metastases noted. The Peri fissural lesion within the anteromedial aspect of  the base of the right upper lobe measures 1.3 by 1.1 cm today versus 1.6 by 1.2 cm previously. This is seen on series 6, image 65. No new nodules, masses, or infiltrates. The left lung is clear. Musculoskeletal: Bony metastases identified. The metastasis in the T1 vertebral body measures 8 mm on series 5, image 100 today versus 7 mm previously. The lucent metastasis in T6 measures 1.7 cm today versus 1.2 cm previously, extending nearly to the superior endplate today. Sclerosis in the T4 vertebral body is more pronounced in the interval. The sternal lesion on series 5, image 95 is similar in size but more conspicuous in the interval. This is seen on series 5, image 95. Probable new lucency in T7 on series 5, image 100. a a probable metastatic lesion in the acetabulum on coronal image 88 is stable. No other metastatic disease seen in the chest. CT ABDOMEN PELVIS FINDINGS Hepatobiliary: The metastatic lesion in the liver on series 2, image 45 is smaller in the interval. The 1.7 cm lesion on series 2, image 48 measured 1.8 cm previously. The 1.5 cm lesion seen on series 2, image 49 measured 2.5 cm previously. The 0.9 cm metastasis in segment 6 on series 2, image 68 measured 7 mm previously and was less conspicuous. No new metastatic lesions in the liver. The portal vein is patent. The gallbladder is surgically absent. Pancreas: Unremarkable. No pancreatic ductal dilatation or surrounding inflammatory changes. Spleen: Normal in size without focal abnormality. Adrenals/Urinary Tract: The adrenal glands are normal. The striated nephrogram appearance of the kidneys on the previous study has resolved. No renal abnormalities noted. The ureters and bladder are normal. Stomach/Bowel: The stomach and small bowel are normal. Colonic diverticulosis is seen without diverticulitis. The remainder of the colon is normal. The appendix is normal. Vascular/Lymphatic: No significant vascular findings are present. No enlarged abdominal or  pelvic lymph nodes. Reproductive: Prostate is unremarkable. Other: No abdominal wall hernia or abnormality. No abdominopelvic ascites. Musculoskeletal: The metastatic lesion in the superior endplate of L2 is similar in the interval. The metastatic lesion in the posterior aspect of L4 is similar in the interval. A subtle metastatic lesion in the posterior inferior aspect of L3 on series 5, image 101 was not seen previously. IMPRESSION: 1. There has been a mixed response to therapy. There has  been mostly improvement. For example, the primary lesion in the anterior right lung is smaller. The lymphadenopathy described in the chest on the previous study has improved. The dominant mass in the right lung base on the previous study is significantly improved as are the pleural metastases. Most of the liver lesions are stable or improved. However, the segment 6 liver lesion is more conspicuous and slightly larger in the interval. Many of the bony metastases are stable. However, there appear to be new lesions in the T7 and L3 vertebral bodies. The T6 body lesion appears larger. Electronically Signed   By: Dorise Bullion III M.D   On: 11/06/2018 16:39    ASSESSMENT AND PLAN: This is a very pleasant 48 years old white male with stage IV non-small cell lung cancer, adenocarcinoma with resistant EGFR mutation in exon 20 and PDL 1 expression of 5%. The patient completed a course of palliative radiotherapy to the cervical spine as well as left hip under the care of Dr. Sondra Come.Marland Kitchen He completed induction systemic chemotherapy with carboplatin, Alimta and Keytruda status post 3 cycles.  He has no evidence for disease progression after the induction phase of his treatment. The patient was started on maintenance treatment with Alimta and Ketruda (pembrolizumab) status post 12 cycles.  He has been tolerating this treatment well with no concerning complaints.  He had repeat CT scan of the chest, abdomen and pelvis performed recently.   I personally and independently reviewed the scan images and discussed the results with the patient and his friend today. Unfortunately his scan showed significant evidence for disease progression with enlargement of right lung mass as well as pulmonary nodules in addition to mediastinal lymphadenopathy as well as liver and bone disease. The patient was started on systemic chemotherapy with docetaxel and Cyramza status post 3 cycles.  The patient has been tolerating this treatment well except for increasing fatigue as well as the shortness of breath with exertion. He had repeat CT scan of the chest, abdomen and pelvis performed recently.  I personally and independently reviewed the scan images and discussed the result and showed the images to the patient today. His scan showed significant improvement in his disease especially in the liver and no evidence for disease progression in the skeletal metastases or the lung. I recommended for the patient to continue his current treatment with docetaxel and Cyramza and he will proceed with cycle #4 today. For the low back pain, I will give him a refill of Percocet. For the oral soreness, I will give him a refill of the doxepin mouthwash. The patient will come back for follow-up visit in 3 weeks for evaluation before the next cycle of his treatment. If he has reaccumulation of the right pleural fluid, will consider him for ultrasound-guided thoracentesis. The patient was advised to call immediately if he has any concerning symptoms in the interval. The patient voices understanding of current disease status and treatment options and is in agreement with the current care plan. All questions were answered. The patient knows to call the clinic with any problems, questions or concerns. We can certainly see the patient much sooner if necessary.  Disclaimer: This note was dictated with voice recognition software. Similar sounding words can inadvertently be transcribed  and may not be corrected upon review.

## 2018-11-15 ENCOUNTER — Telehealth: Payer: Self-pay | Admitting: Internal Medicine

## 2018-11-15 ENCOUNTER — Other Ambulatory Visit: Payer: Medicaid Other | Admitting: Internal Medicine

## 2018-11-15 ENCOUNTER — Other Ambulatory Visit: Payer: Self-pay

## 2018-11-15 ENCOUNTER — Inpatient Hospital Stay: Payer: Medicaid Other

## 2018-11-15 DIAGNOSIS — Z515 Encounter for palliative care: Secondary | ICD-10-CM

## 2018-11-15 NOTE — Progress Notes (Signed)
    [facility] Consult Note Telephone: [phone]  Fax: [phone]  PATIENT NAME: Anthony [name] DOB: October 06, 1970 MRN: [id number]  PRIMARY CARE PROVIDER:   [name], MD  REFERRING PROVIDER: Dr. [name]  RESPONSIBLE PARTY:   Self, HCPOA([name])    RECOMMENDATIONS and PLAN:  Palliaitve Care Encounter Z51.5  1.  Fatigue:  Related to advanced disease,  chemotherapy/radiation treatments and decreased nutritional intake due to oral pain. Continue antivirals and oral analgesics,  supplemental beverages TID along with meals as tolerated.  Improve hydration.  Scheduled rest periods. Labs monitored per oncology.   2.   Cancer related pain: Managed with use of Percocet prn except for R hip pain..  Consider addition of Cymbalta 30mg  qhs for R hip sciatica.   3.  Advanced Care Planning:  Reviewed Palliative vs Hospice and goals of care.  Pt. Desires to continue aggressive therapies for treatment of cancers as long as there is improvement.  Currently, his code status is full code and full scope of treatment.  HCPOAs are active in assisting patient with daily needs.  Palliaitve Care will continue to F/U with patient in aprox 4 weeks.   I spent 60 minutes providing this consultation,  from 1300 to 1400 at home. More than 50% of the time in this consultation was spent coordinating communication with patient.   HISTORY OF PRESENT ILLNESS:  Anthony [name] is a 48 y.o. year old male with multiple medical problems including stage 4 lung cancer with metastasis to liver and bone (C4 and L femur)  He also has CAD with hx of an MI and CABG in 06/2017 and cholecystectomy in 11/2017. A PET scan in 08/2017 confirmed all of the above cancers.  He has been receiving chemo and immunotherapy treatments.  He reports fatigue, wt loss and oral pain which has affected his ability to eat.  He lives alone and is independent of performing his ADLs but with  increased fatigue.  Palliative Care was asked to help address goals of care.   CODE STATUS: Full Code  PPS: 50% weak HOSPICE ELIGIBILITY/DIAGNOSIS: TBD.  Currently receiving chemo/immunotherapy  PAST MEDICAL HISTORY:  Past Medical History:  Diagnosis Date  . Acid reflux   . History of radiation therapy 09/21/17-10/04/17   C4 spine 30 Gy in 10 fractions, left femur 30 Gy in 10 fractions  . Non-small cell lung cancer ([place])   . NSTEMI (non-ST elevated myocardial infarction) ([place]) 06/13/2017   [name] 06/14/2017  . Tailbone injury since 1993   "cracked"     PHYSICAL EXAM:   General: NAD, frail and chronically ill appearing Cardiovascular: regular rate and rhythm Pulmonary: clear ant fields Abdomen: soft, nontender, + bowel sounds GU: no suprapubic tenderness Extremities: no edema, no joint deformities.  Slight limp with ambulation Skin: pale in color Neurological: A&O x3. Weakness   Gonzella Lex, NP

## 2018-11-15 NOTE — Telephone Encounter (Signed)
Phoned patient to discuss routine Palliative Care follow-up visit that was scheduled for today.  Due to the COVID-19 crisis, we are able to complete this visit via telephone evaluation or Video/audio methods.  Mr. Qazi stated that he would prefer to use the telephonic method.  An appt was rescheduled for 11/15/18 at 1430 with pt's consent.           Gonzella Lex, NP-C

## 2018-11-19 ENCOUNTER — Ambulatory Visit: Payer: Medicaid Other | Admitting: Radiation Oncology

## 2018-11-19 ENCOUNTER — Telehealth: Payer: Self-pay

## 2018-11-19 NOTE — Telephone Encounter (Signed)
Contacted pt on behalf of Dr. Sondra Come. Conveyed to pt that due to the Covid concerns and that he is following with Dr. [name] closely, Dr. Sondra Come is happy to see pt back on a PRN basis. Pt very pleased with that plan. Pt knows to contact Rad Onc if he would have any further questions/concerns. Loma Sousa, RN BSN

## 2018-11-20 ENCOUNTER — Other Ambulatory Visit: Payer: Self-pay

## 2018-11-20 ENCOUNTER — Inpatient Hospital Stay: Payer: Medicaid Other

## 2018-11-20 ENCOUNTER — Telehealth: Payer: Self-pay | Admitting: *Deleted

## 2018-11-20 DIAGNOSIS — C3491 Malignant neoplasm of unspecified part of right bronchus or lung: Secondary | ICD-10-CM

## 2018-11-20 DIAGNOSIS — Z5112 Encounter for antineoplastic immunotherapy: Secondary | ICD-10-CM | POA: Diagnosis not present

## 2018-11-20 LAB — CMP (CANCER CENTER ONLY)
ALT: 20 U/L (ref 0–44)
AST: 20 U/L (ref 15–41)
Albumin: 2.7 g/dL — ABNORMAL LOW (ref 3.5–5.0)
Alkaline Phosphatase: 111 U/L (ref 38–126)
Anion gap: 10 (ref 5–15)
BUN: 18 mg/dL (ref 6–20)
CO2: 24 mmol/L (ref 22–32)
Calcium: 9.1 mg/dL (ref 8.9–10.3)
Chloride: 99 mmol/L (ref 98–111)
Creatinine: 0.82 mg/dL (ref 0.61–1.24)
GFR, Est AFR Am: >60 mL/min (ref >60)
GFR, Estimated: >60 mL/min (ref >60)
Glucose, Bld: 110 mg/dL — ABNORMAL HIGH (ref 70–99)
Potassium: 4.2 mmol/L (ref 3.5–5.1)
Sodium: 133 mmol/L — ABNORMAL LOW (ref 135–145)
Total Bilirubin: 0.8 mg/dL (ref 0.3–1.2)
Total Protein: 6.6 g/dL (ref 6.5–8.1)

## 2018-11-20 LAB — CBC WITH DIFFERENTIAL (CANCER CENTER ONLY)
Abs Immature Granulocytes: 0.04 10*3/uL (ref 0.00–0.07)
Basophils Absolute: 0 10*3/uL (ref 0.0–0.1)
Basophils Relative: 1 %
Eosinophils Absolute: 0 10*3/uL (ref 0.0–0.5)
Eosinophils Relative: 0 %
HCT: 29.9 % — ABNORMAL LOW (ref 39.0–52.0)
Hemoglobin: 9.6 g/dL — ABNORMAL LOW (ref 13.0–17.0)
Immature Granulocytes: 1 %
Lymphocytes Relative: 43 %
Lymphs Abs: 1.4 10*3/uL (ref 0.7–4.0)
MCH: 29.4 pg (ref 26.0–34.0)
MCHC: 32.1 g/dL (ref 30.0–36.0)
MCV: 91.4 fL (ref 80.0–100.0)
Monocytes Absolute: 0.6 10*3/uL (ref 0.1–1.0)
Monocytes Relative: 19 %
Neutro Abs: 1.2 10*3/uL — ABNORMAL LOW (ref 1.7–7.7)
Neutrophils Relative %: 36 %
Platelet Count: 182 10*3/uL (ref 150–400)
RBC: 3.27 MIL/uL — ABNORMAL LOW (ref 4.22–5.81)
RDW: 18.9 % — ABNORMAL HIGH (ref 11.5–15.5)
WBC Count: 3.3 10*3/uL — ABNORMAL LOW (ref 4.0–10.5)
nRBC: 2.1 % — ABNORMAL HIGH (ref 0.0–0.2)

## 2018-11-20 NOTE — Telephone Encounter (Signed)
[facility] Work  Clinical Social Work contacted patient by phone for virtual counseling session.  CSW explored patient's feelings surrounding recent scan results and coping during [place].  CSW encouraged patient to continue to follow up with CSW as needed by phone.  [name], LCSW  Clinical Social Worker [facility]

## 2018-11-22 ENCOUNTER — Telehealth: Payer: Self-pay | Admitting: *Deleted

## 2018-11-26 ENCOUNTER — Encounter: Payer: Self-pay | Admitting: Internal Medicine

## 2018-11-26 NOTE — Progress Notes (Signed)
    [facility] Consult Note Telephone: [phone]  Fax: [phone]  PATIENT NAME: Anthony [name] DOB: 15-Dec-1970 MRN: [id number]  PRIMARY CARE PROVIDER:   [name], MD  REFERRING PROVIDER: Dr. [name]  RESPONSIBLE PARTY:   Self, HCPOA([name])    RECOMMENDATIONS and PLAN:  Palliaitve Care Encounter Z51.5  1.  Fatigue:  Unchanged.  Continue same dietary and rest periods which are also improving. Monitor   2.   Cancer related pain: Improved symptoms.  Decreased R hip pain.  Continue analgesics prn.  3.  Advanced Care Planning:  Remains full code and desires to continue chemo/immunotherapy.  His goal is to continue to attempt to improve quality of his life and spend time with his children.  Palliaitve Care will continue to F/U with patient in aprox 4 weeks.   Due to the COVID-19 crisis, this visit was performed via telephonic evaluation from my office and it was initiated and consent by this patient.  I spent 30 minutes providing this consultation,  from 1430 to 1500 at home. More than 50% of the time in this consultation was spent coordinating communication with patient.   HISTORY OF PRESENT ILLNESS:   Follow-up with Anthony [name].  He reports improvement of nutritional intake and he continues to receive therapies for the metastatic cancer.  He reports improved findings on his most recent scans.  No acute illnesses or recent hospitalizations.  Palliative Care was asked to help address goals of care.   CODE STATUS: Full Code  PPS: 50% weak HOSPICE ELIGIBILITY/DIAGNOSIS: TBD.  Currently receiving chemo/immunotherapy  PAST MEDICAL HISTORY:  Past Medical History:  Diagnosis Date  . Acid reflux   . History of radiation therapy 09/21/17-10/04/17   C4 spine 30 Gy in 10 fractions, left femur 30 Gy in 10 fractions  . Non-small cell lung cancer ([place])   . NSTEMI (non-ST elevated myocardial infarction) ([place])  06/13/2017   [name] 06/14/2017  . Tailbone injury since 1993   "cracked"     PHYSICAL EXAM:   General:Unable to visualize Pulmonary: mildly ShOB with speech Extremities: unable to visualize Neurological: A&O x3. Psych:  In good spirits  Gonzella Lex, NP-C

## 2018-11-27 ENCOUNTER — Inpatient Hospital Stay: Payer: Medicaid Other

## 2018-11-27 ENCOUNTER — Other Ambulatory Visit: Payer: Self-pay

## 2018-11-27 DIAGNOSIS — Z5112 Encounter for antineoplastic immunotherapy: Secondary | ICD-10-CM | POA: Diagnosis not present

## 2018-11-27 DIAGNOSIS — C3491 Malignant neoplasm of unspecified part of right bronchus or lung: Secondary | ICD-10-CM

## 2018-11-27 LAB — CMP (CANCER CENTER ONLY)
ALT: 12 U/L (ref 0–44)
AST: 19 U/L (ref 15–41)
Albumin: 3.1 g/dL — ABNORMAL LOW (ref 3.5–5.0)
Alkaline Phosphatase: 135 U/L — ABNORMAL HIGH (ref 38–126)
Anion gap: 10 (ref 5–15)
BUN: 11 mg/dL (ref 6–20)
CO2: 26 mmol/L (ref 22–32)
Calcium: 10 mg/dL (ref 8.9–10.3)
Chloride: 104 mmol/L (ref 98–111)
Creatinine: 0.88 mg/dL (ref 0.61–1.24)
GFR, Est AFR Am: >60 mL/min (ref >60)
GFR, Estimated: >60 mL/min (ref >60)
Glucose, Bld: 94 mg/dL (ref 70–99)
Potassium: 4.7 mmol/L (ref 3.5–5.1)
Sodium: 140 mmol/L (ref 135–145)
Total Bilirubin: 0.6 mg/dL (ref 0.3–1.2)
Total Protein: 7.3 g/dL (ref 6.5–8.1)

## 2018-11-27 LAB — CBC WITH DIFFERENTIAL (CANCER CENTER ONLY)
Abs Immature Granulocytes: 0.06 10*3/uL (ref 0.00–0.07)
Basophils Absolute: 0.1 10*3/uL (ref 0.0–0.1)
Basophils Relative: 1 %
Eosinophils Absolute: 0 10*3/uL (ref 0.0–0.5)
Eosinophils Relative: 0 %
HCT: 35.6 % — ABNORMAL LOW (ref 39.0–52.0)
Hemoglobin: 11 g/dL — ABNORMAL LOW (ref 13.0–17.0)
Immature Granulocytes: 1 %
Lymphocytes Relative: 18 %
Lymphs Abs: 2.2 10*3/uL (ref 0.7–4.0)
MCH: 28.9 pg (ref 26.0–34.0)
MCHC: 30.9 g/dL (ref 30.0–36.0)
MCV: 93.4 fL (ref 80.0–100.0)
Monocytes Absolute: 0.8 10*3/uL (ref 0.1–1.0)
Monocytes Relative: 7 %
Neutro Abs: 9 10*3/uL — ABNORMAL HIGH (ref 1.7–7.7)
Neutrophils Relative %: 73 %
Platelet Count: 255 10*3/uL (ref 150–400)
RBC: 3.81 MIL/uL — ABNORMAL LOW (ref 4.22–5.81)
RDW: 20.2 % — ABNORMAL HIGH (ref 11.5–15.5)
WBC Count: 12.2 10*3/uL — ABNORMAL HIGH (ref 4.0–10.5)
nRBC: 0 % (ref 0.0–0.2)

## 2018-12-04 ENCOUNTER — Other Ambulatory Visit: Payer: Self-pay

## 2018-12-04 ENCOUNTER — Inpatient Hospital Stay: Payer: Medicaid Other | Attending: Internal Medicine

## 2018-12-04 ENCOUNTER — Encounter: Payer: Self-pay | Admitting: Internal Medicine

## 2018-12-04 ENCOUNTER — Inpatient Hospital Stay: Payer: Medicaid Other

## 2018-12-04 ENCOUNTER — Telehealth: Payer: Self-pay | Admitting: Internal Medicine

## 2018-12-04 ENCOUNTER — Inpatient Hospital Stay (HOSPITAL_BASED_OUTPATIENT_CLINIC_OR_DEPARTMENT_OTHER): Payer: Medicaid Other | Admitting: Internal Medicine

## 2018-12-04 VITALS — BP 119/79 | HR 91 | Temp 97.9°F | Resp 18 | Ht 70.0 in | Wt 185.2 lb

## 2018-12-04 DIAGNOSIS — Z7982 Long term (current) use of aspirin: Secondary | ICD-10-CM | POA: Insufficient documentation

## 2018-12-04 DIAGNOSIS — J90 Pleural effusion, not elsewhere classified: Secondary | ICD-10-CM

## 2018-12-04 DIAGNOSIS — K5900 Constipation, unspecified: Secondary | ICD-10-CM | POA: Insufficient documentation

## 2018-12-04 DIAGNOSIS — G893 Neoplasm related pain (acute) (chronic): Secondary | ICD-10-CM | POA: Diagnosis not present

## 2018-12-04 DIAGNOSIS — Z923 Personal history of irradiation: Secondary | ICD-10-CM | POA: Diagnosis not present

## 2018-12-04 DIAGNOSIS — Z7689 Persons encountering health services in other specified circumstances: Secondary | ICD-10-CM | POA: Insufficient documentation

## 2018-12-04 DIAGNOSIS — K219 Gastro-esophageal reflux disease without esophagitis: Secondary | ICD-10-CM | POA: Diagnosis not present

## 2018-12-04 DIAGNOSIS — C782 Secondary malignant neoplasm of pleura: Secondary | ICD-10-CM

## 2018-12-04 DIAGNOSIS — C787 Secondary malignant neoplasm of liver and intrahepatic bile duct: Secondary | ICD-10-CM | POA: Insufficient documentation

## 2018-12-04 DIAGNOSIS — Z79899 Other long term (current) drug therapy: Secondary | ICD-10-CM | POA: Diagnosis not present

## 2018-12-04 DIAGNOSIS — C3491 Malignant neoplasm of unspecified part of right bronchus or lung: Secondary | ICD-10-CM

## 2018-12-04 DIAGNOSIS — I252 Old myocardial infarction: Secondary | ICD-10-CM | POA: Insufficient documentation

## 2018-12-04 DIAGNOSIS — Z5111 Encounter for antineoplastic chemotherapy: Secondary | ICD-10-CM

## 2018-12-04 DIAGNOSIS — C3411 Malignant neoplasm of upper lobe, right bronchus or lung: Secondary | ICD-10-CM | POA: Diagnosis not present

## 2018-12-04 DIAGNOSIS — K5730 Diverticulosis of large intestine without perforation or abscess without bleeding: Secondary | ICD-10-CM

## 2018-12-04 DIAGNOSIS — Z5112 Encounter for antineoplastic immunotherapy: Secondary | ICD-10-CM

## 2018-12-04 DIAGNOSIS — K1230 Oral mucositis (ulcerative), unspecified: Secondary | ICD-10-CM

## 2018-12-04 DIAGNOSIS — C7951 Secondary malignant neoplasm of bone: Secondary | ICD-10-CM | POA: Insufficient documentation

## 2018-12-04 DIAGNOSIS — C778 Secondary and unspecified malignant neoplasm of lymph nodes of multiple regions: Secondary | ICD-10-CM | POA: Insufficient documentation

## 2018-12-04 LAB — CBC WITH DIFFERENTIAL (CANCER CENTER ONLY)
Abs Immature Granulocytes: 0.06 10*3/uL (ref 0.00–0.07)
Basophils Absolute: 0 10*3/uL (ref 0.0–0.1)
Basophils Relative: 0 %
Eosinophils Absolute: 0 10*3/uL (ref 0.0–0.5)
Eosinophils Relative: 0 %
HCT: 35 % — ABNORMAL LOW (ref 39.0–52.0)
Hemoglobin: 11 g/dL — ABNORMAL LOW (ref 13.0–17.0)
Immature Granulocytes: 1 %
Lymphocytes Relative: 13 %
Lymphs Abs: 1.6 10*3/uL (ref 0.7–4.0)
MCH: 29.2 pg (ref 26.0–34.0)
MCHC: 31.4 g/dL (ref 30.0–36.0)
MCV: 92.8 fL (ref 80.0–100.0)
Monocytes Absolute: 0.8 10*3/uL (ref 0.1–1.0)
Monocytes Relative: 6 %
Neutro Abs: 9.8 10*3/uL — ABNORMAL HIGH (ref 1.7–7.7)
Neutrophils Relative %: 80 %
Platelet Count: 341 10*3/uL (ref 150–400)
RBC: 3.77 MIL/uL — ABNORMAL LOW (ref 4.22–5.81)
RDW: 19.9 % — ABNORMAL HIGH (ref 11.5–15.5)
WBC Count: 12.2 10*3/uL — ABNORMAL HIGH (ref 4.0–10.5)
nRBC: 0 % (ref 0.0–0.2)

## 2018-12-04 LAB — CMP (CANCER CENTER ONLY)
ALT: 12 U/L (ref 0–44)
AST: 18 U/L (ref 15–41)
Albumin: 3.4 g/dL — ABNORMAL LOW (ref 3.5–5.0)
Alkaline Phosphatase: 121 U/L (ref 38–126)
Anion gap: 14 (ref 5–15)
BUN: 15 mg/dL (ref 6–20)
CO2: 22 mmol/L (ref 22–32)
Calcium: 10.5 mg/dL — ABNORMAL HIGH (ref 8.9–10.3)
Chloride: 103 mmol/L (ref 98–111)
Creatinine: 0.85 mg/dL (ref 0.61–1.24)
GFR, Est AFR Am: >60 mL/min (ref >60)
GFR, Estimated: >60 mL/min (ref >60)
Glucose, Bld: 115 mg/dL — ABNORMAL HIGH (ref 70–99)
Potassium: 4.1 mmol/L (ref 3.5–5.1)
Sodium: 139 mmol/L (ref 135–145)
Total Bilirubin: 0.4 mg/dL (ref 0.3–1.2)
Total Protein: 7.7 g/dL (ref 6.5–8.1)

## 2018-12-04 LAB — TOTAL PROTEIN, URINE DIPSTICK: Protein, ur: NEGATIVE mg/dL

## 2018-12-04 MED ORDER — SODIUM CHLORIDE 0.9 % IV SOLN
75.0000 mg/m2 | Freq: Once | INTRAVENOUS | Status: AC
  Administered 2018-12-04: 160 mg via INTRAVENOUS
  Filled 2018-12-04: qty 16

## 2018-12-04 MED ORDER — PEGFILGRASTIM 6 MG/0.6ML ~~LOC~~ PSKT
PREFILLED_SYRINGE | SUBCUTANEOUS | Status: AC
  Filled 2018-12-04: qty 0.6

## 2018-12-04 MED ORDER — SODIUM CHLORIDE 0.9 % IV SOLN
9.0000 mg/kg | Freq: Once | INTRAVENOUS | Status: AC
  Administered 2018-12-04: 900 mg via INTRAVENOUS
  Filled 2018-12-04: qty 90

## 2018-12-04 MED ORDER — SODIUM CHLORIDE 0.9 % IV SOLN
Freq: Once | INTRAVENOUS | Status: AC
  Administered 2018-12-04: 12:00:00 via INTRAVENOUS
  Filled 2018-12-04: qty 250

## 2018-12-04 MED ORDER — DEXAMETHASONE SODIUM PHOSPHATE 10 MG/ML IJ SOLN
10.0000 mg | Freq: Once | INTRAMUSCULAR | Status: AC
  Administered 2018-12-04: 10 mg via INTRAVENOUS

## 2018-12-04 MED ORDER — DIPHENHYDRAMINE HCL 50 MG/ML IJ SOLN
50.0000 mg | Freq: Once | INTRAMUSCULAR | Status: AC
  Administered 2018-12-04: 50 mg via INTRAVENOUS

## 2018-12-04 MED ORDER — SODIUM CHLORIDE 0.9 % IV SOLN: 20.0000 mg | Freq: Once | INTRAVENOUS | Status: DC

## 2018-12-04 MED ORDER — DEXAMETHASONE SODIUM PHOSPHATE 10 MG/ML IJ SOLN
INTRAMUSCULAR | Status: AC
  Filled 2018-12-04: qty 1

## 2018-12-04 MED ORDER — ACETAMINOPHEN 325 MG PO TABS
650.0000 mg | ORAL_TABLET | Freq: Once | ORAL | Status: AC
  Administered 2018-12-04: 650 mg via ORAL

## 2018-12-04 MED ORDER — ACETAMINOPHEN 325 MG PO TABS
ORAL_TABLET | ORAL | Status: AC
  Filled 2018-12-04: qty 2

## 2018-12-04 MED ORDER — FAMOTIDINE IN NACL 20-0.9 MG/50ML-% IV SOLN
INTRAVENOUS | Status: AC
  Filled 2018-12-04: qty 50

## 2018-12-04 MED ORDER — OXYCODONE-ACETAMINOPHEN 5-325 MG PO TABS: 1.0000 | ORAL_TABLET | Freq: Four times a day (QID) | ORAL | 0 refills | Status: DC | PRN

## 2018-12-04 MED ORDER — DIPHENHYDRAMINE HCL 50 MG/ML IJ SOLN
INTRAMUSCULAR | Status: AC
  Filled 2018-12-04: qty 1

## 2018-12-04 MED ORDER — PEGFILGRASTIM 6 MG/0.6ML ~~LOC~~ PSKT
6.0000 mg | PREFILLED_SYRINGE | Freq: Once | SUBCUTANEOUS | Status: AC
  Administered 2018-12-04: 16:00:00 6 mg via SUBCUTANEOUS

## 2018-12-04 MED ORDER — DEXAMETHASONE 4 MG PO TABS: ORAL_TABLET | ORAL | 0 refills | Status: DC

## 2018-12-04 MED ORDER — FAMOTIDINE IN NACL 20-0.9 MG/50ML-% IV SOLN
20.0000 mg | Freq: Once | INTRAVENOUS | Status: AC
  Administered 2018-12-04: 20 mg via INTRAVENOUS

## 2018-12-04 NOTE — Telephone Encounter (Signed)
Scheduled appt per 5/5 los - pt to get an updated schedule next visit.

## 2018-12-04 NOTE — Progress Notes (Signed)
[facility] [phone]   [phone]  OFFICE PROGRESS NOTE  [name], MD [address] [place] [address]  DIAGNOSIS: Stage IVB(T1b, N2, M1c)non-small cell lung cancer, adenocarcinoma presented with right upper lobe lung nodule in addition to right hilar and mediastinal lymphadenopathy as well as metastatic liver and bone disease diagnosed in February 2019.  Biomarker Findings Microsatellite status - MS-Stable Tumor Mutational Burden - TMB-Low (4 Muts/Mb) Genomic Findings For a complete list of the genes assayed, please refer to the Appendix. EGFR exon 20 insertion ([id number]) RB1 loss exons 9-17 TP53 [id number] 7 Disease relevant genes with no reportable alterations: KRAS, ALK, BRAF, MET, RET, ERBB2, ROS1  PDL 1 expression 5%  PRIOR THERAPY:  1) Palliative radiotherapy to the metastatic bone lesions in the cervical spine and left femur under the care of Dr. Sondra Come. 2) Systemic chemotherapy with carboplatin for AUC of 5, Alimta 500 mg/M2 and Keytruda 200 mg IV every 3 weeks.  First dose October 04, 2017.  Status post 3 cycles. 3) palliative stereotactic radiotherapy to the progressive liver lesion. 4)  Maintenance treatment with Alimta 500 mg/M2 and Keytruda 200 mg IV every 3 weeks.  Status post 12 cycles.  Last dose was giving August 14, 2018 discontinued secondary to disease progression.  CURRENT THERAPY: Systemic chemotherapy with docetaxel 75 mg/M2 and Cyramza 10 mg/KG every 3 weeks with Neulasta support.  First dose September 11, 2018.  Status post 4 cycles.  INTERVAL HISTORY: Anthony [name] 48 y.o. male returns to the clinic today for follow-up visit.  The patient is feeling fine today with no concerning complaints.  He denied having any chest pain, shortness of breath, cough or hemoptysis.  He denied having any fever or chills.  He has no nausea, vomiting, diarrhea or constipation.  He continues to have mouth  sores but improved with doxepin.  He denied having any fever or chills.  He lost few pounds since his last visit.  He continues to have pain on the right hip area and he is requesting refill of oxycodone.  The patient is here today for evaluation before starting cycle #5 of his treatment.  MEDICAL HISTORY: Past Medical History:  Diagnosis Date   Acid reflux    History of radiation therapy 09/21/17-10/04/17   C4 spine 30 Gy in 10 fractions, left femur 30 Gy in 10 fractions   Non-small cell lung cancer ([facility])    NSTEMI (non-ST elevated myocardial infarction) ([facility]) 06/13/2017   [name] 06/14/2017   Tailbone injury since 1993   "cracked"    ALLERGIES:  has No Known Allergies.  MEDICATIONS:  Current Outpatient Medications  Medication Sig Dispense Refill   acetaminophen (TYLENOL) 500 MG tablet Take 500-1,000 mg by mouth daily as needed for moderate pain or headache.     acyclovir (ZOVIRAX) 200 MG/5ML suspension Take 10 mLs (400 mg total) by mouth 5 (five) times daily. (Patient not taking: Reported on 10/22/2018) 350 mL 0   aspirin EC 81 MG EC tablet Take 1 tablet (81 mg total) by mouth daily.     atorvastatin (LIPITOR) 40 MG tablet Take 1 tablet (40 mg total) by mouth daily. 90 tablet 3   cetirizine (ZYRTEC) 10 MG tablet Take 10 mg by mouth daily as needed for allergies.      cyclobenzaprine (FLEXERIL) 5 MG tablet Take 1 tablet (5 mg total) by mouth 3 (three) times daily as needed for muscle spasms. (Patient not taking: Reported  on 10/22/2018) 30 tablet 0   dexamethasone (DECADRON) 4 MG tablet 2 tablet p.o. twice daily the day before, day of and day after the chemotherapy every 3 weeks 40 tablet 0   doxepin (SINEQUAN) 10 MG/ML solution Mix 2.5 ml in 2.5 ml of water and rinse in mouth for 1 minute twice daily. Spit out solution after rinsing mouth. 120 mL 12   esomeprazole (NEXIUM) 20 MG capsule Take 20 mg by mouth every other day.     lidocaine (XYLOCAINE) 2 % solution Use as  directed 5 mLs in the mouth or throat as needed for mouth pain. Rinse and spit 200 mL 3   lisinopril (PRINIVIL,ZESTRIL) 5 MG tablet TAKE 1 TABLET BY MOUTH EVERY DAY 90 tablet 3   magic mouthwash SOLN Take 5 mLs by mouth 4 (four) times daily. Components hydrocortisone 60 mg, Nystatin 30 ml, Benadryl 12.'5mg'$ /5 ml/ 240 mL 1   metoprolol tartrate (LOPRESSOR) 25 MG tablet Take 12.5 mg by mouth 2 (two) times daily.     oxyCODONE-acetaminophen (PERCOCET/ROXICET) 5-325 MG tablet Take 1 tablet by mouth every 6 (six) hours as needed for severe pain. 30 tablet 0   prochlorperazine (COMPAZINE) 10 MG tablet Take 1 tablet (10 mg total) by mouth every 6 (six) hours as needed for nausea or vomiting. 30 tablet 1   No current facility-administered medications for this visit.     SURGICAL HISTORY:  Past Surgical History:  Procedure Laterality Date   CARDIAC CATHETERIZATION  06/14/2017   CHOLECYSTECTOMY N/A 12/29/2017   Procedure: LAPAROSCOPIC CHOLECYSTECTOMY;  Surgeon: Ralene Ok, MD;  Location: [facility];  Service: General;  Laterality: N/A;   CHOLECYSTECTOMY, LAPAROSCOPIC N/A 12/29/2017   CORONARY ARTERY BYPASS GRAFT N/A 06/19/2017   Procedure: CORONARY ARTERY BYPASS GRAFTING (CABG), OFF PUMP, times one using the left internal mammary artery to LAD;  Surgeon: Grace Isaac, MD;  Location: [facility];  Service: Open Heart Surgery;  Laterality: N/A;   ESOPHAGOGASTRODUODENOSCOPY (EGD) WITH PROPOFOL N/A 11/30/2017   Procedure: ESOPHAGOGASTRODUODENOSCOPY (EGD) WITH PROPOFOL;  Surgeon: Gatha Mayer, MD;  Location: [facility];  Service: Endoscopy;  Laterality: N/A;   LEFT HEART CATH AND CORONARY ANGIOGRAPHY N/A 06/14/2017   Procedure: LEFT HEART CATH AND CORONARY ANGIOGRAPHY;  Surgeon: Sherren Mocha, MD;  Location: [facility];  Service: Cardiovascular;  Laterality: N/A;   TEE WITHOUT CARDIOVERSION N/A 06/19/2017   Procedure: TRANSESOPHAGEAL ECHOCARDIOGRAM (TEE);  Surgeon: Grace Isaac, MD;  Location: [facility];  Service: Open Heart Surgery;  Laterality: N/A;   TONSILLECTOMY  ~ 1977    REVIEW OF SYSTEMS:  A comprehensive review of systems was negative except for: Constitutional: positive for fatigue and weight loss Musculoskeletal: positive for bone pain   PHYSICAL EXAMINATION: General appearance: alert, cooperative, fatigued and no distress Head: Normocephalic, without obvious abnormality, atraumatic Neck: no adenopathy, no JVD, supple, symmetrical, trachea midline and thyroid not enlarged, symmetric, no tenderness/mass/nodules Lymph nodes: Cervical, supraclavicular, and axillary nodes normal. Resp: clear to auscultation bilaterally Back: symmetric, no curvature. ROM normal. No CVA tenderness. Cardio: regular rate and rhythm, S1, S2 normal, no murmur, click, rub or gallop GI: soft, non-tender; bowel sounds normal; no masses,  no organomegaly Extremities: extremities normal, atraumatic, no cyanosis or edema  ECOG PERFORMANCE STATUS: 1 - Symptomatic but completely ambulatory  Blood pressure 119/79, pulse 91, temperature 97.9 F (36.6 C), temperature source Oral, resp. rate 18, height '5\' 10"'$  (1.778 m), weight 185 lb 3.2 oz (84 kg), SpO2 100 %.  LABORATORY DATA:  Lab Results  Component Value Date   WBC 12.2 (H) 12/04/2018   HGB 11.0 (L) 12/04/2018   HCT 35.0 (L) 12/04/2018   MCV 92.8 12/04/2018   PLT 341 12/04/2018      Chemistry      Component Value Date/Time   NA 140 11/27/2018 1335   NA 141 07/17/2017 1006   K 4.7 11/27/2018 1335   CL 104 11/27/2018 1335   CO2 26 11/27/2018 1335   BUN 11 11/27/2018 1335   BUN 12 07/17/2017 1006   CREATININE 0.88 11/27/2018 1335      Component Value Date/Time   CALCIUM 10.0 11/27/2018 1335   ALKPHOS 135 (H) 11/27/2018 1335   AST 19 11/27/2018 1335   ALT 12 11/27/2018 1335   BILITOT 0.6 11/27/2018 1335       RADIOGRAPHIC STUDIES: Ct Chest W Contrast  Result Date: 11/06/2018 CLINICAL DATA:  Lung cancer restaging  EXAM: CT CHEST, ABDOMEN, AND PELVIS WITH CONTRAST TECHNIQUE: Multidetector CT imaging of the chest, abdomen and pelvis was performed following the standard protocol during bolus administration of intravenous contrast. CONTRAST:  19m OMNIPAQUE IOHEXOL 300 MG/ML  SOLN COMPARISON:  None. FINDINGS: CT CHEST FINDINGS Cardiovascular: Coronary artery calcifications are identified in the LAD. The heart is unchanged. The thoracic aorta demonstrates no aneurysm or dissection. The central pulmonary arteries are normal. Mediastinum/Nodes: The thyroid and esophagus are normal. No cervical adenopathy identified. No axillary adenopathy identified. The 2 right CP angle nodes on image 37 of series 2 of the previous study have significantly improved. The larger of the 2 nodes measures 1.0 cm today versus 1.6 cm previously. The node anterior to the esophagus on the previous study is also significantly improved in the interval measuring 0.8 cm today versus 1.5 cm previously. This is seen on image 44, series 2. No new adenopathy in the mediastinum or hila. Lungs/Pleura: A right-sided pleural effusion is again identified, similar to a little smaller in the interval. The dominant mass at the right lung base is decreased in size. This mass measured 8.5 x 5.7 x 6.4 cm previously versus 1.9 by 1.7 by 1.4 cm on the current study. The mass continues to involve the diaphragm and dome of the liver. The pleural nodularity seen previously has significantly improved it is difficult to visualize today. The largest 4.1 x 2.6 cm mass in the posterior costophrenic angle on the previous study is no longer visualized. There is likely still a pleural base mass on series 2, image 39 which is difficult to visualize with certainty measuring 2.5 x 1.1 cm today versus 4.0 by 2.3 cm previously. No new pleural metastases noted. The Peri fissural lesion within the anteromedial aspect of the base of the right upper lobe measures 1.3 by 1.1 cm today versus 1.6 by  1.2 cm previously. This is seen on series 6, image 65. No new nodules, masses, or infiltrates. The left lung is clear. Musculoskeletal: Bony metastases identified. The metastasis in the T1 vertebral body measures 8 mm on series 5, image 100 today versus 7 mm previously. The lucent metastasis in T6 measures 1.7 cm today versus 1.2 cm previously, extending nearly to the superior endplate today. Sclerosis in the T4 vertebral body is more pronounced in the interval. The sternal lesion on series 5, image 95 is similar in size but more conspicuous in the interval. This is seen on series 5, image 95. Probable new lucency in T7 on series 5, image 100. a a probable metastatic lesion in the acetabulum  on coronal image 88 is stable. No other metastatic disease seen in the chest. CT ABDOMEN PELVIS FINDINGS Hepatobiliary: The metastatic lesion in the liver on series 2, image 45 is smaller in the interval. The 1.7 cm lesion on series 2, image 48 measured 1.8 cm previously. The 1.5 cm lesion seen on series 2, image 49 measured 2.5 cm previously. The 0.9 cm metastasis in segment 6 on series 2, image 68 measured 7 mm previously and was less conspicuous. No new metastatic lesions in the liver. The portal vein is patent. The gallbladder is surgically absent. Pancreas: Unremarkable. No pancreatic ductal dilatation or surrounding inflammatory changes. Spleen: Normal in size without focal abnormality. Adrenals/Urinary Tract: The adrenal glands are normal. The striated nephrogram appearance of the kidneys on the previous study has resolved. No renal abnormalities noted. The ureters and bladder are normal. Stomach/Bowel: The stomach and small bowel are normal. Colonic diverticulosis is seen without diverticulitis. The remainder of the colon is normal. The appendix is normal. Vascular/Lymphatic: No significant vascular findings are present. No enlarged abdominal or pelvic lymph nodes. Reproductive: Prostate is unremarkable. Other: No  abdominal wall hernia or abnormality. No abdominopelvic ascites. Musculoskeletal: The metastatic lesion in the superior endplate of L2 is similar in the interval. The metastatic lesion in the posterior aspect of L4 is similar in the interval. A subtle metastatic lesion in the posterior inferior aspect of L3 on series 5, image 101 was not seen previously. IMPRESSION: 1. There has been a mixed response to therapy. There has been mostly improvement. For example, the primary lesion in the anterior right lung is smaller. The lymphadenopathy described in the chest on the previous study has improved. The dominant mass in the right lung base on the previous study is significantly improved as are the pleural metastases. Most of the liver lesions are stable or improved. However, the segment 6 liver lesion is more conspicuous and slightly larger in the interval. Many of the bony metastases are stable. However, there appear to be new lesions in the T7 and L3 vertebral bodies. The T6 body lesion appears larger. Electronically Signed   By: Dorise Bullion III M.D   On: 11/06/2018 16:39   Ct Abdomen Pelvis W Contrast  Result Date: 11/06/2018 CLINICAL DATA:  Lung cancer restaging EXAM: CT CHEST, ABDOMEN, AND PELVIS WITH CONTRAST TECHNIQUE: Multidetector CT imaging of the chest, abdomen and pelvis was performed following the standard protocol during bolus administration of intravenous contrast. CONTRAST:  13m OMNIPAQUE IOHEXOL 300 MG/ML  SOLN COMPARISON:  None. FINDINGS: CT CHEST FINDINGS Cardiovascular: Coronary artery calcifications are identified in the LAD. The heart is unchanged. The thoracic aorta demonstrates no aneurysm or dissection. The central pulmonary arteries are normal. Mediastinum/Nodes: The thyroid and esophagus are normal. No cervical adenopathy identified. No axillary adenopathy identified. The 2 right CP angle nodes on image 37 of series 2 of the previous study have significantly improved. The larger of the  2 nodes measures 1.0 cm today versus 1.6 cm previously. The node anterior to the esophagus on the previous study is also significantly improved in the interval measuring 0.8 cm today versus 1.5 cm previously. This is seen on image 44, series 2. No new adenopathy in the mediastinum or hila. Lungs/Pleura: A right-sided pleural effusion is again identified, similar to a little smaller in the interval. The dominant mass at the right lung base is decreased in size. This mass measured 8.5 x 5.7 x 6.4 cm previously versus 1.9 by 1.7 by 1.4 cm on  the current study. The mass continues to involve the diaphragm and dome of the liver. The pleural nodularity seen previously has significantly improved it is difficult to visualize today. The largest 4.1 x 2.6 cm mass in the posterior costophrenic angle on the previous study is no longer visualized. There is likely still a pleural base mass on series 2, image 39 which is difficult to visualize with certainty measuring 2.5 x 1.1 cm today versus 4.0 by 2.3 cm previously. No new pleural metastases noted. The Peri fissural lesion within the anteromedial aspect of the base of the right upper lobe measures 1.3 by 1.1 cm today versus 1.6 by 1.2 cm previously. This is seen on series 6, image 65. No new nodules, masses, or infiltrates. The left lung is clear. Musculoskeletal: Bony metastases identified. The metastasis in the T1 vertebral body measures 8 mm on series 5, image 100 today versus 7 mm previously. The lucent metastasis in T6 measures 1.7 cm today versus 1.2 cm previously, extending nearly to the superior endplate today. Sclerosis in the T4 vertebral body is more pronounced in the interval. The sternal lesion on series 5, image 95 is similar in size but more conspicuous in the interval. This is seen on series 5, image 95. Probable new lucency in T7 on series 5, image 100. a a probable metastatic lesion in the acetabulum on coronal image 88 is stable. No other metastatic disease  seen in the chest. CT ABDOMEN PELVIS FINDINGS Hepatobiliary: The metastatic lesion in the liver on series 2, image 45 is smaller in the interval. The 1.7 cm lesion on series 2, image 48 measured 1.8 cm previously. The 1.5 cm lesion seen on series 2, image 49 measured 2.5 cm previously. The 0.9 cm metastasis in segment 6 on series 2, image 68 measured 7 mm previously and was less conspicuous. No new metastatic lesions in the liver. The portal vein is patent. The gallbladder is surgically absent. Pancreas: Unremarkable. No pancreatic ductal dilatation or surrounding inflammatory changes. Spleen: Normal in size without focal abnormality. Adrenals/Urinary Tract: The adrenal glands are normal. The striated nephrogram appearance of the kidneys on the previous study has resolved. No renal abnormalities noted. The ureters and bladder are normal. Stomach/Bowel: The stomach and small bowel are normal. Colonic diverticulosis is seen without diverticulitis. The remainder of the colon is normal. The appendix is normal. Vascular/Lymphatic: No significant vascular findings are present. No enlarged abdominal or pelvic lymph nodes. Reproductive: Prostate is unremarkable. Other: No abdominal wall hernia or abnormality. No abdominopelvic ascites. Musculoskeletal: The metastatic lesion in the superior endplate of L2 is similar in the interval. The metastatic lesion in the posterior aspect of L4 is similar in the interval. A subtle metastatic lesion in the posterior inferior aspect of L3 on series 5, image 101 was not seen previously. IMPRESSION: 1. There has been a mixed response to therapy. There has been mostly improvement. For example, the primary lesion in the anterior right lung is smaller. The lymphadenopathy described in the chest on the previous study has improved. The dominant mass in the right lung base on the previous study is significantly improved as are the pleural metastases. Most of the liver lesions are stable or  improved. However, the segment 6 liver lesion is more conspicuous and slightly larger in the interval. Many of the bony metastases are stable. However, there appear to be new lesions in the T7 and L3 vertebral bodies. The T6 body lesion appears larger. Electronically Signed   By: Shanon Brow  Jimmye Norman III M.D   On: 11/06/2018 16:39    ASSESSMENT AND PLAN: This is a very pleasant 48 years old white male with stage IV non-small cell lung cancer, adenocarcinoma with resistant EGFR mutation in exon 20 and PDL 1 expression of 5%. The patient completed a course of palliative radiotherapy to the cervical spine as well as left hip under the care of Dr. Sondra Come.Marland Kitchen He completed induction systemic chemotherapy with carboplatin, Alimta and Keytruda status post 3 cycles.  He has no evidence for disease progression after the induction phase of his treatment. The patient was started on maintenance treatment with Alimta and Ketruda (pembrolizumab) status post 12 cycles.  He has been tolerating this treatment well with no concerning complaints.  He had repeat CT scan of the chest, abdomen and pelvis performed recently.  I personally and independently reviewed the scan images and discussed the results with the patient and his friend today. Unfortunately his scan showed significant evidence for disease progression with enlargement of right lung mass as well as pulmonary nodules in addition to mediastinal lymphadenopathy as well as liver and bone disease. The patient was started on systemic chemotherapy with docetaxel and Cyramza status post 4 cycles.  The patient continues to tolerate this treatment well with no concerning adverse effects except for mild fatigue. I recommended for him to proceed with cycle #5 today as scheduled. For the low back pain, I will give him a refill of Percocet.  He was also advised to see his orthopedic surgeon for evaluation of the right hip pain. For the oral soreness, he will continue on doxepin  mouthwash. The patient will come back for follow-up visit in 3 weeks for evaluation before the next cycle of his treatment. He was advised to call immediately if he has any concerning symptoms in the interval. The patient voices understanding of current disease status and treatment options and is in agreement with the current care plan. All questions were answered. The patient knows to call the clinic with any problems, questions or concerns. We can certainly see the patient much sooner if necessary.  Disclaimer: This note was dictated with voice recognition software. Similar sounding words can inadvertently be transcribed and may not be corrected upon review.

## 2018-12-05 ENCOUNTER — Telehealth: Payer: Self-pay | Admitting: Medical Oncology

## 2018-12-05 ENCOUNTER — Other Ambulatory Visit: Payer: Self-pay | Admitting: Internal Medicine

## 2018-12-05 NOTE — Telephone Encounter (Signed)
Doxepin refill requested.Message to [place].

## 2018-12-06 ENCOUNTER — Inpatient Hospital Stay: Payer: Medicaid Other

## 2018-12-11 ENCOUNTER — Other Ambulatory Visit: Payer: Medicaid Other

## 2018-12-18 ENCOUNTER — Other Ambulatory Visit: Payer: Medicaid Other

## 2018-12-21 ENCOUNTER — Other Ambulatory Visit: Payer: Self-pay | Admitting: Medical Oncology

## 2018-12-21 ENCOUNTER — Telehealth: Payer: Self-pay | Admitting: *Deleted

## 2018-12-21 ENCOUNTER — Telehealth: Payer: Self-pay | Admitting: Medical Oncology

## 2018-12-21 DIAGNOSIS — K1230 Oral mucositis (ulcerative), unspecified: Secondary | ICD-10-CM

## 2018-12-21 MED ORDER — LIDOCAINE VISCOUS HCL 2 % MT SOLN: 5.0000 mL | OROMUCOSAL | 3 refills | Status: DC | PRN

## 2018-12-21 MED ORDER — LIDOCAINE VISCOUS HCL 2 % MT SOLN: 5.0000 mL | OROMUCOSAL | 3 refills | Status: AC | PRN

## 2018-12-21 NOTE — Telephone Encounter (Signed)
"  Anthony [name].  I asked for Percocet pain refill earlier today.  I do not have any more."

## 2018-12-21 NOTE — Telephone Encounter (Signed)
Dr [name] will refill percocet this afternoon.

## 2018-12-21 NOTE — Telephone Encounter (Signed)
Pain day 4-10 after chemo then subsides . Pt receives Onpro after chemo. I told him he was probably experiencing pain from the onpro and it is temporary and can take tylenol. He was relieved.  Requested lidocaine refill and it was done.

## 2018-12-22 ENCOUNTER — Other Ambulatory Visit: Payer: Self-pay | Admitting: Internal Medicine

## 2018-12-22 DIAGNOSIS — K1230 Oral mucositis (ulcerative), unspecified: Secondary | ICD-10-CM

## 2018-12-22 MED ORDER — OXYCODONE-ACETAMINOPHEN 5-325 MG PO TABS: 1.0000 | ORAL_TABLET | Freq: Four times a day (QID) | ORAL | 0 refills | Status: DC | PRN

## 2018-12-26 ENCOUNTER — Inpatient Hospital Stay: Payer: Medicaid Other

## 2018-12-26 ENCOUNTER — Encounter: Payer: Self-pay | Admitting: Internal Medicine

## 2018-12-26 ENCOUNTER — Inpatient Hospital Stay (HOSPITAL_BASED_OUTPATIENT_CLINIC_OR_DEPARTMENT_OTHER): Payer: Medicaid Other | Admitting: Internal Medicine

## 2018-12-26 ENCOUNTER — Other Ambulatory Visit: Payer: Self-pay

## 2018-12-26 VITALS — BP 113/81 | HR 94 | Temp 97.5°F | Resp 18 | Ht 70.0 in | Wt 185.8 lb

## 2018-12-26 VITALS — BP 104/73 | HR 70 | Temp 97.9°F | Resp 16

## 2018-12-26 DIAGNOSIS — Z79899 Other long term (current) drug therapy: Secondary | ICD-10-CM

## 2018-12-26 DIAGNOSIS — C782 Secondary malignant neoplasm of pleura: Secondary | ICD-10-CM

## 2018-12-26 DIAGNOSIS — C7951 Secondary malignant neoplasm of bone: Secondary | ICD-10-CM

## 2018-12-26 DIAGNOSIS — C3491 Malignant neoplasm of unspecified part of right bronchus or lung: Secondary | ICD-10-CM

## 2018-12-26 DIAGNOSIS — Z5111 Encounter for antineoplastic chemotherapy: Secondary | ICD-10-CM | POA: Diagnosis not present

## 2018-12-26 DIAGNOSIS — Z5112 Encounter for antineoplastic immunotherapy: Secondary | ICD-10-CM | POA: Diagnosis not present

## 2018-12-26 DIAGNOSIS — K5900 Constipation, unspecified: Secondary | ICD-10-CM

## 2018-12-26 DIAGNOSIS — Z7982 Long term (current) use of aspirin: Secondary | ICD-10-CM

## 2018-12-26 DIAGNOSIS — C3411 Malignant neoplasm of upper lobe, right bronchus or lung: Secondary | ICD-10-CM | POA: Diagnosis not present

## 2018-12-26 DIAGNOSIS — G893 Neoplasm related pain (acute) (chronic): Secondary | ICD-10-CM

## 2018-12-26 DIAGNOSIS — C787 Secondary malignant neoplasm of liver and intrahepatic bile duct: Secondary | ICD-10-CM

## 2018-12-26 DIAGNOSIS — C778 Secondary and unspecified malignant neoplasm of lymph nodes of multiple regions: Secondary | ICD-10-CM

## 2018-12-26 DIAGNOSIS — K5730 Diverticulosis of large intestine without perforation or abscess without bleeding: Secondary | ICD-10-CM

## 2018-12-26 DIAGNOSIS — Z7689 Persons encountering health services in other specified circumstances: Secondary | ICD-10-CM | POA: Diagnosis not present

## 2018-12-26 DIAGNOSIS — K219 Gastro-esophageal reflux disease without esophagitis: Secondary | ICD-10-CM

## 2018-12-26 DIAGNOSIS — I252 Old myocardial infarction: Secondary | ICD-10-CM

## 2018-12-26 DIAGNOSIS — J90 Pleural effusion, not elsewhere classified: Secondary | ICD-10-CM

## 2018-12-26 DIAGNOSIS — Z923 Personal history of irradiation: Secondary | ICD-10-CM

## 2018-12-26 DIAGNOSIS — C3490 Malignant neoplasm of unspecified part of unspecified bronchus or lung: Secondary | ICD-10-CM

## 2018-12-26 LAB — CMP (CANCER CENTER ONLY)
ALT: 18 U/L (ref 0–44)
AST: 17 U/L (ref 15–41)
Albumin: 3 g/dL — ABNORMAL LOW (ref 3.5–5.0)
Alkaline Phosphatase: 102 U/L (ref 38–126)
Anion gap: 11 (ref 5–15)
BUN: 13 mg/dL (ref 6–20)
CO2: 24 mmol/L (ref 22–32)
Calcium: 9.6 mg/dL (ref 8.9–10.3)
Chloride: 103 mmol/L (ref 98–111)
Creatinine: 0.94 mg/dL (ref 0.61–1.24)
GFR, Est AFR Am: >60 mL/min (ref >60)
GFR, Estimated: >60 mL/min (ref >60)
Glucose, Bld: 127 mg/dL — ABNORMAL HIGH (ref 70–99)
Potassium: 4.4 mmol/L (ref 3.5–5.1)
Sodium: 138 mmol/L (ref 135–145)
Total Bilirubin: 0.3 mg/dL (ref 0.3–1.2)
Total Protein: 7.1 g/dL (ref 6.5–8.1)

## 2018-12-26 LAB — CBC WITH DIFFERENTIAL (CANCER CENTER ONLY)
Abs Immature Granulocytes: 0.13 10*3/uL — ABNORMAL HIGH (ref 0.00–0.07)
Basophils Absolute: 0 10*3/uL (ref 0.0–0.1)
Basophils Relative: 0 %
Eosinophils Absolute: 0 10*3/uL (ref 0.0–0.5)
Eosinophils Relative: 0 %
HCT: 36.7 % — ABNORMAL LOW (ref 39.0–52.0)
Hemoglobin: 11.5 g/dL — ABNORMAL LOW (ref 13.0–17.0)
Immature Granulocytes: 1 %
Lymphocytes Relative: 9 %
Lymphs Abs: 1.3 10*3/uL (ref 0.7–4.0)
MCH: 29 pg (ref 26.0–34.0)
MCHC: 31.3 g/dL (ref 30.0–36.0)
MCV: 92.7 fL (ref 80.0–100.0)
Monocytes Absolute: 0.4 10*3/uL (ref 0.1–1.0)
Monocytes Relative: 3 %
Neutro Abs: 12.7 10*3/uL — ABNORMAL HIGH (ref 1.7–7.7)
Neutrophils Relative %: 87 %
Platelet Count: 355 10*3/uL (ref 150–400)
RBC: 3.96 MIL/uL — ABNORMAL LOW (ref 4.22–5.81)
RDW: 18.8 % — ABNORMAL HIGH (ref 11.5–15.5)
WBC Count: 14.6 10*3/uL — ABNORMAL HIGH (ref 4.0–10.5)
nRBC: 0 % (ref 0.0–0.2)

## 2018-12-26 LAB — TOTAL PROTEIN, URINE DIPSTICK: Protein, ur: <30 mg/dL

## 2018-12-26 MED ORDER — SODIUM CHLORIDE 0.9 % IV SOLN
9.0000 mg/kg | Freq: Once | INTRAVENOUS | Status: AC
  Administered 2018-12-26: 900 mg via INTRAVENOUS
  Filled 2018-12-26: qty 50

## 2018-12-26 MED ORDER — SODIUM CHLORIDE 0.9 % IV SOLN
75.0000 mg/m2 | Freq: Once | INTRAVENOUS | Status: AC
  Administered 2018-12-26: 160 mg via INTRAVENOUS
  Filled 2018-12-26: qty 16

## 2018-12-26 MED ORDER — DEXAMETHASONE SODIUM PHOSPHATE 10 MG/ML IJ SOLN
10.0000 mg | Freq: Once | INTRAMUSCULAR | Status: AC
  Administered 2018-12-26: 10 mg via INTRAVENOUS

## 2018-12-26 MED ORDER — FAMOTIDINE IN NACL 20-0.9 MG/50ML-% IV SOLN
INTRAVENOUS | Status: AC
  Filled 2018-12-26: qty 50

## 2018-12-26 MED ORDER — FAMOTIDINE IN NACL 20-0.9 MG/50ML-% IV SOLN
20.0000 mg | Freq: Once | INTRAVENOUS | Status: AC
  Administered 2018-12-26: 20 mg via INTRAVENOUS

## 2018-12-26 MED ORDER — ACETAMINOPHEN 325 MG PO TABS
650.0000 mg | ORAL_TABLET | Freq: Once | ORAL | Status: AC
  Administered 2018-12-26: 650 mg via ORAL

## 2018-12-26 MED ORDER — ACETAMINOPHEN 325 MG PO TABS
ORAL_TABLET | ORAL | Status: AC
  Filled 2018-12-26: qty 2

## 2018-12-26 MED ORDER — DEXAMETHASONE SODIUM PHOSPHATE 10 MG/ML IJ SOLN
INTRAMUSCULAR | Status: AC
  Filled 2018-12-26: qty 1

## 2018-12-26 MED ORDER — PEGFILGRASTIM 6 MG/0.6ML ~~LOC~~ PSKT
6.0000 mg | PREFILLED_SYRINGE | Freq: Once | SUBCUTANEOUS | Status: AC
  Administered 2018-12-26: 6 mg via SUBCUTANEOUS

## 2018-12-26 MED ORDER — SODIUM CHLORIDE 0.9 % IV SOLN
Freq: Once | INTRAVENOUS | Status: AC
  Administered 2018-12-26: 13:00:00 via INTRAVENOUS
  Filled 2018-12-26: qty 250

## 2018-12-26 MED ORDER — DIPHENHYDRAMINE HCL 50 MG/ML IJ SOLN
50.0000 mg | Freq: Once | INTRAMUSCULAR | Status: AC
  Administered 2018-12-26: 50 mg via INTRAVENOUS

## 2018-12-26 MED ORDER — PEGFILGRASTIM 6 MG/0.6ML ~~LOC~~ PSKT
PREFILLED_SYRINGE | SUBCUTANEOUS | Status: AC
  Filled 2018-12-26: qty 0.6

## 2018-12-26 MED ORDER — DIPHENHYDRAMINE HCL 50 MG/ML IJ SOLN
INTRAMUSCULAR | Status: AC
  Filled 2018-12-26: qty 1

## 2018-12-26 NOTE — Progress Notes (Signed)
[facility] [phone]   [phone]  OFFICE PROGRESS NOTE  [name], MD [address] [place] [address]  DIAGNOSIS: Stage IVB(T1b, N2, M1c)non-small cell lung cancer, adenocarcinoma presented with right upper lobe lung nodule in addition to right hilar and mediastinal lymphadenopathy as well as metastatic liver and bone disease diagnosed in February 2019.  Biomarker Findings Microsatellite status - MS-Stable Tumor Mutational Burden - TMB-Low (4 Muts/Mb) Genomic Findings For a complete list of the genes assayed, please refer to the Appendix. EGFR exon 20 insertion ([id number]) RB1 loss exons 9-17 [id number] 7 Disease relevant genes with no reportable alterations: KRAS, ALK, BRAF, MET, RET, ERBB2, ROS1  PDL 1 expression 5%  PRIOR THERAPY:  1) Palliative radiotherapy to the metastatic bone lesions in the cervical spine and left femur under the care of Dr. Sondra Come. 2) Systemic chemotherapy with carboplatin for AUC of 5, Alimta 500 mg/M2 and Keytruda 200 mg IV every 3 weeks.  First dose October 04, 2017.  Status post 3 cycles. 3) palliative stereotactic radiotherapy to the progressive liver lesion. 4)  Maintenance treatment with Alimta 500 mg/M2 and Keytruda 200 mg IV every 3 weeks.  Status post 12 cycles.  Last dose was giving August 14, 2018 discontinued secondary to disease progression.  CURRENT THERAPY: Systemic chemotherapy with docetaxel 75 mg/M2 and Cyramza 10 mg/KG every 3 weeks with Neulasta support.  First dose September 11, 2018.  Status post 5 cycles.  INTERVAL HISTORY: Anthony [name] 48 y.o. male returns to the clinic today for follow-up visit.  The patient is feeling fine today with no concerning complaints except for intermittent abdominal pain and constipation.  He denied having any current chest pain, shortness of breath, cough or hemoptysis.  He denied having any fever or chills.  He has no nausea,  vomiting, diarrhea.  The patient denied having any recent weight loss or night sweats.  He denied having any headache or visual changes.  He is here today for evaluation before starting cycle #6 of his treatment.   MEDICAL HISTORY: Past Medical History:  Diagnosis Date  . Acid reflux   . History of radiation therapy 09/21/17-10/04/17   C4 spine 30 Gy in 10 fractions, left femur 30 Gy in 10 fractions  . Non-small cell lung cancer ([facility])   . NSTEMI (non-ST elevated myocardial infarction) ([facility]) 06/13/2017   [name] 06/14/2017  . Tailbone injury since 1993   "cracked"    ALLERGIES:  has No Known Allergies.  MEDICATIONS:  Current Outpatient Medications  Medication Sig Dispense Refill  . acetaminophen (TYLENOL) 500 MG tablet Take 500-1,000 mg by mouth daily as needed for moderate pain or headache.    Marland Kitchen acyclovir (ZOVIRAX) 200 MG/5ML suspension Take 10 mLs (400 mg total) by mouth 5 (five) times daily. (Patient not taking: Reported on 10/22/2018) 350 mL 0  . aspirin EC 81 MG EC tablet Take 1 tablet (81 mg total) by mouth daily.    Marland Kitchen atorvastatin (LIPITOR) 40 MG tablet Take 1 tablet (40 mg total) by mouth daily. 90 tablet 3  . cetirizine (ZYRTEC) 10 MG tablet Take 10 mg by mouth daily as needed for allergies.     . cyclobenzaprine (FLEXERIL) 5 MG tablet Take 1 tablet (5 mg total) by mouth 3 (three) times daily as needed for muscle spasms. (Patient not taking: Reported on 10/22/2018) 30 tablet 0  . dexamethasone (DECADRON) 4 MG tablet 2 tablet p.o. twice daily the day  before, day of and day after the chemotherapy every 3 weeks 40 tablet 0  . doxepin (SINEQUAN) 10 MG/ML solution Mix 2.5 ml in 2.5 ml of water and rinse in mouth for 1 minute twice daily. Spit out solution after rinsing mouth. 120 mL 12  . esomeprazole (NEXIUM) 20 MG capsule Take 20 mg by mouth every other day.    . lidocaine (XYLOCAINE) 2 % solution Use as directed 5 mLs in the mouth or throat as needed for mouth pain. Rinse and spit  200 mL 3  . lisinopril (PRINIVIL,ZESTRIL) 5 MG tablet TAKE 1 TABLET BY MOUTH EVERY DAY 90 tablet 3  . magic mouthwash SOLN Take 5 mLs by mouth 4 (four) times daily. Components hydrocortisone 60 mg, Nystatin 30 ml, Benadryl 12.'5mg'$ /5 ml/ 240 mL 1  . metoprolol tartrate (LOPRESSOR) 25 MG tablet Take 12.5 mg by mouth 2 (two) times daily.    Marland Kitchen oxyCODONE-acetaminophen (PERCOCET/ROXICET) 5-325 MG tablet Take 1 tablet by mouth every 6 (six) hours as needed for severe pain. 30 tablet 0  . prochlorperazine (COMPAZINE) 10 MG tablet Take 1 tablet (10 mg total) by mouth every 6 (six) hours as needed for nausea or vomiting. 30 tablet 1   No current facility-administered medications for this visit.     SURGICAL HISTORY:  Past Surgical History:  Procedure Laterality Date  . CARDIAC CATHETERIZATION  06/14/2017  . CHOLECYSTECTOMY N/A 12/29/2017   Procedure: LAPAROSCOPIC CHOLECYSTECTOMY;  Surgeon: Ralene Ok, MD;  Location: [facility];  Service: General;  Laterality: N/A;  . CHOLECYSTECTOMY, LAPAROSCOPIC N/A 12/29/2017  . CORONARY ARTERY BYPASS GRAFT N/A 06/19/2017   Procedure: CORONARY ARTERY BYPASS GRAFTING (CABG), OFF PUMP, times one using the left internal mammary artery to LAD;  Surgeon: Grace Isaac, MD;  Location: [facility];  Service: Open Heart Surgery;  Laterality: N/A;  . ESOPHAGOGASTRODUODENOSCOPY (EGD) WITH PROPOFOL N/A 11/30/2017   Procedure: ESOPHAGOGASTRODUODENOSCOPY (EGD) WITH PROPOFOL;  Surgeon: Gatha Mayer, MD;  Location: [facility];  Service: Endoscopy;  Laterality: N/A;  . LEFT HEART CATH AND CORONARY ANGIOGRAPHY N/A 06/14/2017   Procedure: LEFT HEART CATH AND CORONARY ANGIOGRAPHY;  Surgeon: Sherren Mocha, MD;  Location: [facility];  Service: Cardiovascular;  Laterality: N/A;  . TEE WITHOUT CARDIOVERSION N/A 06/19/2017   Procedure: TRANSESOPHAGEAL ECHOCARDIOGRAM (TEE);  Surgeon: Grace Isaac, MD;  Location: [facility];  Service: Open Heart Surgery;  Laterality: N/A;  .  TONSILLECTOMY  ~ 1977    REVIEW OF SYSTEMS:  A comprehensive review of systems was negative except for: Constitutional: positive for fatigue and weight loss Gastrointestinal: positive for constipation Musculoskeletal: positive for bone pain   PHYSICAL EXAMINATION: General appearance: alert, cooperative, fatigued and no distress Head: Normocephalic, without obvious abnormality, atraumatic Neck: no adenopathy, no JVD, supple, symmetrical, trachea midline and thyroid not enlarged, symmetric, no tenderness/mass/nodules Lymph nodes: Cervical, supraclavicular, and axillary nodes normal. Resp: clear to auscultation bilaterally Back: symmetric, no curvature. ROM normal. No CVA tenderness. Cardio: regular rate and rhythm, S1, S2 normal, no murmur, click, rub or gallop GI: soft, non-tender; bowel sounds normal; no masses,  no organomegaly Extremities: extremities normal, atraumatic, no cyanosis or edema  ECOG PERFORMANCE STATUS: 1 - Symptomatic but completely ambulatory  Blood pressure 113/81, pulse 94, temperature (!) 97.5 F (36.4 C), temperature source Oral, resp. rate 18, height '5\' 10"'$  (1.778 m), weight 185 lb 12.8 oz (84.3 kg), SpO2 100 %.  LABORATORY DATA: Lab Results  Component Value Date   WBC 14.6 (H) 12/26/2018  HGB 11.5 (L) 12/26/2018   HCT 36.7 (L) 12/26/2018   MCV 92.7 12/26/2018   PLT 355 12/26/2018      Chemistry      Component Value Date/Time   NA 138 12/26/2018 1101   NA 141 07/17/2017 1006   K 4.4 12/26/2018 1101   CL 103 12/26/2018 1101   CO2 24 12/26/2018 1101   BUN 13 12/26/2018 1101   BUN 12 07/17/2017 1006   CREATININE 0.94 12/26/2018 1101      Component Value Date/Time   CALCIUM 9.6 12/26/2018 1101   ALKPHOS 102 12/26/2018 1101   AST 17 12/26/2018 1101   ALT 18 12/26/2018 1101   BILITOT 0.3 12/26/2018 1101        RADIOGRAPHIC STUDIES: No results found.  ASSESSMENT AND PLAN: This is a very pleasant 48 years old white male with stage IV non-small  cell lung cancer, adenocarcinoma with resistant EGFR mutation in exon 20 and PDL 1 expression of 5%. The patient completed a course of palliative radiotherapy to the cervical spine as well as left hip under the care of Dr. Sondra Come.Marland Kitchen He completed induction systemic chemotherapy with carboplatin, Alimta and Keytruda status post 3 cycles.  He has no evidence for disease progression after the induction phase of his treatment. The patient was started on maintenance treatment with Alimta and Ketruda (pembrolizumab) status post 12 cycles.  He has been tolerating this treatment well with no concerning complaints.  He had repeat CT scan of the chest, abdomen and pelvis performed recently.  I personally and independently reviewed the scan images and discussed the results with the patient and his friend today. Unfortunately his scan showed significant evidence for disease progression with enlargement of right lung mass as well as pulmonary nodules in addition to mediastinal lymphadenopathy as well as liver and bone disease. The patient was started on systemic chemotherapy with docetaxel and Cyramza status post 5 cycles.  He has been tolerating this treatment well except for fatigue. I recommended for the patient to proceed with cycle #6 today as planned. He will come back for follow-up visit in 3 weeks for evaluation with repeat CT scan of the chest, abdomen and pelvis for restaging of his disease. For the low back pain, he will continue his current treatment with Percocet.   For the oral soreness, he will continue on doxepin mouthwash. The patient was advised to call immediately if he has any concerning symptoms in the interval. The patient voices understanding of current disease status and treatment options and is in agreement with the current care plan. All questions were answered. The patient knows to call the clinic with any problems, questions or concerns. We can certainly see the patient much sooner if  necessary.  Disclaimer: This note was dictated with voice recognition software. Similar sounding words can inadvertently be transcribed and may not be corrected upon review.

## 2018-12-26 NOTE — Patient Instructions (Signed)
[facility] Discharge Instructions for Patients Receiving Chemotherapy  Today you received the following chemotherapy agents Taxotere and Immunotherapy Cyramza  To help prevent nausea and vomiting after your treatment, we encourage you to take your nausea medication as directed by your MD.   If you develop nausea and vomiting that is not controlled by your nausea medication, call the clinic.   BELOW ARE SYMPTOMS THAT SHOULD BE REPORTED IMMEDIATELY:  *FEVER GREATER THAN 100.5 F  *CHILLS WITH OR WITHOUT FEVER  NAUSEA AND VOMITING THAT IS NOT CONTROLLED WITH YOUR NAUSEA MEDICATION  *UNUSUAL SHORTNESS OF BREATH  *UNUSUAL BRUISING OR BLEEDING  TENDERNESS IN MOUTH AND THROAT WITH OR WITHOUT PRESENCE OF ULCERS  *URINARY PROBLEMS  *BOWEL PROBLEMS  UNUSUAL RASH Items with * indicate a potential emergency and should be followed up as soon as possible.  Feel free to call the clinic should you have any questions or concerns. The clinic phone number is [phone].  Please show the [facility] at check-in to the Emergency Department and triage nurse.  Coronavirus (COVID-19) Are you at risk?  Are you at risk for the Coronavirus (COVID-19)?  To be considered HIGH RISK for Coronavirus (COVID-19), you have to meet the following criteria:  . Traveled to [place], [place], [place], [place] or [place]; or in the [place] to [place], [place], [place], or [place]; and have fever, cough, and shortness of breath within the last 2 weeks of travel OR . Been in close contact with a person diagnosed with COVID-19 within the last 2 weeks and have fever, cough, and shortness of breath . IF YOU DO NOT MEET THESE CRITERIA, YOU ARE CONSIDERED LOW RISK FOR COVID-19.  What to do if you are HIGH RISK for COVID-19?  Marland Kitchen If you are having a medical emergency, call 911. . Seek medical care right away. Before you go to a doctor's office, urgent care or emergency department, call  ahead and tell them about your recent travel, contact with someone diagnosed with COVID-19, and your symptoms. You should receive instructions from your physician's office regarding next steps of care.  . When you arrive at healthcare provider, tell the healthcare staff immediately you have returned from visiting [place], [place], [place], [place] or [place]; or traveled in the [place] to [place], [place], [place], or [place]; in the last two weeks or you have been in close contact with a person diagnosed with COVID-19 in the last 2 weeks.   . Tell the health care staff about your symptoms: fever, cough and shortness of breath. . After you have been seen by a medical provider, you will be either: o Tested for (COVID-19) and discharged home on quarantine except to seek medical care if symptoms worsen, and asked to  - Stay home and avoid contact with others until you get your results (4-5 days)  - Avoid travel on public transportation if possible (such as bus, train, or airplane) or o Sent to the Emergency Department by EMS for evaluation, COVID-19 testing, and possible admission depending on your condition and test results.  What to do if you are LOW RISK for COVID-19?  Reduce your risk of any infection by using the same precautions used for avoiding the common cold or flu:  Marland Kitchen Wash your hands often with soap and warm water for at least 20 seconds.  If soap and water are not readily available, use an alcohol-based hand sanitizer with at least 60% alcohol.  Marland Kitchen  If coughing or sneezing, cover your mouth and nose by coughing or sneezing into the elbow areas of your shirt or coat, into a tissue or into your sleeve (not your hands). . Avoid shaking hands with others and consider head nods or verbal greetings only. . Avoid touching your eyes, nose, or mouth with unwashed hands.  . Avoid close contact with people who are sick. . Avoid places or events with large numbers of people in one location,  like concerts or sporting events. . Carefully consider travel plans you have or are making. . If you are planning any travel outside or inside the [place], visit the CDC's Travelers' Health webpage for the latest health notices. . If you have some symptoms but not all symptoms, continue to monitor at home and seek medical attention if your symptoms worsen. . If you are having a medical emergency, call 911.   [facility] / e-Visit: [email or web address]         [facility]: [facility]: [phone]                   [facility]: [phone]

## 2018-12-28 ENCOUNTER — Ambulatory Visit: Payer: Medicaid Other

## 2018-12-28 ENCOUNTER — Telehealth: Payer: Self-pay | Admitting: Internal Medicine

## 2018-12-28 NOTE — Telephone Encounter (Signed)
Scheduled appt per 5/27 los - unable to reach patient . Left message with new appt dates and time

## 2019-01-01 ENCOUNTER — Telehealth: Payer: Self-pay | Admitting: *Deleted

## 2019-01-01 ENCOUNTER — Other Ambulatory Visit: Payer: Self-pay

## 2019-01-01 ENCOUNTER — Inpatient Hospital Stay: Payer: Medicaid Other | Attending: Internal Medicine

## 2019-01-01 DIAGNOSIS — Z5112 Encounter for antineoplastic immunotherapy: Secondary | ICD-10-CM | POA: Diagnosis not present

## 2019-01-01 DIAGNOSIS — Z79899 Other long term (current) drug therapy: Secondary | ICD-10-CM | POA: Insufficient documentation

## 2019-01-01 DIAGNOSIS — R5383 Other fatigue: Secondary | ICD-10-CM | POA: Diagnosis not present

## 2019-01-01 DIAGNOSIS — C778 Secondary and unspecified malignant neoplasm of lymph nodes of multiple regions: Secondary | ICD-10-CM | POA: Diagnosis not present

## 2019-01-01 DIAGNOSIS — K219 Gastro-esophageal reflux disease without esophagitis: Secondary | ICD-10-CM | POA: Insufficient documentation

## 2019-01-01 DIAGNOSIS — Z7982 Long term (current) use of aspirin: Secondary | ICD-10-CM | POA: Insufficient documentation

## 2019-01-01 DIAGNOSIS — I252 Old myocardial infarction: Secondary | ICD-10-CM | POA: Insufficient documentation

## 2019-01-01 DIAGNOSIS — C787 Secondary malignant neoplasm of liver and intrahepatic bile duct: Secondary | ICD-10-CM | POA: Insufficient documentation

## 2019-01-01 DIAGNOSIS — R531 Weakness: Secondary | ICD-10-CM | POA: Diagnosis not present

## 2019-01-01 DIAGNOSIS — K5730 Diverticulosis of large intestine without perforation or abscess without bleeding: Secondary | ICD-10-CM | POA: Diagnosis not present

## 2019-01-01 DIAGNOSIS — C7951 Secondary malignant neoplasm of bone: Secondary | ICD-10-CM | POA: Diagnosis not present

## 2019-01-01 DIAGNOSIS — C3411 Malignant neoplasm of upper lobe, right bronchus or lung: Secondary | ICD-10-CM | POA: Diagnosis not present

## 2019-01-01 DIAGNOSIS — Z923 Personal history of irradiation: Secondary | ICD-10-CM | POA: Insufficient documentation

## 2019-01-01 DIAGNOSIS — C3491 Malignant neoplasm of unspecified part of right bronchus or lung: Secondary | ICD-10-CM

## 2019-01-01 LAB — CMP (CANCER CENTER ONLY)
ALT: 39 U/L (ref 0–44)
AST: 27 U/L (ref 15–41)
Albumin: 2.9 g/dL — ABNORMAL LOW (ref 3.5–5.0)
Alkaline Phosphatase: 103 U/L (ref 38–126)
Anion gap: 11 (ref 5–15)
BUN: 18 mg/dL (ref 6–20)
CO2: 25 mmol/L (ref 22–32)
Calcium: 9.2 mg/dL (ref 8.9–10.3)
Chloride: 99 mmol/L (ref 98–111)
Creatinine: 0.83 mg/dL (ref 0.61–1.24)
GFR, Est AFR Am: >60 mL/min (ref >60)
GFR, Estimated: >60 mL/min (ref >60)
Glucose, Bld: 102 mg/dL — ABNORMAL HIGH (ref 70–99)
Potassium: 4.2 mmol/L (ref 3.5–5.1)
Sodium: 135 mmol/L (ref 135–145)
Total Bilirubin: 0.8 mg/dL (ref 0.3–1.2)
Total Protein: 6.4 g/dL — ABNORMAL LOW (ref 6.5–8.1)

## 2019-01-01 LAB — CBC WITH DIFFERENTIAL (CANCER CENTER ONLY)
Abs Immature Granulocytes: 0.01 10*3/uL (ref 0.00–0.07)
Basophils Absolute: 0 10*3/uL (ref 0.0–0.1)
Basophils Relative: 1 %
Eosinophils Absolute: 0.1 10*3/uL (ref 0.0–0.5)
Eosinophils Relative: 5 %
HCT: 36.5 % — ABNORMAL LOW (ref 39.0–52.0)
Hemoglobin: 11.7 g/dL — ABNORMAL LOW (ref 13.0–17.0)
Immature Granulocytes: 1 %
Lymphocytes Relative: 61 %
Lymphs Abs: 0.7 10*3/uL (ref 0.7–4.0)
MCH: 29 pg (ref 26.0–34.0)
MCHC: 32.1 g/dL (ref 30.0–36.0)
MCV: 90.6 fL (ref 80.0–100.0)
Monocytes Absolute: 0.1 10*3/uL (ref 0.1–1.0)
Monocytes Relative: 9 %
Neutro Abs: 0.3 10*3/uL — CL (ref 1.7–7.7)
Neutrophils Relative %: 23 %
Platelet Count: 203 10*3/uL (ref 150–400)
RBC: 4.03 MIL/uL — ABNORMAL LOW (ref 4.22–5.81)
RDW: 18.4 % — ABNORMAL HIGH (ref 11.5–15.5)
WBC Count: 1.2 10*3/uL — ABNORMAL LOW (ref 4.0–10.5)
nRBC: 0 % (ref 0.0–0.2)

## 2019-01-01 NOTE — Telephone Encounter (Signed)
Critical Lab value reported to [facility].  Pending instruction since patient got OnPro on 12/26/2018.

## 2019-01-01 NOTE — Telephone Encounter (Signed)
Critical Lab value received.  ANC 0.3  Called patient and explained neutropenic precautions and follow up lab that is expected in one week.  Advised to report any fevers and other symptoms.

## 2019-01-03 ENCOUNTER — Other Ambulatory Visit: Payer: Self-pay | Admitting: Internal Medicine

## 2019-01-03 ENCOUNTER — Telehealth: Payer: Self-pay | Admitting: Medical Oncology

## 2019-01-03 DIAGNOSIS — K1230 Oral mucositis (ulcerative), unspecified: Secondary | ICD-10-CM

## 2019-01-03 MED FILL — OXYCODONE-ACETAMINOPHEN 5-3: 5-325 | 8 days supply | Qty: 30 | Fill #0

## 2019-01-03 NOTE — Telephone Encounter (Signed)
Refill request percocet.

## 2019-01-08 ENCOUNTER — Inpatient Hospital Stay: Payer: Medicaid Other

## 2019-01-08 ENCOUNTER — Other Ambulatory Visit: Payer: Self-pay

## 2019-01-08 DIAGNOSIS — C3491 Malignant neoplasm of unspecified part of right bronchus or lung: Secondary | ICD-10-CM

## 2019-01-08 DIAGNOSIS — Z5112 Encounter for antineoplastic immunotherapy: Secondary | ICD-10-CM | POA: Diagnosis not present

## 2019-01-08 LAB — CBC WITH DIFFERENTIAL (CANCER CENTER ONLY)
Abs Immature Granulocytes: 0.13 10*3/uL — ABNORMAL HIGH (ref 0.00–0.07)
Basophils Absolute: 0.1 10*3/uL (ref 0.0–0.1)
Basophils Relative: 1 %
Eosinophils Absolute: 0 10*3/uL (ref 0.0–0.5)
Eosinophils Relative: 0 %
HCT: 36.5 % — ABNORMAL LOW (ref 39.0–52.0)
Hemoglobin: 11.5 g/dL — ABNORMAL LOW (ref 13.0–17.0)
Immature Granulocytes: 1 %
Lymphocytes Relative: 11 %
Lymphs Abs: 1.7 10*3/uL (ref 0.7–4.0)
MCH: 28.6 pg (ref 26.0–34.0)
MCHC: 31.5 g/dL (ref 30.0–36.0)
MCV: 90.8 fL (ref 80.0–100.0)
Monocytes Absolute: 0.8 10*3/uL (ref 0.1–1.0)
Monocytes Relative: 5 %
Neutro Abs: 12.1 10*3/uL — ABNORMAL HIGH (ref 1.7–7.7)
Neutrophils Relative %: 82 %
Platelet Count: 155 10*3/uL (ref 150–400)
RBC: 4.02 MIL/uL — ABNORMAL LOW (ref 4.22–5.81)
RDW: 19.1 % — ABNORMAL HIGH (ref 11.5–15.5)
WBC Count: 14.7 10*3/uL — ABNORMAL HIGH (ref 4.0–10.5)
nRBC: 0 % (ref 0.0–0.2)

## 2019-01-08 LAB — CMP (CANCER CENTER ONLY)
ALT: 14 U/L (ref 0–44)
AST: 22 U/L (ref 15–41)
Albumin: 3 g/dL — ABNORMAL LOW (ref 3.5–5.0)
Alkaline Phosphatase: 159 U/L — ABNORMAL HIGH (ref 38–126)
Anion gap: 12 (ref 5–15)
BUN: 10 mg/dL (ref 6–20)
CO2: 25 mmol/L (ref 22–32)
Calcium: 9.5 mg/dL (ref 8.9–10.3)
Chloride: 100 mmol/L (ref 98–111)
Creatinine: 0.84 mg/dL (ref 0.61–1.24)
GFR, Est AFR Am: >60 mL/min (ref >60)
GFR, Estimated: >60 mL/min (ref >60)
Glucose, Bld: 110 mg/dL — ABNORMAL HIGH (ref 70–99)
Potassium: 4.3 mmol/L (ref 3.5–5.1)
Sodium: 137 mmol/L (ref 135–145)
Total Bilirubin: 0.5 mg/dL (ref 0.3–1.2)
Total Protein: 6.7 g/dL (ref 6.5–8.1)

## 2019-01-14 ENCOUNTER — Encounter (HOSPITAL_COMMUNITY): Payer: Self-pay

## 2019-01-14 ENCOUNTER — Other Ambulatory Visit: Payer: Self-pay

## 2019-01-14 ENCOUNTER — Ambulatory Visit (HOSPITAL_COMMUNITY)
Admission: RE | Admit: 2019-01-14 | Discharge: 2019-01-14 | Disposition: A | Discharge status: Home / Self Care | Payer: Medicaid Other | Source: Ambulatory Visit | Attending: Internal Medicine | Admitting: Internal Medicine

## 2019-01-14 DIAGNOSIS — C3490 Malignant neoplasm of unspecified part of unspecified bronchus or lung: Secondary | ICD-10-CM | POA: Insufficient documentation

## 2019-01-14 MED ORDER — SODIUM CHLORIDE (PF) 0.9 % IJ SOLN
INTRAMUSCULAR | Status: AC
  Filled 2019-01-14: qty 50

## 2019-01-14 MED ORDER — IOHEXOL 300 MG/ML  SOLN
100.0000 mL | Freq: Once | INTRAMUSCULAR | Status: AC | PRN
  Administered 2019-01-14: 100 mL via INTRAVENOUS

## 2019-01-15 ENCOUNTER — Inpatient Hospital Stay (HOSPITAL_BASED_OUTPATIENT_CLINIC_OR_DEPARTMENT_OTHER): Payer: Medicaid Other | Admitting: Internal Medicine

## 2019-01-15 ENCOUNTER — Encounter: Payer: Self-pay | Admitting: Internal Medicine

## 2019-01-15 ENCOUNTER — Inpatient Hospital Stay: Payer: Medicaid Other

## 2019-01-15 ENCOUNTER — Other Ambulatory Visit: Payer: Self-pay

## 2019-01-15 VITALS — BP 117/83 | HR 100 | Temp 97.9°F | Resp 18 | Ht 70.0 in | Wt 175.4 lb

## 2019-01-15 DIAGNOSIS — C7951 Secondary malignant neoplasm of bone: Secondary | ICD-10-CM

## 2019-01-15 DIAGNOSIS — C3411 Malignant neoplasm of upper lobe, right bronchus or lung: Secondary | ICD-10-CM | POA: Diagnosis not present

## 2019-01-15 DIAGNOSIS — Z923 Personal history of irradiation: Secondary | ICD-10-CM

## 2019-01-15 DIAGNOSIS — R531 Weakness: Secondary | ICD-10-CM

## 2019-01-15 DIAGNOSIS — C778 Secondary and unspecified malignant neoplasm of lymph nodes of multiple regions: Secondary | ICD-10-CM | POA: Diagnosis not present

## 2019-01-15 DIAGNOSIS — C787 Secondary malignant neoplasm of liver and intrahepatic bile duct: Secondary | ICD-10-CM | POA: Diagnosis not present

## 2019-01-15 DIAGNOSIS — C3491 Malignant neoplasm of unspecified part of right bronchus or lung: Secondary | ICD-10-CM

## 2019-01-15 DIAGNOSIS — K5730 Diverticulosis of large intestine without perforation or abscess without bleeding: Secondary | ICD-10-CM

## 2019-01-15 DIAGNOSIS — R5383 Other fatigue: Secondary | ICD-10-CM

## 2019-01-15 DIAGNOSIS — Z5111 Encounter for antineoplastic chemotherapy: Secondary | ICD-10-CM

## 2019-01-15 DIAGNOSIS — Z5112 Encounter for antineoplastic immunotherapy: Secondary | ICD-10-CM | POA: Diagnosis not present

## 2019-01-15 DIAGNOSIS — I252 Old myocardial infarction: Secondary | ICD-10-CM

## 2019-01-15 DIAGNOSIS — Z79899 Other long term (current) drug therapy: Secondary | ICD-10-CM

## 2019-01-15 DIAGNOSIS — K219 Gastro-esophageal reflux disease without esophagitis: Secondary | ICD-10-CM

## 2019-01-15 DIAGNOSIS — Z7982 Long term (current) use of aspirin: Secondary | ICD-10-CM

## 2019-01-15 DIAGNOSIS — K1230 Oral mucositis (ulcerative), unspecified: Secondary | ICD-10-CM

## 2019-01-15 LAB — CBC WITH DIFFERENTIAL (CANCER CENTER ONLY)
Abs Immature Granulocytes: 0.13 10*3/uL — ABNORMAL HIGH (ref 0.00–0.07)
Basophils Absolute: 0 10*3/uL (ref 0.0–0.1)
Basophils Relative: 0 %
Eosinophils Absolute: 0 10*3/uL (ref 0.0–0.5)
Eosinophils Relative: 0 %
HCT: 39 % (ref 39.0–52.0)
Hemoglobin: 11.5 g/dL — ABNORMAL LOW (ref 13.0–17.0)
Immature Granulocytes: 1 %
Lymphocytes Relative: 5 %
Lymphs Abs: 0.8 10*3/uL (ref 0.7–4.0)
MCH: 28.8 pg (ref 26.0–34.0)
MCHC: 29.5 g/dL — ABNORMAL LOW (ref 30.0–36.0)
MCV: 97.5 fL (ref 80.0–100.0)
Monocytes Absolute: 0.2 10*3/uL (ref 0.1–1.0)
Monocytes Relative: 1 %
Neutro Abs: 14 10*3/uL — ABNORMAL HIGH (ref 1.7–7.7)
Neutrophils Relative %: 93 %
Platelet Count: 266 10*3/uL (ref 150–400)
RBC: 4 MIL/uL — ABNORMAL LOW (ref 4.22–5.81)
RDW: 20.3 % — ABNORMAL HIGH (ref 11.5–15.5)
WBC Count: 15.2 10*3/uL — ABNORMAL HIGH (ref 4.0–10.5)
nRBC: 0 % (ref 0.0–0.2)

## 2019-01-15 LAB — CMP (CANCER CENTER ONLY)
ALT: 18 U/L (ref 0–44)
AST: 21 U/L (ref 15–41)
Albumin: 3.5 g/dL (ref 3.5–5.0)
Alkaline Phosphatase: 115 U/L (ref 38–126)
Anion gap: 16 — ABNORMAL HIGH (ref 5–15)
BUN: 17 mg/dL (ref 6–20)
CO2: 20 mmol/L — ABNORMAL LOW (ref 22–32)
Calcium: 10.5 mg/dL — ABNORMAL HIGH (ref 8.9–10.3)
Chloride: 103 mmol/L (ref 98–111)
Creatinine: 0.82 mg/dL (ref 0.61–1.24)
GFR, Est AFR Am: >60 mL/min (ref >60)
GFR, Estimated: >60 mL/min (ref >60)
Glucose, Bld: 116 mg/dL — ABNORMAL HIGH (ref 70–99)
Potassium: 4.1 mmol/L (ref 3.5–5.1)
Sodium: 139 mmol/L (ref 135–145)
Total Bilirubin: 0.3 mg/dL (ref 0.3–1.2)
Total Protein: 7.3 g/dL (ref 6.5–8.1)

## 2019-01-15 LAB — TOTAL PROTEIN, URINE DIPSTICK: Protein, ur: NEGATIVE mg/dL

## 2019-01-15 MED ORDER — ACETAMINOPHEN 325 MG PO TABS
ORAL_TABLET | ORAL | Status: AC
  Filled 2019-01-15: qty 2

## 2019-01-15 MED ORDER — SODIUM CHLORIDE 0.9 % IV SOLN
Freq: Once | INTRAVENOUS | Status: AC
  Administered 2019-01-15: 11:00:00 via INTRAVENOUS
  Filled 2019-01-15: qty 250

## 2019-01-15 MED ORDER — FAMOTIDINE IN NACL 20-0.9 MG/50ML-% IV SOLN
INTRAVENOUS | Status: AC
  Filled 2019-01-15: qty 50

## 2019-01-15 MED ORDER — DIPHENHYDRAMINE HCL 50 MG/ML IJ SOLN
50.0000 mg | Freq: Once | INTRAMUSCULAR | Status: AC
  Administered 2019-01-15: 50 mg via INTRAVENOUS

## 2019-01-15 MED ORDER — OXYCODONE-ACETAMINOPHEN 5-325 MG PO TABS: ORAL_TABLET | ORAL | 0 refills | Status: DC

## 2019-01-15 MED ORDER — FAMOTIDINE IN NACL 20-0.9 MG/50ML-% IV SOLN
20.0000 mg | Freq: Once | INTRAVENOUS | Status: AC
  Administered 2019-01-15: 20 mg via INTRAVENOUS

## 2019-01-15 MED ORDER — DEXAMETHASONE SODIUM PHOSPHATE 10 MG/ML IJ SOLN
10.0000 mg | Freq: Once | INTRAMUSCULAR | Status: AC
  Administered 2019-01-15: 10 mg via INTRAVENOUS

## 2019-01-15 MED ORDER — HEPARIN SOD (PORK) LOCK FLUSH 100 UNIT/ML IV SOLN
500.0000 [IU] | Freq: Once | INTRAVENOUS | Status: DC | PRN
  Filled 2019-01-15: qty 5

## 2019-01-15 MED ORDER — SODIUM CHLORIDE 0.9 % IV SOLN
65.0000 mg/m2 | Freq: Once | INTRAVENOUS | Status: AC
  Administered 2019-01-15: 130 mg via INTRAVENOUS
  Filled 2019-01-15: qty 13

## 2019-01-15 MED ORDER — PEGFILGRASTIM 6 MG/0.6ML ~~LOC~~ PSKT
PREFILLED_SYRINGE | SUBCUTANEOUS | Status: AC
  Filled 2019-01-15: qty 0.6

## 2019-01-15 MED ORDER — DIPHENHYDRAMINE HCL 50 MG/ML IJ SOLN
INTRAMUSCULAR | Status: AC
  Filled 2019-01-15: qty 1

## 2019-01-15 MED ORDER — SODIUM CHLORIDE 0.9% FLUSH
10.0000 mL | INTRAVENOUS | Status: DC | PRN
  Filled 2019-01-15: qty 10

## 2019-01-15 MED ORDER — PEGFILGRASTIM 6 MG/0.6ML ~~LOC~~ PSKT
6.0000 mg | PREFILLED_SYRINGE | Freq: Once | SUBCUTANEOUS | Status: AC
  Administered 2019-01-15: 6 mg via SUBCUTANEOUS

## 2019-01-15 MED ORDER — SODIUM CHLORIDE 0.9 % IV SOLN: 65.0000 mg/m2 | Freq: Once | INTRAVENOUS | Status: DC

## 2019-01-15 MED ORDER — DOXEPIN HCL 10 MG/ML PO CONC: ORAL | 12 refills | Status: DC

## 2019-01-15 MED ORDER — DEXAMETHASONE SODIUM PHOSPHATE 10 MG/ML IJ SOLN
INTRAMUSCULAR | Status: AC
  Filled 2019-01-15: qty 1

## 2019-01-15 MED ORDER — SODIUM CHLORIDE 0.9 % IV SOLN
10.0000 mg/kg | Freq: Once | INTRAVENOUS | Status: AC
  Administered 2019-01-15: 800 mg via INTRAVENOUS
  Filled 2019-01-15: qty 50

## 2019-01-15 MED ORDER — ACETAMINOPHEN 325 MG PO TABS
650.0000 mg | ORAL_TABLET | Freq: Once | ORAL | Status: AC
  Administered 2019-01-15: 650 mg via ORAL

## 2019-01-15 MED ORDER — SODIUM CHLORIDE 0.9 % IV SOLN: 10.0000 mg/kg | Freq: Once | INTRAVENOUS | Status: DC

## 2019-01-15 NOTE — Progress Notes (Signed)
[facility] [phone]   [phone]  OFFICE PROGRESS NOTE  [name], MD [address] [place] [address]  DIAGNOSIS: Stage IVB(T1b, N2, M1c)non-small cell lung cancer, adenocarcinoma presented with right upper lobe lung nodule in addition to right hilar and mediastinal lymphadenopathy as well as metastatic liver and bone disease diagnosed in February 2019.  Biomarker Findings Microsatellite status - MS-Stable Tumor Mutational Burden - TMB-Low (4 Muts/Mb) Genomic Findings For a complete list of the genes assayed, please refer to the Appendix. EGFR exon 20 insertion ([id number]) RB1 loss exons 9-17 TP53 [id number] 7 Disease relevant genes with no reportable alterations: KRAS, ALK, BRAF, MET, RET, ERBB2, ROS1  PDL 1 expression 5%  PRIOR THERAPY:  1) Palliative radiotherapy to the metastatic bone lesions in the cervical spine and left femur under the care of Dr. Sondra Come. 2) Systemic chemotherapy with carboplatin for AUC of 5, Alimta 500 mg/M2 and Keytruda 200 mg IV every 3 weeks.  First dose October 04, 2017.  Status post 3 cycles. 3) palliative stereotactic radiotherapy to the progressive liver lesion. 4)  Maintenance treatment with Alimta 500 mg/M2 and Keytruda 200 mg IV every 3 weeks.  Status post 12 cycles.  Last dose was giving August 14, 2018 discontinued secondary to disease progression.  CURRENT THERAPY: Systemic chemotherapy with docetaxel 75 mg/M2 and Cyramza 10 mg/KG every 3 weeks with Neulasta support.  First dose September 11, 2018.  Status post 6 cycles.  INTERVAL HISTORY: Anthony [name] 48 y.o. male returns to the clinic today for follow-up visit.  The patient is feeling fine today with no concerning complaints except for weakness in the lower extremities especially in the morning improved as the day goes.  He denied having any chest pain but has shortness of breath with exertion with no cough or  hemoptysis.  He denied having any fever or chills.  He has no nausea, vomiting, diarrhea or constipation.  He lost around 9 pounds since his last visit.  He is trying some herbal medications for his appetite.  The patient continues to tolerate his treatment with docetaxel and Cyramza fairly well.  He had repeat CT scan of the chest, abdomen pelvis performed recently and is here for evaluation and discussion of his scan results.  MEDICAL HISTORY: Past Medical History:  Diagnosis Date   Acid reflux    History of radiation therapy 09/21/17-10/04/17   C4 spine 30 Gy in 10 fractions, left femur 30 Gy in 10 fractions   Non-small cell lung cancer ([facility])    NSTEMI (non-ST elevated myocardial infarction) ([facility]) 06/13/2017   [name] 06/14/2017   Tailbone injury since 1993   "cracked"    ALLERGIES:  has No Known Allergies.  MEDICATIONS:  Current Outpatient Medications  Medication Sig Dispense Refill   acetaminophen (TYLENOL) 500 MG tablet Take 500-1,000 mg by mouth daily as needed for moderate pain or headache.     acyclovir (ZOVIRAX) 200 MG/5ML suspension Take 10 mLs (400 mg total) by mouth 5 (five) times daily. (Patient not taking: Reported on 10/22/2018) 350 mL 0   aspirin EC 81 MG EC tablet Take 1 tablet (81 mg total) by mouth daily.     atorvastatin (LIPITOR) 40 MG tablet Take 1 tablet (40 mg total) by mouth daily. 90 tablet 3   cetirizine (ZYRTEC) 10 MG tablet Take 10 mg by mouth daily as needed for allergies.      cyclobenzaprine (FLEXERIL) 5 MG tablet Take 1  tablet (5 mg total) by mouth 3 (three) times daily as needed for muscle spasms. (Patient not taking: Reported on 10/22/2018) 30 tablet 0   dexamethasone (DECADRON) 4 MG tablet 2 tablet p.o. twice daily the day before, day of and day after the chemotherapy every 3 weeks 40 tablet 0   doxepin (SINEQUAN) 10 MG/ML solution Mix 2.5 ml in 2.5 ml of water and rinse in mouth for 1 minute twice daily. Spit out solution after rinsing mouth.  120 mL 12   esomeprazole (NEXIUM) 20 MG capsule Take 20 mg by mouth every other day.     lidocaine (XYLOCAINE) 2 % solution Use as directed 5 mLs in the mouth or throat as needed for mouth pain. Rinse and spit 200 mL 3   lisinopril (PRINIVIL,ZESTRIL) 5 MG tablet TAKE 1 TABLET BY MOUTH EVERY DAY 90 tablet 3   magic mouthwash SOLN Take 5 mLs by mouth 4 (four) times daily. Components hydrocortisone 60 mg, Nystatin 30 ml, Benadryl 12.'5mg'$ /5 ml/ 240 mL 1   metoprolol tartrate (LOPRESSOR) 25 MG tablet Take 12.5 mg by mouth 2 (two) times daily.     oxyCODONE-acetaminophen (PERCOCET/ROXICET) 5-325 MG tablet TAKE 1 TABLET BY MOUTH EVERY 6 HOURS AS NEEDED FOR SEVERE PAIN. 30 tablet 0   prochlorperazine (COMPAZINE) 10 MG tablet Take 1 tablet (10 mg total) by mouth every 6 (six) hours as needed for nausea or vomiting. 30 tablet 1   No current facility-administered medications for this visit.     SURGICAL HISTORY:  Past Surgical History:  Procedure Laterality Date   CARDIAC CATHETERIZATION  06/14/2017   CHOLECYSTECTOMY N/A 12/29/2017   Procedure: LAPAROSCOPIC CHOLECYSTECTOMY;  Surgeon: Ralene Ok, MD;  Location: [facility];  Service: General;  Laterality: N/A;   CHOLECYSTECTOMY, LAPAROSCOPIC N/A 12/29/2017   CORONARY ARTERY BYPASS GRAFT N/A 06/19/2017   Procedure: CORONARY ARTERY BYPASS GRAFTING (CABG), OFF PUMP, times one using the left internal mammary artery to LAD;  Surgeon: Grace Isaac, MD;  Location: [facility];  Service: Open Heart Surgery;  Laterality: N/A;   ESOPHAGOGASTRODUODENOSCOPY (EGD) WITH PROPOFOL N/A 11/30/2017   Procedure: ESOPHAGOGASTRODUODENOSCOPY (EGD) WITH PROPOFOL;  Surgeon: Gatha Mayer, MD;  Location: [facility];  Service: Endoscopy;  Laterality: N/A;   LEFT HEART CATH AND CORONARY ANGIOGRAPHY N/A 06/14/2017   Procedure: LEFT HEART CATH AND CORONARY ANGIOGRAPHY;  Surgeon: Sherren Mocha, MD;  Location: [facility];  Service: Cardiovascular;  Laterality:  N/A;   TEE WITHOUT CARDIOVERSION N/A 06/19/2017   Procedure: TRANSESOPHAGEAL ECHOCARDIOGRAM (TEE);  Surgeon: Grace Isaac, MD;  Location: [facility];  Service: Open Heart Surgery;  Laterality: N/A;   TONSILLECTOMY  ~ 1977    REVIEW OF SYSTEMS:  Constitutional: positive for anorexia, fatigue and weight loss Eyes: negative Ears, nose, mouth, throat, and face: negative Respiratory: positive for pleurisy/chest pain Cardiovascular: negative Gastrointestinal: negative Genitourinary:negative Integument/breast: negative Hematologic/lymphatic: negative Musculoskeletal:positive for muscle weakness Neurological: negative Behavioral/Psych: negative Endocrine: negative Allergic/Immunologic: negative   PHYSICAL EXAMINATION: General appearance: alert, cooperative, fatigued and no distress Head: Normocephalic, without obvious abnormality, atraumatic Neck: no adenopathy, no JVD, supple, symmetrical, trachea midline and thyroid not enlarged, symmetric, no tenderness/mass/nodules Lymph nodes: Cervical, supraclavicular, and axillary nodes normal. Resp: clear to auscultation bilaterally Back: symmetric, no curvature. ROM normal. No CVA tenderness. Cardio: regular rate and rhythm, S1, S2 normal, no murmur, click, rub or gallop GI: soft, non-tender; bowel sounds normal; no masses,  no organomegaly Extremities: extremities normal, atraumatic, no cyanosis or edema Neurologic: Alert and oriented X  3, normal strength and tone. Normal symmetric reflexes. Normal coordination and gait  ECOG PERFORMANCE STATUS: 1 - Symptomatic but completely ambulatory  Blood pressure 117/83, pulse 100, temperature 97.9 F (36.6 C), temperature source Oral, resp. rate 18, height '5\' 10"'$  (1.778 m), weight 175 lb 6.4 oz (79.6 kg), SpO2 100 %.  LABORATORY DATA: Lab Results  Component Value Date   WBC 15.2 (H) 01/15/2019   HGB 11.5 (L) 01/15/2019   HCT 39.0 01/15/2019   MCV 97.5 01/15/2019   PLT 266 01/15/2019       Chemistry      Component Value Date/Time   NA 139 01/15/2019 0934   NA 141 07/17/2017 1006   K 4.1 01/15/2019 0934   CL 103 01/15/2019 0934   CO2 20 (L) 01/15/2019 0934   BUN 17 01/15/2019 0934   BUN 12 07/17/2017 1006   CREATININE 0.82 01/15/2019 0934      Component Value Date/Time   CALCIUM 10.5 (H) 01/15/2019 0934   ALKPHOS 115 01/15/2019 0934   AST 21 01/15/2019 0934   ALT 18 01/15/2019 0934   BILITOT 0.3 01/15/2019 0934        RADIOGRAPHIC STUDIES: Ct Chest W Contrast  Result Date: 01/14/2019 CLINICAL DATA:  Patient with history of right lung cancer. Follow-up exam. EXAM: CT CHEST, ABDOMEN, AND PELVIS WITH CONTRAST TECHNIQUE: Multidetector CT imaging of the chest, abdomen and pelvis was performed following the standard protocol during bolus administration of intravenous contrast. CONTRAST:  175m OMNIPAQUE IOHEXOL 300 MG/ML  SOLN COMPARISON:  CT CAP 11/06/2018 FINDINGS: CT CHEST FINDINGS Cardiovascular: Normal heart size. Trace fluid superior pericardial recess. Coronary arterial and thoracic aortic vascular calcifications. Mediastinum/Nodes: No enlarged axillary, mediastinal or hilar lymphadenopathy. Normal appearance of the esophagus. Lungs/Pleura: Similar-appearing large right pleural effusion. Small left pleural effusion, new from prior. Central airways are patent. Subpleural atelectasis within the right lower lobe. No large area of consolidation. No pneumothorax. Previously described mass within the right lower hemithorax is nearly resolved. Stable appearing right upper lobe nodule measuring 1.2 x 1.2 cm (image 57; series 4). Musculoskeletal: Similar-appearing osseous metastasis including an 8 mm lytic lesion within the T1 vertebral body, patchy sclerotic lesion in the T4 vertebral body and 1.6 cm lytic lesion in the T6 vertebral body. Similar-appearing lucent lesion within the sternum (image 96; series 6). CT ABDOMEN PELVIS FINDINGS Hepatobiliary: Continued interval decrease in  size of hepatic lesions including a 5 mm right hepatic lobe lesion (image 62; series 2), previously 9 mm and an 11 x 12 mm medial left hepatic lobe lesion (image 46; series 2), previously 17 x 12 mm. No intrahepatic or extrahepatic biliary ductal dilatation. Pancreas: Unremarkable Spleen: Unremarkable Adrenals/Urinary Tract: Normal adrenal glands. Kidneys enhance symmetrically with contrast. No hydronephrosis. Urinary bladder is unremarkable. Stomach/Bowel: Sigmoid colonic diverticulosis. No CT evidence for acute diverticulitis. No evidence for small bowel obstruction. Moderate volume free fluid in the pelvis. Normal morphology of the stomach. No free intraperitoneal air. Vascular/Lymphatic: Normal caliber abdominal aorta. No retroperitoneal lymphadenopathy. Reproductive: Prostate is unremarkable. Other: None. Musculoskeletal: Similar-appearing lytic metastasis involving the L2 vertebral body. Slight interval increase in size of lytic metastasis involving the L4 vertebral body. IMPRESSION: 1. Continued interval decrease in size of right lower lobe lesion and hepatic metastasis. 2. Overall similar-appearing osseous metastatic disease. Slight interval increase in size lytic metastasis involving the L4 vertebral body. 3. New small left pleural effusion. 4. New small amount of fluid in the pelvis and right lower abdominal fat stranding. Electronically Signed  By: Lovey Newcomer M.D.   On: 01/14/2019 15:15   Ct Abdomen Pelvis W Contrast  Result Date: 01/14/2019 CLINICAL DATA:  Patient with history of right lung cancer. Follow-up exam. EXAM: CT CHEST, ABDOMEN, AND PELVIS WITH CONTRAST TECHNIQUE: Multidetector CT imaging of the chest, abdomen and pelvis was performed following the standard protocol during bolus administration of intravenous contrast. CONTRAST:  133m OMNIPAQUE IOHEXOL 300 MG/ML  SOLN COMPARISON:  CT CAP 11/06/2018 FINDINGS: CT CHEST FINDINGS Cardiovascular: Normal heart size. Trace fluid superior  pericardial recess. Coronary arterial and thoracic aortic vascular calcifications. Mediastinum/Nodes: No enlarged axillary, mediastinal or hilar lymphadenopathy. Normal appearance of the esophagus. Lungs/Pleura: Similar-appearing large right pleural effusion. Small left pleural effusion, new from prior. Central airways are patent. Subpleural atelectasis within the right lower lobe. No large area of consolidation. No pneumothorax. Previously described mass within the right lower hemithorax is nearly resolved. Stable appearing right upper lobe nodule measuring 1.2 x 1.2 cm (image 57; series 4). Musculoskeletal: Similar-appearing osseous metastasis including an 8 mm lytic lesion within the T1 vertebral body, patchy sclerotic lesion in the T4 vertebral body and 1.6 cm lytic lesion in the T6 vertebral body. Similar-appearing lucent lesion within the sternum (image 96; series 6). CT ABDOMEN PELVIS FINDINGS Hepatobiliary: Continued interval decrease in size of hepatic lesions including a 5 mm right hepatic lobe lesion (image 62; series 2), previously 9 mm and an 11 x 12 mm medial left hepatic lobe lesion (image 46; series 2), previously 17 x 12 mm. No intrahepatic or extrahepatic biliary ductal dilatation. Pancreas: Unremarkable Spleen: Unremarkable Adrenals/Urinary Tract: Normal adrenal glands. Kidneys enhance symmetrically with contrast. No hydronephrosis. Urinary bladder is unremarkable. Stomach/Bowel: Sigmoid colonic diverticulosis. No CT evidence for acute diverticulitis. No evidence for small bowel obstruction. Moderate volume free fluid in the pelvis. Normal morphology of the stomach. No free intraperitoneal air. Vascular/Lymphatic: Normal caliber abdominal aorta. No retroperitoneal lymphadenopathy. Reproductive: Prostate is unremarkable. Other: None. Musculoskeletal: Similar-appearing lytic metastasis involving the L2 vertebral body. Slight interval increase in size of lytic metastasis involving the L4 vertebral  body. IMPRESSION: 1. Continued interval decrease in size of right lower lobe lesion and hepatic metastasis. 2. Overall similar-appearing osseous metastatic disease. Slight interval increase in size lytic metastasis involving the L4 vertebral body. 3. New small left pleural effusion. 4. New small amount of fluid in the pelvis and right lower abdominal fat stranding. Electronically Signed   By: DLovey NewcomerM.D.   On: 01/14/2019 15:15    ASSESSMENT AND PLAN: This is a very pleasant 48years old white male with stage IV non-small cell lung cancer, adenocarcinoma with resistant EGFR mutation in exon 20 and PDL 1 expression of 5%. The patient completed a course of palliative radiotherapy to the cervical spine as well as left hip under the care of Dr. KSondra Come.Marland KitchenHe completed induction systemic chemotherapy with carboplatin, Alimta and Keytruda status post 3 cycles.  He has no evidence for disease progression after the induction phase of his treatment. The patient was started on maintenance treatment with Alimta and Ketruda (pembrolizumab) status post 12 cycles.  He has been tolerating this treatment well with no concerning complaints.  He had repeat CT scan of the chest, abdomen and pelvis performed recently.  I personally and independently reviewed the scan images and discussed the results with the patient and his friend today. Unfortunately his scan showed significant evidence for disease progression with enlargement of right lung mass as well as pulmonary nodules in addition to mediastinal lymphadenopathy as  well as liver and bone disease. The patient was started on systemic chemotherapy with docetaxel and Cyramza status post 6 cycles.  He continues to tolerate this treatment fairly well with no concerning complaints except for the fatigue and generalized weakness. He had repeat CT scan of the chest, abdomen pelvis performed recently.  I personally and independently reviewed the scans and discussed the results  with the patient today. His a scan showed further improvement of his disease. I recommended for the patient to continue his current treatment with docetaxel and Cyramza and he will proceed with cycle #7 today. For pain management I will give the patient a refill of oxycodone. He will come back for follow-up visit in 3 weeks for evaluation before starting cycle #8. The patient was advised to call immediately if he has any concerning symptoms in the interval. The patient voices understanding of current disease status and treatment options and is in agreement with the current care plan. All questions were answered. The patient knows to call the clinic with any problems, questions or concerns. We can certainly see the patient much sooner if necessary.  Disclaimer: This note was dictated with voice recognition software. Similar sounding words can inadvertently be transcribed and may not be corrected upon review.

## 2019-01-15 NOTE — Patient Instructions (Signed)
[facility] Discharge Instructions for Patients Receiving Chemotherapy  Today you received the following chemotherapy agents Taxotere and Immunotherapy Cyramza  To help prevent nausea and vomiting after your treatment, we encourage you to take your nausea medication as directed by your MD.   If you develop nausea and vomiting that is not controlled by your nausea medication, call the clinic.   BELOW ARE SYMPTOMS THAT SHOULD BE REPORTED IMMEDIATELY:  *FEVER GREATER THAN 100.5 F  *CHILLS WITH OR WITHOUT FEVER  NAUSEA AND VOMITING THAT IS NOT CONTROLLED WITH YOUR NAUSEA MEDICATION  *UNUSUAL SHORTNESS OF BREATH  *UNUSUAL BRUISING OR BLEEDING  TENDERNESS IN MOUTH AND THROAT WITH OR WITHOUT PRESENCE OF ULCERS  *URINARY PROBLEMS  *BOWEL PROBLEMS  UNUSUAL RASH Items with * indicate a potential emergency and should be followed up as soon as possible.  Feel free to call the clinic should you have any questions or concerns. The clinic phone number is [phone].  Please show the [facility] at check-in to the Emergency Department and triage nurse.  Coronavirus (COVID-19) Are you at risk?  Are you at risk for the Coronavirus (COVID-19)?  To be considered HIGH RISK for Coronavirus (COVID-19), you have to meet the following criteria:  . Traveled to [place], [place], [place], [place] or [place]; or in the [place] to [place], [place], [place], or [place]; and have fever, cough, and shortness of breath within the last 2 weeks of travel OR . Been in close contact with a person diagnosed with COVID-19 within the last 2 weeks and have fever, cough, and shortness of breath . IF YOU DO NOT MEET THESE CRITERIA, YOU ARE CONSIDERED LOW RISK FOR COVID-19.  What to do if you are HIGH RISK for COVID-19?  Marland Kitchen If you are having a medical emergency, call 911. . Seek medical care right away. Before you go to a doctor's office, urgent care or emergency department, call  ahead and tell them about your recent travel, contact with someone diagnosed with COVID-19, and your symptoms. You should receive instructions from your physician's office regarding next steps of care.  . When you arrive at healthcare provider, tell the healthcare staff immediately you have returned from visiting [place], [place], [place], [place] or [place]; or traveled in the [place] to [place], [place], [place], or [place]; in the last two weeks or you have been in close contact with a person diagnosed with COVID-19 in the last 2 weeks.   . Tell the health care staff about your symptoms: fever, cough and shortness of breath. . After you have been seen by a medical provider, you will be either: o Tested for (COVID-19) and discharged home on quarantine except to seek medical care if symptoms worsen, and asked to  - Stay home and avoid contact with others until you get your results (4-5 days)  - Avoid travel on public transportation if possible (such as bus, train, or airplane) or o Sent to the Emergency Department by EMS for evaluation, COVID-19 testing, and possible admission depending on your condition and test results.  What to do if you are LOW RISK for COVID-19?  Reduce your risk of any infection by using the same precautions used for avoiding the common cold or flu:  Marland Kitchen Wash your hands often with soap and warm water for at least 20 seconds.  If soap and water are not readily available, use an alcohol-based hand sanitizer with at least 60% alcohol.  Marland Kitchen  If coughing or sneezing, cover your mouth and nose by coughing or sneezing into the elbow areas of your shirt or coat, into a tissue or into your sleeve (not your hands). . Avoid shaking hands with others and consider head nods or verbal greetings only. . Avoid touching your eyes, nose, or mouth with unwashed hands.  . Avoid close contact with people who are sick. . Avoid places or events with large numbers of people in one location,  like concerts or sporting events. . Carefully consider travel plans you have or are making. . If you are planning any travel outside or inside the [place], visit the CDC's Travelers' Health webpage for the latest health notices. . If you have some symptoms but not all symptoms, continue to monitor at home and seek medical attention if your symptoms worsen. . If you are having a medical emergency, call 911.   [facility] / e-Visit: [email or web address]         [facility]: [facility]: [phone]                   [facility]: [phone]

## 2019-01-15 NOTE — Progress Notes (Signed)
SATURATION QUALIFICATIONS: (This note is used to comply with regulatory documentation for home oxygen)  Patient Saturations on Room Air at Rest = 99 Patient Saturations on Room Air while Ambulating = 93%  Patient Saturations on2Liters of oxygen while Ambulating =99  Please briefly explain why patient needs home oxygen:does not qualify

## 2019-01-15 NOTE — Progress Notes (Signed)
Per Dr. [name], adjust cyramza and docetaxel for further weight loss. Docetaxel dose was decreased today to 65 mg/m2.   Orders updated.   Demetrius Charity, PharmD, [facility] Pharmacist Pharmacy Phone: [phone] 01/15/2019

## 2019-01-17 ENCOUNTER — Other Ambulatory Visit: Payer: Self-pay | Admitting: Medical Oncology

## 2019-01-17 ENCOUNTER — Telehealth: Payer: Self-pay | Admitting: Internal Medicine

## 2019-01-17 ENCOUNTER — Telehealth: Payer: Self-pay | Admitting: Medical Oncology

## 2019-01-17 DIAGNOSIS — B029 Zoster without complications: Secondary | ICD-10-CM

## 2019-01-17 MED ORDER — VALACYCLOVIR HCL 500 MG PO TABS: 500.0000 mg | ORAL_TABLET | Freq: Two times a day (BID) | ORAL | 0 refills | Status: DC

## 2019-01-17 NOTE — Telephone Encounter (Signed)
Scheduled per los. Mailed printout  °

## 2019-01-17 NOTE — Telephone Encounter (Signed)
Ras left buttock /back still present and not really any better. He describes it as a tingling itch. Denies blister appearance or pain. He will send picture to phone.

## 2019-01-18 ENCOUNTER — Other Ambulatory Visit: Payer: Self-pay | Admitting: Interventional Cardiology

## 2019-01-22 ENCOUNTER — Telehealth: Payer: Self-pay | Admitting: *Deleted

## 2019-01-22 ENCOUNTER — Inpatient Hospital Stay: Payer: Medicaid Other

## 2019-01-22 ENCOUNTER — Other Ambulatory Visit: Payer: Self-pay

## 2019-01-22 DIAGNOSIS — C3491 Malignant neoplasm of unspecified part of right bronchus or lung: Secondary | ICD-10-CM

## 2019-01-22 DIAGNOSIS — Z5112 Encounter for antineoplastic immunotherapy: Secondary | ICD-10-CM | POA: Diagnosis not present

## 2019-01-22 LAB — CMP (CANCER CENTER ONLY)
ALT: 33 U/L (ref 0–44)
AST: 34 U/L (ref 15–41)
Albumin: 3.1 g/dL — ABNORMAL LOW (ref 3.5–5.0)
Alkaline Phosphatase: 91 U/L (ref 38–126)
Anion gap: 9 (ref 5–15)
BUN: 33 mg/dL — ABNORMAL HIGH (ref 6–20)
CO2: 24 mmol/L (ref 22–32)
Calcium: 8.8 mg/dL — ABNORMAL LOW (ref 8.9–10.3)
Chloride: 98 mmol/L (ref 98–111)
Creatinine: 0.7 mg/dL (ref 0.61–1.24)
GFR, Est AFR Am: >60 mL/min (ref >60)
GFR, Estimated: >60 mL/min (ref >60)
Glucose, Bld: 84 mg/dL (ref 70–99)
Potassium: 4 mmol/L (ref 3.5–5.1)
Sodium: 131 mmol/L — ABNORMAL LOW (ref 135–145)
Total Bilirubin: 0.8 mg/dL (ref 0.3–1.2)
Total Protein: 6.1 g/dL — ABNORMAL LOW (ref 6.5–8.1)

## 2019-01-22 LAB — CBC WITH DIFFERENTIAL (CANCER CENTER ONLY)
Abs Immature Granulocytes: 0.1 10*3/uL — ABNORMAL HIGH (ref 0.00–0.07)
Band Neutrophils: 6 %
Basophils Absolute: 0 10*3/uL (ref 0.0–0.1)
Basophils Relative: 1 %
Eosinophils Absolute: 0 10*3/uL (ref 0.0–0.5)
Eosinophils Relative: 1 %
HCT: 31.3 % — ABNORMAL LOW (ref 39.0–52.0)
Hemoglobin: 10.2 g/dL — ABNORMAL LOW (ref 13.0–17.0)
Lymphocytes Relative: 33 %
Lymphs Abs: 0.6 10*3/uL — ABNORMAL LOW (ref 0.7–4.0)
MCH: 29.5 pg (ref 26.0–34.0)
MCHC: 32.6 g/dL (ref 30.0–36.0)
MCV: 90.5 fL (ref 80.0–100.0)
Metamyelocytes Relative: 4 %
Monocytes Absolute: 0.3 10*3/uL (ref 0.1–1.0)
Monocytes Relative: 18 %
Myelocytes: 1 %
Neutro Abs: 0.8 10*3/uL — ABNORMAL LOW (ref 1.7–17.7)
Neutrophils Relative %: 36 %
Platelet Count: 136 10*3/uL — ABNORMAL LOW (ref 150–400)
RBC: 3.46 MIL/uL — ABNORMAL LOW (ref 4.22–5.81)
RDW: 19.6 % — ABNORMAL HIGH (ref 11.5–15.5)
WBC Count: 1.9 10*3/uL — ABNORMAL LOW (ref 4.0–10.5)
nRBC: 1.6 % — ABNORMAL HIGH (ref 0.0–0.2)

## 2019-01-22 NOTE — Telephone Encounter (Signed)
Per Cassie Heilingoetter, PA attempted to call patient to review neutropenic precautions.  Patient did no answer.  Left brief message to return call so that those precautions can be reviewed again until the effects of his Onpro kick in.    Precautions have been discussed before but need to advise of lab values and discretion that needs to be taken.

## 2019-01-22 NOTE — Telephone Encounter (Signed)
Spoke with patient to review labs.

## 2019-01-25 ENCOUNTER — Other Ambulatory Visit: Payer: Self-pay | Admitting: Physician Assistant

## 2019-01-25 ENCOUNTER — Telehealth: Payer: Self-pay | Admitting: Medical Oncology

## 2019-01-25 DIAGNOSIS — K1230 Oral mucositis (ulcerative), unspecified: Secondary | ICD-10-CM

## 2019-01-25 DIAGNOSIS — G893 Neoplasm related pain (acute) (chronic): Secondary | ICD-10-CM

## 2019-01-25 MED ORDER — OXYCODONE-ACETAMINOPHEN 5-325 MG PO TABS: ORAL_TABLET | ORAL | 0 refills | Status: DC

## 2019-01-25 NOTE — Telephone Encounter (Signed)
Refill request oxycodone .   Shingles - the pain "finally hit me"  . Rash improved and is dry ,but still itching.  Does he need to refill Valtrex?

## 2019-01-25 NOTE — Telephone Encounter (Signed)
If the rash disappears, he needs gabapentin not Valtrex.

## 2019-01-29 ENCOUNTER — Other Ambulatory Visit: Payer: Self-pay

## 2019-01-29 ENCOUNTER — Other Ambulatory Visit: Payer: Self-pay | Admitting: *Deleted

## 2019-01-29 ENCOUNTER — Inpatient Hospital Stay: Payer: Medicaid Other

## 2019-01-29 DIAGNOSIS — C3491 Malignant neoplasm of unspecified part of right bronchus or lung: Secondary | ICD-10-CM

## 2019-01-29 DIAGNOSIS — Z5112 Encounter for antineoplastic immunotherapy: Secondary | ICD-10-CM | POA: Diagnosis not present

## 2019-01-29 LAB — CMP (CANCER CENTER ONLY)
ALT: 18 U/L (ref 0–44)
AST: 28 U/L (ref 15–41)
Albumin: 2.9 g/dL — ABNORMAL LOW (ref 3.5–5.0)
Alkaline Phosphatase: 123 U/L (ref 38–126)
Anion gap: 9 (ref 5–15)
BUN: 19 mg/dL (ref 6–20)
CO2: 25 mmol/L (ref 22–32)
Calcium: 9.2 mg/dL (ref 8.9–10.3)
Chloride: 102 mmol/L (ref 98–111)
Creatinine: 0.76 mg/dL (ref 0.61–1.24)
GFR, Est AFR Am: >60 mL/min (ref >60)
GFR, Estimated: >60 mL/min (ref >60)
Glucose, Bld: 83 mg/dL (ref 70–99)
Potassium: 4.3 mmol/L (ref 3.5–5.1)
Sodium: 136 mmol/L (ref 135–145)
Total Bilirubin: 0.5 mg/dL (ref 0.3–1.2)
Total Protein: 6.5 g/dL (ref 6.5–8.1)

## 2019-01-29 LAB — CBC WITH DIFFERENTIAL (CANCER CENTER ONLY)
Abs Immature Granulocytes: 0.07 10*3/uL (ref 0.00–0.07)
Basophils Absolute: 0 10*3/uL (ref 0.0–0.1)
Basophils Relative: 0 %
Eosinophils Absolute: 0 10*3/uL (ref 0.0–0.5)
Eosinophils Relative: 0 %
HCT: 32.8 % — ABNORMAL LOW (ref 39.0–52.0)
Hemoglobin: 10.3 g/dL — ABNORMAL LOW (ref 13.0–17.0)
Immature Granulocytes: 1 %
Lymphocytes Relative: 13 %
Lymphs Abs: 1.5 10*3/uL (ref 0.7–4.0)
MCH: 29.8 pg (ref 26.0–34.0)
MCHC: 31.4 g/dL (ref 30.0–36.0)
MCV: 94.8 fL (ref 80.0–100.0)
Monocytes Absolute: 0.8 10*3/uL (ref 0.1–1.0)
Monocytes Relative: 7 %
Neutro Abs: 8.7 10*3/uL — ABNORMAL HIGH (ref 1.7–7.7)
Neutrophils Relative %: 79 %
Platelet Count: 136 10*3/uL — ABNORMAL LOW (ref 150–400)
RBC: 3.46 MIL/uL — ABNORMAL LOW (ref 4.22–5.81)
RDW: 21.7 % — ABNORMAL HIGH (ref 11.5–15.5)
WBC Count: 11 10*3/uL — ABNORMAL HIGH (ref 4.0–10.5)
nRBC: 0 % (ref 0.0–0.2)

## 2019-01-29 NOTE — Progress Notes (Signed)
Error

## 2019-01-30 DIAGNOSIS — B029 Zoster without complications: Secondary | ICD-10-CM

## 2019-01-30 HISTORY — DX: Zoster without complications: B02.9

## 2019-02-05 ENCOUNTER — Encounter: Payer: Self-pay | Admitting: Internal Medicine

## 2019-02-05 ENCOUNTER — Inpatient Hospital Stay: Payer: Medicaid Other

## 2019-02-05 ENCOUNTER — Inpatient Hospital Stay: Payer: Medicaid Other | Attending: Internal Medicine

## 2019-02-05 ENCOUNTER — Other Ambulatory Visit: Payer: Self-pay

## 2019-02-05 ENCOUNTER — Inpatient Hospital Stay (HOSPITAL_BASED_OUTPATIENT_CLINIC_OR_DEPARTMENT_OTHER): Payer: Medicaid Other | Admitting: Internal Medicine

## 2019-02-05 ENCOUNTER — Other Ambulatory Visit: Payer: Self-pay | Admitting: Internal Medicine

## 2019-02-05 VITALS — BP 114/90 | HR 100 | Temp 98.7°F | Resp 18 | Ht 70.0 in | Wt 176.6 lb

## 2019-02-05 DIAGNOSIS — J90 Pleural effusion, not elsewhere classified: Secondary | ICD-10-CM

## 2019-02-05 DIAGNOSIS — C3491 Malignant neoplasm of unspecified part of right bronchus or lung: Secondary | ICD-10-CM

## 2019-02-05 DIAGNOSIS — I252 Old myocardial infarction: Secondary | ICD-10-CM

## 2019-02-05 DIAGNOSIS — Z7982 Long term (current) use of aspirin: Secondary | ICD-10-CM

## 2019-02-05 DIAGNOSIS — C3411 Malignant neoplasm of upper lobe, right bronchus or lung: Secondary | ICD-10-CM | POA: Insufficient documentation

## 2019-02-05 DIAGNOSIS — G893 Neoplasm related pain (acute) (chronic): Secondary | ICD-10-CM

## 2019-02-05 DIAGNOSIS — M6281 Muscle weakness (generalized): Secondary | ICD-10-CM

## 2019-02-05 DIAGNOSIS — R5383 Other fatigue: Secondary | ICD-10-CM

## 2019-02-05 DIAGNOSIS — K5730 Diverticulosis of large intestine without perforation or abscess without bleeding: Secondary | ICD-10-CM

## 2019-02-05 DIAGNOSIS — Z5111 Encounter for antineoplastic chemotherapy: Secondary | ICD-10-CM | POA: Insufficient documentation

## 2019-02-05 DIAGNOSIS — Z79899 Other long term (current) drug therapy: Secondary | ICD-10-CM

## 2019-02-05 DIAGNOSIS — Z923 Personal history of irradiation: Secondary | ICD-10-CM | POA: Diagnosis not present

## 2019-02-05 DIAGNOSIS — K219 Gastro-esophageal reflux disease without esophagitis: Secondary | ICD-10-CM | POA: Diagnosis not present

## 2019-02-05 DIAGNOSIS — Z9221 Personal history of antineoplastic chemotherapy: Secondary | ICD-10-CM

## 2019-02-05 DIAGNOSIS — Z5112 Encounter for antineoplastic immunotherapy: Secondary | ICD-10-CM | POA: Diagnosis not present

## 2019-02-05 DIAGNOSIS — K1230 Oral mucositis (ulcerative), unspecified: Secondary | ICD-10-CM

## 2019-02-05 DIAGNOSIS — C778 Secondary and unspecified malignant neoplasm of lymph nodes of multiple regions: Secondary | ICD-10-CM | POA: Insufficient documentation

## 2019-02-05 DIAGNOSIS — Z7689 Persons encountering health services in other specified circumstances: Secondary | ICD-10-CM | POA: Insufficient documentation

## 2019-02-05 DIAGNOSIS — M545 Low back pain: Secondary | ICD-10-CM | POA: Diagnosis not present

## 2019-02-05 DIAGNOSIS — C7951 Secondary malignant neoplasm of bone: Secondary | ICD-10-CM

## 2019-02-05 DIAGNOSIS — C787 Secondary malignant neoplasm of liver and intrahepatic bile duct: Secondary | ICD-10-CM | POA: Diagnosis not present

## 2019-02-05 LAB — CBC WITH DIFFERENTIAL (CANCER CENTER ONLY)
Abs Immature Granulocytes: 0.11 10*3/uL — ABNORMAL HIGH (ref 0.00–0.07)
Basophils Absolute: 0 10*3/uL (ref 0.0–0.1)
Basophils Relative: 0 %
Eosinophils Absolute: 0 10*3/uL (ref 0.0–0.5)
Eosinophils Relative: 0 %
HCT: 35.1 % — ABNORMAL LOW (ref 39.0–52.0)
Hemoglobin: 11 g/dL — ABNORMAL LOW (ref 13.0–17.0)
Immature Granulocytes: 1 %
Lymphocytes Relative: 5 %
Lymphs Abs: 0.6 10*3/uL — ABNORMAL LOW (ref 0.7–4.0)
MCH: 29.7 pg (ref 26.0–34.0)
MCHC: 31.3 g/dL (ref 30.0–36.0)
MCV: 94.9 fL (ref 80.0–100.0)
Monocytes Absolute: 0.2 10*3/uL (ref 0.1–1.0)
Monocytes Relative: 2 %
Neutro Abs: 12.7 10*3/uL — ABNORMAL HIGH (ref 1.7–7.7)
Neutrophils Relative %: 92 %
Platelet Count: 237 10*3/uL (ref 150–400)
RBC: 3.7 MIL/uL — ABNORMAL LOW (ref 4.22–5.81)
RDW: 21.6 % — ABNORMAL HIGH (ref 11.5–15.5)
WBC Count: 13.7 10*3/uL — ABNORMAL HIGH (ref 4.0–10.5)
nRBC: 0 % (ref 0.0–0.2)

## 2019-02-05 LAB — TOTAL PROTEIN, URINE DIPSTICK: Protein, ur: <30 mg/dL — AB

## 2019-02-05 LAB — CMP (CANCER CENTER ONLY)
ALT: 18 U/L (ref 0–44)
AST: 24 U/L (ref 15–41)
Albumin: 3.2 g/dL — ABNORMAL LOW (ref 3.5–5.0)
Alkaline Phosphatase: 122 U/L (ref 38–126)
Anion gap: 11 (ref 5–15)
BUN: 29 mg/dL — ABNORMAL HIGH (ref 6–20)
CO2: 21 mmol/L — ABNORMAL LOW (ref 22–32)
Calcium: 9.7 mg/dL (ref 8.9–10.3)
Chloride: 106 mmol/L (ref 98–111)
Creatinine: 0.85 mg/dL (ref 0.61–1.24)
GFR, Est AFR Am: >60 mL/min (ref >60)
GFR, Estimated: >60 mL/min (ref >60)
Glucose, Bld: 118 mg/dL — ABNORMAL HIGH (ref 70–99)
Potassium: 4.5 mmol/L (ref 3.5–5.1)
Sodium: 138 mmol/L (ref 135–145)
Total Bilirubin: 0.5 mg/dL (ref 0.3–1.2)
Total Protein: 6.9 g/dL (ref 6.5–8.1)

## 2019-02-05 MED ORDER — DIPHENHYDRAMINE HCL 50 MG/ML IJ SOLN
INTRAMUSCULAR | Status: AC
  Filled 2019-02-05: qty 1

## 2019-02-05 MED ORDER — FAMOTIDINE IN NACL 20-0.9 MG/50ML-% IV SOLN
INTRAVENOUS | Status: AC
  Filled 2019-02-05: qty 50

## 2019-02-05 MED ORDER — FAMOTIDINE IN NACL 20-0.9 MG/50ML-% IV SOLN
20.0000 mg | Freq: Once | INTRAVENOUS | Status: AC
  Administered 2019-02-05: 20 mg via INTRAVENOUS

## 2019-02-05 MED ORDER — ACETAMINOPHEN 325 MG PO TABS
ORAL_TABLET | ORAL | Status: AC
  Filled 2019-02-05: qty 2

## 2019-02-05 MED ORDER — ACETAMINOPHEN 325 MG PO TABS
650.0000 mg | ORAL_TABLET | Freq: Once | ORAL | Status: AC
  Administered 2019-02-05: 650 mg via ORAL

## 2019-02-05 MED ORDER — CYCLOBENZAPRINE HCL 5 MG PO TABS: 5.0000 mg | ORAL_TABLET | Freq: Three times a day (TID) | ORAL | 0 refills | Status: DC | PRN

## 2019-02-05 MED ORDER — SODIUM CHLORIDE 0.9 % IV SOLN
65.0000 mg/m2 | Freq: Once | INTRAVENOUS | Status: AC
  Administered 2019-02-05: 130 mg via INTRAVENOUS
  Filled 2019-02-05: qty 13

## 2019-02-05 MED ORDER — DOXEPIN HCL 10 MG/ML PO CONC: ORAL | 12 refills | Status: DC

## 2019-02-05 MED ORDER — PEGFILGRASTIM 6 MG/0.6ML ~~LOC~~ PSKT
PREFILLED_SYRINGE | SUBCUTANEOUS | Status: AC
  Filled 2019-02-05: qty 0.6

## 2019-02-05 MED ORDER — SODIUM CHLORIDE 0.9 % IV SOLN
Freq: Once | INTRAVENOUS | Status: AC
  Administered 2019-02-05: 13:00:00 via INTRAVENOUS
  Filled 2019-02-05: qty 250

## 2019-02-05 MED ORDER — OXYCODONE-ACETAMINOPHEN 5-325 MG PO TABS: ORAL_TABLET | ORAL | 0 refills | Status: DC

## 2019-02-05 MED ORDER — DEXAMETHASONE SODIUM PHOSPHATE 10 MG/ML IJ SOLN
INTRAMUSCULAR | Status: AC
  Filled 2019-02-05: qty 1

## 2019-02-05 MED ORDER — PEGFILGRASTIM 6 MG/0.6ML ~~LOC~~ PSKT
6.0000 mg | PREFILLED_SYRINGE | Freq: Once | SUBCUTANEOUS | Status: AC
  Administered 2019-02-05: 16:00:00 6 mg via SUBCUTANEOUS

## 2019-02-05 MED ORDER — DEXAMETHASONE SODIUM PHOSPHATE 10 MG/ML IJ SOLN
10.0000 mg | Freq: Once | INTRAMUSCULAR | Status: AC
  Administered 2019-02-05: 10 mg via INTRAVENOUS

## 2019-02-05 MED ORDER — SODIUM CHLORIDE 0.9 % IV SOLN
10.0000 mg/kg | Freq: Once | INTRAVENOUS | Status: AC
  Administered 2019-02-05: 14:00:00 800 mg via INTRAVENOUS
  Filled 2019-02-05: qty 30

## 2019-02-05 MED ORDER — DIPHENHYDRAMINE HCL 50 MG/ML IJ SOLN
50.0000 mg | Freq: Once | INTRAMUSCULAR | Status: AC
  Administered 2019-02-05: 50 mg via INTRAVENOUS

## 2019-02-05 NOTE — Patient Instructions (Signed)
[facility] Discharge Instructions for Patients Receiving Chemotherapy  Today you received the following chemotherapy agents: cyramza, taxotere.  To help prevent nausea and vomiting after your treatment, we encourage you to take your nausea medication as prescribed.   If you develop nausea and vomiting that is not controlled by your nausea medication, call the clinic.   BELOW ARE SYMPTOMS THAT SHOULD BE REPORTED IMMEDIATELY:  *FEVER GREATER THAN 100.5 F  *CHILLS WITH OR WITHOUT FEVER  NAUSEA AND VOMITING THAT IS NOT CONTROLLED WITH YOUR NAUSEA MEDICATION  *UNUSUAL SHORTNESS OF BREATH  *UNUSUAL BRUISING OR BLEEDING  TENDERNESS IN MOUTH AND THROAT WITH OR WITHOUT PRESENCE OF ULCERS  *URINARY PROBLEMS  *BOWEL PROBLEMS  UNUSUAL RASH Items with * indicate a potential emergency and should be followed up as soon as possible.  Feel free to call the clinic should you have any questions or concerns. The clinic phone number is [phone].  Please show the [facility] at check-in to the Emergency Department and triage nurse.

## 2019-02-05 NOTE — Progress Notes (Signed)
[facility] [phone]   [phone]  OFFICE PROGRESS NOTE  [name], MD [address] [place] [address]  DIAGNOSIS: Stage IVB(T1b, N2, M1c)non-small cell lung cancer, adenocarcinoma presented with right upper lobe lung nodule in addition to right hilar and mediastinal lymphadenopathy as well as metastatic liver and bone disease diagnosed in February 2019.  Biomarker Findings Microsatellite status - MS-Stable Tumor Mutational Burden - TMB-Low (4 Muts/Mb) Genomic Findings For a complete list of the genes assayed, please refer to the Appendix. EGFR exon 20 insertion ([id number]) RB1 loss exons 9-17 [id number] 7 Disease relevant genes with no reportable alterations: KRAS, ALK, BRAF, MET, RET, ERBB2, ROS1  PDL 1 expression 5%  PRIOR THERAPY:  1) Palliative radiotherapy to the metastatic bone lesions in the cervical spine and left femur under the care of Dr. Sondra Come. 2) Systemic chemotherapy with carboplatin for AUC of 5, Alimta 500 mg/M2 and Keytruda 200 mg IV every 3 weeks.  First dose October 04, 2017.  Status post 3 cycles. 3) palliative stereotactic radiotherapy to the progressive liver lesion. 4)  Maintenance treatment with Alimta 500 mg/M2 and Keytruda 200 mg IV every 3 weeks.  Status post 12 cycles.  Last dose was giving August 14, 2018 discontinued secondary to disease progression.  CURRENT THERAPY: Systemic chemotherapy with docetaxel 75 mg/M2 and Cyramza 10 mg/KG every 3 weeks with Neulasta support.  First dose September 11, 2018.  Status post 7 cycles.  INTERVAL HISTORY: Anthony [name] 48 y.o. male returns to the clinic today for follow-up visit.  The patient is feeling fine today with no concerning complaints except for the low back pain and muscle weakness.  He has improvement of the herpes zoster rash after treatment with acyclovir.  He denied having any current chest pain, shortness of breath, cough or  hemoptysis.  He denied having any fever or chills.  He has no nausea, vomiting, diarrhea or constipation.  He has no headache or visual changes.  He tolerated the last cycle of his treatment well except for the fatigue.  He is here today for evaluation before starting cycle #8 of his chemotherapy.  MEDICAL HISTORY: Past Medical History:  Diagnosis Date   Acid reflux    History of radiation therapy 09/21/17-10/04/17   C4 spine 30 Gy in 10 fractions, left femur 30 Gy in 10 fractions   Non-small cell lung cancer ([facility])    NSTEMI (non-ST elevated myocardial infarction) ([facility]) 06/13/2017   [name] 06/14/2017   Tailbone injury since 1993   "cracked"    ALLERGIES:  has No Known Allergies.  MEDICATIONS:  Current Outpatient Medications  Medication Sig Dispense Refill   acetaminophen (TYLENOL) 500 MG tablet Take 500-1,000 mg by mouth daily as needed for moderate pain or headache.     acyclovir (ZOVIRAX) 200 MG/5ML suspension Take 10 mLs (400 mg total) by mouth 5 (five) times daily. (Patient not taking: Reported on 10/22/2018) 350 mL 0   aspirin EC 81 MG EC tablet Take 1 tablet (81 mg total) by mouth daily.     atorvastatin (LIPITOR) 40 MG tablet Take 1 tablet (40 mg total) by mouth daily. 90 tablet 3   bisacodyl (DULCOLAX) 10 MG suppository Place 10 mg rectally daily as needed for moderate constipation.     cetirizine (ZYRTEC) 10 MG tablet Take 10 mg by mouth daily as needed for allergies.      cyclobenzaprine (FLEXERIL) 5 MG tablet Take 1 tablet (5  mg total) by mouth 3 (three) times daily as needed for muscle spasms. (Patient not taking: Reported on 10/22/2018) 30 tablet 0   dexamethasone (DECADRON) 4 MG tablet 2 tablet p.o. twice daily the day before, day of and day after the chemotherapy every 3 weeks 40 tablet 0   doxepin (SINEQUAN) 10 MG/ML solution Mix 2.5 ml in 2.5 ml of water and rinse in mouth for 1 minute twice daily. Spit out solution after rinsing mouth. 120 mL 12   Ensure  (ENSURE) Take 237 mLs by mouth 4 (four) times daily. "max cafe mocha"     esomeprazole (NEXIUM) 20 MG capsule Take 20 mg by mouth every other day.     lidocaine (XYLOCAINE) 2 % solution Use as directed 5 mLs in the mouth or throat as needed for mouth pain. Rinse and spit 200 mL 3   lisinopril (PRINIVIL,ZESTRIL) 5 MG tablet TAKE 1 TABLET BY MOUTH EVERY DAY 90 tablet 3   magnesium citrate SOLN Take 1 Bottle by mouth once as needed for severe constipation.     magnesium hydroxide (MILK OF MAGNESIA) 400 MG/5ML suspension Take 30 mLs by mouth daily as needed for mild constipation.     metoprolol tartrate (LOPRESSOR) 25 MG tablet TAKE 1/2 TABLET BY MOUTH 2 TIMES DAILY. 90 tablet 0   oxyCODONE-acetaminophen (PERCOCET/ROXICET) 5-325 MG tablet TAKE 1 TABLET BY MOUTH EVERY 6 HOURS AS NEEDED FOR SEVERE PAIN. 30 tablet 0   prochlorperazine (COMPAZINE) 10 MG tablet Take 1 tablet (10 mg total) by mouth every 6 (six) hours as needed for nausea or vomiting. 30 tablet 1   valACYclovir (VALTREX) 500 MG tablet Take 1 tablet (500 mg total) by mouth 2 (two) times daily. 14 tablet 0   No current facility-administered medications for this visit.     SURGICAL HISTORY:  Past Surgical History:  Procedure Laterality Date   CARDIAC CATHETERIZATION  06/14/2017   CHOLECYSTECTOMY N/A 12/29/2017   Procedure: LAPAROSCOPIC CHOLECYSTECTOMY;  Surgeon: Ralene Ok, MD;  Location: [facility];  Service: General;  Laterality: N/A;   CHOLECYSTECTOMY, LAPAROSCOPIC N/A 12/29/2017   CORONARY ARTERY BYPASS GRAFT N/A 06/19/2017   Procedure: CORONARY ARTERY BYPASS GRAFTING (CABG), OFF PUMP, times one using the left internal mammary artery to LAD;  Surgeon: Grace Isaac, MD;  Location: [facility];  Service: Open Heart Surgery;  Laterality: N/A;   ESOPHAGOGASTRODUODENOSCOPY (EGD) WITH PROPOFOL N/A 11/30/2017   Procedure: ESOPHAGOGASTRODUODENOSCOPY (EGD) WITH PROPOFOL;  Surgeon: Gatha Mayer, MD;  Location: [facility];   Service: Endoscopy;  Laterality: N/A;   LEFT HEART CATH AND CORONARY ANGIOGRAPHY N/A 06/14/2017   Procedure: LEFT HEART CATH AND CORONARY ANGIOGRAPHY;  Surgeon: Sherren Mocha, MD;  Location: [facility];  Service: Cardiovascular;  Laterality: N/A;   TEE WITHOUT CARDIOVERSION N/A 06/19/2017   Procedure: TRANSESOPHAGEAL ECHOCARDIOGRAM (TEE);  Surgeon: Grace Isaac, MD;  Location: [facility];  Service: Open Heart Surgery;  Laterality: N/A;   TONSILLECTOMY  ~ 1977    REVIEW OF SYSTEMS:  A comprehensive review of systems was negative except for: Constitutional: positive for fatigue Musculoskeletal: positive for back pain and muscle weakness   PHYSICAL EXAMINATION: General appearance: alert, cooperative, fatigued and no distress Head: Normocephalic, without obvious abnormality, atraumatic Neck: no adenopathy, no JVD, supple, symmetrical, trachea midline and thyroid not enlarged, symmetric, no tenderness/mass/nodules Lymph nodes: Cervical, supraclavicular, and axillary nodes normal. Resp: clear to auscultation bilaterally Back: symmetric, no curvature. ROM normal. No CVA tenderness. Cardio: regular rate and rhythm, S1, S2 normal,  no murmur, click, rub or gallop GI: soft, non-tender; bowel sounds normal; no masses,  no organomegaly Extremities: extremities normal, atraumatic, no cyanosis or edema  ECOG PERFORMANCE STATUS: 1 - Symptomatic but completely ambulatory  Blood pressure 114/90, pulse 100, temperature 98.7 F (37.1 C), temperature source Oral, resp. rate 18, height '5\' 10"'$  (1.778 m), weight 176 lb 9.6 oz (80.1 kg), SpO2 97 %.  LABORATORY DATA: Lab Results  Component Value Date   WBC 13.7 (H) 02/05/2019   HGB 11.0 (L) 02/05/2019   HCT 35.1 (L) 02/05/2019   MCV 94.9 02/05/2019   PLT 237 02/05/2019      Chemistry      Component Value Date/Time   NA 136 01/29/2019 1322   NA 141 07/17/2017 1006   K 4.3 01/29/2019 1322   CL 102 01/29/2019 1322   CO2 25 01/29/2019 1322    BUN 19 01/29/2019 1322   BUN 12 07/17/2017 1006   CREATININE 0.76 01/29/2019 1322      Component Value Date/Time   CALCIUM 9.2 01/29/2019 1322   ALKPHOS 123 01/29/2019 1322   AST 28 01/29/2019 1322   ALT 18 01/29/2019 1322   BILITOT 0.5 01/29/2019 1322        RADIOGRAPHIC STUDIES: Ct Chest W Contrast  Result Date: 01/14/2019 CLINICAL DATA:  Patient with history of right lung cancer. Follow-up exam. EXAM: CT CHEST, ABDOMEN, AND PELVIS WITH CONTRAST TECHNIQUE: Multidetector CT imaging of the chest, abdomen and pelvis was performed following the standard protocol during bolus administration of intravenous contrast. CONTRAST:  121m OMNIPAQUE IOHEXOL 300 MG/ML  SOLN COMPARISON:  CT CAP 11/06/2018 FINDINGS: CT CHEST FINDINGS Cardiovascular: Normal heart size. Trace fluid superior pericardial recess. Coronary arterial and thoracic aortic vascular calcifications. Mediastinum/Nodes: No enlarged axillary, mediastinal or hilar lymphadenopathy. Normal appearance of the esophagus. Lungs/Pleura: Similar-appearing large right pleural effusion. Small left pleural effusion, new from prior. Central airways are patent. Subpleural atelectasis within the right lower lobe. No large area of consolidation. No pneumothorax. Previously described mass within the right lower hemithorax is nearly resolved. Stable appearing right upper lobe nodule measuring 1.2 x 1.2 cm (image 57; series 4). Musculoskeletal: Similar-appearing osseous metastasis including an 8 mm lytic lesion within the T1 vertebral body, patchy sclerotic lesion in the T4 vertebral body and 1.6 cm lytic lesion in the T6 vertebral body. Similar-appearing lucent lesion within the sternum (image 96; series 6). CT ABDOMEN PELVIS FINDINGS Hepatobiliary: Continued interval decrease in size of hepatic lesions including a 5 mm right hepatic lobe lesion (image 62; series 2), previously 9 mm and an 11 x 12 mm medial left hepatic lobe lesion (image 46; series 2),  previously 17 x 12 mm. No intrahepatic or extrahepatic biliary ductal dilatation. Pancreas: Unremarkable Spleen: Unremarkable Adrenals/Urinary Tract: Normal adrenal glands. Kidneys enhance symmetrically with contrast. No hydronephrosis. Urinary bladder is unremarkable. Stomach/Bowel: Sigmoid colonic diverticulosis. No CT evidence for acute diverticulitis. No evidence for small bowel obstruction. Moderate volume free fluid in the pelvis. Normal morphology of the stomach. No free intraperitoneal air. Vascular/Lymphatic: Normal caliber abdominal aorta. No retroperitoneal lymphadenopathy. Reproductive: Prostate is unremarkable. Other: None. Musculoskeletal: Similar-appearing lytic metastasis involving the L2 vertebral body. Slight interval increase in size of lytic metastasis involving the L4 vertebral body. IMPRESSION: 1. Continued interval decrease in size of right lower lobe lesion and hepatic metastasis. 2. Overall similar-appearing osseous metastatic disease. Slight interval increase in size lytic metastasis involving the L4 vertebral body. 3. New small left pleural effusion. 4. New small amount of fluid  in the pelvis and right lower abdominal fat stranding. Electronically Signed   By: Lovey Newcomer M.D.   On: 01/14/2019 15:15   Ct Abdomen Pelvis W Contrast  Result Date: 01/14/2019 CLINICAL DATA:  Patient with history of right lung cancer. Follow-up exam. EXAM: CT CHEST, ABDOMEN, AND PELVIS WITH CONTRAST TECHNIQUE: Multidetector CT imaging of the chest, abdomen and pelvis was performed following the standard protocol during bolus administration of intravenous contrast. CONTRAST:  124m OMNIPAQUE IOHEXOL 300 MG/ML  SOLN COMPARISON:  CT CAP 11/06/2018 FINDINGS: CT CHEST FINDINGS Cardiovascular: Normal heart size. Trace fluid superior pericardial recess. Coronary arterial and thoracic aortic vascular calcifications. Mediastinum/Nodes: No enlarged axillary, mediastinal or hilar lymphadenopathy. Normal appearance of  the esophagus. Lungs/Pleura: Similar-appearing large right pleural effusion. Small left pleural effusion, new from prior. Central airways are patent. Subpleural atelectasis within the right lower lobe. No large area of consolidation. No pneumothorax. Previously described mass within the right lower hemithorax is nearly resolved. Stable appearing right upper lobe nodule measuring 1.2 x 1.2 cm (image 57; series 4). Musculoskeletal: Similar-appearing osseous metastasis including an 8 mm lytic lesion within the T1 vertebral body, patchy sclerotic lesion in the T4 vertebral body and 1.6 cm lytic lesion in the T6 vertebral body. Similar-appearing lucent lesion within the sternum (image 96; series 6). CT ABDOMEN PELVIS FINDINGS Hepatobiliary: Continued interval decrease in size of hepatic lesions including a 5 mm right hepatic lobe lesion (image 62; series 2), previously 9 mm and an 11 x 12 mm medial left hepatic lobe lesion (image 46; series 2), previously 17 x 12 mm. No intrahepatic or extrahepatic biliary ductal dilatation. Pancreas: Unremarkable Spleen: Unremarkable Adrenals/Urinary Tract: Normal adrenal glands. Kidneys enhance symmetrically with contrast. No hydronephrosis. Urinary bladder is unremarkable. Stomach/Bowel: Sigmoid colonic diverticulosis. No CT evidence for acute diverticulitis. No evidence for small bowel obstruction. Moderate volume free fluid in the pelvis. Normal morphology of the stomach. No free intraperitoneal air. Vascular/Lymphatic: Normal caliber abdominal aorta. No retroperitoneal lymphadenopathy. Reproductive: Prostate is unremarkable. Other: None. Musculoskeletal: Similar-appearing lytic metastasis involving the L2 vertebral body. Slight interval increase in size of lytic metastasis involving the L4 vertebral body. IMPRESSION: 1. Continued interval decrease in size of right lower lobe lesion and hepatic metastasis. 2. Overall similar-appearing osseous metastatic disease. Slight interval  increase in size lytic metastasis involving the L4 vertebral body. 3. New small left pleural effusion. 4. New small amount of fluid in the pelvis and right lower abdominal fat stranding. Electronically Signed   By: DLovey NewcomerM.D.   On: 01/14/2019 15:15    ASSESSMENT AND PLAN: This is a very pleasant 48years old white male with stage IV non-small cell lung cancer, adenocarcinoma with resistant EGFR mutation in exon 20 and PDL 1 expression of 5%. The patient completed a course of palliative radiotherapy to the cervical spine as well as left hip under the care of Dr. KSondra Come.Marland KitchenHe completed induction systemic chemotherapy with carboplatin, Alimta and Keytruda status post 3 cycles.  He has no evidence for disease progression after the induction phase of his treatment. The patient was started on maintenance treatment with Alimta and Ketruda (pembrolizumab) status post 12 cycles.  He has been tolerating this treatment well with no concerning complaints.  He had repeat CT scan of the chest, abdomen and pelvis performed recently.  I personally and independently reviewed the scan images and discussed the results with the patient and his friend today. Unfortunately his scan showed significant evidence for disease progression with enlargement of right  lung mass as well as pulmonary nodules in addition to mediastinal lymphadenopathy as well as liver and bone disease. The patient was started on systemic chemotherapy with docetaxel and Cyramza status post 7 cycles.  The patient continues to tolerate this treatment well with no concerning complaints except for mild fatigue. I recommended for him to proceed with cycle #8 today as planned. He will come back for follow-up visit in 3 weeks for evaluation before the next cycle of his treatment. For pain management I will give the patient a refill of oxycodone in addition to Flexeril. He was advised to call immediately if he has any concerning symptoms in the  interval. The patient voices understanding of current disease status and treatment options and is in agreement with the current care plan. All questions were answered. The patient knows to call the clinic with any problems, questions or concerns. We can certainly see the patient much sooner if necessary.  Disclaimer: This note was dictated with voice recognition software. Similar sounding words can inadvertently be transcribed and may not be corrected upon review.

## 2019-02-12 ENCOUNTER — Other Ambulatory Visit: Payer: Self-pay

## 2019-02-12 ENCOUNTER — Inpatient Hospital Stay: Payer: Medicaid Other

## 2019-02-12 DIAGNOSIS — C3491 Malignant neoplasm of unspecified part of right bronchus or lung: Secondary | ICD-10-CM

## 2019-02-12 DIAGNOSIS — Z5112 Encounter for antineoplastic immunotherapy: Secondary | ICD-10-CM | POA: Diagnosis not present

## 2019-02-12 LAB — CBC WITH DIFFERENTIAL (CANCER CENTER ONLY)
Abs Immature Granulocytes: 0.06 10*3/uL (ref 0.00–0.07)
Basophils Absolute: 0 10*3/uL (ref 0.0–0.1)
Basophils Relative: 1 %
Eosinophils Absolute: 0 10*3/uL (ref 0.0–0.5)
Eosinophils Relative: 1 %
HCT: 37.6 % — ABNORMAL LOW (ref 39.0–52.0)
Hemoglobin: 12.2 g/dL — ABNORMAL LOW (ref 13.0–17.0)
Immature Granulocytes: 2 %
Lymphocytes Relative: 23 %
Lymphs Abs: 0.8 10*3/uL (ref 0.7–4.0)
MCH: 30.1 pg (ref 26.0–34.0)
MCHC: 32.4 g/dL (ref 30.0–36.0)
MCV: 92.8 fL (ref 80.0–100.0)
Monocytes Absolute: 0.6 10*3/uL (ref 0.1–1.0)
Monocytes Relative: 17 %
Neutro Abs: 2.1 10*3/uL (ref 1.7–7.7)
Neutrophils Relative %: 56 %
Platelet Count: 195 10*3/uL (ref 150–400)
RBC: 4.05 MIL/uL — ABNORMAL LOW (ref 4.22–5.81)
RDW: 20.4 % — ABNORMAL HIGH (ref 11.5–15.5)
WBC Count: 3.8 10*3/uL — ABNORMAL LOW (ref 4.0–10.5)
nRBC: 0.5 % — ABNORMAL HIGH (ref 0.0–0.2)

## 2019-02-12 LAB — CMP (CANCER CENTER ONLY)
ALT: 58 U/L — ABNORMAL HIGH (ref 0–44)
AST: 45 U/L — ABNORMAL HIGH (ref 15–41)
Albumin: 3.1 g/dL — ABNORMAL LOW (ref 3.5–5.0)
Alkaline Phosphatase: 103 U/L (ref 38–126)
Anion gap: 10 (ref 5–15)
BUN: 32 mg/dL — ABNORMAL HIGH (ref 6–20)
CO2: 24 mmol/L (ref 22–32)
Calcium: 8.9 mg/dL (ref 8.9–10.3)
Chloride: 101 mmol/L (ref 98–111)
Creatinine: 0.73 mg/dL (ref 0.61–1.24)
GFR, Est AFR Am: >60 mL/min (ref >60)
GFR, Estimated: >60 mL/min (ref >60)
Glucose, Bld: 98 mg/dL (ref 70–99)
Potassium: 4.4 mmol/L (ref 3.5–5.1)
Sodium: 135 mmol/L (ref 135–145)
Total Bilirubin: 0.8 mg/dL (ref 0.3–1.2)
Total Protein: 6.2 g/dL — ABNORMAL LOW (ref 6.5–8.1)

## 2019-02-15 ENCOUNTER — Other Ambulatory Visit: Payer: Self-pay | Admitting: Cardiology

## 2019-02-15 ENCOUNTER — Other Ambulatory Visit: Payer: Self-pay | Admitting: Internal Medicine

## 2019-02-19 ENCOUNTER — Other Ambulatory Visit: Payer: Self-pay

## 2019-02-19 ENCOUNTER — Other Ambulatory Visit: Payer: Self-pay | Admitting: Internal Medicine

## 2019-02-19 ENCOUNTER — Inpatient Hospital Stay: Payer: Medicaid Other

## 2019-02-19 ENCOUNTER — Telehealth: Payer: Self-pay | Admitting: *Deleted

## 2019-02-19 DIAGNOSIS — Z5112 Encounter for antineoplastic immunotherapy: Secondary | ICD-10-CM | POA: Diagnosis not present

## 2019-02-19 DIAGNOSIS — G893 Neoplasm related pain (acute) (chronic): Secondary | ICD-10-CM

## 2019-02-19 DIAGNOSIS — C3491 Malignant neoplasm of unspecified part of right bronchus or lung: Secondary | ICD-10-CM

## 2019-02-19 LAB — CBC WITH DIFFERENTIAL (CANCER CENTER ONLY)
Abs Immature Granulocytes: 0.24 10*3/uL — ABNORMAL HIGH (ref 0.00–0.07)
Basophils Absolute: 0.1 10*3/uL (ref 0.0–0.1)
Basophils Relative: 0 %
Eosinophils Absolute: 0 10*3/uL (ref 0.0–0.5)
Eosinophils Relative: 0 %
HCT: 37.3 % — ABNORMAL LOW (ref 39.0–52.0)
Hemoglobin: 11.7 g/dL — ABNORMAL LOW (ref 13.0–17.0)
Immature Granulocytes: 1 %
Lymphocytes Relative: 7 %
Lymphs Abs: 1.4 10*3/uL (ref 0.7–4.0)
MCH: 29.6 pg (ref 26.0–34.0)
MCHC: 31.4 g/dL (ref 30.0–36.0)
MCV: 94.4 fL (ref 80.0–100.0)
Monocytes Absolute: 1 10*3/uL (ref 0.1–1.0)
Monocytes Relative: 5 %
Neutro Abs: 16.9 10*3/uL — ABNORMAL HIGH (ref 1.7–7.7)
Neutrophils Relative %: 87 %
Platelet Count: 145 10*3/uL — ABNORMAL LOW (ref 150–400)
RBC: 3.95 MIL/uL — ABNORMAL LOW (ref 4.22–5.81)
RDW: 20.3 % — ABNORMAL HIGH (ref 11.5–15.5)
WBC Count: 19.6 10*3/uL — ABNORMAL HIGH (ref 4.0–10.5)
nRBC: 0 % (ref 0.0–0.2)

## 2019-02-19 LAB — CMP (CANCER CENTER ONLY)
ALT: 18 U/L (ref 0–44)
AST: 25 U/L (ref 15–41)
Albumin: 3 g/dL — ABNORMAL LOW (ref 3.5–5.0)
Alkaline Phosphatase: 165 U/L — ABNORMAL HIGH (ref 38–126)
Anion gap: 10 (ref 5–15)
BUN: 25 mg/dL — ABNORMAL HIGH (ref 6–20)
CO2: 23 mmol/L (ref 22–32)
Calcium: 9.5 mg/dL (ref 8.9–10.3)
Chloride: 103 mmol/L (ref 98–111)
Creatinine: 0.7 mg/dL (ref 0.61–1.24)
GFR, Est AFR Am: >60 mL/min (ref >60)
GFR, Estimated: >60 mL/min (ref >60)
Glucose, Bld: 83 mg/dL (ref 70–99)
Potassium: 4.1 mmol/L (ref 3.5–5.1)
Sodium: 136 mmol/L (ref 135–145)
Total Bilirubin: 0.5 mg/dL (ref 0.3–1.2)
Total Protein: 6.5 g/dL (ref 6.5–8.1)

## 2019-02-19 MED ORDER — OXYCODONE-ACETAMINOPHEN 5-325 MG PO TABS: ORAL_TABLET | ORAL | 0 refills | Status: DC

## 2019-02-19 NOTE — Telephone Encounter (Signed)
Received call from pt requesting refill of his percocet 5/325. This was last filled on 02/05/19 for # 30 tabs.  He uses [facility] on [address].

## 2019-02-22 ENCOUNTER — Telehealth: Payer: Self-pay | Admitting: *Deleted

## 2019-02-22 ENCOUNTER — Other Ambulatory Visit: Payer: Self-pay | Admitting: Internal Medicine

## 2019-02-22 MED ORDER — CYCLOBENZAPRINE HCL 5 MG PO TABS: 5.0000 mg | ORAL_TABLET | Freq: Three times a day (TID) | ORAL | 0 refills | Status: DC | PRN

## 2019-02-22 NOTE — Telephone Encounter (Signed)
Anthony [name] left a message requesting a refill of flexeril.

## 2019-02-26 ENCOUNTER — Inpatient Hospital Stay: Payer: Medicaid Other

## 2019-02-26 ENCOUNTER — Inpatient Hospital Stay (HOSPITAL_BASED_OUTPATIENT_CLINIC_OR_DEPARTMENT_OTHER): Payer: Medicaid Other | Admitting: Internal Medicine

## 2019-02-26 ENCOUNTER — Other Ambulatory Visit: Payer: Self-pay

## 2019-02-26 ENCOUNTER — Telehealth: Payer: Self-pay | Admitting: Internal Medicine

## 2019-02-26 ENCOUNTER — Encounter: Payer: Self-pay | Admitting: Internal Medicine

## 2019-02-26 VITALS — BP 100/75 | HR 80 | Temp 98.7°F | Resp 16

## 2019-02-26 DIAGNOSIS — Z923 Personal history of irradiation: Secondary | ICD-10-CM

## 2019-02-26 DIAGNOSIS — C7951 Secondary malignant neoplasm of bone: Secondary | ICD-10-CM

## 2019-02-26 DIAGNOSIS — K219 Gastro-esophageal reflux disease without esophagitis: Secondary | ICD-10-CM

## 2019-02-26 DIAGNOSIS — M6281 Muscle weakness (generalized): Secondary | ICD-10-CM

## 2019-02-26 DIAGNOSIS — C3411 Malignant neoplasm of upper lobe, right bronchus or lung: Secondary | ICD-10-CM | POA: Diagnosis not present

## 2019-02-26 DIAGNOSIS — J90 Pleural effusion, not elsewhere classified: Secondary | ICD-10-CM

## 2019-02-26 DIAGNOSIS — C3491 Malignant neoplasm of unspecified part of right bronchus or lung: Secondary | ICD-10-CM

## 2019-02-26 DIAGNOSIS — C778 Secondary and unspecified malignant neoplasm of lymph nodes of multiple regions: Secondary | ICD-10-CM

## 2019-02-26 DIAGNOSIS — Z5111 Encounter for antineoplastic chemotherapy: Secondary | ICD-10-CM

## 2019-02-26 DIAGNOSIS — R5383 Other fatigue: Secondary | ICD-10-CM

## 2019-02-26 DIAGNOSIS — M545 Low back pain: Secondary | ICD-10-CM

## 2019-02-26 DIAGNOSIS — I252 Old myocardial infarction: Secondary | ICD-10-CM

## 2019-02-26 DIAGNOSIS — G893 Neoplasm related pain (acute) (chronic): Secondary | ICD-10-CM

## 2019-02-26 DIAGNOSIS — Z5112 Encounter for antineoplastic immunotherapy: Secondary | ICD-10-CM

## 2019-02-26 DIAGNOSIS — K1230 Oral mucositis (ulcerative), unspecified: Secondary | ICD-10-CM

## 2019-02-26 DIAGNOSIS — K5730 Diverticulosis of large intestine without perforation or abscess without bleeding: Secondary | ICD-10-CM

## 2019-02-26 DIAGNOSIS — C787 Secondary malignant neoplasm of liver and intrahepatic bile duct: Secondary | ICD-10-CM | POA: Diagnosis not present

## 2019-02-26 DIAGNOSIS — C3490 Malignant neoplasm of unspecified part of unspecified bronchus or lung: Secondary | ICD-10-CM

## 2019-02-26 DIAGNOSIS — Z9221 Personal history of antineoplastic chemotherapy: Secondary | ICD-10-CM

## 2019-02-26 DIAGNOSIS — Z7689 Persons encountering health services in other specified circumstances: Secondary | ICD-10-CM

## 2019-02-26 DIAGNOSIS — Z79899 Other long term (current) drug therapy: Secondary | ICD-10-CM

## 2019-02-26 DIAGNOSIS — Z7982 Long term (current) use of aspirin: Secondary | ICD-10-CM

## 2019-02-26 LAB — CBC WITH DIFFERENTIAL (CANCER CENTER ONLY)
Abs Immature Granulocytes: 0.06 10*3/uL (ref 0.00–0.07)
Basophils Absolute: 0.1 10*3/uL (ref 0.0–0.1)
Basophils Relative: 1 %
Eosinophils Absolute: 0 10*3/uL (ref 0.0–0.5)
Eosinophils Relative: 0 %
HCT: 35.3 % — ABNORMAL LOW (ref 39.0–52.0)
Hemoglobin: 11.2 g/dL — ABNORMAL LOW (ref 13.0–17.0)
Immature Granulocytes: 1 %
Lymphocytes Relative: 9 %
Lymphs Abs: 1 10*3/uL (ref 0.7–4.0)
MCH: 29.8 pg (ref 26.0–34.0)
MCHC: 31.7 g/dL (ref 30.0–36.0)
MCV: 93.9 fL (ref 80.0–100.0)
Monocytes Absolute: 0.9 10*3/uL (ref 0.1–1.0)
Monocytes Relative: 7 %
Neutro Abs: 10 10*3/uL — ABNORMAL HIGH (ref 1.7–7.7)
Neutrophils Relative %: 82 %
Platelet Count: 187 10*3/uL (ref 150–400)
RBC: 3.76 MIL/uL — ABNORMAL LOW (ref 4.22–5.81)
RDW: 20.4 % — ABNORMAL HIGH (ref 11.5–15.5)
WBC Count: 12.1 10*3/uL — ABNORMAL HIGH (ref 4.0–10.5)
nRBC: 0 % (ref 0.0–0.2)

## 2019-02-26 LAB — CMP (CANCER CENTER ONLY)
ALT: 15 U/L (ref 0–44)
AST: 27 U/L (ref 15–41)
Albumin: 2.9 g/dL — ABNORMAL LOW (ref 3.5–5.0)
Alkaline Phosphatase: 138 U/L — ABNORMAL HIGH (ref 38–126)
Anion gap: 11 (ref 5–15)
BUN: 14 mg/dL (ref 6–20)
CO2: 24 mmol/L (ref 22–32)
Calcium: 9.6 mg/dL (ref 8.9–10.3)
Chloride: 101 mmol/L (ref 98–111)
Creatinine: 0.73 mg/dL (ref 0.61–1.24)
GFR, Est AFR Am: >60 mL/min (ref >60)
GFR, Estimated: >60 mL/min (ref >60)
Glucose, Bld: 85 mg/dL (ref 70–99)
Potassium: 4.1 mmol/L (ref 3.5–5.1)
Sodium: 136 mmol/L (ref 135–145)
Total Bilirubin: 0.4 mg/dL (ref 0.3–1.2)
Total Protein: 6.5 g/dL (ref 6.5–8.1)

## 2019-02-26 LAB — TOTAL PROTEIN, URINE DIPSTICK: Protein, ur: NEGATIVE mg/dL

## 2019-02-26 MED ORDER — ACETAMINOPHEN 325 MG PO TABS
ORAL_TABLET | ORAL | Status: AC
  Filled 2019-02-26: qty 2

## 2019-02-26 MED ORDER — SODIUM CHLORIDE 0.9 % IV SOLN
10.0000 mg/kg | Freq: Once | INTRAVENOUS | Status: AC
  Administered 2019-02-26: 800 mg via INTRAVENOUS
  Filled 2019-02-26: qty 50

## 2019-02-26 MED ORDER — DIPHENHYDRAMINE HCL 50 MG/ML IJ SOLN
50.0000 mg | Freq: Once | INTRAMUSCULAR | Status: AC
  Administered 2019-02-26: 11:00:00 50 mg via INTRAVENOUS

## 2019-02-26 MED ORDER — GABAPENTIN 100 MG PO CAPS: 100.0000 mg | ORAL_CAPSULE | Freq: Three times a day (TID) | ORAL | 1 refills | Status: DC

## 2019-02-26 MED ORDER — PEGFILGRASTIM 6 MG/0.6ML ~~LOC~~ PSKT
PREFILLED_SYRINGE | SUBCUTANEOUS | Status: AC
  Filled 2019-02-26: qty 0.6

## 2019-02-26 MED ORDER — ACETAMINOPHEN 325 MG PO TABS
650.0000 mg | ORAL_TABLET | Freq: Once | ORAL | Status: AC
  Administered 2019-02-26: 650 mg via ORAL

## 2019-02-26 MED ORDER — FAMOTIDINE IN NACL 20-0.9 MG/50ML-% IV SOLN
INTRAVENOUS | Status: AC
  Filled 2019-02-26: qty 50

## 2019-02-26 MED ORDER — SODIUM CHLORIDE 0.9 % IV SOLN
Freq: Once | INTRAVENOUS | Status: AC
  Administered 2019-02-26: 10:00:00 via INTRAVENOUS
  Filled 2019-02-26: qty 250

## 2019-02-26 MED ORDER — DIPHENHYDRAMINE HCL 50 MG/ML IJ SOLN
INTRAMUSCULAR | Status: AC
  Filled 2019-02-26: qty 1

## 2019-02-26 MED ORDER — OXYCODONE-ACETAMINOPHEN 5-325 MG PO TABS: ORAL_TABLET | ORAL | 0 refills | Status: DC

## 2019-02-26 MED ORDER — DOXEPIN HCL 10 MG/ML PO CONC: ORAL | 12 refills | Status: AC

## 2019-02-26 MED ORDER — SODIUM CHLORIDE 0.9% FLUSH
10.0000 mL | INTRAVENOUS | Status: AC | PRN
  Filled 2019-02-26: qty 10

## 2019-02-26 MED ORDER — DEXAMETHASONE SODIUM PHOSPHATE 10 MG/ML IJ SOLN
INTRAMUSCULAR | Status: AC
  Filled 2019-02-26: qty 1

## 2019-02-26 MED ORDER — DEXAMETHASONE SODIUM PHOSPHATE 10 MG/ML IJ SOLN
10.0000 mg | Freq: Once | INTRAMUSCULAR | Status: AC
  Administered 2019-02-26: 11:00:00 10 mg via INTRAVENOUS

## 2019-02-26 MED ORDER — FAMOTIDINE IN NACL 20-0.9 MG/50ML-% IV SOLN
20.0000 mg | Freq: Once | INTRAVENOUS | Status: AC
  Administered 2019-02-26: 11:00:00 20 mg via INTRAVENOUS

## 2019-02-26 MED ORDER — PEGFILGRASTIM 6 MG/0.6ML ~~LOC~~ PSKT
6.0000 mg | PREFILLED_SYRINGE | Freq: Once | SUBCUTANEOUS | Status: AC
  Administered 2019-02-26: 6 mg via SUBCUTANEOUS

## 2019-02-26 MED ORDER — SODIUM CHLORIDE 0.9 % IV SOLN
65.0000 mg/m2 | Freq: Once | INTRAVENOUS | Status: AC
  Administered 2019-02-26: 130 mg via INTRAVENOUS
  Filled 2019-02-26: qty 13

## 2019-02-26 NOTE — Progress Notes (Signed)
[facility] [phone]   [phone]  OFFICE PROGRESS NOTE  [name], MD [address] [place] [address]  DIAGNOSIS: Stage IVB(T1b, N2, M1c)non-small cell lung cancer, adenocarcinoma presented with right upper lobe lung nodule in addition to right hilar and mediastinal lymphadenopathy as well as metastatic liver and bone disease diagnosed in February 2019.  Biomarker Findings Microsatellite status - MS-Stable Tumor Mutational Burden - TMB-Low (4 Muts/Mb) Genomic Findings For a complete list of the genes assayed, please refer to the Appendix. EGFR exon 20 insertion ([id number]) RB1 loss exons 9-17 TP53 [id number] 7 Disease relevant genes with no reportable alterations: KRAS, ALK, BRAF, MET, RET, ERBB2, ROS1  PDL 1 expression 5%  PRIOR THERAPY:  1) Palliative radiotherapy to the metastatic bone lesions in the cervical spine and left femur under the care of Dr. Sondra Come. 2) Systemic chemotherapy with carboplatin for AUC of 5, Alimta 500 mg/M2 and Keytruda 200 mg IV every 3 weeks.  First dose October 04, 2017.  Status post 3 cycles. 3) palliative stereotactic radiotherapy to the progressive liver lesion. 4)  Maintenance treatment with Alimta 500 mg/M2 and Keytruda 200 mg IV every 3 weeks.  Status post 12 cycles.  Last dose was giving August 14, 2018 discontinued secondary to disease progression.  CURRENT THERAPY: Systemic chemotherapy with docetaxel 75 mg/M2 and Cyramza 10 mg/KG every 3 weeks with Neulasta support.  First dose September 11, 2018.  Status post 8 cycles.  INTERVAL HISTORY: Anthony [name] 48 y.o. male returns to the clinic today for follow-up visit.  The patient is feeling fine today with no concerning complaints except for the neuropathic pain from the shingles on the left side of the lower back.  He also has generalized pain all over his body and takes Percocet 3 times a day in addition to Flexeril.  He  denied having any current chest pain, shortness of breath, cough or hemoptysis.  He denied having any fever or chills.  He has no nausea, vomiting, diarrhea but has occasional constipation.  He has no headache or visual changes.  He is good but unfortunately lost few more pounds since his last visit.  He is here today for evaluation before starting cycle #9 of his treatment.  MEDICAL HISTORY: Past Medical History:  Diagnosis Date  . Acid reflux   . History of radiation therapy 09/21/17-10/04/17   C4 spine 30 Gy in 10 fractions, left femur 30 Gy in 10 fractions  . Non-small cell lung cancer ([facility])   . NSTEMI (non-ST elevated myocardial infarction) ([facility]) 06/13/2017   [name] 06/14/2017  . Tailbone injury since 1993   "cracked"    ALLERGIES:  has No Known Allergies.  MEDICATIONS:  Current Outpatient Medications  Medication Sig Dispense Refill  . acetaminophen (TYLENOL) 500 MG tablet Take 500-1,000 mg by mouth daily as needed for moderate pain or headache.    Marland Kitchen acyclovir (ZOVIRAX) 200 MG/5ML suspension Take 10 mLs (400 mg total) by mouth 5 (five) times daily. (Patient not taking: Reported on 10/22/2018) 350 mL 0  . aspirin EC 81 MG EC tablet Take 1 tablet (81 mg total) by mouth daily.    Marland Kitchen atorvastatin (LIPITOR) 40 MG tablet Take 1 tablet (40 mg total) by mouth daily. Please make yearly appt with [name] for September for future refills. 1st attempt 90 tablet 0  . bisacodyl (DULCOLAX) 10 MG suppository Place 10 mg rectally daily as needed for moderate constipation.    Marland Kitchen  cetirizine (ZYRTEC) 10 MG tablet Take 10 mg by mouth daily as needed for allergies.     . cyclobenzaprine (FLEXERIL) 5 MG tablet Take 1 tablet (5 mg total) by mouth 3 (three) times daily as needed for muscle spasms. 30 tablet 0  . dexamethasone (DECADRON) 4 MG tablet 2 tablet p.o. twice daily the day before, day of and day after the chemotherapy every 3 weeks 40 tablet 0  . doxepin (SINEQUAN) 10 MG/ML solution Mix 2.5 ml in 2.5  ml of water and rinse in mouth for 1 minute twice daily. Spit out solution after rinsing mouth. 120 mL 12  . Ensure (ENSURE) Take 237 mLs by mouth 4 (four) times daily. "max cafe mocha"    . esomeprazole (NEXIUM) 20 MG capsule Take 20 mg by mouth every other day.    . lidocaine (XYLOCAINE) 2 % solution Use as directed 5 mLs in the mouth or throat as needed for mouth pain. Rinse and spit 200 mL 3  . lisinopril (ZESTRIL) 5 MG tablet TAKE 1 TABLET BY MOUTH EVERY DAY 30 tablet 2  . magnesium citrate SOLN Take 1 Bottle by mouth once as needed for severe constipation.    . magnesium hydroxide (MILK OF MAGNESIA) 400 MG/5ML suspension Take 30 mLs by mouth daily as needed for mild constipation.    . metoprolol tartrate (LOPRESSOR) 25 MG tablet TAKE 1/2 TABLET BY MOUTH 2 TIMES DAILY. 90 tablet 0  . oxyCODONE-acetaminophen (PERCOCET/ROXICET) 5-325 MG tablet TAKE 1 TABLET BY MOUTH EVERY 6 HOURS AS NEEDED FOR SEVERE PAIN. 30 tablet 0  . prochlorperazine (COMPAZINE) 10 MG tablet Take 1 tablet (10 mg total) by mouth every 6 (six) hours as needed for nausea or vomiting. 30 tablet 1  . valACYclovir (VALTREX) 500 MG tablet Take 1 tablet (500 mg total) by mouth 2 (two) times daily. 14 tablet 0   No current facility-administered medications for this visit.    Facility-Administered Medications Ordered in Other Visits  Medication Dose Route Frequency Provider Last Rate Last Dose  . sodium chloride flush (NS) 0.9 % injection 10 mL  10 mL Intravenous PRN [name], MD        SURGICAL HISTORY:  Past Surgical History:  Procedure Laterality Date  . CARDIAC CATHETERIZATION  06/14/2017  . CHOLECYSTECTOMY N/A 12/29/2017   Procedure: LAPAROSCOPIC CHOLECYSTECTOMY;  Surgeon: Ralene Ok, MD;  Location: [facility];  Service: General;  Laterality: N/A;  . CHOLECYSTECTOMY, LAPAROSCOPIC N/A 12/29/2017  . CORONARY ARTERY BYPASS GRAFT N/A 06/19/2017   Procedure: CORONARY ARTERY BYPASS GRAFTING (CABG), OFF PUMP, times one  using the left internal mammary artery to LAD;  Surgeon: Grace Isaac, MD;  Location: [facility];  Service: Open Heart Surgery;  Laterality: N/A;  . ESOPHAGOGASTRODUODENOSCOPY (EGD) WITH PROPOFOL N/A 11/30/2017   Procedure: ESOPHAGOGASTRODUODENOSCOPY (EGD) WITH PROPOFOL;  Surgeon: Gatha Mayer, MD;  Location: [facility];  Service: Endoscopy;  Laterality: N/A;  . LEFT HEART CATH AND CORONARY ANGIOGRAPHY N/A 06/14/2017   Procedure: LEFT HEART CATH AND CORONARY ANGIOGRAPHY;  Surgeon: Sherren Mocha, MD;  Location: [facility];  Service: Cardiovascular;  Laterality: N/A;  . TEE WITHOUT CARDIOVERSION N/A 06/19/2017   Procedure: TRANSESOPHAGEAL ECHOCARDIOGRAM (TEE);  Surgeon: Grace Isaac, MD;  Location: [facility];  Service: Open Heart Surgery;  Laterality: N/A;  . TONSILLECTOMY  ~ 1977    REVIEW OF SYSTEMS:  A comprehensive review of systems was negative except for: Constitutional: positive for fatigue and weight loss Gastrointestinal: positive for constipation Musculoskeletal:  positive for back pain and muscle weakness   PHYSICAL EXAMINATION: General appearance: alert, cooperative, fatigued and no distress Head: Normocephalic, without obvious abnormality, atraumatic Neck: no adenopathy, no JVD, supple, symmetrical, trachea midline and thyroid not enlarged, symmetric, no tenderness/mass/nodules Lymph nodes: Cervical, supraclavicular, and axillary nodes normal. Resp: clear to auscultation bilaterally Back: symmetric, no curvature. ROM normal. No CVA tenderness. Cardio: regular rate and rhythm, S1, S2 normal, no murmur, click, rub or gallop GI: soft, non-tender; bowel sounds normal; no masses,  no organomegaly Extremities: extremities normal, atraumatic, no cyanosis or edema  ECOG PERFORMANCE STATUS: 1 - Symptomatic but completely ambulatory  Blood pressure (P) 116/88, pulse (P) 95, temperature (P) 98.7 F (37.1 C), temperature source (P) Oral, resp. rate (P) 18, weight (P) 172 lb  0.5 oz (78 kg).  LABORATORY DATA: Lab Results  Component Value Date   WBC 12.1 (H) 02/26/2019   HGB 11.2 (L) 02/26/2019   HCT 35.3 (L) 02/26/2019   MCV 93.9 02/26/2019   PLT 187 02/26/2019      Chemistry      Component Value Date/Time   NA 136 02/19/2019 1320   NA 141 07/17/2017 1006   K 4.1 02/19/2019 1320   CL 103 02/19/2019 1320   CO2 23 02/19/2019 1320   BUN 25 (H) 02/19/2019 1320   BUN 12 07/17/2017 1006   CREATININE 0.70 02/19/2019 1320      Component Value Date/Time   CALCIUM 9.5 02/19/2019 1320   ALKPHOS 165 (H) 02/19/2019 1320   AST 25 02/19/2019 1320   ALT 18 02/19/2019 1320   BILITOT 0.5 02/19/2019 1320        RADIOGRAPHIC STUDIES: No results found.  ASSESSMENT AND PLAN: This is a very pleasant 48 years old white male with stage IV non-small cell lung cancer, adenocarcinoma with resistant EGFR mutation in exon 20 and PDL 1 expression of 5%. The patient completed a course of palliative radiotherapy to the cervical spine as well as left hip under the care of Dr. Sondra Come.Marland Kitchen He completed induction systemic chemotherapy with carboplatin, Alimta and Keytruda status post 3 cycles.  He has no evidence for disease progression after the induction phase of his treatment. The patient was started on maintenance treatment with Alimta and Ketruda (pembrolizumab) status post 12 cycles.  He has been tolerating this treatment well with no concerning complaints.  He had repeat CT scan of the chest, abdomen and pelvis performed recently.  I personally and independently reviewed the scan images and discussed the results with the patient and his friend today. Unfortunately his scan showed significant evidence for disease progression with enlargement of right lung mass as well as pulmonary nodules in addition to mediastinal lymphadenopathy as well as liver and bone disease. The patient was started on systemic chemotherapy with docetaxel and Cyramza status post 8 cycles.  He has been  tolerating this treatment well with no concerning adverse effect except for fatigue. I recommended for him to proceed with cycle #9 today as planned. For pain management I will give the patient a refill of oxycodone in addition to Flexeril. For the neuropathic pain from the recent shingle, I will start the patient on gabapentin 100 mg p.o. 3 times daily. The patient will come back for follow-up visit in 3 weeks for evaluation with repeat CT scan of the chest, abdomen pelvis for restaging of his disease. He was advised to call immediately if he has any concerning symptoms in the interval. The patient voices understanding of current disease status  and treatment options and is in agreement with the current care plan. All questions were answered. The patient knows to call the clinic with any problems, questions or concerns. We can certainly see the patient much sooner if necessary.  Disclaimer: This note was dictated with voice recognition software. Similar sounding words can inadvertently be transcribed and may not be corrected upon review.

## 2019-02-26 NOTE — Telephone Encounter (Signed)
Scheduled appt per 7/28 los - pt to get an updated schedule next visit.

## 2019-02-27 ENCOUNTER — Telehealth: Payer: Self-pay | Admitting: *Deleted

## 2019-02-27 NOTE — Telephone Encounter (Signed)
Anthony [name]  Clinical Social [name] received phone from patient requesting counseling support for managing recent life stressors.  CSW provided active listening and encouraged patient to process stressors related to family communication, COVID pandemic, and managing chronic pain.  Patient was appreciative of session and plans to call CSW every 2-3 weeks to process and manage emotions.     [name], LCSW  Clinical Social Worker [facility]

## 2019-02-28 ENCOUNTER — Other Ambulatory Visit: Payer: Self-pay | Admitting: Medical Oncology

## 2019-02-28 ENCOUNTER — Inpatient Hospital Stay: Payer: Medicaid Other

## 2019-02-28 ENCOUNTER — Telehealth: Payer: Self-pay | Admitting: Medical Oncology

## 2019-02-28 ENCOUNTER — Other Ambulatory Visit: Payer: Self-pay

## 2019-02-28 DIAGNOSIS — C3490 Malignant neoplasm of unspecified part of unspecified bronchus or lung: Secondary | ICD-10-CM

## 2019-02-28 DIAGNOSIS — Z5112 Encounter for antineoplastic immunotherapy: Secondary | ICD-10-CM

## 2019-02-28 MED ORDER — PEGFILGRASTIM INJECTION 6 MG/0.6ML ~~LOC~~
PREFILLED_SYRINGE | SUBCUTANEOUS | Status: AC
  Filled 2019-02-28: qty 0.6

## 2019-02-28 MED ORDER — PEGFILGRASTIM INJECTION 6 MG/0.6ML ~~LOC~~
6.0000 mg | PREFILLED_SYRINGE | Freq: Once | SUBCUTANEOUS | Status: AC
  Administered 2019-02-28: 12:00:00 6 mg via SUBCUTANEOUS

## 2019-02-28 NOTE — Telephone Encounter (Signed)
Onpro device fell off 7/28 in the evening. Kaz coming in today for neulasta-schedule message sent.

## 2019-02-28 NOTE — Patient Instructions (Signed)

## 2019-03-05 ENCOUNTER — Inpatient Hospital Stay: Payer: Medicaid Other | Attending: Internal Medicine

## 2019-03-05 ENCOUNTER — Other Ambulatory Visit: Payer: Self-pay

## 2019-03-05 DIAGNOSIS — C778 Secondary and unspecified malignant neoplasm of lymph nodes of multiple regions: Secondary | ICD-10-CM | POA: Diagnosis not present

## 2019-03-05 DIAGNOSIS — B029 Zoster without complications: Secondary | ICD-10-CM | POA: Diagnosis not present

## 2019-03-05 DIAGNOSIS — I700 Atherosclerosis of aorta: Secondary | ICD-10-CM | POA: Diagnosis not present

## 2019-03-05 DIAGNOSIS — J90 Pleural effusion, not elsewhere classified: Secondary | ICD-10-CM | POA: Diagnosis not present

## 2019-03-05 DIAGNOSIS — Z79899 Other long term (current) drug therapy: Secondary | ICD-10-CM | POA: Diagnosis not present

## 2019-03-05 DIAGNOSIS — K219 Gastro-esophageal reflux disease without esophagitis: Secondary | ICD-10-CM | POA: Diagnosis not present

## 2019-03-05 DIAGNOSIS — R531 Weakness: Secondary | ICD-10-CM | POA: Insufficient documentation

## 2019-03-05 DIAGNOSIS — C3411 Malignant neoplasm of upper lobe, right bronchus or lung: Secondary | ICD-10-CM | POA: Diagnosis not present

## 2019-03-05 DIAGNOSIS — C3491 Malignant neoplasm of unspecified part of right bronchus or lung: Secondary | ICD-10-CM

## 2019-03-05 DIAGNOSIS — M25511 Pain in right shoulder: Secondary | ICD-10-CM | POA: Diagnosis not present

## 2019-03-05 DIAGNOSIS — Z5111 Encounter for antineoplastic chemotherapy: Secondary | ICD-10-CM | POA: Insufficient documentation

## 2019-03-05 DIAGNOSIS — Z7982 Long term (current) use of aspirin: Secondary | ICD-10-CM | POA: Diagnosis not present

## 2019-03-05 DIAGNOSIS — C787 Secondary malignant neoplasm of liver and intrahepatic bile duct: Secondary | ICD-10-CM | POA: Insufficient documentation

## 2019-03-05 DIAGNOSIS — I252 Old myocardial infarction: Secondary | ICD-10-CM | POA: Diagnosis not present

## 2019-03-05 DIAGNOSIS — C7951 Secondary malignant neoplasm of bone: Secondary | ICD-10-CM | POA: Insufficient documentation

## 2019-03-05 DIAGNOSIS — R5383 Other fatigue: Secondary | ICD-10-CM | POA: Insufficient documentation

## 2019-03-05 LAB — CBC WITH DIFFERENTIAL (CANCER CENTER ONLY)
Abs Immature Granulocytes: 0.02 10*3/uL (ref 0.00–0.07)
Basophils Absolute: 0 10*3/uL (ref 0.0–0.1)
Basophils Relative: 2 %
Eosinophils Absolute: 0 10*3/uL (ref 0.0–0.5)
Eosinophils Relative: 1 %
HCT: 32.8 % — ABNORMAL LOW (ref 39.0–52.0)
Hemoglobin: 10.6 g/dL — ABNORMAL LOW (ref 13.0–17.0)
Immature Granulocytes: 1 %
Lymphocytes Relative: 48 %
Lymphs Abs: 0.9 10*3/uL (ref 0.7–4.0)
MCH: 30 pg (ref 26.0–34.0)
MCHC: 32.3 g/dL (ref 30.0–36.0)
MCV: 92.9 fL (ref 80.0–100.0)
Monocytes Absolute: 0.3 10*3/uL (ref 0.1–1.0)
Monocytes Relative: 17 %
Neutro Abs: 0.6 10*3/uL — ABNORMAL LOW (ref 1.7–7.7)
Neutrophils Relative %: 31 %
Platelet Count: 212 10*3/uL (ref 150–400)
RBC: 3.53 MIL/uL — ABNORMAL LOW (ref 4.22–5.81)
RDW: 19.5 % — ABNORMAL HIGH (ref 11.5–15.5)
WBC Count: 1.9 10*3/uL — ABNORMAL LOW (ref 4.0–10.5)
nRBC: 0 % (ref 0.0–0.2)

## 2019-03-11 ENCOUNTER — Other Ambulatory Visit: Payer: Self-pay | Admitting: *Deleted

## 2019-03-11 DIAGNOSIS — G893 Neoplasm related pain (acute) (chronic): Secondary | ICD-10-CM

## 2019-03-11 MED ORDER — OXYCODONE-ACETAMINOPHEN 5-325 MG PO TABS: ORAL_TABLET | ORAL | 0 refills | Status: DC

## 2019-03-11 MED ORDER — CYCLOBENZAPRINE HCL 5 MG PO TABS: 5.0000 mg | ORAL_TABLET | Freq: Three times a day (TID) | ORAL | 0 refills | Status: DC | PRN

## 2019-03-11 NOTE — Telephone Encounter (Signed)
Pt called with request to refill Oxycodone and Flexeril to [facility] [address].  Request forward to MD for review.

## 2019-03-12 ENCOUNTER — Other Ambulatory Visit: Payer: Self-pay

## 2019-03-12 ENCOUNTER — Inpatient Hospital Stay: Payer: Medicaid Other

## 2019-03-12 DIAGNOSIS — C3490 Malignant neoplasm of unspecified part of unspecified bronchus or lung: Secondary | ICD-10-CM

## 2019-03-12 DIAGNOSIS — Z5111 Encounter for antineoplastic chemotherapy: Secondary | ICD-10-CM | POA: Diagnosis not present

## 2019-03-12 LAB — CMP (CANCER CENTER ONLY)
ALT: 16 U/L (ref 0–44)
AST: 28 U/L (ref 15–41)
Albumin: 2.9 g/dL — ABNORMAL LOW (ref 3.5–5.0)
Alkaline Phosphatase: 136 U/L — ABNORMAL HIGH (ref 38–126)
Anion gap: 12 (ref 5–15)
BUN: 18 mg/dL (ref 6–20)
CO2: 23 mmol/L (ref 22–32)
Calcium: 9.2 mg/dL (ref 8.9–10.3)
Chloride: 100 mmol/L (ref 98–111)
Creatinine: 0.74 mg/dL (ref 0.61–1.24)
GFR, Est AFR Am: >60 mL/min (ref >60)
GFR, Estimated: >60 mL/min (ref >60)
Glucose, Bld: 95 mg/dL (ref 70–99)
Potassium: 3.8 mmol/L (ref 3.5–5.1)
Sodium: 135 mmol/L (ref 135–145)
Total Bilirubin: 0.3 mg/dL (ref 0.3–1.2)
Total Protein: 6.1 g/dL — ABNORMAL LOW (ref 6.5–8.1)

## 2019-03-12 LAB — CBC WITH DIFFERENTIAL (CANCER CENTER ONLY)
Abs Immature Granulocytes: 0.13 10*3/uL — ABNORMAL HIGH (ref 0.00–0.07)
Basophils Absolute: 0.1 10*3/uL (ref 0.0–0.1)
Basophils Relative: 0 %
Eosinophils Absolute: 0 10*3/uL (ref 0.0–0.5)
Eosinophils Relative: 0 %
HCT: 36.3 % — ABNORMAL LOW (ref 39.0–52.0)
Hemoglobin: 11.3 g/dL — ABNORMAL LOW (ref 13.0–17.0)
Immature Granulocytes: 1 %
Lymphocytes Relative: 9 %
Lymphs Abs: 1.7 10*3/uL (ref 0.7–4.0)
MCH: 29.4 pg (ref 26.0–34.0)
MCHC: 31.1 g/dL (ref 30.0–36.0)
MCV: 94.3 fL (ref 80.0–100.0)
Monocytes Absolute: 1.2 10*3/uL — ABNORMAL HIGH (ref 0.1–1.0)
Monocytes Relative: 7 %
Neutro Abs: 15.5 10*3/uL — ABNORMAL HIGH (ref 1.7–7.7)
Neutrophils Relative %: 83 %
Platelet Count: 219 10*3/uL (ref 150–400)
RBC: 3.85 MIL/uL — ABNORMAL LOW (ref 4.22–5.81)
RDW: 21 % — ABNORMAL HIGH (ref 11.5–15.5)
WBC Count: 18.7 10*3/uL — ABNORMAL HIGH (ref 4.0–10.5)
nRBC: 0 % (ref 0.0–0.2)

## 2019-03-18 ENCOUNTER — Other Ambulatory Visit: Payer: Self-pay

## 2019-03-18 ENCOUNTER — Ambulatory Visit (HOSPITAL_COMMUNITY)
Admission: RE | Admit: 2019-03-18 | Discharge: 2019-03-18 | Disposition: A | Discharge status: Home / Self Care | Payer: Medicaid Other | Source: Ambulatory Visit | Attending: Internal Medicine | Admitting: Internal Medicine

## 2019-03-18 DIAGNOSIS — C3490 Malignant neoplasm of unspecified part of unspecified bronchus or lung: Secondary | ICD-10-CM | POA: Diagnosis not present

## 2019-03-18 MED ORDER — IOHEXOL 300 MG/ML  SOLN
100.0000 mL | Freq: Once | INTRAMUSCULAR | Status: AC | PRN
  Administered 2019-03-18: 100 mL via INTRAVENOUS

## 2019-03-18 MED ORDER — SODIUM CHLORIDE (PF) 0.9 % IJ SOLN
INTRAMUSCULAR | Status: AC
  Filled 2019-03-18: qty 50

## 2019-03-19 ENCOUNTER — Other Ambulatory Visit: Payer: Self-pay

## 2019-03-19 ENCOUNTER — Inpatient Hospital Stay: Payer: Medicaid Other

## 2019-03-19 ENCOUNTER — Other Ambulatory Visit: Payer: Self-pay | Admitting: Internal Medicine

## 2019-03-19 ENCOUNTER — Telehealth: Payer: Self-pay | Admitting: *Deleted

## 2019-03-19 ENCOUNTER — Encounter: Payer: Self-pay | Admitting: Internal Medicine

## 2019-03-19 ENCOUNTER — Telehealth: Payer: Self-pay | Admitting: Internal Medicine

## 2019-03-19 ENCOUNTER — Inpatient Hospital Stay (HOSPITAL_BASED_OUTPATIENT_CLINIC_OR_DEPARTMENT_OTHER): Payer: Medicaid Other | Admitting: Internal Medicine

## 2019-03-19 ENCOUNTER — Telehealth: Payer: Self-pay

## 2019-03-19 ENCOUNTER — Telehealth: Payer: Self-pay | Admitting: Medical Oncology

## 2019-03-19 VITALS — BP 102/75 | HR 83 | Temp 98.2°F | Resp 18 | Ht 70.0 in | Wt 176.0 lb

## 2019-03-19 DIAGNOSIS — Z5111 Encounter for antineoplastic chemotherapy: Secondary | ICD-10-CM | POA: Diagnosis not present

## 2019-03-19 DIAGNOSIS — G893 Neoplasm related pain (acute) (chronic): Secondary | ICD-10-CM

## 2019-03-19 DIAGNOSIS — C3490 Malignant neoplasm of unspecified part of unspecified bronchus or lung: Secondary | ICD-10-CM

## 2019-03-19 DIAGNOSIS — C7951 Secondary malignant neoplasm of bone: Secondary | ICD-10-CM | POA: Diagnosis not present

## 2019-03-19 DIAGNOSIS — C787 Secondary malignant neoplasm of liver and intrahepatic bile duct: Secondary | ICD-10-CM

## 2019-03-19 DIAGNOSIS — C3491 Malignant neoplasm of unspecified part of right bronchus or lung: Secondary | ICD-10-CM

## 2019-03-19 DIAGNOSIS — J90 Pleural effusion, not elsewhere classified: Secondary | ICD-10-CM

## 2019-03-19 LAB — CBC WITH DIFFERENTIAL (CANCER CENTER ONLY)
Abs Immature Granulocytes: 0.09 10*3/uL — ABNORMAL HIGH (ref 0.00–0.07)
Basophils Absolute: 0.1 10*3/uL (ref 0.0–0.1)
Basophils Relative: 1 %
Eosinophils Absolute: 0 10*3/uL (ref 0.0–0.5)
Eosinophils Relative: 0 %
HCT: 37.3 % — ABNORMAL LOW (ref 39.0–52.0)
Hemoglobin: 11.8 g/dL — ABNORMAL LOW (ref 13.0–17.0)
Immature Granulocytes: 1 %
Lymphocytes Relative: 7 %
Lymphs Abs: 1.1 10*3/uL (ref 0.7–4.0)
MCH: 29.9 pg (ref 26.0–34.0)
MCHC: 31.6 g/dL (ref 30.0–36.0)
MCV: 94.4 fL (ref 80.0–100.0)
Monocytes Absolute: 0.9 10*3/uL (ref 0.1–1.0)
Monocytes Relative: 6 %
Neutro Abs: 14.3 10*3/uL — ABNORMAL HIGH (ref 1.7–7.7)
Neutrophils Relative %: 85 %
Platelet Count: 263 10*3/uL (ref 150–400)
RBC: 3.95 MIL/uL — ABNORMAL LOW (ref 4.22–5.81)
RDW: 20.7 % — ABNORMAL HIGH (ref 11.5–15.5)
WBC Count: 16.6 10*3/uL — ABNORMAL HIGH (ref 4.0–10.5)
nRBC: 0 % (ref 0.0–0.2)

## 2019-03-19 LAB — CMP (CANCER CENTER ONLY)
ALT: 18 U/L (ref 0–44)
AST: 35 U/L (ref 15–41)
Albumin: 3.2 g/dL — ABNORMAL LOW (ref 3.5–5.0)
Alkaline Phosphatase: 135 U/L — ABNORMAL HIGH (ref 38–126)
Anion gap: 13 (ref 5–15)
BUN: 21 mg/dL — ABNORMAL HIGH (ref 6–20)
CO2: 23 mmol/L (ref 22–32)
Calcium: 9.4 mg/dL (ref 8.9–10.3)
Chloride: 99 mmol/L (ref 98–111)
Creatinine: 0.73 mg/dL (ref 0.61–1.24)
GFR, Est AFR Am: >60 mL/min (ref >60)
GFR, Estimated: >60 mL/min (ref >60)
Glucose, Bld: 99 mg/dL (ref 70–99)
Potassium: 4.3 mmol/L (ref 3.5–5.1)
Sodium: 135 mmol/L (ref 135–145)
Total Bilirubin: 0.4 mg/dL (ref 0.3–1.2)
Total Protein: 6.5 g/dL (ref 6.5–8.1)

## 2019-03-19 MED ORDER — CYCLOBENZAPRINE HCL 5 MG PO TABS: 5.0000 mg | ORAL_TABLET | Freq: Three times a day (TID) | ORAL | 0 refills | Status: AC | PRN

## 2019-03-19 MED ORDER — OXYCODONE-ACETAMINOPHEN 5-325 MG PO TABS: ORAL_TABLET | ORAL | 0 refills | Status: DC

## 2019-03-19 NOTE — Progress Notes (Signed)
DISCONTINUE ON PATHWAY REGIMEN - Non-Small Cell Lung     A cycle is every 21 days:     Ramucirumab      Docetaxel   **Always confirm dose/schedule in your pharmacy ordering system**  REASON: Disease Progression PRIOR TREATMENT: [id number]: Docetaxel 75 mg/m2 + Ramucirumab 10 mg/kg q21 Days Until Progression or Unacceptable Toxicity TREATMENT RESPONSE: Progressive Disease (PD)  CLINICAL TRIAL SELECTED - Non-Small Cell Lung  Other Clinical Trial: gemcit  **Trial eligibility and accrual should be confirmed by your research team**  Patient Characteristics: Stage IV Metastatic, Nonsquamous, Third Line - Chemotherapy/Immunotherapy, PS = 0, 1, Prior PD-1/PD-L1 Inhibitor or No Prior PD-1/PD-L1 Inhibitor and Not a Candidate for Immunotherapy AJCC T Category: T1b Current Disease Status: Distant Metastases AJCC N Category: N2 AJCC M Category: M1c AJCC 8 Stage Grouping: IVB Histology: Nonsquamous Cell ROS1 Rearrangement Status: Awaiting Test Results T790M Mutation Status: Not Applicable - EGFR Mutation Negative/Unknown Other Mutations/Biomarkers: No Other Actionable Mutations NTRK Gene Fusion Status: Negative PD-L1 Expression Status: PD-L1 Positive 1-49% (TPS) Chemotherapy/Immunotherapy LOT: Third Line Chemotherapy/Immunotherapy Molecular Targeted Therapy: Not Appropriate ALK Rearrangement Status: Awaiting Test Results EGFR Mutation Status: Awaiting Test Results BRAF V600E Mutation Status: Awaiting Test Results ECOG Performance Status: 1 Immunotherapy Candidate Status: Not a Candidate for Immunotherapy Prior Immunotherapy Status: Prior PD-1/PD-L1 Inhibitor Intent of Therapy: Non-Curative / Palliative Intent, Discussed with Patient

## 2019-03-19 NOTE — Progress Notes (Signed)
Inbasket sent to HIM pool  to f/u on referral to [facility].

## 2019-03-19 NOTE — Telephone Encounter (Signed)
[facility] Work  Clinical Social Work received phone call from patient reporting he just learned of cancer progression at this morning's medical oncology visit.  Patient shared in detail of cancer progession, pain, and exploration of treatment options.  CSW explored patient's fears and emotional responses.  Mr. Bisono also discussed recent stressful contact with his son/family.  CSW and patient processed recent experiences and discussed healthy communication with family.  CSW provided brief emotional support through active listening and acceptance & commitment therapy strategies.   Patient requested CSW contact patient in the next week to discuss healthy communication strategies with son and daughter once he's had time to process today's visit.    [name], LCSW  Clinical Social Worker [facility]

## 2019-03-19 NOTE — Telephone Encounter (Signed)
Message sent to patient to check in and offer to schedule a follow up visit with Palliative NP.

## 2019-03-19 NOTE — Progress Notes (Signed)
[place] [phone]   [phone]  OFFICE PROGRESS NOTE  [name], MD [address] [place] [address]  DIAGNOSIS: Stage IVB(T1b, N2, M1c)non-small cell lung cancer, adenocarcinoma presented with right upper lobe lung nodule in addition to right hilar and mediastinal lymphadenopathy as well as metastatic liver and bone disease diagnosed in February 2019.  Biomarker Findings Microsatellite status - MS-Stable Tumor Mutational Burden - TMB-Low (4 Muts/Mb) Genomic Findings For a complete list of the genes assayed, please refer to the Appendix. EGFR exon 20 insertion ([id number]) RB1 loss exons 9-17 TP53 [id number] 7 Disease relevant genes with no reportable alterations: KRAS, ALK, BRAF, MET, RET, ERBB2, ROS1  PDL 1 expression 5%  PRIOR THERAPY:  1) Palliative radiotherapy to the metastatic bone lesions in the cervical spine and left femur under the care of Dr. Sondra Come. 2) Systemic chemotherapy with carboplatin for AUC of 5, Alimta 500 mg/M2 and Keytruda 200 mg IV every 3 weeks.  First dose October 04, 2017.  Status post 3 cycles. 3) palliative stereotactic radiotherapy to the progressive liver lesion. 4)  Maintenance treatment with Alimta 500 mg/M2 and Keytruda 200 mg IV every 3 weeks.  Status post 12 cycles.  Last dose was giving August 14, 2018 discontinued secondary to disease progression. 5) Systemic chemotherapy with docetaxel 75 mg/M2 and Cyramza 10 mg/KG every 3 weeks with Neulasta support.  First dose September 11, 2018.  Status post 9 cycles.  Last cycle was given on 02/26/2019 discontinued secondary to disease progression.   CURRENT THERAPY: Systemic chemotherapy with gemcitabine 1000 mg/M2 on days 1 and 8 every 3 weeks.  First dose March 22, 2019.  INTERVAL HISTORY: Anthony [name] 48 y.o. male returns to the clinic today for follow-up visit.  The patient continues to complain of increasing fatigue and  weakness as well as pain in the right shoulder started yesterday.  He mentioned that he probably slept in the wrong position.  He also has shortness of breath with exertion with mild cough and no hemoptysis.  He denied having any fever or chills.  He has no current nausea, vomiting, diarrhea or constipation.  He has no headache or visual changes.  He denied having any significant weight loss or night sweats.  He tolerated the last cycle of his treatment well except for the generalized fatigue and weakness.  He had repeat CT scan of the chest, abdomen pelvis performed recently and he is here for evaluation and discussion of his scan results.   MEDICAL HISTORY: Past Medical History:  Diagnosis Date   Acid reflux    History of radiation therapy 09/21/17-10/04/17   C4 spine 30 Gy in 10 fractions, left femur 30 Gy in 10 fractions   Non-small cell lung cancer ([place])    NSTEMI (non-ST elevated myocardial infarction) ([place]) 06/13/2017   [name] 06/14/2017   Tailbone injury since 1993   "cracked"    ALLERGIES:  has No Known Allergies.  MEDICATIONS:  Current Outpatient Medications  Medication Sig Dispense Refill   acetaminophen (TYLENOL) 500 MG tablet Take 500-1,000 mg by mouth daily as needed for moderate pain or headache.     aspirin EC 81 MG EC tablet Take 1 tablet (81 mg total) by mouth daily.     atorvastatin (LIPITOR) 40 MG tablet Take 1 tablet (40 mg total) by mouth daily. Please make yearly appt with [name] for September for future refills. 1st attempt 90 tablet 0   bisacodyl (  DULCOLAX) 10 MG suppository Place 10 mg rectally daily as needed for moderate constipation.     cetirizine (ZYRTEC) 10 MG tablet Take 10 mg by mouth daily as needed for allergies.      cyclobenzaprine (FLEXERIL) 5 MG tablet Take 1 tablet (5 mg total) by mouth 3 (three) times daily as needed for muscle spasms. 30 tablet 0   dexamethasone (DECADRON) 4 MG tablet 2 tablet p.o. twice daily the day before, day of  and day after the chemotherapy every 3 weeks 40 tablet 0   doxepin (SINEQUAN) 10 MG/ML solution Mix 2.5 ml in 2.5 ml of water and rinse in mouth for 1 minute twice daily. Spit out solution after rinsing mouth. 120 mL 12   Ensure (ENSURE) Take 237 mLs by mouth 4 (four) times daily. "max cafe mocha"     esomeprazole (NEXIUM) 20 MG capsule Take 20 mg by mouth every other day.     gabapentin (NEURONTIN) 100 MG capsule Take 1 capsule (100 mg total) by mouth 3 (three) times daily. 90 capsule 1   lidocaine (XYLOCAINE) 2 % solution Use as directed 5 mLs in the mouth or throat as needed for mouth pain. Rinse and spit (Patient not taking: Reported on 02/26/2019) 200 mL 3   lisinopril (ZESTRIL) 5 MG tablet TAKE 1 TABLET BY MOUTH EVERY DAY 30 tablet 2   magnesium citrate SOLN Take 1 Bottle by mouth once as needed for severe constipation.     magnesium hydroxide (MILK OF MAGNESIA) 400 MG/5ML suspension Take 30 mLs by mouth daily as needed for mild constipation.     metoprolol tartrate (LOPRESSOR) 25 MG tablet TAKE 1/2 TABLET BY MOUTH 2 TIMES DAILY. 90 tablet 0   oxyCODONE-acetaminophen (PERCOCET/ROXICET) 5-325 MG tablet TAKE 1 TABLET BY MOUTH EVERY 6 HOURS AS NEEDED FOR SEVERE PAIN. 30 tablet 0   prochlorperazine (COMPAZINE) 10 MG tablet Take 1 tablet (10 mg total) by mouth every 6 (six) hours as needed for nausea or vomiting. 30 tablet 1   No current facility-administered medications for this visit.    Facility-Administered Medications Ordered in Other Visits  Medication Dose Route Frequency Provider Last Rate Last Dose   sodium chloride flush (NS) 0.9 % injection 10 mL  10 mL Intravenous PRN [name], MD        SURGICAL HISTORY:  Past Surgical History:  Procedure Laterality Date   CARDIAC CATHETERIZATION  06/14/2017   CHOLECYSTECTOMY N/A 12/29/2017   Procedure: LAPAROSCOPIC CHOLECYSTECTOMY;  Surgeon: Ralene Ok, MD;  Location: [facility];  Service: General;  Laterality: N/A;    CHOLECYSTECTOMY, LAPAROSCOPIC N/A 12/29/2017   CORONARY ARTERY BYPASS GRAFT N/A 06/19/2017   Procedure: CORONARY ARTERY BYPASS GRAFTING (CABG), OFF PUMP, times one using the left internal mammary artery to LAD;  Surgeon: Grace Isaac, MD;  Location: [facility];  Service: Open Heart Surgery;  Laterality: N/A;   ESOPHAGOGASTRODUODENOSCOPY (EGD) WITH PROPOFOL N/A 11/30/2017   Procedure: ESOPHAGOGASTRODUODENOSCOPY (EGD) WITH PROPOFOL;  Surgeon: Gatha Mayer, MD;  Location: [facility];  Service: Endoscopy;  Laterality: N/A;   LEFT HEART CATH AND CORONARY ANGIOGRAPHY N/A 06/14/2017   Procedure: LEFT HEART CATH AND CORONARY ANGIOGRAPHY;  Surgeon: Sherren Mocha, MD;  Location: [facility];  Service: Cardiovascular;  Laterality: N/A;   TEE WITHOUT CARDIOVERSION N/A 06/19/2017   Procedure: TRANSESOPHAGEAL ECHOCARDIOGRAM (TEE);  Surgeon: Grace Isaac, MD;  Location: [facility];  Service: Open Heart Surgery;  Laterality: N/A;   TONSILLECTOMY  ~ 1977    REVIEW  OF SYSTEMS:  Constitutional: positive for fatigue Eyes: negative Ears, nose, mouth, throat, and face: negative Respiratory: positive for cough and dyspnea on exertion Cardiovascular: negative Gastrointestinal: negative Genitourinary:negative Integument/breast: negative Hematologic/lymphatic: negative Musculoskeletal:negative Neurological: negative Behavioral/Psych: negative Endocrine: negative Allergic/Immunologic: negative   PHYSICAL EXAMINATION: General appearance: alert, cooperative, fatigued and no distress Head: Normocephalic, without obvious abnormality, atraumatic Neck: no adenopathy, no JVD, supple, symmetrical, trachea midline and thyroid not enlarged, symmetric, no tenderness/mass/nodules Lymph nodes: Cervical, supraclavicular, and axillary nodes normal. Resp: diminished breath sounds LLL and RLL and dullness to percussion LLL and RLL Back: symmetric, no curvature. ROM normal. No CVA tenderness. Cardio: regular  rate and rhythm, S1, S2 normal, no murmur, click, rub or gallop GI: soft, non-tender; bowel sounds normal; no masses,  no organomegaly Extremities: extremities normal, atraumatic, no cyanosis or edema Neurologic: Alert and oriented X 3, normal strength and tone. Normal symmetric reflexes. Normal coordination and gait  ECOG PERFORMANCE STATUS: 1 - Symptomatic but completely ambulatory  Blood pressure 102/75, pulse 83, temperature 98.2 F (36.8 C), temperature source Oral, resp. rate 18, height '5\' 10"'$  (1.778 m), weight 176 lb (79.8 kg), SpO2 98 %.  LABORATORY DATA: Lab Results  Component Value Date   WBC 18.7 (H) 03/12/2019   HGB 11.3 (L) 03/12/2019   HCT 36.3 (L) 03/12/2019   MCV 94.3 03/12/2019   PLT 219 03/12/2019      Chemistry      Component Value Date/Time   NA 135 03/12/2019 1319   NA 141 07/17/2017 1006   K 3.8 03/12/2019 1319   CL 100 03/12/2019 1319   CO2 23 03/12/2019 1319   BUN 18 03/12/2019 1319   BUN 12 07/17/2017 1006   CREATININE 0.74 03/12/2019 1319      Component Value Date/Time   CALCIUM 9.2 03/12/2019 1319   ALKPHOS 136 (H) 03/12/2019 1319   AST 28 03/12/2019 1319   ALT 16 03/12/2019 1319   BILITOT 0.3 03/12/2019 1319        RADIOGRAPHIC STUDIES: Ct Chest W Contrast  Result Date: 03/18/2019 CLINICAL DATA:  History of lung cancer diagnosed in 2019. Chemotherapy in progress. Radiation therapy complete. EXAM: CT CHEST, ABDOMEN, AND PELVIS WITH CONTRAST TECHNIQUE: Multidetector CT imaging of the chest, abdomen and pelvis was performed following the standard protocol during bolus administration of intravenous contrast. CONTRAST:  162m OMNIPAQUE IOHEXOL 300 MG/ML  SOLN COMPARISON:  01/14/2019 FINDINGS: CT CHEST FINDINGS Cardiovascular: Aortic atherosclerosis. Tortuous thoracic aorta. Normal heart size, without pericardial effusion. Median sternotomy for CABG. No central pulmonary embolism, on this non-dedicated study. Mediastinum/Nodes: No supraclavicular  adenopathy. No hilar adenopathy. Favor minimal fluid versus less likely a lymph node adjacent the esophagus on 47/2, new. Lungs/Pleura: Moderate right pleural effusion, increased. Small to moderate left pleural effusion, increased. Minimal motion degradation in the lower chest. Right lower lobe volume loss and interstitial thickening, increased. No well-defined residual right lower lobe mass. Irregular right upper lobe nodule on the order of 1.3 cm on 64/6 is felt to be similar. Pleural-based left upper lobe pulmonary nodule is new at 5 mm on 38/6. More central 3 mm left upper lobe pulmonary nodule on 72/6, new. New left lower lobe pulmonary nodule of 8 mm on 90/6. Musculoskeletal: Osseous metastasis. Index lucent lesion within the T1 vertebral body is eccentric right and similar in size at 8 mm. CT ABDOMEN PELVIS FINDINGS Hepatobiliary: The segment 4A liver lesion measures 9 mm on 46/2 versus 13 mm on the prior. However, there has been development of  innumerable small bilateral liver lesions, consistent with diffuse metastasis. Capsular based high right hepatic lobe lesion measures 3.2 x 2.0 cm on 46/2. Cholecystectomy, without biliary ductal dilatation. Pancreas: Normal, without mass or ductal dilatation. Spleen: Normal in size, without focal abnormality. Adrenals/Urinary Tract: Normal adrenal glands. Heterogeneous renal enhancement bilaterally. This is most conspicuous on kidney delayed images. No hydronephrosis. Normal urinary bladder. Stomach/Bowel: Normal stomach, without wall thickening. Colonic stool burden suggests constipation. Normal small bowel. Vascular/Lymphatic: Aortic atherosclerosis. No abdominopelvic adenopathy. Reproductive: Normal prostate. Other: Small volume abdominopelvic fluid, minimally increased. No well-defined omental/peritoneal metastasis. Musculoskeletal: Superior endplate mild compression deformity at L 2 is likely due to an underlying pathologic fracture and is similar. Similar  eccentric right L4 lytic metastasis. IMPRESSION: CT CHEST IMPRESSION 1. Development of left-sided pulmonary nodules, consistent with metastatic disease. 2. Increased right larger than left pleural effusions. 3.  Aortic Atherosclerosis (ICD10-I70.0). 4. Similar osseous metastasis. CT ABDOMEN AND PELVIS IMPRESSION 1. Progressive hepatic metastasis. 2. Similar osseous metastasis. 3.  Possible constipation. 4. Slight increase in small volume abdominopelvic fluid. Electronically Signed   By: Abigail Miyamoto M.D.   On: 03/18/2019 12:43   Ct Abdomen Pelvis W Contrast  Result Date: 03/18/2019 CLINICAL DATA:  History of lung cancer diagnosed in 2019. Chemotherapy in progress. Radiation therapy complete. EXAM: CT CHEST, ABDOMEN, AND PELVIS WITH CONTRAST TECHNIQUE: Multidetector CT imaging of the chest, abdomen and pelvis was performed following the standard protocol during bolus administration of intravenous contrast. CONTRAST:  158m OMNIPAQUE IOHEXOL 300 MG/ML  SOLN COMPARISON:  01/14/2019 FINDINGS: CT CHEST FINDINGS Cardiovascular: Aortic atherosclerosis. Tortuous thoracic aorta. Normal heart size, without pericardial effusion. Median sternotomy for CABG. No central pulmonary embolism, on this non-dedicated study. Mediastinum/Nodes: No supraclavicular adenopathy. No hilar adenopathy. Favor minimal fluid versus less likely a lymph node adjacent the esophagus on 47/2, new. Lungs/Pleura: Moderate right pleural effusion, increased. Small to moderate left pleural effusion, increased. Minimal motion degradation in the lower chest. Right lower lobe volume loss and interstitial thickening, increased. No well-defined residual right lower lobe mass. Irregular right upper lobe nodule on the order of 1.3 cm on 64/6 is felt to be similar. Pleural-based left upper lobe pulmonary nodule is new at 5 mm on 38/6. More central 3 mm left upper lobe pulmonary nodule on 72/6, new. New left lower lobe pulmonary nodule of 8 mm on 90/6.  Musculoskeletal: Osseous metastasis. Index lucent lesion within the T1 vertebral body is eccentric right and similar in size at 8 mm. CT ABDOMEN PELVIS FINDINGS Hepatobiliary: The segment 4A liver lesion measures 9 mm on 46/2 versus 13 mm on the prior. However, there has been development of innumerable small bilateral liver lesions, consistent with diffuse metastasis. Capsular based high right hepatic lobe lesion measures 3.2 x 2.0 cm on 46/2. Cholecystectomy, without biliary ductal dilatation. Pancreas: Normal, without mass or ductal dilatation. Spleen: Normal in size, without focal abnormality. Adrenals/Urinary Tract: Normal adrenal glands. Heterogeneous renal enhancement bilaterally. This is most conspicuous on kidney delayed images. No hydronephrosis. Normal urinary bladder. Stomach/Bowel: Normal stomach, without wall thickening. Colonic stool burden suggests constipation. Normal small bowel. Vascular/Lymphatic: Aortic atherosclerosis. No abdominopelvic adenopathy. Reproductive: Normal prostate. Other: Small volume abdominopelvic fluid, minimally increased. No well-defined omental/peritoneal metastasis. Musculoskeletal: Superior endplate mild compression deformity at L 2 is likely due to an underlying pathologic fracture and is similar. Similar eccentric right L4 lytic metastasis. IMPRESSION: CT CHEST IMPRESSION 1. Development of left-sided pulmonary nodules, consistent with metastatic disease. 2. Increased right larger than left pleural effusions. 3.  Aortic Atherosclerosis (ICD10-I70.0). 4. Similar osseous metastasis. CT ABDOMEN AND PELVIS IMPRESSION 1. Progressive hepatic metastasis. 2. Similar osseous metastasis. 3.  Possible constipation. 4. Slight increase in small volume abdominopelvic fluid. Electronically Signed   By: Abigail Miyamoto M.D.   On: 03/18/2019 12:43    ASSESSMENT AND PLAN: This is a very pleasant 48 years old white male with stage IV non-small cell lung cancer, adenocarcinoma with resistant  EGFR mutation in exon 20 and PDL 1 expression of 5%. The patient completed a course of palliative radiotherapy to the cervical spine as well as left hip under the care of Dr. Sondra Come.Marland Kitchen He completed induction systemic chemotherapy with carboplatin, Alimta and Keytruda status post 3 cycles.  He has no evidence for disease progression after the induction phase of his treatment. The patient was started on maintenance treatment with Alimta and Ketruda (pembrolizumab) status post 12 cycles.  He has been tolerating this treatment well with no concerning complaints.  He had repeat CT scan of the chest, abdomen and pelvis performed recently.  I personally and independently reviewed the scan images and discussed the results with the patient and his friend today. Unfortunately his scan showed significant evidence for disease progression with enlargement of right lung mass as well as pulmonary nodules in addition to mediastinal lymphadenopathy as well as liver and bone disease. The patient was started on systemic chemotherapy with docetaxel and Cyramza status post 9 cycles.  He has been tolerating this treatment well except for the fatigue and weakness. Patient had repeat CT scan of the chest, abdomen pelvis performed recently.  I personally and independently reviewed the scan images and discussed the results with the patient today. Unfortunately his a scan showed evidence for disease progression with new pulmonary nodules in addition to enlarging pleural effusion and progressive hepatic metastasis. I recommended for the patient to discontinue his current treatment with docetaxel and Cyramza at this point. I discussed with the patient several options for management of his condition including palliative care and hospice referral versus consideration of systemic chemotherapy with single agent gemcitabine 1000 mg/M2 on days 1 and 8 every 3 weeks versus looking for clinical trial for EGFR exon 20 mutation. The patient is  interested in looking for a clinical trial but in the meantime he would continue on systemic chemotherapy with gemcitabine. I discussed with the patient the adverse effect of this treatment. He is expected to start the first cycle of this treatment next week unless he is eligible for a clinical trial for the EGFR exon 20 mutation. For the enlarging pleural effusion, I will arrange for the patient to have ultrasound-guided right thoracentesis. For pain management I will give the patient a refill of oxycodone in addition to Flexeril. For the neuropathic pain from the recent shingle, I will start the patient on gabapentin 100 mg p.o. 3 times daily. I will see him back for follow-up visit in 4 weeks for evaluation before starting cycle #2 of his treatment. He was advised to call immediately if he has any concerning symptoms in the interval. The patient voices understanding of current disease status and treatment options and is in agreement with the current care plan. All questions were answered. The patient knows to call the clinic with any problems, questions or concerns. We can certainly see the patient much sooner if necessary.  Disclaimer: This note was dictated with voice recognition software. Similar sounding words can inadvertently be transcribed and may not be corrected upon review.

## 2019-03-19 NOTE — Telephone Encounter (Signed)
[name] called and is concerned for pt and his emotional  relationships with children ( his son ran away) and for his ongoing care. She and her husband are his only friends and are his "family". [name] is concerned that Anthony is not realistic-he thinks if he exercises he will get stronger and he wants to travel.  He did tell her he has a lot to think about after today's appt. She would appreciate any suggestions on how she can help him.    He spoke to palliative care today .

## 2019-03-19 NOTE — Telephone Encounter (Signed)
Scheduled appt per 8/18 los - pt aware of appt date and time

## 2019-03-21 ENCOUNTER — Ambulatory Visit (HOSPITAL_COMMUNITY)
Admission: RE | Admit: 2019-03-21 | Discharge: 2019-03-21 | Disposition: A | Discharge status: Home / Self Care | Payer: Medicaid Other | Source: Ambulatory Visit | Attending: Internal Medicine | Admitting: Internal Medicine

## 2019-03-21 ENCOUNTER — Other Ambulatory Visit: Payer: Self-pay | Admitting: Medical Oncology

## 2019-03-21 ENCOUNTER — Telehealth: Payer: Self-pay | Admitting: Medical Oncology

## 2019-03-21 DIAGNOSIS — Z01812 Encounter for preprocedural laboratory examination: Secondary | ICD-10-CM | POA: Diagnosis present

## 2019-03-21 DIAGNOSIS — Z20828 Contact with and (suspected) exposure to other viral communicable diseases: Secondary | ICD-10-CM | POA: Diagnosis not present

## 2019-03-21 LAB — SARS CORONAVIRUS 2 (TAT 6-24 HRS): SARS Coronavirus 2: NEGATIVE

## 2019-03-21 NOTE — Telephone Encounter (Signed)
Pt instructed to go for covid testing at [facility].

## 2019-03-21 NOTE — Progress Notes (Signed)
Pt instructed location of COvid test.

## 2019-03-22 ENCOUNTER — Other Ambulatory Visit: Payer: Self-pay | Admitting: Physician Assistant

## 2019-03-22 ENCOUNTER — Ambulatory Visit (HOSPITAL_COMMUNITY)
Admission: RE | Admit: 2019-03-22 | Discharge: 2019-03-22 | Disposition: A | Discharge status: Home / Self Care | Payer: Medicaid Other | Source: Ambulatory Visit | Attending: Internal Medicine | Admitting: Internal Medicine

## 2019-03-22 ENCOUNTER — Encounter: Payer: Self-pay | Admitting: *Deleted

## 2019-03-22 ENCOUNTER — Telehealth: Payer: Self-pay | Admitting: Medical Oncology

## 2019-03-22 ENCOUNTER — Other Ambulatory Visit: Payer: Self-pay

## 2019-03-22 ENCOUNTER — Other Ambulatory Visit: Payer: Self-pay | Admitting: Internal Medicine

## 2019-03-22 ENCOUNTER — Other Ambulatory Visit: Payer: Self-pay | Admitting: Medical Oncology

## 2019-03-22 ENCOUNTER — Inpatient Hospital Stay: Payer: Medicaid Other

## 2019-03-22 DIAGNOSIS — I959 Hypotension, unspecified: Secondary | ICD-10-CM

## 2019-03-22 DIAGNOSIS — C7951 Secondary malignant neoplasm of bone: Secondary | ICD-10-CM

## 2019-03-22 DIAGNOSIS — C3491 Malignant neoplasm of unspecified part of right bronchus or lung: Secondary | ICD-10-CM | POA: Insufficient documentation

## 2019-03-22 DIAGNOSIS — Z5111 Encounter for antineoplastic chemotherapy: Secondary | ICD-10-CM | POA: Diagnosis not present

## 2019-03-22 MED ORDER — LIDOCAINE HCL 1 % IJ SOLN
INTRAMUSCULAR | Status: AC
  Filled 2019-03-22: qty 20

## 2019-03-22 MED ORDER — SODIUM CHLORIDE 0.9 % IV SOLN
Freq: Once | INTRAVENOUS | Status: AC
  Administered 2019-03-22: 11:00:00 via INTRAVENOUS
  Filled 2019-03-22: qty 250

## 2019-03-22 MED ORDER — GABAPENTIN 100 MG PO CAPS: 100.0000 mg | ORAL_CAPSULE | Freq: Three times a day (TID) | ORAL | 2 refills | Status: AC

## 2019-03-22 NOTE — Progress Notes (Signed)
Ok per MD Mohamed to increase pt's IVF rate from 557ml/hr to 731ml/hr.  Pt received 1L IVF NS, tolerated well.  VSS.  Reports feeling better at end of infusion.  Desk RN Diane spoke with pt about plan of care/upcoming appts.

## 2019-03-22 NOTE — Progress Notes (Signed)
Anthony [name] in radiology called. Thoracentesis cancelled due to hypotension . Pleural Fluid is loculated and Dr Geroge Baseman recommends cardiothoracic evaluate pt. Mohamed ordered IVF for today and told IR to send pt over.

## 2019-03-22 NOTE — Progress Notes (Signed)
Patient ID: Anthony [name], male   DOB: 09-16-1970, 48 y.o.   MRN: [id number] Pt presented to [place] dept today for right thoracentesis; limited US rt post chest shows sig multiloculated mod rt effusion. Pt's BP fluctuating with several systolic readings in upper 80's. He took narcotic and muscle relaxant this am and has not eaten recently. Case d/w Drs. McCullough/Mohamed. Will cancel procedure for now due to hypotension. Pt to see Dr. [name] today. He may also benefit from [facility] consult in view of multiloculated effusion.

## 2019-03-22 NOTE — Telephone Encounter (Addendum)
Pt hypotensive , thoracentesis cancelled . Pleural fluid loculated and Dr Geroge Baseman recommends referral to cardiothoracic surgeon for evaluation for catheter. appt today for IVF -Schedule message sent and orders entered sign and held. Dr [name] referred pt to [facility].

## 2019-03-22 NOTE — Progress Notes (Signed)
Oncology Nurse Navigator Documentation  Oncology Nurse Navigator Flowsheets 03/22/2019  Navigator Location [facility]  Navigator Encounter Type Other/per Dr. [name], I made referral to [facility] for patient to get pleur x cath.  I also called [facility] with an update on referral.   Telephone -  Abnormal Finding Date -  Confirmed Diagnosis Date -  Multidisiplinary Clinic Date -  Treatment Initiated Date -  Patient Visit Type -  Treatment Phase Treatment  Barriers/Navigation Needs Coordination of Care  Education -  Interventions Coordination of Care  Coordination of Care Other  Education Method -  Acuity Level 2  Acuity Level 1 -  Acuity Level 2 -  Time Spent with Patient 30

## 2019-03-22 NOTE — Progress Notes (Signed)
Pt requests refill Gabapentin . He is taking it 3-4 times a day per Dr Worthy Flank instruction.

## 2019-03-22 NOTE — Patient Instructions (Signed)

## 2019-03-25 ENCOUNTER — Ambulatory Visit
Admission: RE | Admit: 2019-03-25 | Discharge: 2019-03-25 | Disposition: A | Discharge status: Home / Self Care | Payer: Medicaid Other | Source: Ambulatory Visit | Attending: Cardiothoracic Surgery | Admitting: Cardiothoracic Surgery

## 2019-03-25 ENCOUNTER — Telehealth: Payer: Self-pay | Admitting: Medical Oncology

## 2019-03-25 ENCOUNTER — Other Ambulatory Visit (HOSPITAL_COMMUNITY)
Admission: RE | Admit: 2019-03-25 | Discharge: 2019-03-25 | Disposition: A | Discharge status: Home / Self Care | Payer: Medicaid Other | Source: Ambulatory Visit | Attending: Cardiothoracic Surgery | Admitting: Cardiothoracic Surgery

## 2019-03-25 ENCOUNTER — Encounter: Payer: Self-pay | Admitting: Cardiothoracic Surgery

## 2019-03-25 ENCOUNTER — Other Ambulatory Visit: Payer: Self-pay

## 2019-03-25 ENCOUNTER — Other Ambulatory Visit: Payer: Self-pay | Admitting: *Deleted

## 2019-03-25 ENCOUNTER — Institutional Professional Consult (permissible substitution) (INDEPENDENT_AMBULATORY_CARE_PROVIDER_SITE_OTHER): Payer: Medicaid Other | Admitting: Cardiothoracic Surgery

## 2019-03-25 ENCOUNTER — Telehealth: Payer: Self-pay | Admitting: Internal Medicine

## 2019-03-25 VITALS — BP 112/74 | HR 93 | Temp 97.7°F | Resp 16 | Ht 70.0 in | Wt 176.0 lb

## 2019-03-25 DIAGNOSIS — C3491 Malignant neoplasm of unspecified part of right bronchus or lung: Secondary | ICD-10-CM | POA: Diagnosis not present

## 2019-03-25 DIAGNOSIS — J90 Pleural effusion, not elsewhere classified: Secondary | ICD-10-CM

## 2019-03-25 DIAGNOSIS — Z20828 Contact with and (suspected) exposure to other viral communicable diseases: Secondary | ICD-10-CM | POA: Insufficient documentation

## 2019-03-25 DIAGNOSIS — Z01812 Encounter for preprocedural laboratory examination: Secondary | ICD-10-CM | POA: Diagnosis present

## 2019-03-25 DIAGNOSIS — R918 Other nonspecific abnormal finding of lung field: Secondary | ICD-10-CM

## 2019-03-25 DIAGNOSIS — C7951 Secondary malignant neoplasm of bone: Secondary | ICD-10-CM

## 2019-03-25 NOTE — Progress Notes (Signed)
[address]       [place] [address]             [phone]                    Demar K Glauser [place] Medical Record #[id number] Date of Birth: Jan 14, 1971  Referring: [name],* Primary Care: [name], MD Primary Cardiologist: Sinclair Grooms, MD  Chief Complaint:    Chief Complaint  Patient presents with   Pleural Effusion    recurrent Righr...eval for  PleurX    History of Present Illness:    Anthony [name] 48 y.o. male is seen in the office  today for evaluation for placement of a right Pleurx catheter.  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  He was to have a follow-up thoracentesis several days ago but when he got to radiology he was hypotensive in the 80s and any intervention was canceled.  He was referred to the surgery office to consider placement of Pleurx catheter.  The patient has had evidence of progression of disease, notes that tomorrow he is to start new chemo but is unsure what it is.  Current Activity/ Functional Status:  Patient is independent with mobility/ambulation, transfers, ADL's, IADL's.   Zubrod Score: At the time of surgery this patients most appropriate activity status/level should be described as: []     0    Normal activity, no symptoms [x]     1    Restricted in physical strenuous activity but ambulatory, able to do out light work []     2    Ambulatory and capable of self care, unable to do work activities, up and about               >50 % of waking hours                              []     3    Only limited self care, in bed greater than 50% of waking hours []     4    Completely disabled, no self care, confined  to bed or chair []     5    Moribund   Past Medical History:  Diagnosis Date   Acid reflux    History of radiation therapy 09/21/17-10/04/17   C4 spine 30 Gy in 10 fractions, left femur 30 Gy in 10 fractions   Non-small cell lung cancer ([place])    NSTEMI (non-ST elevated myocardial infarction) ([place]) 06/13/2017   [name] 06/14/2017   Tailbone injury since 1993   "cracked"    Past Surgical History:  Procedure Laterality Date   CARDIAC CATHETERIZATION  06/14/2017   CHOLECYSTECTOMY N/A 12/29/2017   Procedure: LAPAROSCOPIC CHOLECYSTECTOMY;  Surgeon: Ralene Ok, MD;  Location: [facility];  Service: General;  Laterality: N/A;   CHOLECYSTECTOMY, LAPAROSCOPIC N/A 12/29/2017   CORONARY ARTERY BYPASS GRAFT N/A 06/19/2017   Procedure: CORONARY ARTERY BYPASS GRAFTING (CABG), OFF PUMP, times one using the left internal mammary artery to LAD;  Surgeon: Grace Isaac, MD;  Location: [facility];  Service: Open Heart Surgery;  Laterality: N/A;  ESOPHAGOGASTRODUODENOSCOPY (EGD) WITH PROPOFOL N/A 11/30/2017   Procedure: ESOPHAGOGASTRODUODENOSCOPY (EGD) WITH PROPOFOL;  Surgeon: Gatha Mayer, MD;  Location: [facility];  Service: Endoscopy;  Laterality: N/A;   LEFT HEART CATH AND CORONARY ANGIOGRAPHY N/A 06/14/2017   Procedure: LEFT HEART CATH AND CORONARY ANGIOGRAPHY;  Surgeon: Sherren Mocha, MD;  Location: [facility];  Service: Cardiovascular;  Laterality: N/A;   TEE WITHOUT CARDIOVERSION N/A 06/19/2017   Procedure: TRANSESOPHAGEAL ECHOCARDIOGRAM (TEE);  Surgeon: Grace Isaac, MD;  Location: [facility];  Service: Open Heart Surgery;  Laterality: N/A;   TONSILLECTOMY  ~ 1977    Family History  Problem Relation Age of Onset   Alcohol abuse Mother    Heart disease Father    Arrhythmia Father    Aneurysm Maternal Grandmother      Social History   Tobacco Use  Smoking Status Never Smoker  Smokeless Tobacco Former Systems developer   Types: Snuff    Social History   Substance  and Sexual Activity  Alcohol Use Yes   Comment: 06/16/2017 "used to drink alot; pretty much stopped~ 01/2015; will have a couple drinks/month now"     No Known Allergies  Current Outpatient Medications  Medication Sig Dispense Refill   acetaminophen (TYLENOL) 500 MG tablet Take 500-1,000 mg by mouth daily as needed for moderate pain or headache.     aspirin EC 81 MG EC tablet Take 1 tablet (81 mg total) by mouth daily.     atorvastatin (LIPITOR) 40 MG tablet Take 1 tablet (40 mg total) by mouth daily. Please make yearly appt with [name] for September for future refills. 1st attempt 90 tablet 0   bisacodyl (DULCOLAX) 10 MG suppository Place 10 mg rectally daily as needed for moderate constipation.     cetirizine (ZYRTEC) 10 MG tablet Take 10 mg by mouth daily as needed for allergies.      cyclobenzaprine (FLEXERIL) 5 MG tablet Take 1 tablet (5 mg total) by mouth 3 (three) times daily as needed for muscle spasms. 30 tablet 0   doxepin (SINEQUAN) 10 MG/ML solution Mix 2.5 ml in 2.5 ml of water and rinse in mouth for 1 minute twice daily. Spit out solution after rinsing mouth. (Patient taking differently: See admin instructions. Mix 2.5 ml in 2.5 ml of water and rinse in mouth for 1 minute twice daily as needed. Spit out solution after rinsing mouth.) 120 mL 12   Ensure (ENSURE) Take 237 mLs by mouth 4 (four) times daily. "max cafe mocha"     esomeprazole (NEXIUM) 20 MG capsule Take 20 mg by mouth every other day.     gabapentin (NEURONTIN) 100 MG capsule Take 1 capsule (100 mg total) by mouth 3 (three) times daily. 90 capsule 2   lidocaine (XYLOCAINE) 2 % solution Use as directed 5 mLs in the mouth or throat as needed for mouth pain. Rinse and spit 200 mL 3   lisinopril (ZESTRIL) 5 MG tablet TAKE 1 TABLET BY MOUTH EVERY DAY 30 tablet 2   metoprolol tartrate (LOPRESSOR) 25 MG tablet TAKE 1/2 TABLET BY MOUTH 2 TIMES DAILY. (Patient taking differently: Take 12.5 mg by mouth 2 (two)  times daily. ) 90 tablet 0   oxyCODONE-acetaminophen (PERCOCET/ROXICET) 5-325 MG tablet TAKE 1 TABLET BY MOUTH EVERY 6 HOURS AS NEEDED FOR SEVERE PAIN. 30 tablet 0   Pediatric Multivitamins-Iron (FLINTSTONES COMPLETE PO) Take 1 tablet by mouth daily.     No current facility-administered medications for this visit.    Facility-Administered  Medications Ordered in Other Visits  Medication Dose Route Frequency Provider Last Rate Last Dose   sodium chloride flush (NS) 0.9 % injection 10 mL  10 mL Intravenous PRN [name], MD          Review of Systems:     Cardiac Review of Systems: [Y] = yes  or   [ N ] = no   Chest Pain [  y  ]  Resting SOB Blue.Reese   ] Exertional SOB  [ y ]  Orthopnea [  ]   Pedal Edema [   ]    Palpitations [  ] Syncope  [  ]   Presyncope [   ]   General Review of Systems: [Y] = yes [  ]=no Constitional: recent weight change [ y ];  Wt loss over the last 3 months [   ] anorexia [  ]; fatigue [  ]; nausea [  ]; night sweats [  ]; fever [  ]; or chills [  ];           Eye : blurred vision [  ]; diplopia [   ]; vision changes [  ];  Amaurosis fugax[  ]; Resp: cough [  ];  wheezing[  ];  hemoptysis[  ]; shortness of breath[  ]; paroxysmal nocturnal dyspnea[  ]; dyspnea on exertion[  ]; or orthopnea[  ];  GI:  gallstones[  ], vomiting[  ];  dysphagia[  ]; melena[  ];  hematochezia [  ]; heartburn[  ];   Hx of  Colonoscopy[  ]; GU: kidney stones [  ]; hematuria[  ];   dysuria [  ];  nocturia[  ];  history of     obstruction [  ]; urinary frequency [  ]             Skin: rash, swelling[  ];, hair loss[  ];  peripheral edema[  ];  or itching[  ]; Musculosketetal: myalgias[  ];  joint swelling[  ];  joint erythema[  ];  joint pain[  ];  back pain[  ];  Heme/Lymph: bruising[  ];  bleeding[  ];  anemia[  ];  Neuro: TIA[  ];  headaches[  ];  stroke[  ];  vertigo[  ];  seizures[  ];   paresthesias[  ];  difficulty walking[  ];  Psych:depression[  ]; anxiety[  ];  Endocrine:  diabetes[  ];  thyroid dysfunction[  ];  Immunizations: Flu up to date [  ]; Pneumococcal up to date [  ];  Other:     PHYSICAL EXAMINATION: BP 112/74 (BP Location: Left Arm, Patient Position: Sitting, Cuff Size: Normal)    Pulse 93    Temp 97.7 F (36.5 C)    Resp 16    Ht 5\' 10"  (1.778 m)    Wt 176 lb (79.8 kg)    SpO2 94% Comment: RA   BMI 25.25 kg/m  General appearance: alert, cooperative, appears older than stated age and no distress Head: Normocephalic, without obvious abnormality, atraumatic Neck: no adenopathy, no carotid bruit, no JVD, supple, symmetrical, trachea midline and thyroid not enlarged, symmetric, no tenderness/mass/nodules Lymph nodes: Cervical, supraclavicular, and axillary nodes normal. Resp: diminished breath sounds RML and RUL Back: symmetric, no curvature. ROM normal. No CVA tenderness. Cardio: regular rate and rhythm, S1, S2 normal, no murmur, click, rub or gallop GI: soft, non-tender; bowel sounds normal; no masses,  no organomegaly Extremities: extremities normal, atraumatic, no cyanosis  or edema and Homans sign is negative, no sign of DVT Neurologic: Grossly normal  Diagnostic Studies & Laboratory data:     Recent Radiology Findings:   Dg Chest 2 View  Result Date: 03/25/2019 CLINICAL DATA:  History of lung cancer. Recurrent effusions. Shortness of breath. EXAM: CHEST - 2 VIEW COMPARISON:  CT 03/18/2019.  Chest x-ray 09/14/2018. FINDINGS: Prior median sternotomy. Heart size stable. Previous identified pulmonary nodule in the left lung best identified by prior chest CT. Large right-sided pleural effusion again noted. Right pleural effusion has increased slightly from prior chest x-ray of 09/14/2018. No pneumothorax. IMPRESSION: Large right pleural effusion again noted. Pleural effusion has increased slightly from prior study of 09/14/2018. Electronically Signed   By: Marcello Moores  Register   On: 03/25/2019 12:49   Ct Chest W Contrast  Result Date:  03/18/2019 CLINICAL DATA:  History of lung cancer diagnosed in 2019. Chemotherapy in progress. Radiation therapy complete. EXAM: CT CHEST, ABDOMEN, AND PELVIS WITH CONTRAST TECHNIQUE: Multidetector CT imaging of the chest, abdomen and pelvis was performed following the standard protocol during bolus administration of intravenous contrast. CONTRAST:  153mL OMNIPAQUE IOHEXOL 300 MG/ML  SOLN COMPARISON:  01/14/2019 FINDINGS: CT CHEST FINDINGS Cardiovascular: Aortic atherosclerosis. Tortuous thoracic aorta. Normal heart size, without pericardial effusion. Median sternotomy for CABG. No central pulmonary embolism, on this non-dedicated study. Mediastinum/Nodes: No supraclavicular adenopathy. No hilar adenopathy. Favor minimal fluid versus less likely a lymph node adjacent the esophagus on 47/2, new. Lungs/Pleura: Moderate right pleural effusion, increased. Small to moderate left pleural effusion, increased. Minimal motion degradation in the lower chest. Right lower lobe volume loss and interstitial thickening, increased. No well-defined residual right lower lobe mass. Irregular right upper lobe nodule on the order of 1.3 cm on 64/6 is felt to be similar. Pleural-based left upper lobe pulmonary nodule is new at 5 mm on 38/6. More central 3 mm left upper lobe pulmonary nodule on 72/6, new. New left lower lobe pulmonary nodule of 8 mm on 90/6. Musculoskeletal: Osseous metastasis. Index lucent lesion within the T1 vertebral body is eccentric right and similar in size at 8 mm. CT ABDOMEN PELVIS FINDINGS Hepatobiliary: The segment 4A liver lesion measures 9 mm on 46/2 versus 13 mm on the prior. However, there has been development of innumerable small bilateral liver lesions, consistent with diffuse metastasis. Capsular based high right hepatic lobe lesion measures 3.2 x 2.0 cm on 46/2. Cholecystectomy, without biliary ductal dilatation. Pancreas: Normal, without mass or ductal dilatation. Spleen: Normal in size, without focal  abnormality. Adrenals/Urinary Tract: Normal adrenal glands. Heterogeneous renal enhancement bilaterally. This is most conspicuous on kidney delayed images. No hydronephrosis. Normal urinary bladder. Stomach/Bowel: Normal stomach, without wall thickening. Colonic stool burden suggests constipation. Normal small bowel. Vascular/Lymphatic: Aortic atherosclerosis. No abdominopelvic adenopathy. Reproductive: Normal prostate. Other: Small volume abdominopelvic fluid, minimally increased. No well-defined omental/peritoneal metastasis. Musculoskeletal: Superior endplate mild compression deformity at L 2 is likely due to an underlying pathologic fracture and is similar. Similar eccentric right L4 lytic metastasis. IMPRESSION: CT CHEST IMPRESSION 1. Development of left-sided pulmonary nodules, consistent with metastatic disease. 2. Increased right larger than left pleural effusions. 3.  Aortic Atherosclerosis (ICD10-I70.0). 4. Similar osseous metastasis. CT ABDOMEN AND PELVIS IMPRESSION 1. Progressive hepatic metastasis. 2. Similar osseous metastasis. 3.  Possible constipation. 4. Slight increase in small volume abdominopelvic fluid. Electronically Signed   By: Abigail Miyamoto M.D.   On: 03/18/2019 12:43   [place] Chest (pleural Effusion)  Result Date: 03/22/2019 CLINICAL DATA:  48 year old male  with a history of lung cancer and recurrent pleural effusion. He presents for thoracentesis. However, he is significantly hypertensive with a blood pressure in the 03U systolic. EXAM: CHEST ULTRASOUND COMPARISON:  None. FINDINGS: Ultrasound interrogation of the right chest demonstrates a moderately complex pleural effusion with extensive internal septations. IMPRESSION: 1. Loculated right pleural effusion with multiple internal septations. 2. The patient was hypertensive and therefore unable to undergo thoracentesis at this time for fear of complication such as syncope. We will reschedule. Electronically Signed   By: Jacqulynn Cadet  M.D.   On: 03/22/2019 16:16   Ct Abdomen Pelvis W Contrast  Result Date: 03/18/2019 CLINICAL DATA:  History of lung cancer diagnosed in 2019. Chemotherapy in progress. Radiation therapy complete. EXAM: CT CHEST, ABDOMEN, AND PELVIS WITH CONTRAST TECHNIQUE: Multidetector CT imaging of the chest, abdomen and pelvis was performed following the standard protocol during bolus administration of intravenous contrast. CONTRAST:  133mL OMNIPAQUE IOHEXOL 300 MG/ML  SOLN COMPARISON:  01/14/2019 FINDINGS: CT CHEST FINDINGS Cardiovascular: Aortic atherosclerosis. Tortuous thoracic aorta. Normal heart size, without pericardial effusion. Median sternotomy for CABG. No central pulmonary embolism, on this non-dedicated study. Mediastinum/Nodes: No supraclavicular adenopathy. No hilar adenopathy. Favor minimal fluid versus less likely a lymph node adjacent the esophagus on 47/2, new. Lungs/Pleura: Moderate right pleural effusion, increased. Small to moderate left pleural effusion, increased. Minimal motion degradation in the lower chest. Right lower lobe volume loss and interstitial thickening, increased. No well-defined residual right lower lobe mass. Irregular right upper lobe nodule on the order of 1.3 cm on 64/6 is felt to be similar. Pleural-based left upper lobe pulmonary nodule is new at 5 mm on 38/6. More central 3 mm left upper lobe pulmonary nodule on 72/6, new. New left lower lobe pulmonary nodule of 8 mm on 90/6. Musculoskeletal: Osseous metastasis. Index lucent lesion within the T1 vertebral body is eccentric right and similar in size at 8 mm. CT ABDOMEN PELVIS FINDINGS Hepatobiliary: The segment 4A liver lesion measures 9 mm on 46/2 versus 13 mm on the prior. However, there has been development of innumerable small bilateral liver lesions, consistent with diffuse metastasis. Capsular based high right hepatic lobe lesion measures 3.2 x 2.0 cm on 46/2. Cholecystectomy, without biliary ductal dilatation. Pancreas:  Normal, without mass or ductal dilatation. Spleen: Normal in size, without focal abnormality. Adrenals/Urinary Tract: Normal adrenal glands. Heterogeneous renal enhancement bilaterally. This is most conspicuous on kidney delayed images. No hydronephrosis. Normal urinary bladder. Stomach/Bowel: Normal stomach, without wall thickening. Colonic stool burden suggests constipation. Normal small bowel. Vascular/Lymphatic: Aortic atherosclerosis. No abdominopelvic adenopathy. Reproductive: Normal prostate. Other: Small volume abdominopelvic fluid, minimally increased. No well-defined omental/peritoneal metastasis. Musculoskeletal: Superior endplate mild compression deformity at L 2 is likely due to an underlying pathologic fracture and is similar. Similar eccentric right L4 lytic metastasis. IMPRESSION: CT CHEST IMPRESSION 1. Development of left-sided pulmonary nodules, consistent with metastatic disease. 2. Increased right larger than left pleural effusions. 3.  Aortic Atherosclerosis (ICD10-I70.0). 4. Similar osseous metastasis. CT ABDOMEN AND PELVIS IMPRESSION 1. Progressive hepatic metastasis. 2. Similar osseous metastasis. 3.  Possible constipation. 4. Slight increase in small volume abdominopelvic fluid. Electronically Signed   By: Abigail Miyamoto M.D.   On: 03/18/2019 12:43     I have independently reviewed the above radiology studies  and reviewed the findings with the patient.   Recent Lab Findings: Lab Results  Component Value Date   WBC 16.6 (H) 03/19/2019   HGB 11.8 (L) 03/19/2019   HCT 37.3 (L) 03/19/2019  PLT 263 03/19/2019   GLUCOSE 99 03/19/2019   CHOL 127 02/02/2018   TRIG 99 02/02/2018   HDL 35 (L) 02/02/2018   LDLCALC 72 02/02/2018   ALT 18 03/19/2019   AST 35 03/19/2019   NA 135 03/19/2019   K 4.3 03/19/2019   CL 99 03/19/2019   CREATININE 0.73 03/19/2019   BUN 21 (H) 03/19/2019   CO2 23 03/19/2019   TSH 2.476 09/18/2018   INR 1.02 09/06/2017   HGBA1C 5.2 06/16/2017       Assessment / Plan:   Patient with persistent right pleural effusion small left pleural effusion with known stage IV non-small cell carcinoma of the lung undergoing active treatment.  I recommend to the patient we consider placement of a right Pleurx catheter to keep the right pleural effusion drained risks and options of the procedure were discussed with the patient and he is willing to proceed, we will plan for this on Wednesday August 26.     Grace Isaac MD      [address] [place] [address] Office [phone]   Beeper [phone]  03/25/2019 3:48 PM

## 2019-03-25 NOTE — Telephone Encounter (Signed)
Faxed medical records to [phone] at [facility] (Dr. Lissa Morales) for referral. Referral ID# [id number]

## 2019-03-25 NOTE — Telephone Encounter (Signed)
[facility] referral -I faxed records.

## 2019-03-26 ENCOUNTER — Other Ambulatory Visit: Payer: Medicaid Other

## 2019-03-26 ENCOUNTER — Encounter (HOSPITAL_COMMUNITY): Payer: Self-pay | Admitting: *Deleted

## 2019-03-26 ENCOUNTER — Inpatient Hospital Stay: Payer: Medicaid Other

## 2019-03-26 ENCOUNTER — Other Ambulatory Visit: Payer: Self-pay

## 2019-03-26 VITALS — BP 96/75 | HR 76 | Temp 98.0°F | Resp 16

## 2019-03-26 DIAGNOSIS — I517 Cardiomegaly: Secondary | ICD-10-CM | POA: Diagnosis not present

## 2019-03-26 DIAGNOSIS — J90 Pleural effusion, not elsewhere classified: Secondary | ICD-10-CM | POA: Diagnosis not present

## 2019-03-26 DIAGNOSIS — I252 Old myocardial infarction: Secondary | ICD-10-CM | POA: Diagnosis not present

## 2019-03-26 DIAGNOSIS — C7951 Secondary malignant neoplasm of bone: Secondary | ICD-10-CM | POA: Diagnosis not present

## 2019-03-26 DIAGNOSIS — Z951 Presence of aortocoronary bypass graft: Secondary | ICD-10-CM | POA: Diagnosis not present

## 2019-03-26 DIAGNOSIS — C787 Secondary malignant neoplasm of liver and intrahepatic bile duct: Secondary | ICD-10-CM | POA: Diagnosis not present

## 2019-03-26 DIAGNOSIS — C3490 Malignant neoplasm of unspecified part of unspecified bronchus or lung: Secondary | ICD-10-CM | POA: Diagnosis not present

## 2019-03-26 DIAGNOSIS — Z5111 Encounter for antineoplastic chemotherapy: Secondary | ICD-10-CM | POA: Diagnosis not present

## 2019-03-26 DIAGNOSIS — C3491 Malignant neoplasm of unspecified part of right bronchus or lung: Secondary | ICD-10-CM

## 2019-03-26 DIAGNOSIS — N183 Chronic kidney disease, stage 3 (moderate): Secondary | ICD-10-CM | POA: Diagnosis not present

## 2019-03-26 DIAGNOSIS — Z87891 Personal history of nicotine dependence: Secondary | ICD-10-CM | POA: Diagnosis not present

## 2019-03-26 LAB — CBC WITH DIFFERENTIAL (CANCER CENTER ONLY)
Abs Immature Granulocytes: 0.03 10*3/uL (ref 0.00–0.07)
Basophils Absolute: 0.1 10*3/uL (ref 0.0–0.1)
Basophils Relative: 1 %
Eosinophils Absolute: 0 10*3/uL (ref 0.0–0.5)
Eosinophils Relative: 0 %
HCT: 36.8 % — ABNORMAL LOW (ref 39.0–52.0)
Hemoglobin: 11.4 g/dL — ABNORMAL LOW (ref 13.0–17.0)
Immature Granulocytes: 0 %
Lymphocytes Relative: 12 %
Lymphs Abs: 1.2 10*3/uL (ref 0.7–4.0)
MCH: 30 pg (ref 26.0–34.0)
MCHC: 31 g/dL (ref 30.0–36.0)
MCV: 96.8 fL (ref 80.0–100.0)
Monocytes Absolute: 0.9 10*3/uL (ref 0.1–1.0)
Monocytes Relative: 9 %
Neutro Abs: 7.8 10*3/uL — ABNORMAL HIGH (ref 1.7–7.7)
Neutrophils Relative %: 78 %
Platelet Count: 232 10*3/uL (ref 150–400)
RBC: 3.8 MIL/uL — ABNORMAL LOW (ref 4.22–5.81)
RDW: 19.5 % — ABNORMAL HIGH (ref 11.5–15.5)
WBC Count: 10.1 10*3/uL (ref 4.0–10.5)
nRBC: 0 % (ref 0.0–0.2)

## 2019-03-26 LAB — CMP (CANCER CENTER ONLY)
ALT: 15 U/L (ref 0–44)
AST: 26 U/L (ref 15–41)
Albumin: 2.9 g/dL — ABNORMAL LOW (ref 3.5–5.0)
Alkaline Phosphatase: 118 U/L (ref 38–126)
Anion gap: 12 (ref 5–15)
BUN: 11 mg/dL (ref 6–20)
CO2: 26 mmol/L (ref 22–32)
Calcium: 9.3 mg/dL (ref 8.9–10.3)
Chloride: 100 mmol/L (ref 98–111)
Creatinine: 0.73 mg/dL (ref 0.61–1.24)
GFR, Est AFR Am: >60 mL/min (ref >60)
GFR, Estimated: >60 mL/min (ref >60)
Glucose, Bld: 102 mg/dL — ABNORMAL HIGH (ref 70–99)
Potassium: 4.3 mmol/L (ref 3.5–5.1)
Sodium: 138 mmol/L (ref 135–145)
Total Bilirubin: 0.4 mg/dL (ref 0.3–1.2)
Total Protein: 6.1 g/dL — ABNORMAL LOW (ref 6.5–8.1)

## 2019-03-26 LAB — SARS CORONAVIRUS 2 (TAT 6-24 HRS): SARS Coronavirus 2: NEGATIVE

## 2019-03-26 MED ORDER — SODIUM CHLORIDE 0.9 % IV SOLN
Freq: Once | INTRAVENOUS | Status: AC
  Administered 2019-03-26: 09:00:00 via INTRAVENOUS
  Filled 2019-03-26: qty 250

## 2019-03-26 MED ORDER — PROCHLORPERAZINE MALEATE 10 MG PO TABS
ORAL_TABLET | ORAL | Status: AC
  Filled 2019-03-26: qty 1

## 2019-03-26 MED ORDER — PROCHLORPERAZINE MALEATE 10 MG PO TABS
10.0000 mg | ORAL_TABLET | Freq: Once | ORAL | Status: AC
  Administered 2019-03-26: 10 mg via ORAL

## 2019-03-26 MED ORDER — SODIUM CHLORIDE 0.9 % IV SOLN
1000.0000 mg/m2 | Freq: Once | INTRAVENOUS | Status: AC
  Administered 2019-03-26: 1976 mg via INTRAVENOUS
  Filled 2019-03-26: qty 51.97

## 2019-03-26 MED ORDER — SODIUM CHLORIDE 0.9 % IV SOLN
Freq: Once | INTRAVENOUS | Status: AC
  Administered 2019-03-26: 10:00:00 via INTRAVENOUS
  Filled 2019-03-26: qty 250

## 2019-03-26 NOTE — Progress Notes (Addendum)
Anthony [name] denies chest pain or shortness of breath. I asked patient if he has neuropathy and he denies.  Anthony [name] reports that he was taking gabapentin for Singles. Shingles is on his left buttocks and left back.  Patient reports that it is dried. Anthony [name] reports that it has been over 5 weeks since it started. I do not find mention of shingles in the past couple office visits with oncologist. I have not been able to get a number for Infection Control, I called Rolla Flatten, she does not have a number, but said it has been over 5 weeks and is dried, we do not have to place on Isolation

## 2019-03-26 NOTE — Patient Instructions (Signed)
[facility] Discharge Instructions for Patients Receiving Chemotherapy  Today you received the following chemotherapy agents Gemzar  To help prevent nausea and vomiting after your treatment, we encourage you to take your nausea medication as directed by MD   If you develop nausea and vomiting that is not controlled by your nausea medication, call the clinic.   BELOW ARE SYMPTOMS THAT SHOULD BE REPORTED IMMEDIATELY:  *FEVER GREATER THAN 100.5 F  *CHILLS WITH OR WITHOUT FEVER  NAUSEA AND VOMITING THAT IS NOT CONTROLLED WITH YOUR NAUSEA MEDICATION  *UNUSUAL SHORTNESS OF BREATH  *UNUSUAL BRUISING OR BLEEDING  TENDERNESS IN MOUTH AND THROAT WITH OR WITHOUT PRESENCE OF ULCERS  *URINARY PROBLEMS  *BOWEL PROBLEMS  UNUSUAL RASH Items with * indicate a potential emergency and should be followed up as soon as possible.  Feel free to call the clinic should you have any questions or concerns. The clinic phone number is [phone].  Please show the [place] at check-in to the Emergency Department and triage nurse.  Gemcitabine injection What is this medicine? GEMCITABINE (jem SYE ta been) is a chemotherapy drug. This medicine is used to treat many types of cancer like breast cancer, lung cancer, pancreatic cancer, and ovarian cancer. This medicine may be used for other purposes; ask your health care provider or pharmacist if you have questions. COMMON BRAND NAME(S): Gemzar, Infugem What should I tell my health care provider before I take this medicine? They need to know if you have any of these conditions:  blood disorders  infection  kidney disease  liver disease  lung or breathing disease, like asthma  recent or ongoing radiation therapy  an unusual or allergic reaction to gemcitabine, other chemotherapy, other medicines, foods, dyes, or preservatives  pregnant or trying to get pregnant  breast-feeding How should I use this medicine? This drug  is given as an infusion into a vein. It is administered in a hospital or clinic by a specially trained health care professional. Talk to your pediatrician regarding the use of this medicine in children. Special care may be needed. Overdosage: If you think you have taken too much of this medicine contact a poison control center or emergency room at once. NOTE: This medicine is only for you. Do not share this medicine with others. What if I miss a dose? It is important not to miss your dose. Call your doctor or health care professional if you are unable to keep an appointment. What may interact with this medicine?  medicines to increase blood counts like filgrastim, pegfilgrastim, sargramostim  some other chemotherapy drugs like cisplatin  vaccines Talk to your doctor or health care professional before taking any of these medicines:  acetaminophen  aspirin  ibuprofen  ketoprofen  naproxen This list may not describe all possible interactions. Give your health care provider a list of all the medicines, herbs, non-prescription drugs, or dietary supplements you use. Also tell them if you smoke, drink alcohol, or use illegal drugs. Some items may interact with your medicine. What should I watch for while using this medicine? Visit your doctor for checks on your progress. This drug may make you feel generally unwell. This is not uncommon, as chemotherapy can affect healthy cells as well as cancer cells. Report any side effects. Continue your course of treatment even though you feel ill unless your doctor tells you to stop. In some cases, you may be given additional medicines to help with side effects. Follow all directions for their  use. Call your doctor or health care professional for advice if you get a fever, chills or sore throat, or other symptoms of a cold or flu. Do not treat yourself. This drug decreases your body's ability to fight infections. Try to avoid being around people who are  sick. This medicine may increase your risk to bruise or bleed. Call your doctor or health care professional if you notice any unusual bleeding. Be careful brushing and flossing your teeth or using a toothpick because you may get an infection or bleed more easily. If you have any dental work done, tell your dentist you are receiving this medicine. Avoid taking products that contain aspirin, acetaminophen, ibuprofen, naproxen, or ketoprofen unless instructed by your doctor. These medicines may hide a fever. Do not become pregnant while taking this medicine or for 6 months after stopping it. Women should inform their doctor if they wish to become pregnant or think they might be pregnant. Men should not father a child while taking this medicine and for 3 months after stopping it. There is a potential for serious side effects to an unborn child. Talk to your health care professional or pharmacist for more information. Do not breast-feed an infant while taking this medicine or for at least 1 week after stopping it. Men should inform their doctors if they wish to father a child. This medicine may lower sperm counts. Talk with your doctor or health care professional if you are concerned about your fertility. What side effects may I notice from receiving this medicine? Side effects that you should report to your doctor or health care professional as soon as possible:  allergic reactions like skin rash, itching or hives, swelling of the face, lips, or tongue  breathing problems  pain, redness, or irritation at site where injected  signs and symptoms of a dangerous change in heartbeat or heart rhythm like chest pain; dizziness; fast or irregular heartbeat; palpitations; feeling faint or lightheaded, falls; breathing problems  signs of decreased platelets or bleeding - bruising, pinpoint red spots on the skin, black, tarry stools, blood in the urine  signs of decreased red blood cells - unusually weak or  tired, feeling faint or lightheaded, falls  signs of infection - fever or chills, cough, sore throat, pain or difficulty passing urine  signs and symptoms of kidney injury like trouble passing urine or change in the amount of urine  signs and symptoms of liver injury like dark yellow or brown urine; general ill feeling or flu-like symptoms; light-colored stools; loss of appetite; nausea; right upper belly pain; unusually weak or tired; yellowing of the eyes or skin  swelling of ankles, feet, hands Side effects that usually do not require medical attention (report to your doctor or health care professional if they continue or are bothersome):  constipation  diarrhea  hair loss  loss of appetite  nausea  rash  vomiting This list may not describe all possible side effects. Call your doctor for medical advice about side effects. You may report side effects to FDA at 1-800-FDA-1088. Where should I keep my medicine? This drug is given in a hospital or clinic and will not be stored at home. NOTE: This sheet is a summary. It may not cover all possible information. If you have questions about this medicine, talk to your doctor, pharmacist, or health care provider.  2020 Elsevier/Gold Standard (2017-10-11 18:06:11)

## 2019-03-27 ENCOUNTER — Other Ambulatory Visit: Payer: Self-pay

## 2019-03-27 ENCOUNTER — Ambulatory Visit (HOSPITAL_COMMUNITY): Payer: Medicaid Other

## 2019-03-27 ENCOUNTER — Encounter (HOSPITAL_COMMUNITY)
Admission: RE | Disposition: A | Discharge status: Home / Self Care | Payer: Self-pay | Source: Home / Self Care | Attending: Cardiothoracic Surgery

## 2019-03-27 ENCOUNTER — Ambulatory Visit (HOSPITAL_COMMUNITY): Payer: Medicaid Other | Admitting: Anesthesiology

## 2019-03-27 ENCOUNTER — Ambulatory Visit (HOSPITAL_COMMUNITY)
Admission: RE | Admit: 2019-03-27 | Discharge: 2019-03-27 | Disposition: A | Discharge status: Home / Self Care | Payer: Medicaid Other | Attending: Cardiothoracic Surgery | Admitting: Cardiothoracic Surgery

## 2019-03-27 ENCOUNTER — Encounter (HOSPITAL_COMMUNITY): Payer: Self-pay | Admitting: Surgery

## 2019-03-27 DIAGNOSIS — Z419 Encounter for procedure for purposes other than remedying health state, unspecified: Secondary | ICD-10-CM

## 2019-03-27 DIAGNOSIS — N183 Chronic kidney disease, stage 3 (moderate): Secondary | ICD-10-CM | POA: Insufficient documentation

## 2019-03-27 DIAGNOSIS — Z951 Presence of aortocoronary bypass graft: Secondary | ICD-10-CM | POA: Insufficient documentation

## 2019-03-27 DIAGNOSIS — C3490 Malignant neoplasm of unspecified part of unspecified bronchus or lung: Secondary | ICD-10-CM | POA: Diagnosis not present

## 2019-03-27 DIAGNOSIS — Z87891 Personal history of nicotine dependence: Secondary | ICD-10-CM | POA: Insufficient documentation

## 2019-03-27 DIAGNOSIS — J90 Pleural effusion, not elsewhere classified: Secondary | ICD-10-CM | POA: Insufficient documentation

## 2019-03-27 DIAGNOSIS — I252 Old myocardial infarction: Secondary | ICD-10-CM | POA: Insufficient documentation

## 2019-03-27 DIAGNOSIS — C7951 Secondary malignant neoplasm of bone: Secondary | ICD-10-CM | POA: Diagnosis not present

## 2019-03-27 DIAGNOSIS — C787 Secondary malignant neoplasm of liver and intrahepatic bile duct: Secondary | ICD-10-CM | POA: Insufficient documentation

## 2019-03-27 DIAGNOSIS — I517 Cardiomegaly: Secondary | ICD-10-CM | POA: Insufficient documentation

## 2019-03-27 HISTORY — DX: Personal history of other medical treatment: Z92.89

## 2019-03-27 HISTORY — DX: Chronic kidney disease, unspecified: N18.9

## 2019-03-27 HISTORY — DX: Depression, unspecified: F32.A

## 2019-03-27 HISTORY — DX: Secondary malignant neoplasm of bone: C79.51

## 2019-03-27 HISTORY — DX: Secondary malignant neoplasm of liver and intrahepatic bile duct: C78.7

## 2019-03-27 HISTORY — PX: CHEST TUBE INSERTION: SHX231

## 2019-03-27 HISTORY — DX: Pleural effusion, not elsewhere classified: J90

## 2019-03-27 LAB — SURGICAL PCR SCREEN
MRSA, PCR: NEGATIVE
Staphylococcus aureus: NEGATIVE

## 2019-03-27 LAB — APTT: aPTT: 21 seconds — ABNORMAL LOW (ref 24–36)

## 2019-03-27 LAB — PROTIME-INR
INR: 1.1 (ref 0.8–1.2)
Prothrombin Time: 13.7 seconds (ref 11.4–15.2)

## 2019-03-27 SURGERY — INSERTION, PLEURAL DRAINAGE CATHETER
Anesthesia: Monitor Anesthesia Care | Laterality: Right

## 2019-03-27 MED ORDER — MIDAZOLAM HCL 2 MG/2ML IJ SOLN
INTRAMUSCULAR | Status: AC
  Filled 2019-03-27: qty 2

## 2019-03-27 MED ORDER — LIDOCAINE 1 % OPTIME INJ - NO CHARGE
INTRAMUSCULAR | Status: DC | PRN
  Administered 2019-03-27: 15 mL

## 2019-03-27 MED ORDER — PHENYLEPHRINE 40 MCG/ML (10ML) SYRINGE FOR IV PUSH (FOR BLOOD PRESSURE SUPPORT)
PREFILLED_SYRINGE | INTRAVENOUS | Status: DC | PRN
  Administered 2019-03-27 (×5): 40 ug via INTRAVENOUS

## 2019-03-27 MED ORDER — MUPIROCIN 2 % EX OINT
1.0000 "application " | TOPICAL_OINTMENT | Freq: Once | CUTANEOUS | Status: AC
  Administered 2019-03-27: 10:00:00 1 via TOPICAL

## 2019-03-27 MED ORDER — PROPOFOL 500 MG/50ML IV EMUL
INTRAVENOUS | Status: DC | PRN
  Administered 2019-03-27: 50 ug/kg/min via INTRAVENOUS

## 2019-03-27 MED ORDER — FENTANYL CITRATE (PF) 250 MCG/5ML IJ SOLN
INTRAMUSCULAR | Status: AC
  Filled 2019-03-27: qty 5

## 2019-03-27 MED ORDER — DEXMEDETOMIDINE HCL 200 MCG/2ML IV SOLN
INTRAVENOUS | Status: DC | PRN
  Administered 2019-03-27: 8 ug via INTRAVENOUS
  Administered 2019-03-27: 4 ug via INTRAVENOUS
  Administered 2019-03-27: 8 ug via INTRAVENOUS

## 2019-03-27 MED ORDER — PROPOFOL 1000 MG/100ML IV EMUL
INTRAVENOUS | Status: AC
  Filled 2019-03-27: qty 100

## 2019-03-27 MED ORDER — LIDOCAINE HCL 1 % IJ SOLN: INTRAMUSCULAR | Status: DC | PRN

## 2019-03-27 MED ORDER — PHENYLEPHRINE 40 MCG/ML (10ML) SYRINGE FOR IV PUSH (FOR BLOOD PRESSURE SUPPORT)
PREFILLED_SYRINGE | INTRAVENOUS | Status: AC
  Filled 2019-03-27: qty 10

## 2019-03-27 MED ORDER — LIDOCAINE 2% (20 MG/ML) 5 ML SYRINGE
INTRAMUSCULAR | Status: AC
  Filled 2019-03-27: qty 5

## 2019-03-27 MED ORDER — PROPOFOL 10 MG/ML IV BOLUS
INTRAVENOUS | Status: DC | PRN
  Administered 2019-03-27 (×2): 10 mg via INTRAVENOUS

## 2019-03-27 MED ORDER — MIDAZOLAM HCL 2 MG/2ML IJ SOLN
INTRAMUSCULAR | Status: DC | PRN
  Administered 2019-03-27: 2 mg via INTRAVENOUS

## 2019-03-27 MED ORDER — LIDOCAINE HCL (CARDIAC) PF 100 MG/5ML IV SOSY
PREFILLED_SYRINGE | INTRAVENOUS | Status: DC | PRN
  Administered 2019-03-27: 50 mg via INTRAVENOUS

## 2019-03-27 MED ORDER — FENTANYL CITRATE (PF) 100 MCG/2ML IJ SOLN
INTRAMUSCULAR | Status: AC
  Filled 2019-03-27: qty 2

## 2019-03-27 MED ORDER — FENTANYL CITRATE (PF) 100 MCG/2ML IJ SOLN
25.0000 ug | INTRAMUSCULAR | Status: DC | PRN
  Administered 2019-03-27 (×2): 50 ug via INTRAVENOUS

## 2019-03-27 MED ORDER — FENTANYL CITRATE (PF) 100 MCG/2ML IJ SOLN
INTRAMUSCULAR | Status: DC | PRN
  Administered 2019-03-27 (×2): 50 ug via INTRAVENOUS

## 2019-03-27 MED ORDER — CEFAZOLIN SODIUM-DEXTROSE 2-4 GM/100ML-% IV SOLN
2.0000 g | INTRAVENOUS | Status: AC
  Administered 2019-03-27: 2 g via INTRAVENOUS
  Filled 2019-03-27: qty 100

## 2019-03-27 MED ORDER — DIPHENHYDRAMINE HCL 50 MG/ML IJ SOLN
INTRAMUSCULAR | Status: AC
  Filled 2019-03-27: qty 1

## 2019-03-27 MED ORDER — MUPIROCIN 2 % EX OINT
TOPICAL_OINTMENT | CUTANEOUS | Status: AC
  Administered 2019-03-27: 1 via TOPICAL
  Filled 2019-03-27: qty 22

## 2019-03-27 MED ORDER — LACTATED RINGERS IV SOLN
INTRAVENOUS | Status: DC
  Administered 2019-03-27: 10:00:00 via INTRAVENOUS

## 2019-03-27 MED ORDER — FENTANYL CITRATE (PF) 100 MCG/2ML IJ SOLN: 25.0000 ug | INTRAMUSCULAR | Status: DC | PRN

## 2019-03-27 MED ORDER — 0.9 % SODIUM CHLORIDE (POUR BTL) OPTIME
TOPICAL | Status: DC | PRN
  Administered 2019-03-27: 11:00:00 1000 mL

## 2019-03-27 SURGICAL SUPPLY — 28 items
BLADE CLIPPER SURG (BLADE) ×2 IMPLANT
BRUSH SCRUB EZ PLAIN DRY (MISCELLANEOUS) ×4 IMPLANT
CANISTER SUCT 3000ML PPV (MISCELLANEOUS) ×2 IMPLANT
COVER SURGICAL LIGHT HANDLE (MISCELLANEOUS) ×2 IMPLANT
COVER TRANSDUCER ULTRASND GEL (DRAPE) ×2 IMPLANT
COVER WAND RF STERILE (DRAPES) ×2 IMPLANT
DERMABOND ADVANCED (GAUZE/BANDAGES/DRESSINGS) ×1
DERMABOND ADVANCED .7 DNX12 (GAUZE/BANDAGES/DRESSINGS) ×1 IMPLANT
DRAPE C-ARM 42X72 X-RAY (DRAPES) ×2 IMPLANT
DRAPE LAPAROSCOPIC ABDOMINAL (DRAPES) ×2 IMPLANT
GLOVE BIO SURGEON STRL SZ 6.5 (GLOVE) ×4 IMPLANT
GOWN STRL REUS W/ TWL LRG LVL3 (GOWN DISPOSABLE) ×2 IMPLANT
GOWN STRL REUS W/TWL LRG LVL3 (GOWN DISPOSABLE) ×2
KIT BASIN OR (CUSTOM PROCEDURE TRAY) ×2 IMPLANT
KIT PLEURX DRAIN CATH 1000ML (MISCELLANEOUS) ×2 IMPLANT
KIT PLEURX DRAIN CATH 15.5FR (DRAIN) ×2 IMPLANT
KIT TURNOVER KIT B (KITS) ×2 IMPLANT
NS IRRIG 1000ML POUR BTL (IV SOLUTION) ×2 IMPLANT
PACK GENERAL/GYN (CUSTOM PROCEDURE TRAY) ×2 IMPLANT
PAD ARMBOARD 7.5X6 YLW CONV (MISCELLANEOUS) ×4 IMPLANT
SET DRAINAGE LINE (MISCELLANEOUS) IMPLANT
SUT ETHILON 3 0 FSL (SUTURE) ×2 IMPLANT
SUT VIC AB 3-0 X1 27 (SUTURE) ×2 IMPLANT
SYR CONTROL 10ML LL (SYRINGE) ×2 IMPLANT
TOWEL GREEN STERILE (TOWEL DISPOSABLE) ×2 IMPLANT
TOWEL GREEN STERILE FF (TOWEL DISPOSABLE) ×2 IMPLANT
VALVE REPLACEMENT CAP (MISCELLANEOUS) IMPLANT
WATER STERILE IRR 1000ML POUR (IV SOLUTION) ×2 IMPLANT

## 2019-03-27 NOTE — TOC Initial Note (Signed)
Transition of Care [facility]) - Initial/Assessment Note    Patient Details  Name: Anthony [name] MRN: [id number] Date of Birth: Mar 07, 1971  [facility] CM/SW Contact:    Fuller Mandril, RN Phone Number: 03/27/2019, 12:28 PM  Clinical Narrative:                 [facility] consulted regarding home health services.   Expected Discharge Plan: [place] Services Barriers to Discharge: Other (comment)(home health)   Patient Goals and CMS Choice Patient states their goals for this hospitalization and ongoing recovery are:: go home and get better   Choice offered to / list presented to : Patient  Expected Discharge Plan and Services Expected Discharge Plan: [place]   Discharge Planning Services: CM Consult Post Acute Care Choice: Home Health, Durable Medical Equipment Living arrangements for the past 2 months: Single Family Home Expected Discharge Date: 03/27/19               DME Arranged: Chest tube pluerex DME Agency: [facility] (now [facility]) Date DME Agency Contacted: 03/27/19 Time DME Agency Contacted: 1226 Representative spoke with at DME Agency: [place]: RN Point Agency: [facility] (now [facility]) Date [place]: 03/27/19 Time [place]: 1227 Representative spoke with at [place]: [place]  Prior Living Arrangements/Services Living arrangements for the past 2 months: [place] Lives with:: Self   Do you feel safe going back to the place where you live?: Yes               Activities of Daily Living   ADL Screening (condition at time of admission) Patient's cognitive ability adequate to safely complete daily activities?: Yes Is the patient deaf or have difficulty hearing?: No Does the patient have difficulty seeing, even when wearing glasses/contacts?: No Does the patient have difficulty concentrating, remembering, or making decisions?: No Patient able to express need  for assistance with ADLs?: Yes Does the patient have difficulty dressing or bathing?: No Independently performs ADLs?: Yes (appropriate for developmental age) Does the patient have difficulty walking or climbing stairs?: Yes Weakness of Legs: Both Weakness of Arms/Hands: Both  Permission Sought/Granted Permission sought to share information with : Chartered certified accountant granted to share information with : Yes, Verbal Permission Granted     Permission granted to share info w AGENCY: home health        Emotional Assessment   Attitude/Demeanor/Rapport: Lethargic Affect (typically observed): Appropriate Orientation: : Oriented to Self, Oriented to Place, Oriented to  Time, Oriented to Situation   Psych Involvement: No (comment)  Admission diagnosis:  right pleural effusion Patient Active Problem List   Diagnosis Date Noted  . Recurrent right pleural effusion 03/19/2019  . Drug-induced neutropenia ([place]) 09/18/2018  . Liver metastases ([place]) 03/28/2018  . Elevated LFTs 02/07/2018  . Esophageal diverticulum, acquired   . Epigastric pain 11/29/2017  . Palliative care encounter 11/15/2017  . Bone metastasis ([place]) 09/20/2017  . Encounter for antineoplastic chemotherapy 09/19/2017  . Encounter for antineoplastic immunotherapy 09/19/2017  . Stage 4 lung cancer, right ([place]) 09/07/2017  . Coronary artery disease involving coronary bypass graft of native heart with angina pectoris ([place]) 06/21/2017  . Hyperlipidemia with target LDL less than 70 06/15/2017  . Stage III chronic kidney disease ([place]) 06/15/2017  . NSTEMI (non-ST elevated myocardial infarction) ([place]) 06/14/2017   PCP:  [name], MD Pharmacy:   [id number] [place],  [place] [address] Phone: [phone] Fax: [place], [place] - [place]  [address] Phone: [phone] Fax: [phone]     Social Determinants of Health (SDOH) Interventions    Readmission Risk Interventions No flowsheet data found.

## 2019-03-27 NOTE — Anesthesia Postprocedure Evaluation (Signed)
Anesthesia Post Note  Patient: Anthony [name]  Procedure(s) Performed: INSERTION PLEURAL DRAINAGE CATHETER (Right )     Patient location during evaluation: PACU Anesthesia Type: MAC Level of consciousness: awake Pain management: pain level controlled Vital Signs Assessment: post-procedure vital signs reviewed and stable Respiratory status: spontaneous breathing Postop Assessment: no apparent nausea or vomiting Anesthetic complications: no    Last Vitals:  Vitals:   03/27/19 1240 03/27/19 1255  BP: 99/74 104/75  Pulse: 75   Resp: 13   Temp:  (!) 36.2 C  SpO2: 94%     Last Pain:  Vitals:   03/27/19 1230  PainSc: 4                  Zylie Mumaw

## 2019-03-27 NOTE — Anesthesia Procedure Notes (Signed)
Procedure Name: MAC Date/Time: 03/27/2019 10:18 AM Performed by: Mariea Clonts, CRNA Pre-anesthesia Checklist: Patient identified, Emergency Drugs available, Suction available, Patient being monitored and Timeout performed Patient Re-evaluated:Patient Re-evaluated prior to induction Oxygen Delivery Method: Nasal cannula and Simple face mask

## 2019-03-27 NOTE — Discharge Planning (Signed)
[facility] declined to start services.  EDCM contacted [place] with [facility].

## 2019-03-27 NOTE — Anesthesia Preprocedure Evaluation (Signed)
Anesthesia Evaluation  Patient identified by MRN, date of birth, ID band Patient awake    Reviewed: Allergy & Precautions, NPO status , Patient's Chart, lab work & pertinent test results  Airway Mallampati: II  TM Distance: >3 FB     Dental   Pulmonary    breath sounds clear to auscultation       Cardiovascular + angina + Past MI   Rhythm:Regular Rate:Normal     Neuro/Psych    GI/Hepatic Neg liver ROS, GERD  ,  Endo/Other    Renal/GU Renal disease     Musculoskeletal   Abdominal   Peds  Hematology   Anesthesia Other Findings   Reproductive/Obstetrics                             Anesthesia Physical Anesthesia Plan  ASA: III  Anesthesia Plan: MAC   Post-op Pain Management:    Induction: Intravenous  PONV Risk Score and Plan: 1  Airway Management Planned: Nasal Cannula and Simple Face Mask  Additional Equipment:   Intra-op Plan:   Post-operative Plan:   Informed Consent: I have reviewed the patients History and Physical, chart, labs and discussed the procedure including the risks, benefits and alternatives for the proposed anesthesia with the patient or authorized representative who has indicated his/her understanding and acceptance.     Dental advisory given  Plan Discussed with: CRNA and Anesthesiologist  Anesthesia Plan Comments:         Anesthesia Quick Evaluation

## 2019-03-27 NOTE — Brief Op Note (Signed)
      [address]       [place] [address]             [phone]    03/27/2019  11:05 AM  PATIENT:  Truddie Crumble Lietzke  48 y.o. male  PRE-OPERATIVE DIAGNOSIS:  right pleural effusion  POST-OPERATIVE DIAGNOSIS:  right pleural effusion  PROCEDURE:  Procedure(s): INSERTION PLEURAL DRAINAGE CATHETER (Right) with fluro and ultrasound   SURGEON:  Surgeon(s) and Role:    * Grace Isaac, MD - Primary   ANESTHESIA:   MAC  EBL:  2 mL   BLOOD ADMINISTERED:none  DRAINS: right pleurex   LOCAL MEDICATIONS USED:  LIDOCAINE  and Amount: 10 ml  SPECIMEN:  Right pleural fluid   DISPOSITION OF SPECIMEN:  PATHOLOGY  COUNTS:  YES  TOURNIQUET:  * No tourniquets in log *  DICTATION: .Dragon Dictation  PLAN OF CARE: Discharge to home after PACU  PATIENT DISPOSITION:  PACU - hemodynamically stable.   Delay start of Pharmacological VTE agent (>24hrs) due to surgical blood loss or risk of bleeding: yes

## 2019-03-27 NOTE — H&P (Signed)
[address]       [place] [address]             [phone]                    Burt K Mandrell [facility] Medical Record #[id number] Date of Birth: 1970-08-14  Referring: No ref. provider found Primary Care: [name], MD Primary Cardiologist: Sinclair Grooms, MD  Chief Complaint:    No chief complaint on file.   History of Present Illness:    Anthony [name] 48 y.o. male is seen in the office  Yesterday  for evaluation for placement of a right Pleurx catheter.  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  He was to have a follow-up thoracentesis several days ago but when he got to radiology he was hypotensive in the 80s and any intervention was canceled.  He was referred to the surgery office to consider placement of Pleurx catheter.  The patient has had evidence of progression of disease, notes that tomorrow he is to start new chemo but is unsure what it is.  Chemo yesterday, Patient noters last 5-6 weeks pin over left flank told by oncology  was shingles   Current Activity/ Functional Status:  Patient is independent with mobility/ambulation, transfers, ADL's, IADL's.   Zubrod Score: At the time of surgery this patients most appropriate activity status/level should be described as: []     0    Normal activity, no symptoms [x]     1    Restricted in physical strenuous activity but ambulatory, able to do out light work []     2    Ambulatory and capable of self care, unable to do work activities, up and about               >50 % of waking hours                              []     3    Only limited self care, in bed greater than 50% of waking hours []     4    Completely  disabled, no self care, confined to bed or chair []     5    Moribund   Past Medical History:  Diagnosis Date   Acid reflux    Bone metastasis (HCC)    Chronic kidney disease    Stage III   Depression    History of blood transfusion    History of radiation therapy 09/21/17-10/04/17   C4 spine 30 Gy in 10 fractions, left femur 30 Gy in 10 fractions   Liver metastasis ([place])    Non-small cell lung cancer ([place])    NSTEMI (non-ST elevated myocardial infarction) ([place]) 06/13/2017   [name] 06/14/2017   Pleural effusion    Shingles 01/2019   Tailbone injury since 1993   "cracked"    Past Surgical History:  Procedure Laterality Date   CARDIAC CATHETERIZATION  06/14/2017   CHOLECYSTECTOMY N/A 12/29/2017   Procedure: LAPAROSCOPIC CHOLECYSTECTOMY;  Surgeon: Ralene Ok, MD;  Location: [facility];  Service: General;  Laterality: N/A;   CORONARY ARTERY  BYPASS GRAFT N/A 06/19/2017   Procedure: CORONARY ARTERY BYPASS GRAFTING (CABG), OFF PUMP, times one using the left internal mammary artery to LAD;  Surgeon: Grace Isaac, MD;  Location: [facility];  Service: Open Heart Surgery;  Laterality: N/A;   ESOPHAGOGASTRODUODENOSCOPY (EGD) WITH PROPOFOL N/A 11/30/2017   Procedure: ESOPHAGOGASTRODUODENOSCOPY (EGD) WITH PROPOFOL;  Surgeon: Gatha Mayer, MD;  Location: [facility];  Service: Endoscopy;  Laterality: N/A;   LEFT HEART CATH AND CORONARY ANGIOGRAPHY N/A 06/14/2017   Procedure: LEFT HEART CATH AND CORONARY ANGIOGRAPHY;  Surgeon: Sherren Mocha, MD;  Location: [facility];  Service: Cardiovascular;  Laterality: N/A;   TEE WITHOUT CARDIOVERSION N/A 06/19/2017   Procedure: TRANSESOPHAGEAL ECHOCARDIOGRAM (TEE);  Surgeon: Grace Isaac, MD;  Location: [facility];  Service: Open Heart Surgery;  Laterality: N/A;   TONSILLECTOMY  ~ 1977    Family History  Problem Relation Age of Onset   Alcohol abuse Mother    Heart disease Father    Arrhythmia Father    Aneurysm  Maternal Grandmother      Social History   Tobacco Use  Smoking Status Never Smoker  Smokeless Tobacco Former Systems developer   Types: Snuff    Social History   Substance and Sexual Activity  Alcohol Use Not Currently   Comment: 06/16/2017 "used to drink alot; pretty much stopped~ 01/2015; will have a couple drinks/month now"     No Known Allergies  Current Facility-Administered Medications  Medication Dose Route Frequency Provider Last Rate Last Dose   ceFAZolin (ANCEF) IVPB 2g/100 mL premix  2 g Intravenous 30 min Pre-Op Grace Isaac, MD       lactated ringers infusion   Intravenous Continuous Belinda Block, MD       mupirocin ointment (BACTROBAN) 2 % 1 application  1 application Topical Once Grace Isaac, MD       mupirocin ointment (BACTROBAN) 2 %            Facility-Administered Medications Ordered in Other Encounters  Medication Dose Route Frequency Provider Last Rate Last Dose   sodium chloride flush (NS) 0.9 % injection 10 mL  10 mL Intravenous PRN [name], MD          Review of Systems:     Cardiac Review of Systems: [Y] = yes  or   [ N ] = no   Chest Pain [  y  ]  Resting SOB Blue.Reese   ] Exertional SOB  [ y ]  Orthopnea [  ]   Pedal Edema [   ]    Palpitations [  ] Syncope  [  ]   Presyncope [   ]   General Review of Systems: [Y] = yes [  ]=no Constitional: recent weight change [ y ];  Wt loss over the last 3 months [   ] anorexia [  ]; fatigue [  ]; nausea [  ]; night sweats [  ]; fever [  ]; or chills [  ];           Eye : blurred vision [  ]; diplopia [   ]; vision changes [  ];  Amaurosis fugax[  ]; Resp: cough [  ];  wheezing[  ];  hemoptysis[  ]; shortness of breath[  ]; paroxysmal nocturnal dyspnea[  ]; dyspnea on exertion[  ]; or orthopnea[  ];  GI:  gallstones[  ], vomiting[  ];  dysphagia[  ]; melena[  ];  hematochezia [  ];  heartburn[  ];   Hx of  Colonoscopy[  ]; GU: kidney stones [  ]; hematuria[  ];   dysuria [  ];  nocturia[  ];   history of     obstruction [  ]; urinary frequency [  ]             Skin: rash, swelling[  ];, hair loss[  ];  peripheral edema[  ];  or itching[  ]; Musculosketetal: myalgias[  ];  joint swelling[  ];  joint erythema[  ];  joint pain[  ];  back pain[  ];  Heme/Lymph: bruising[  ];  bleeding[  ];  anemia[  ];  Neuro: TIA[  ];  headaches[  ];  stroke[  ];  vertigo[  ];  seizures[  ];   paresthesias[  ];  difficulty walking[  ];  Psych:depression[  ]; anxiety[  ];  Endocrine: diabetes[  ];  thyroid dysfunction[  ];  Immunizations: Flu up to date [  ]; Pneumococcal up to date [  ];  Other:     PHYSICAL EXAMINATION: BP 107/88    Pulse 83    Temp 98.7 F (37.1 C)    SpO2 97%  General appearance: alert, cooperative, appears older than stated age and no distress Head: Normocephalic, without obvious abnormality, atraumatic Neck: no adenopathy, no carotid bruit, no JVD, supple, symmetrical, trachea midline and thyroid not enlarged, symmetric, no tenderness/mass/nodules Lymph nodes: Cervical, supraclavicular, and axillary nodes normal. Resp: diminished breath sounds RML and RUL Back: symmetric, no curvature. ROM normal. No CVA tenderness. Cardio: regular rate and rhythm, S1, S2 normal, no murmur, click, rub or gallop GI: soft, non-tender; bowel sounds normal; no masses,  no organomegaly Extremities: extremities normal, atraumatic, no cyanosis or edema and Homans sign is negative, no sign of DVT Neurologic: Grossly normal  Diagnostic Studies & Laboratory data:     Recent Radiology Findings:   Dg Chest 2 View  Result Date: 03/25/2019 CLINICAL DATA:  History of lung cancer. Recurrent effusions. Shortness of breath. EXAM: CHEST - 2 VIEW COMPARISON:  CT 03/18/2019.  Chest x-ray 09/14/2018. FINDINGS: Prior median sternotomy. Heart size stable. Previous identified pulmonary nodule in the left lung best identified by prior chest CT. Large right-sided pleural effusion again noted. Right pleural  effusion has increased slightly from prior chest x-ray of 09/14/2018. No pneumothorax. IMPRESSION: Large right pleural effusion again noted. Pleural effusion has increased slightly from prior study of 09/14/2018. Electronically Signed   By: Marcello Moores  Register   On: 03/25/2019 12:49   Ct Chest W Contrast  Result Date: 03/18/2019 CLINICAL DATA:  History of lung cancer diagnosed in 2019. Chemotherapy in progress. Radiation therapy complete. EXAM: CT CHEST, ABDOMEN, AND PELVIS WITH CONTRAST TECHNIQUE: Multidetector CT imaging of the chest, abdomen and pelvis was performed following the standard protocol during bolus administration of intravenous contrast. CONTRAST:  139mL OMNIPAQUE IOHEXOL 300 MG/ML  SOLN COMPARISON:  01/14/2019 FINDINGS: CT CHEST FINDINGS Cardiovascular: Aortic atherosclerosis. Tortuous thoracic aorta. Normal heart size, without pericardial effusion. Median sternotomy for CABG. No central pulmonary embolism, on this non-dedicated study. Mediastinum/Nodes: No supraclavicular adenopathy. No hilar adenopathy. Favor minimal fluid versus less likely a lymph node adjacent the esophagus on 47/2, new. Lungs/Pleura: Moderate right pleural effusion, increased. Small to moderate left pleural effusion, increased. Minimal motion degradation in the lower chest. Right lower lobe volume loss and interstitial thickening, increased. No well-defined residual right lower lobe mass. Irregular right upper lobe nodule on the order of 1.3 cm on  64/6 is felt to be similar. Pleural-based left upper lobe pulmonary nodule is new at 5 mm on 38/6. More central 3 mm left upper lobe pulmonary nodule on 72/6, new. New left lower lobe pulmonary nodule of 8 mm on 90/6. Musculoskeletal: Osseous metastasis. Index lucent lesion within the T1 vertebral body is eccentric right and similar in size at 8 mm. CT ABDOMEN PELVIS FINDINGS Hepatobiliary: The segment 4A liver lesion measures 9 mm on 46/2 versus 13 mm on the prior. However, there  has been development of innumerable small bilateral liver lesions, consistent with diffuse metastasis. Capsular based high right hepatic lobe lesion measures 3.2 x 2.0 cm on 46/2. Cholecystectomy, without biliary ductal dilatation. Pancreas: Normal, without mass or ductal dilatation. Spleen: Normal in size, without focal abnormality. Adrenals/Urinary Tract: Normal adrenal glands. Heterogeneous renal enhancement bilaterally. This is most conspicuous on kidney delayed images. No hydronephrosis. Normal urinary bladder. Stomach/Bowel: Normal stomach, without wall thickening. Colonic stool burden suggests constipation. Normal small bowel. Vascular/Lymphatic: Aortic atherosclerosis. No abdominopelvic adenopathy. Reproductive: Normal prostate. Other: Small volume abdominopelvic fluid, minimally increased. No well-defined omental/peritoneal metastasis. Musculoskeletal: Superior endplate mild compression deformity at L 2 is likely due to an underlying pathologic fracture and is similar. Similar eccentric right L4 lytic metastasis. IMPRESSION: CT CHEST IMPRESSION 1. Development of left-sided pulmonary nodules, consistent with metastatic disease. 2. Increased right larger than left pleural effusions. 3.  Aortic Atherosclerosis (ICD10-I70.0). 4. Similar osseous metastasis. CT ABDOMEN AND PELVIS IMPRESSION 1. Progressive hepatic metastasis. 2. Similar osseous metastasis. 3.  Possible constipation. 4. Slight increase in small volume abdominopelvic fluid. Electronically Signed   By: Abigail Miyamoto M.D.   On: 03/18/2019 12:43   [place] Chest (pleural Effusion)  Result Date: 03/22/2019 CLINICAL DATA:  48 year old male with a history of lung cancer and recurrent pleural effusion. He presents for thoracentesis. However, he is significantly hypertensive with a blood pressure in the 27C systolic. EXAM: CHEST ULTRASOUND COMPARISON:  None. FINDINGS: Ultrasound interrogation of the right chest demonstrates a moderately complex pleural  effusion with extensive internal septations. IMPRESSION: 1. Loculated right pleural effusion with multiple internal septations. 2. The patient was hypertensive and therefore unable to undergo thoracentesis at this time for fear of complication such as syncope. We will reschedule. Electronically Signed   By: Jacqulynn Cadet M.D.   On: 03/22/2019 16:16   Ct Abdomen Pelvis W Contrast  Result Date: 03/18/2019 CLINICAL DATA:  History of lung cancer diagnosed in 2019. Chemotherapy in progress. Radiation therapy complete. EXAM: CT CHEST, ABDOMEN, AND PELVIS WITH CONTRAST TECHNIQUE: Multidetector CT imaging of the chest, abdomen and pelvis was performed following the standard protocol during bolus administration of intravenous contrast. CONTRAST:  191mL OMNIPAQUE IOHEXOL 300 MG/ML  SOLN COMPARISON:  01/14/2019 FINDINGS: CT CHEST FINDINGS Cardiovascular: Aortic atherosclerosis. Tortuous thoracic aorta. Normal heart size, without pericardial effusion. Median sternotomy for CABG. No central pulmonary embolism, on this non-dedicated study. Mediastinum/Nodes: No supraclavicular adenopathy. No hilar adenopathy. Favor minimal fluid versus less likely a lymph node adjacent the esophagus on 47/2, new. Lungs/Pleura: Moderate right pleural effusion, increased. Small to moderate left pleural effusion, increased. Minimal motion degradation in the lower chest. Right lower lobe volume loss and interstitial thickening, increased. No well-defined residual right lower lobe mass. Irregular right upper lobe nodule on the order of 1.3 cm on 64/6 is felt to be similar. Pleural-based left upper lobe pulmonary nodule is new at 5 mm on 38/6. More central 3 mm left upper lobe pulmonary nodule on 72/6, new. New left lower  lobe pulmonary nodule of 8 mm on 90/6. Musculoskeletal: Osseous metastasis. Index lucent lesion within the T1 vertebral body is eccentric right and similar in size at 8 mm. CT ABDOMEN PELVIS FINDINGS Hepatobiliary: The segment  4A liver lesion measures 9 mm on 46/2 versus 13 mm on the prior. However, there has been development of innumerable small bilateral liver lesions, consistent with diffuse metastasis. Capsular based high right hepatic lobe lesion measures 3.2 x 2.0 cm on 46/2. Cholecystectomy, without biliary ductal dilatation. Pancreas: Normal, without mass or ductal dilatation. Spleen: Normal in size, without focal abnormality. Adrenals/Urinary Tract: Normal adrenal glands. Heterogeneous renal enhancement bilaterally. This is most conspicuous on kidney delayed images. No hydronephrosis. Normal urinary bladder. Stomach/Bowel: Normal stomach, without wall thickening. Colonic stool burden suggests constipation. Normal small bowel. Vascular/Lymphatic: Aortic atherosclerosis. No abdominopelvic adenopathy. Reproductive: Normal prostate. Other: Small volume abdominopelvic fluid, minimally increased. No well-defined omental/peritoneal metastasis. Musculoskeletal: Superior endplate mild compression deformity at L 2 is likely due to an underlying pathologic fracture and is similar. Similar eccentric right L4 lytic metastasis. IMPRESSION: CT CHEST IMPRESSION 1. Development of left-sided pulmonary nodules, consistent with metastatic disease. 2. Increased right larger than left pleural effusions. 3.  Aortic Atherosclerosis (ICD10-I70.0). 4. Similar osseous metastasis. CT ABDOMEN AND PELVIS IMPRESSION 1. Progressive hepatic metastasis. 2. Similar osseous metastasis. 3.  Possible constipation. 4. Slight increase in small volume abdominopelvic fluid. Electronically Signed   By: Abigail Miyamoto M.D.   On: 03/18/2019 12:43     I have independently reviewed the above radiology studies  and reviewed the findings with the patient.   Recent Lab Findings: Lab Results  Component Value Date   WBC 10.1 03/26/2019   HGB 11.4 (L) 03/26/2019   HCT 36.8 (L) 03/26/2019   PLT 232 03/26/2019   GLUCOSE 102 (H) 03/26/2019   CHOL 127 02/02/2018   TRIG 99  02/02/2018   HDL 35 (L) 02/02/2018   LDLCALC 72 02/02/2018   ALT 15 03/26/2019   AST 26 03/26/2019   NA 138 03/26/2019   K 4.3 03/26/2019   CL 100 03/26/2019   CREATININE 0.73 03/26/2019   BUN 11 03/26/2019   CO2 26 03/26/2019   TSH 2.476 09/18/2018   INR 1.02 09/06/2017   HGBA1C 5.2 06/16/2017      Assessment / Plan:   Patient with persistent right pleural effusion small left pleural effusion with known stage IV non-small cell carcinoma of the lung undergoing active treatment.  I recommend to the patient we consider placement of a right Pleurx catheter to keep the right pleural effusion drained risks and options of the procedure were discussed with the patient and he is willing to proceed,  The goals risks and alternatives of the planned surgical procedure Procedure(s): INSERTION PLEURAL DRAINAGE CATHETER (Right)  have been discussed with the patient in detail. The risks of the procedure including death, infection, stroke, myocardial infarction, bleeding, blood transfusion have all been discussed specifically.  I have quoted Truddie Crumble Mcclusky a 1% of perioperative mortality and a complication rate as high as 10 %. The patient's questions have been answered.Sheldon Sem Srinivasan is willing  to proceed with the planned procedure.   Grace Isaac MD      [address] [place] [address] Office [phone]   [place]eeper [phone]  03/27/2019 9:15 AM

## 2019-03-27 NOTE — TOC Progression Note (Signed)
Transition of Care [facility]) - Progression Note    Patient Details  Name: KIMM UNGARO MRN: [id number] Date of Birth: [birth year]  [facility]) CM/SW Contact  Fuller Mandril, RN Phone Number: 03/27/2019, 12:29 PM  Clinical Narrative:    [facility] met with pt at bedside (PACU) regarding home health services.  Pt states friends and kids will be able to learn and help at home.   Expected Discharge Plan: [place] Services Barriers to Discharge: Other (comment)(home health)  Expected Discharge Plan and Services Expected Discharge Plan: [place]   Discharge Planning Services: CM Consult Post Acute Care Choice: Home Health, Durable Medical Equipment Living arrangements for the past 2 months: Single Family Home Expected Discharge Date: 03/27/19               DME Arranged: Chest tube pluerex DME Agency: [facility] (now [facility]) Date DME Agency Contacted: 03/27/19 Time DME Agency Contacted: 44 Representative spoke with at DME Agency: [place]: RN [place] Agency: [facility] (now [facility]) Date [place]: 03/27/19 Time [place]: 1227 Representative spoke with at [place]: [place] ([place]) Interventions    Readmission Risk Interventions No flowsheet data found.

## 2019-03-27 NOTE — Transfer of Care (Signed)
Immediate Anesthesia Transfer of Care Note  Patient: Anthony [name]  Procedure(s) Performed: INSERTION PLEURAL DRAINAGE CATHETER (Right )  Patient Location: PACU  Anesthesia Type:MAC  Level of Consciousness: awake, alert  and oriented  Airway & Oxygen Therapy: Patient Spontanous Breathing, Patient connected to nasal cannula oxygen and Patient connected to face mask oxygen  Post-op Assessment: Report given to RN, Post -op Vital signs reviewed and stable and Patient moving all extremities X 4  Post vital signs: Reviewed and stable  Last Vitals:  Vitals Value Taken Time  BP 100/57 03/27/19 1109  Temp    Pulse 75 03/27/19 1110  Resp 11 03/27/19 1110  SpO2 100 % 03/27/19 1110  Vitals shown include unvalidated device data.  Last Pain:  Vitals:   03/27/19 0927  PainSc: 6          Complications: No apparent anesthesia complications

## 2019-03-27 NOTE — TOC Transition Note (Signed)
Transition of Care [facility]) - CM/SW Discharge Note   Patient Details  Name: Anthony [name] MRN: [id number] Date of Birth: 1970/11/27  Transition [facility] Care [facility]) CM/SW Contact:  Fuller Mandril, RN Phone Number: 03/27/2019, 12:37 PM   Clinical Narrative:    Jason Coop. Clydene Laming, RN, BSN, [employer] [phone] Spoke with pt at bedside regarding discharge planning for [facility]. Offered pt list of home health agencies to choose from.  Pt chose [facility] to render services. Joen Laura of [facility]  notified. Patient made aware that [facility]  will be in contact in 24 hours.  No DME needs identified at this time.    Final next level of care: Home w Home Health Services Barriers to Discharge: Other (comment)(home health)   Patient Goals and CMS Choice Patient states their goals for this hospitalization and ongoing recovery are:: go home and get better   Choice offered to / list presented to : Patient  Discharge Placement                       Discharge Plan and Services   Discharge Planning Services: CM Consult Post Acute Care Choice: Home Health, Durable Medical Equipment          DME Arranged: Chest tube pluerex DME Agency: [facility] (now [facility]) Date DME Agency Contacted: 03/27/19 Time DME Agency Contacted: 1226 Representative spoke with at DME Agency: [place]: RN [place] Agency: [facility] (now [facility]) Date [place]: 03/27/19 Time [place]: 1227 Representative spoke with at [place]: [place] ([place]) Interventions     Readmission Risk Interventions No flowsheet data found.

## 2019-03-28 ENCOUNTER — Other Ambulatory Visit: Payer: Self-pay | Admitting: Internal Medicine

## 2019-03-28 ENCOUNTER — Encounter (HOSPITAL_COMMUNITY): Payer: Self-pay | Admitting: Cardiothoracic Surgery

## 2019-03-28 ENCOUNTER — Telehealth: Payer: Self-pay | Admitting: *Deleted

## 2019-03-28 ENCOUNTER — Encounter: Payer: Medicaid Other | Admitting: Cardiothoracic Surgery

## 2019-03-28 ENCOUNTER — Other Ambulatory Visit: Payer: Medicaid Other | Admitting: Internal Medicine

## 2019-03-28 DIAGNOSIS — Z515 Encounter for palliative care: Secondary | ICD-10-CM

## 2019-03-28 DIAGNOSIS — G893 Neoplasm related pain (acute) (chronic): Secondary | ICD-10-CM

## 2019-03-28 MED ORDER — OXYCODONE-ACETAMINOPHEN 5-325 MG PO TABS: ORAL_TABLET | ORAL | 0 refills | Status: DC

## 2019-03-28 NOTE — Telephone Encounter (Signed)
Pt called [facility] for pain meds to to be refilled. Message forward to MD for review.

## 2019-03-28 NOTE — Progress Notes (Signed)
    [facility] Consult Note Telephone: [phone]  Fax: [phone]  PATIENT NAME: Anthony [name] DOB: Jan 19, 1971 MRN: [id number]  PRIMARY CARE PROVIDER:   [name], MD  REFERRING PROVIDER:  [name], MD [address] [place],  [place] [address]  RESPONSIBLE PARTY:   self  RECOMMENDATIONS and PLAN:  1.  Advance care planning:  Plans on continuing treatment of cancer.  Recent initiation of immunotherapy.  Full scope of treatment is goal.  2. Cancer related pain:  Partial improvement with use of Percocet 5/325mg .  Continue same.  May need to increase to 10/325mg  for improved relief  3. Weight Loss:  Current wt 176#  Continue meal supplements and monitor for stability.  I spent 30 minutes providing this consultation,  from 1000 to 1030. More than 50% of the time in this consultation was spent coordinating communication with patient.   Due to the COVID-19 crisis, this visit occurred telephonically from my office and was initiated and consented by the patient.  HISTORY OF PRESENT ILLNESS: Follow-up with Truddie Crumble Clausen.  Pt. Reports that he is continuing treatments for cancer and will continue as long as possible.  He is able to continue his ADLs and friends assist with erronds and groceries. No acute illnesses.  Palliative Care was asked to help address goals of care.   CODE STATUS: FULL CODE  PPS: 50% HOSPICE ELIGIBILITY/DIAGNOSIS: TBD  PAST MEDICAL HISTORY:  Past Medical History:  Diagnosis Date  . Acid reflux   . Bone metastasis ([place])   . Chronic kidney disease    Stage III  . Depression   . History of blood transfusion   . History of radiation therapy 09/21/17-10/04/17   C4 spine 30 Gy in 10 fractions, left femur 30 Gy in 10 fractions  . Liver metastasis ([place])   . Non-small cell lung cancer ([place])   . NSTEMI (non-ST elevated myocardial infarction) ([place]) 06/13/2017   [name] 06/14/2017  . Pleural  effusion   . Shingles 01/2019  . Tailbone injury since 1993   "cracked"      PHYSICAL EXAM:   General: NAD Pulmonary: no forced speech Neurological: alert and oriented. Reports of weakness  Gonzella Lex, NP-C

## 2019-03-29 ENCOUNTER — Telehealth: Payer: Self-pay | Admitting: *Deleted

## 2019-03-29 ENCOUNTER — Ambulatory Visit (INDEPENDENT_AMBULATORY_CARE_PROVIDER_SITE_OTHER): Payer: Medicaid Other

## 2019-03-29 ENCOUNTER — Other Ambulatory Visit: Payer: Self-pay

## 2019-03-29 DIAGNOSIS — Z719 Counseling, unspecified: Secondary | ICD-10-CM

## 2019-03-29 DIAGNOSIS — J90 Pleural effusion, not elsewhere classified: Secondary | ICD-10-CM

## 2019-03-29 NOTE — Telephone Encounter (Signed)
[place] Work  Clinical Social Work contacted patient by phone for counseling session.  Patient reported he recently received Pleurx drainage.  He received two calls during session in attempts to get Pleurex drained.  Patient shared he was currently experiencing significant pain and constipation. CSW and patient discussed relationship with son and daughter and plans for upcoming [facility] appointment.  CSW reiterated importance of communication and exploring his current hopes/values.  Patient plans to follow up with CSW at a later time.   [name], LCSW  Clinical Social Worker [facility]

## 2019-03-29 NOTE — Progress Notes (Signed)
Patient called stating that he had not heard from home health yet about draining his pleruX cath, that was placed on 03/27/2019. I called [facility] and they said that they told the SW/CM at [facility] that they would not be able to take his case due to low staffing. Mr. Graumann came to the office today for pleruX draining. He drained 250 cc's of dark red pleural fluid. He will return on Monday 04/01/19 for nurse visit to drain again.

## 2019-03-29 NOTE — Telephone Encounter (Signed)
Received message from [place], [place] @ Dr. Everrett Coombe office. They are working with pt  to get him in to their office to drain his pleuryx and working on home health services.

## 2019-03-29 NOTE — Telephone Encounter (Signed)
Received call from patient. He states he had a pleuryx catheter inserted per [name] on 03/27/19. He was told that a Home Health referral would be made to assist him with draining the pleural fluid via this catheter.  As of today, he has not heard from the [place] agency. He thinks it was [facility]. Pt states he is anxious about going into the weekend, not knowing how to drain the fluid etc.    Initially, this RN called [facility] -it is the [facility] office @ [phone]. They told me they had not received the referral.  TCT Dr. Everrett Coombe office and left his nurse, Caryl Pina, a message regarding the referral and this patient's concern.  Asked Caryl Pina to return call.

## 2019-04-01 ENCOUNTER — Ambulatory Visit (INDEPENDENT_AMBULATORY_CARE_PROVIDER_SITE_OTHER): Payer: Medicaid Other

## 2019-04-01 ENCOUNTER — Other Ambulatory Visit: Payer: Self-pay

## 2019-04-01 DIAGNOSIS — J90 Pleural effusion, not elsewhere classified: Secondary | ICD-10-CM | POA: Diagnosis not present

## 2019-04-01 DIAGNOSIS — Z719 Counseling, unspecified: Secondary | ICD-10-CM | POA: Diagnosis not present

## 2019-04-01 NOTE — Op Note (Signed)
NAME: Ridinger, LEW PROUT MEDICAL RECORD QA:[id number] ACCOUNT [id number] DATE OF BIRTH:08/14/1970 FACILITY: [facility] LOCATION: [facility] PHYSICIAN:Sydnei Ohaver BServando Snare, MD  OPERATIVE REPORT  DATE OF PROCEDURE:  03/27/2019  PREOPERATIVE DIAGNOSIS:  Recurrent right pleural effusion, stage IV lung cancer.  POSTOPERATIVE DIAGNOSIS:  Recurrent right pleural effusion, stage IV lung cancer.  SURGICAL PROCEDURE:  Placement of right PleurX catheter and drainage of pleural effusion with fluoroscopic and ultrasound guidance.  SURGEON:  Lanelle Bal, MD  BRIEF HISTORY:  The patient is a 48 year old male who had urgent coronary bypass surgery in the fall of 2018.  At that time, he was also found to have lung cancer and has been undergoing treatment.  He has developed a large right pleural effusion that  has been persistent.  A thoracentesis has been done in the past with recurrence.  Because of increasing symptoms and the size of the effusion, he was referred by Oncology to consider drainage.  Placement of a PleurX catheter was recommended to the  patient who agreed and signed informed consent.  DESCRIPTION OF PROCEDURE:  Under MAC anesthesia, the patient was lightly sedated with the right side preoperatively marked.  The right chest was elevated slightly and prepped with Betadine, draped in a sterile manner.  Appropriate timeout was performed.   We then used a SonoSite ultrasound guidance to confirm the level and suitability for introducing wire into the chest, 1% lidocaine, total of 10 mL was infiltrated over the lower right chest.  Then, under fluoroscopic guidance a 16-gauge Angiocath was  introduced into the right pleural space with easy return of pleural fluid.  Through this, a guidewire was positioned into the right pleural space with fluoroscopic guidance.  With the wire in place, we then made a small counter incision more anteriorly  and the PleurX catheter was tunneled to the incision.  The insertion  site of the wire was enlarged slightly.  A dilator was placed over the wire to enlarge the tract.  Then, a peel-away sheath was introduced into the right chest and the PleurX catheter  was introduced into the pleural space and the peel-away sheath removed.  One liter of fluid was removed.  Fluoroscopic imaging showed good position of the pleural tube.  The tube was secured in place with a nylon suture and the small insertion site  closed with a 4-0 subcuticular stitch in Dermabond.  PleurX drainage dressings were placed over the wound.  Sponge and needle count was reported as correct.  The patient tolerated the procedure without obvious complication.  There was no blood loss.  The  patient was transferred to the recovery room for postoperative observation.  TN/NUANCE  D:03/29/2019 T:03/29/2019 JOB:[id number]

## 2019-04-01 NOTE — Progress Notes (Signed)
Drained pleurX today. The amount was 400 cc's of dark brown pleural fluid. He tolerated well. Will return on Thursday 9/3rd to drain again. No home health was step up from hospital.

## 2019-04-02 ENCOUNTER — Inpatient Hospital Stay: Payer: Medicaid Other | Attending: Internal Medicine

## 2019-04-02 ENCOUNTER — Inpatient Hospital Stay (HOSPITAL_BASED_OUTPATIENT_CLINIC_OR_DEPARTMENT_OTHER): Payer: Medicaid Other | Admitting: Medical

## 2019-04-02 ENCOUNTER — Other Ambulatory Visit: Payer: Self-pay

## 2019-04-02 ENCOUNTER — Encounter: Payer: Self-pay | Admitting: *Deleted

## 2019-04-02 ENCOUNTER — Telehealth: Payer: Self-pay | Admitting: *Deleted

## 2019-04-02 ENCOUNTER — Telehealth: Payer: Self-pay | Admitting: Medical Oncology

## 2019-04-02 ENCOUNTER — Inpatient Hospital Stay: Payer: Medicaid Other

## 2019-04-02 VITALS — BP 100/69 | HR 89 | Temp 98.7°F | Resp 20 | Ht 70.0 in | Wt 170.0 lb

## 2019-04-02 DIAGNOSIS — K5900 Constipation, unspecified: Secondary | ICD-10-CM | POA: Insufficient documentation

## 2019-04-02 DIAGNOSIS — I252 Old myocardial infarction: Secondary | ICD-10-CM | POA: Insufficient documentation

## 2019-04-02 DIAGNOSIS — Z8781 Personal history of (healed) traumatic fracture: Secondary | ICD-10-CM | POA: Diagnosis not present

## 2019-04-02 DIAGNOSIS — C3411 Malignant neoplasm of upper lobe, right bronchus or lung: Secondary | ICD-10-CM | POA: Insufficient documentation

## 2019-04-02 DIAGNOSIS — C3491 Malignant neoplasm of unspecified part of right bronchus or lung: Secondary | ICD-10-CM

## 2019-04-02 DIAGNOSIS — C7951 Secondary malignant neoplasm of bone: Secondary | ICD-10-CM | POA: Insufficient documentation

## 2019-04-02 DIAGNOSIS — Z7982 Long term (current) use of aspirin: Secondary | ICD-10-CM | POA: Insufficient documentation

## 2019-04-02 DIAGNOSIS — F329 Major depressive disorder, single episode, unspecified: Secondary | ICD-10-CM | POA: Insufficient documentation

## 2019-04-02 DIAGNOSIS — G893 Neoplasm related pain (acute) (chronic): Secondary | ICD-10-CM | POA: Diagnosis not present

## 2019-04-02 DIAGNOSIS — Z5111 Encounter for antineoplastic chemotherapy: Secondary | ICD-10-CM | POA: Insufficient documentation

## 2019-04-02 DIAGNOSIS — I700 Atherosclerosis of aorta: Secondary | ICD-10-CM | POA: Insufficient documentation

## 2019-04-02 DIAGNOSIS — R112 Nausea with vomiting, unspecified: Secondary | ICD-10-CM | POA: Diagnosis not present

## 2019-04-02 DIAGNOSIS — N183 Chronic kidney disease, stage 3 (moderate): Secondary | ICD-10-CM | POA: Insufficient documentation

## 2019-04-02 DIAGNOSIS — K219 Gastro-esophageal reflux disease without esophagitis: Secondary | ICD-10-CM | POA: Insufficient documentation

## 2019-04-02 DIAGNOSIS — J90 Pleural effusion, not elsewhere classified: Secondary | ICD-10-CM

## 2019-04-02 DIAGNOSIS — C787 Secondary malignant neoplasm of liver and intrahepatic bile duct: Secondary | ICD-10-CM | POA: Diagnosis not present

## 2019-04-02 DIAGNOSIS — Z79899 Other long term (current) drug therapy: Secondary | ICD-10-CM | POA: Insufficient documentation

## 2019-04-02 DIAGNOSIS — R634 Abnormal weight loss: Secondary | ICD-10-CM | POA: Diagnosis not present

## 2019-04-02 LAB — CMP (CANCER CENTER ONLY)
ALT: 27 U/L (ref 0–44)
AST: 40 U/L (ref 15–41)
Albumin: 2.4 g/dL — ABNORMAL LOW (ref 3.5–5.0)
Alkaline Phosphatase: 93 U/L (ref 38–126)
Anion gap: 12 (ref 5–15)
BUN: 32 mg/dL — ABNORMAL HIGH (ref 6–20)
CO2: 25 mmol/L (ref 22–32)
Calcium: 9.8 mg/dL (ref 8.9–10.3)
Chloride: 98 mmol/L (ref 98–111)
Creatinine: 0.71 mg/dL (ref 0.61–1.24)
GFR, Est AFR Am: >60 mL/min (ref >60)
GFR, Estimated: >60 mL/min (ref >60)
Glucose, Bld: 89 mg/dL (ref 70–99)
Potassium: 4.1 mmol/L (ref 3.5–5.1)
Sodium: 135 mmol/L (ref 135–145)
Total Bilirubin: 0.4 mg/dL (ref 0.3–1.2)
Total Protein: 6.2 g/dL — ABNORMAL LOW (ref 6.5–8.1)

## 2019-04-02 LAB — CBC WITH DIFFERENTIAL (CANCER CENTER ONLY)
Abs Immature Granulocytes: 0.02 10*3/uL (ref 0.00–0.07)
Basophils Absolute: 0 10*3/uL (ref 0.0–0.1)
Basophils Relative: 0 %
Eosinophils Absolute: 0.1 10*3/uL (ref 0.0–0.5)
Eosinophils Relative: 1 %
HCT: 31.9 % — ABNORMAL LOW (ref 39.0–52.0)
Hemoglobin: 10 g/dL — ABNORMAL LOW (ref 13.0–17.0)
Immature Granulocytes: 0 %
Lymphocytes Relative: 11 %
Lymphs Abs: 0.9 10*3/uL (ref 0.7–4.0)
MCH: 29.9 pg (ref 26.0–34.0)
MCHC: 31.3 g/dL (ref 30.0–36.0)
MCV: 95.2 fL (ref 80.0–100.0)
Monocytes Absolute: 0.4 10*3/uL (ref 0.1–1.0)
Monocytes Relative: 5 %
Neutro Abs: 6.9 10*3/uL (ref 1.7–7.7)
Neutrophils Relative %: 83 %
Platelet Count: 158 10*3/uL (ref 150–400)
RBC: 3.35 MIL/uL — ABNORMAL LOW (ref 4.22–5.81)
RDW: 18.2 % — ABNORMAL HIGH (ref 11.5–15.5)
WBC Count: 8.4 10*3/uL (ref 4.0–10.5)
nRBC: 0 % (ref 0.0–0.2)

## 2019-04-02 MED ORDER — FENTANYL 25 MCG/HR TD PT72: 1.0000 | MEDICATED_PATCH | TRANSDERMAL | 0 refills | Status: AC

## 2019-04-02 MED ORDER — PROCHLORPERAZINE MALEATE 10 MG PO TABS
10.0000 mg | ORAL_TABLET | Freq: Once | ORAL | Status: AC
  Administered 2019-04-02: 15:00:00 10 mg via ORAL

## 2019-04-02 MED ORDER — SODIUM CHLORIDE 0.9 % IV SOLN
Freq: Once | INTRAVENOUS | Status: AC
  Administered 2019-04-02: 15:00:00 via INTRAVENOUS
  Filled 2019-04-02: qty 250

## 2019-04-02 MED ORDER — SODIUM CHLORIDE 0.9 % IV SOLN
1000.0000 mg/m2 | Freq: Once | INTRAVENOUS | Status: AC
  Administered 2019-04-02: 1976 mg via INTRAVENOUS
  Filled 2019-04-02: qty 51.97

## 2019-04-02 MED ORDER — PROCHLORPERAZINE MALEATE 10 MG PO TABS
ORAL_TABLET | ORAL | Status: AC
  Filled 2019-04-02: qty 1

## 2019-04-02 MED ORDER — OXYCODONE-ACETAMINOPHEN 5-325 MG PO TABS
ORAL_TABLET | ORAL | Status: AC
  Filled 2019-04-02: qty 2

## 2019-04-02 MED ORDER — OXYCODONE-ACETAMINOPHEN 5-325 MG PO TABS
2.0000 | ORAL_TABLET | Freq: Once | ORAL | Status: AC
  Administered 2019-04-02: 2 via ORAL

## 2019-04-02 NOTE — Telephone Encounter (Signed)
EDCM attempting to set pt up with home health services after learning [facility] was unable to accept due to pt location.  Will continue to search for home health agencies.

## 2019-04-02 NOTE — Patient Instructions (Signed)
COVID-19: How to Protect Yourself and Others Know how it spreads  There is currently no vaccine to prevent coronavirus disease 2019 (COVID-19).  The best way to prevent illness is to avoid being exposed to this virus.  The virus is thought to spread mainly from person-to-person. ? Between people who are in close contact with one another (within about 6 feet). ? Through respiratory droplets produced when an infected person coughs, sneezes or talks. ? These droplets can land in the mouths or noses of people who are nearby or possibly be inhaled into the lungs. ? Some recent studies have suggested that COVID-19 may be spread by people who are not showing symptoms. Everyone should Clean your hands often  Wash your hands often with soap and water for at least 20 seconds especially after you have been in a public place, or after blowing your nose, coughing, or sneezing.  If soap and water are not readily available, use a hand sanitizer that contains at least 60% alcohol. Cover all surfaces of your hands and rub them together until they feel dry.  Avoid touching your eyes, nose, and mouth with unwashed hands. Avoid close contact  Stay home if you are sick.  Avoid close contact with people who are sick.  Put distance between yourself and other people. ? Remember that some people without symptoms may be able to spread virus. ? This is especially important for people who are at higher risk of getting very sick.www.cdc.gov/coronavirus/2019-ncov/need-extra-precautions/people-at-higher-risk.html Cover your mouth and nose with a cloth face cover when around others  You could spread COVID-19 to others even if you do not feel sick.  Everyone should wear a cloth face cover when they have to go out in public, for example to the grocery store or to pick up other necessities. ? Cloth face coverings should not be placed on young children under age 2, anyone who has trouble breathing, or is unconscious,  incapacitated or otherwise unable to remove the mask without assistance.  The cloth face cover is meant to protect other people in case you are infected.  Do NOT use a facemask meant for a healthcare worker.  Continue to keep about 6 feet between yourself and others. The cloth face cover is not a substitute for social distancing. Cover coughs and sneezes  If you are in a private setting and do not have on your cloth face covering, remember to always cover your mouth and nose with a tissue when you cough or sneeze or use the inside of your elbow.  Throw used tissues in the trash.  Immediately wash your hands with soap and water for at least 20 seconds. If soap and water are not readily available, clean your hands with a hand sanitizer that contains at least 60% alcohol. Clean and disinfect  Clean AND disinfect frequently touched surfaces daily. This includes tables, doorknobs, light switches, countertops, handles, desks, phones, keyboards, toilets, faucets, and sinks. www.cdc.gov/coronavirus/2019-ncov/prevent-getting-sick/disinfecting-your-home.html  If surfaces are dirty, clean them: Use detergent or soap and water prior to disinfection.  Then, use a household disinfectant. You can see a list of EPA-registered household disinfectants here. cdc.gov/coronavirus 12/04/2018 This information is not intended to replace advice given to you by your health care provider. Make sure you discuss any questions you have with your health care provider. Document Released: 11/13/2018 Document Revised: 12/12/2018 Document Reviewed: 11/13/2018 Elsevier Patient Education  2020 Elsevier Inc.  

## 2019-04-02 NOTE — Patient Instructions (Signed)
[facility] Discharge Instructions for Patients Receiving Chemotherapy  Today you received the following chemotherapy agents Gemzar  To help prevent nausea and vomiting after your treatment, we encourage you to take your nausea medication as directed by MD   If you develop nausea and vomiting that is not controlled by your nausea medication, call the clinic.   BELOW ARE SYMPTOMS THAT SHOULD BE REPORTED IMMEDIATELY:  *FEVER GREATER THAN 100.5 F  *CHILLS WITH OR WITHOUT FEVER  NAUSEA AND VOMITING THAT IS NOT CONTROLLED WITH YOUR NAUSEA MEDICATION  *UNUSUAL SHORTNESS OF BREATH  *UNUSUAL BRUISING OR BLEEDING  TENDERNESS IN MOUTH AND THROAT WITH OR WITHOUT PRESENCE OF ULCERS  *URINARY PROBLEMS  *BOWEL PROBLEMS  UNUSUAL RASH Items with * indicate a potential emergency and should be followed up as soon as possible.  Feel free to call the clinic should you have any questions or concerns. The clinic phone number is [phone].  Please show the [facility] at check-in to the Emergency Department and triage nurse.  Gemcitabine injection What is this medicine? GEMCITABINE (jem SYE ta been) is a chemotherapy drug. This medicine is used to treat many types of cancer like breast cancer, lung cancer, pancreatic cancer, and ovarian cancer. This medicine may be used for other purposes; ask your health care provider or pharmacist if you have questions. COMMON BRAND NAME(S): Gemzar, Infugem What should I tell my health care provider before I take this medicine? They need to know if you have any of these conditions:  blood disorders  infection  kidney disease  liver disease  lung or breathing disease, like asthma  recent or ongoing radiation therapy  an unusual or allergic reaction to gemcitabine, other chemotherapy, other medicines, foods, dyes, or preservatives  pregnant or trying to get pregnant  breast-feeding How should I use this medicine? This drug  is given as an infusion into a vein. It is administered in a hospital or clinic by a specially trained health care professional. Talk to your pediatrician regarding the use of this medicine in children. Special care may be needed. Overdosage: If you think you have taken too much of this medicine contact a poison control center or emergency room at once. NOTE: This medicine is only for you. Do not share this medicine with others. What if I miss a dose? It is important not to miss your dose. Call your doctor or health care professional if you are unable to keep an appointment. What may interact with this medicine?  medicines to increase blood counts like filgrastim, pegfilgrastim, sargramostim  some other chemotherapy drugs like cisplatin  vaccines Talk to your doctor or health care professional before taking any of these medicines:  acetaminophen  aspirin  ibuprofen  ketoprofen  naproxen This list may not describe all possible interactions. Give your health care provider a list of all the medicines, herbs, non-prescription drugs, or dietary supplements you use. Also tell them if you smoke, drink alcohol, or use illegal drugs. Some items may interact with your medicine. What should I watch for while using this medicine? Visit your doctor for checks on your progress. This drug may make you feel generally unwell. This is not uncommon, as chemotherapy can affect healthy cells as well as cancer cells. Report any side effects. Continue your course of treatment even though you feel ill unless your doctor tells you to stop. In some cases, you may be given additional medicines to help with side effects. Follow all directions for their  use. Call your doctor or health care professional for advice if you get a fever, chills or sore throat, or other symptoms of a cold or flu. Do not treat yourself. This drug decreases your body's ability to fight infections. Try to avoid being around people who are  sick. This medicine may increase your risk to bruise or bleed. Call your doctor or health care professional if you notice any unusual bleeding. Be careful brushing and flossing your teeth or using a toothpick because you may get an infection or bleed more easily. If you have any dental work done, tell your dentist you are receiving this medicine. Avoid taking products that contain aspirin, acetaminophen, ibuprofen, naproxen, or ketoprofen unless instructed by your doctor. These medicines may hide a fever. Do not become pregnant while taking this medicine or for 6 months after stopping it. Women should inform their doctor if they wish to become pregnant or think they might be pregnant. Men should not father a child while taking this medicine and for 3 months after stopping it. There is a potential for serious side effects to an unborn child. Talk to your health care professional or pharmacist for more information. Do not breast-feed an infant while taking this medicine or for at least 1 week after stopping it. Men should inform their doctors if they wish to father a child. This medicine may lower sperm counts. Talk with your doctor or health care professional if you are concerned about your fertility. What side effects may I notice from receiving this medicine? Side effects that you should report to your doctor or health care professional as soon as possible:  allergic reactions like skin rash, itching or hives, swelling of the face, lips, or tongue  breathing problems  pain, redness, or irritation at site where injected  signs and symptoms of a dangerous change in heartbeat or heart rhythm like chest pain; dizziness; fast or irregular heartbeat; palpitations; feeling faint or lightheaded, falls; breathing problems  signs of decreased platelets or bleeding - bruising, pinpoint red spots on the skin, black, tarry stools, blood in the urine  signs of decreased red blood cells - unusually weak or  tired, feeling faint or lightheaded, falls  signs of infection - fever or chills, cough, sore throat, pain or difficulty passing urine  signs and symptoms of kidney injury like trouble passing urine or change in the amount of urine  signs and symptoms of liver injury like dark yellow or brown urine; general ill feeling or flu-like symptoms; light-colored stools; loss of appetite; nausea; right upper belly pain; unusually weak or tired; yellowing of the eyes or skin  swelling of ankles, feet, hands Side effects that usually do not require medical attention (report to your doctor or health care professional if they continue or are bothersome):  constipation  diarrhea  hair loss  loss of appetite  nausea  rash  vomiting This list may not describe all possible side effects. Call your doctor for medical advice about side effects. You may report side effects to FDA at 1-800-FDA-1088. Where should I keep my medicine? This drug is given in a hospital or clinic and will not be stored at home. NOTE: This sheet is a summary. It may not cover all possible information. If you have questions about this medicine, talk to your doctor, pharmacist, or health care provider.  2020 Elsevier/Gold Standard (2017-10-11 18:06:11)

## 2019-04-02 NOTE — Progress Notes (Signed)
Pt presents to [facility] with right sided rib pain around pleurex drain that started yesterday after it was drained.  Reports N/V at that time, none currently.  Afebrile.  Reports sharp stabbing pain "worse than anything else I've had before, and my medications aren't taking it all away, just some of it".  Pt tearful during conversation, denies depression or anxiety, states his pain at this time is 6/10.  Denies changes in breathing from baseline.

## 2019-04-02 NOTE — Progress Notes (Signed)
[place] Work  Clinical Social Work received phone call from patient's friend, Anthony [name].  Anthony [name] shared her concerns and discussed strategies for supporting Anthony [name] as his health declines.  CSW and patient's friend also discussed the necessity for Anthony [name] to consider "getting affairs in order".  CSW provided active listening and explored small steps to help patient achieve goals.  CSW met with patient in symptom management exam room.  Patient reported severe pain and had difficulty articulating his thoughts during visit.  Anthony [name] agreed to completing healthcare power of attorney/living will and wanted to work on accomplishing practical tasks, but shared he is having trouble concentrating/focusing.  Patient will follow up with patient and patient's friend.   Anthony Maine, LCSW  Clinical Social Worker [facility]

## 2019-04-02 NOTE — Telephone Encounter (Signed)
Pain management - Friend concerned about pt For the first time he cried in front of her twice regarding his pain . He was  "choking back tears he was in so much pain".  She is concerned about his fall risk /safety around a puppy and his driving and taking pain meds. She is bringing him to his appt today. Per [facility] today .

## 2019-04-03 ENCOUNTER — Telehealth: Payer: Self-pay | Admitting: *Deleted

## 2019-04-03 ENCOUNTER — Telehealth: Payer: Self-pay | Admitting: Medical Oncology

## 2019-04-03 NOTE — Telephone Encounter (Signed)
Spoke to Odetta Pink and told her that Dr [name] said that  [facility] cannot drive . She will reinforce that with [facility] and take his keys if needed. She will also contact ex-wife and let her know that [place]  cannot drive and therefore cannot take his  daughter to horseback riding lessons.

## 2019-04-03 NOTE — Telephone Encounter (Signed)
04/02/2019: [place] contacted Marlana Latus regarding [place] services for Plure-X catheter management. EDCM explained that [facility] agencies are no accepting new referrals at this time in his area and suggested to educate pt in office as back up.  EDCM will continue to reach out to [facility] agencies.

## 2019-04-03 NOTE — Progress Notes (Signed)
04/02/2019 1530-spoke to pt in the infusion room . I reviewed his new pain management orders for Fentanyl and instructed him not to drive per Dr [name] . He voiced understanding.

## 2019-04-04 ENCOUNTER — Other Ambulatory Visit: Payer: Self-pay

## 2019-04-04 ENCOUNTER — Ambulatory Visit (INDEPENDENT_AMBULATORY_CARE_PROVIDER_SITE_OTHER): Payer: Medicaid Other

## 2019-04-04 DIAGNOSIS — Z719 Counseling, unspecified: Secondary | ICD-10-CM

## 2019-04-04 DIAGNOSIS — J90 Pleural effusion, not elsewhere classified: Secondary | ICD-10-CM | POA: Diagnosis not present

## 2019-04-04 NOTE — Progress Notes (Signed)
[facility] Progress Note   Anthony [name] [id number] 12-26-1970 48 y.o.  Anthony [name] is managed by Dr. Fanny Bien. Anthony [name]  Actively treated with chemotherapy/immunotherapy/hormonal therapy: yes  Current therapy: Gemcitabine  Last treated: 04/02/2019 (cycle 1, day 8)  Next scheduled appointment with provider: 04/16/2019  Assessment: Plan:    Neoplasm related pain - Plan: fentaNYL ([place]) 25 MCG/HR  Stage 4 lung cancer, right ([place])  Liver metastases ([place])  Bone metastasis ([place])  Recurrent right pleural effusion   Neoplasm related pain: The patient was given a prescription for fentanyl 25 mcg patches with instructions to use 1 every 3 days.  He will continue using Percocet for breakthrough pain.  Metastatic lung cancer with liver metastasis, bone metastasis, and a recurrent right pleural effusion: Anthony [name] is status post cycle 1, day 8 of gemcitabine which was dosed on 04/02/2019.  He will return for his next appointment on 04/16/2019.  Please see After Visit Summary for patient specific instructions.  Future Appointments  Date Time Provider [place]  04/04/2019 11:00 AM [facility] [place] NURSE [facility]  04/16/2019  9:00 AM [facility] LAB 2 [facility] None  04/16/2019  9:30 AM [name], MD [facility] None  04/16/2019 10:00 AM [facility] INFUSION [facility] None  04/18/2019 10:45 AM Grace Isaac, MD [facility]  04/23/2019  9:15 AM [facility] LAB 2 [facility] None  04/23/2019 10:15 AM [facility] INFUSION [facility] None  05/07/2019  9:00 AM [facility] LAB 2 [facility] None  05/07/2019  9:45 AM [name], MD [facility] None  05/07/2019 10:15 AM [facility] INFUSION [facility] None  05/14/2019  9:00 AM [facility] LAB 2 [facility] None  05/14/2019 10:00 AM [facility] INFUSION [facility] None    No orders of the defined types were placed in this encounter.      Subjective:   Patient ID:  Anthony [name] is  a 48 y.o. (DOB 1971/04/29) male.  Chief Complaint:  Chief Complaint  Patient presents with   Chest Pain    R side    HPI Anthony [name] is a 48 year old male with a diagnosis of a metastatic lung cancer with liver metastasis, bone metastasis, and a recurrent right pleural effusion.  He is managed by Dr. [name] and is status post cycle 1, day 8 of gemcitabine which was dosed on 04/02/2019.  He presents to the clinic today with significant pain in his right side despite his use of Percocet for breakthrough pain.  He rates his pain as 5-6/10 now.  His pain is worse at times.  It is described as a stabbing sharp pain which is exhausting to him.  He continues to have fluid drained from his right lung via a Pleurx catheter.  He had 300 mL drained on Monday and 250 mL's drained last Friday.  He has nausea and vomiting at times while he is being drained.  He has shortness of breath and dyspnea on exertion which is no greater than his baseline.  He also reports having some constipation.  Medications: I have reviewed the patient's current medications.  Allergies: No Known Allergies  Past Medical History:  Diagnosis Date   Acid reflux    Bone metastasis (HCC)    Chronic kidney disease    Stage III   Depression    History of blood transfusion    History of radiation therapy 09/21/17-10/04/17   C4 spine 30 Gy in 10 fractions, left femur 30 Gy in 10 fractions   Liver metastasis (HCC)    Non-small  cell lung cancer ([place])    NSTEMI (non-ST elevated myocardial infarction) ([place]) 06/13/2017   [name] 06/14/2017   Pleural effusion    Shingles 01/2019   Tailbone injury since 1993   "cracked"    Past Surgical History:  Procedure Laterality Date   CARDIAC CATHETERIZATION  06/14/2017   CHEST TUBE INSERTION Right 03/27/2019   Procedure: INSERTION PLEURAL DRAINAGE CATHETER;  Surgeon: Grace Isaac, MD;  Location[facility] OR;  Service: Thoracic;  Laterality: Right;   CHOLECYSTECTOMY N/A  12/29/2017   Procedure: LAPAROSCOPIC CHOLECYSTECTOMY;  Surgeon: Ralene Ok, MD;  Location: [facility] ORS;  Service: General;  Laterality: N/A;   CORONARY ARTERY BYPASS GRAFT N/A 06/19/2017   Procedure: CORONARY ARTERY BYPASS GRAFTING (CABG), OFF PUMP, times one using the left internal mammary artery to LAD;  Surgeon: Grace Isaac, MD;  Location: [place];  Service: Open Heart Surgery;  Laterality: N/A;   ESOPHAGOGASTRODUODENOSCOPY (EGD) WITH PROPOFOL N/A 11/30/2017   Procedure: ESOPHAGOGASTRODUODENOSCOPY (EGD) WITH PROPOFOL;  Surgeon: Gatha Mayer, MD;  Location: [facility];  Service: Endoscopy;  Laterality: N/A;   LEFT HEART CATH AND CORONARY ANGIOGRAPHY N/A 06/14/2017   Procedure: LEFT HEART CATH AND CORONARY ANGIOGRAPHY;  Surgeon: Sherren Mocha, MD;  Location: [place];  Service: Cardiovascular;  Laterality: N/A;   TEE WITHOUT CARDIOVERSION N/A 06/19/2017   Procedure: TRANSESOPHAGEAL ECHOCARDIOGRAM (TEE);  Surgeon: Grace Isaac, MD;  Location: [facility];  Service: Open Heart Surgery;  Laterality: N/A;   TONSILLECTOMY  ~ 1977    Family History  Problem Relation Age of Onset   Alcohol abuse Mother    Heart disease Father    Arrhythmia Father    Aneurysm Maternal Grandmother     Social History   Socioeconomic History   Marital status: Divorced    Spouse name: Not on file   Number of children: Not on file   Years of education: Not on file   Highest education level: Not on file  Occupational History   Occupation: Scientist, 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 strain: Not on file   Food insecurity    Worry: Not on file    Inability: Not on file   Transportation needs    Medical: Not on file    Non-medical: Not on file  Tobacco Use   Smoking status: Never Smoker   Smokeless tobacco: Former Systems developer    Types: Snuff  Substance and Sexual Activity   Alcohol use: Not Currently    Comment: 06/16/2017 "used to drink alot; pretty  much stopped~ 01/2015; will have a couple drinks/month now"   Drug use: Yes    Types: Other-see comments, Marijuana    Comment: 03/26/2019- marijuana in custard, prn appetite mouth sores   Sexual activity: Yes  Lifestyle   Physical activity    Days per week: Not on file    Minutes per session: Not on file   Stress: Not on file  Relationships   Social connections    Talks on phone: Not on file    Gets together: Not on file    Attends religious service: Not on file    Active member of club or organization: Not on file    Attends meetings of clubs or organizations: Not on file    Relationship status: Not on file   Intimate partner violence    Fear of current or ex partner: Not on file    Emotionally abused: Not on file  Physically abused: Not on file    Forced sexual activity: Not on file  Other Topics Concern   Not on file  Social History Narrative   Divorced   Tool Salesman   + EtOH   Never smoker    Past Medical History, Surgical history, Social history, and Family history were reviewed and updated as appropriate.   Please see review of systems for further details on the patient's review from today.   Review of Systems:  Review of Systems  Constitutional: Positive for appetite change and fatigue. Negative for chills, diaphoresis and fever.  HENT: Negative for trouble swallowing.   Respiratory: Negative for cough, choking, shortness of breath and wheezing.   Cardiovascular: Positive for chest pain. Negative for palpitations.  Gastrointestinal: Positive for nausea and vomiting. Negative for constipation and diarrhea.  Genitourinary: Negative for decreased urine volume.  Neurological: Negative for headaches.    Objective:   Physical Exam:  BP 100/69 (BP Location: Left Arm, Patient Position: Sitting)    Pulse 89    Temp 98.7 F (37.1 C)    Resp 20    Ht 5\' 10"  (1.778 m)    Wt 170 lb (77.1 kg)    SpO2 95%    BMI 24.39 kg/m  ECOG: 1  Physical  Exam Constitutional:      General: He is not in acute distress.    Appearance: He is not ill-appearing, toxic-appearing or diaphoretic.     Comments: The patient is a chronically ill-appearing adult male who appears to be in no acute distress.  HENT:     Head: Normocephalic and atraumatic.  Cardiovascular:     Rate and Rhythm: Normal rate and regular rhythm.     Heart sounds: Heart sounds not distant. No murmur. No systolic murmur.  Pulmonary:     Effort: Pulmonary effort is normal.     Breath sounds: Examination of the right-lower field reveals decreased breath sounds. Decreased breath sounds present. No wheezing, rhonchi or rales.     Comments: There is a Pleurx catheter as well light lateral chest wall. Musculoskeletal:     Right lower leg: Edema present.     Left lower leg: Edema present.     Comments: 1-2+ bilateral lower extremity edema to the knees.  Skin:    General: Skin is warm and dry.  Psychiatric:        Mood and Affect: Mood normal.        Behavior: Behavior normal.     Lab Review:     Component Value Date/Time   NA 135 04/02/2019 1331   NA 141 07/17/2017 1006   K 4.1 04/02/2019 1331   CL 98 04/02/2019 1331   CO2 25 04/02/2019 1331   GLUCOSE 89 04/02/2019 1331   BUN 32 (H) 04/02/2019 1331   BUN 12 07/17/2017 1006   CREATININE 0.71 04/02/2019 1331   CALCIUM 9.8 04/02/2019 1331   PROT 6.2 (L) 04/02/2019 1331   PROT 6.2 02/02/2018 0825   ALBUMIN 2.4 (L) 04/02/2019 1331   ALBUMIN 4.1 02/02/2018 0825   AST 40 04/02/2019 1331   ALT 27 04/02/2019 1331   ALKPHOS 93 04/02/2019 1331   BILITOT 0.4 04/02/2019 1331   GFRNONAA >60 04/02/2019 1331   GFRAA >60 04/02/2019 1331       Component Value Date/Time   WBC 8.4 04/02/2019 1331   WBC 4.1 12/01/2017 0937   RBC 3.35 (L) 04/02/2019 1331   HGB 10.0 (L) 04/02/2019 1331   HGB 14.7 07/17/2017  1006   HCT 31.9 (L) 04/02/2019 1331   HCT 42.6 07/17/2017 1006   PLT 158 04/02/2019 1331   PLT 334 07/17/2017 1006    MCV 95.2 04/02/2019 1331   MCV 89 07/17/2017 1006   MCH 29.9 04/02/2019 1331   MCHC 31.3 04/02/2019 1331   RDW 18.2 (H) 04/02/2019 1331   RDW 13.6 07/17/2017 1006   LYMPHSABS 0.9 04/02/2019 1331   LYMPHSABS 2.1 07/17/2017 1006   MONOABS 0.4 04/02/2019 1331   EOSABS 0.1 04/02/2019 1331   EOSABS 0.2 07/17/2017 1006   BASOSABS 0.0 04/02/2019 1331   BASOSABS 0.1 07/17/2017 1006   -------------------------------  Imaging from last 24 hours (if applicable):  Radiology interpretation: Dg Chest 2 View  Result Date: 03/25/2019 CLINICAL DATA:  History of lung cancer. Recurrent effusions. Shortness of breath. EXAM: CHEST - 2 VIEW COMPARISON:  CT 03/18/2019.  Chest x-ray 09/14/2018. FINDINGS: Prior median sternotomy. Heart size stable. Previous identified pulmonary nodule in the left lung best identified by prior chest CT. Large right-sided pleural effusion again noted. Right pleural effusion has increased slightly from prior chest x-ray of 09/14/2018. No pneumothorax. IMPRESSION: Large right pleural effusion again noted. Pleural effusion has increased slightly from prior study of 09/14/2018. Electronically Signed   By: Marcello Moores  Register   On: 03/25/2019 12:49   Ct Chest W Contrast  Result Date: 03/18/2019 CLINICAL DATA:  History of lung cancer diagnosed in 2019. Chemotherapy in progress. Radiation therapy complete. EXAM: CT CHEST, ABDOMEN, AND PELVIS WITH CONTRAST TECHNIQUE: Multidetector CT imaging of the chest, abdomen and pelvis was performed following the standard protocol during bolus administration of intravenous contrast. CONTRAST:  193mL OMNIPAQUE IOHEXOL 300 MG/ML  SOLN COMPARISON:  01/14/2019 FINDINGS: CT CHEST FINDINGS Cardiovascular: Aortic atherosclerosis. Tortuous thoracic aorta. Normal heart size, without pericardial effusion. Median sternotomy for CABG. No central pulmonary embolism, on this non-dedicated study. Mediastinum/Nodes: No supraclavicular adenopathy. No hilar adenopathy. Favor  minimal fluid versus less likely a lymph node adjacent the esophagus on 47/2, new. Lungs/Pleura: Moderate right pleural effusion, increased. Small to moderate left pleural effusion, increased. Minimal motion degradation in the lower chest. Right lower lobe volume loss and interstitial thickening, increased. No well-defined residual right lower lobe mass. Irregular right upper lobe nodule on the order of 1.3 cm on 64/6 is felt to be similar. Pleural-based left upper lobe pulmonary nodule is new at 5 mm on 38/6. More central 3 mm left upper lobe pulmonary nodule on 72/6, new. New left lower lobe pulmonary nodule of 8 mm on 90/6. Musculoskeletal: Osseous metastasis. Index lucent lesion within the T1 vertebral body is eccentric right and similar in size at 8 mm. CT ABDOMEN PELVIS FINDINGS Hepatobiliary: The segment 4A liver lesion measures 9 mm on 46/2 versus 13 mm on the prior. However, there has been development of innumerable small bilateral liver lesions, consistent with diffuse metastasis. Capsular based high right hepatic lobe lesion measures 3.2 x 2.0 cm on 46/2. Cholecystectomy, without biliary ductal dilatation. Pancreas: Normal, without mass or ductal dilatation. Spleen: Normal in size, without focal abnormality. Adrenals/Urinary Tract: Normal adrenal glands. Heterogeneous renal enhancement bilaterally. This is most conspicuous on kidney delayed images. No hydronephrosis. Normal urinary bladder. Stomach/Bowel: Normal stomach, without wall thickening. Colonic stool burden suggests constipation. Normal small bowel. Vascular/Lymphatic: Aortic atherosclerosis. No abdominopelvic adenopathy. Reproductive: Normal prostate. Other: Small volume abdominopelvic fluid, minimally increased. No well-defined omental/peritoneal metastasis. Musculoskeletal: Superior endplate mild compression deformity at L 2 is likely due to an underlying pathologic fracture and is similar. Similar  eccentric right L4 lytic metastasis.  IMPRESSION: CT CHEST IMPRESSION 1. Development of left-sided pulmonary nodules, consistent with metastatic disease. 2. Increased right larger than left pleural effusions. 3.  Aortic Atherosclerosis (ICD10-I70.0). 4. Similar osseous metastasis. CT ABDOMEN AND PELVIS IMPRESSION 1. Progressive hepatic metastasis. 2. Similar osseous metastasis. 3.  Possible constipation. 4. Slight increase in small volume abdominopelvic fluid. Electronically Signed   By: Abigail Miyamoto M.D.   On: 03/18/2019 12:43   [place] Chest (pleural Effusion)  Result Date: 03/22/2019 CLINICAL DATA:  48 year old male with a history of lung cancer and recurrent pleural effusion. He presents for thoracentesis. However, he is significantly hypertensive with a blood pressure in the 40J systolic. EXAM: CHEST ULTRASOUND COMPARISON:  None. FINDINGS: Ultrasound interrogation of the right chest demonstrates a moderately complex pleural effusion with extensive internal septations. IMPRESSION: 1. Loculated right pleural effusion with multiple internal septations. 2. The patient was hypertensive and therefore unable to undergo thoracentesis at this time for fear of complication such as syncope. We will reschedule. Electronically Signed   By: Jacqulynn Cadet M.D.   On: 03/22/2019 16:16   Ct Abdomen Pelvis W Contrast  Result Date: 03/18/2019 CLINICAL DATA:  History of lung cancer diagnosed in 2019. Chemotherapy in progress. Radiation therapy complete. EXAM: CT CHEST, ABDOMEN, AND PELVIS WITH CONTRAST TECHNIQUE: Multidetector CT imaging of the chest, abdomen and pelvis was performed following the standard protocol during bolus administration of intravenous contrast. CONTRAST:  155mL OMNIPAQUE IOHEXOL 300 MG/ML  SOLN COMPARISON:  01/14/2019 FINDINGS: CT CHEST FINDINGS Cardiovascular: Aortic atherosclerosis. Tortuous thoracic aorta. Normal heart size, without pericardial effusion. Median sternotomy for CABG. No central pulmonary embolism, on this non-dedicated  study. Mediastinum/Nodes: No supraclavicular adenopathy. No hilar adenopathy. Favor minimal fluid versus less likely a lymph node adjacent the esophagus on 47/2, new. Lungs/Pleura: Moderate right pleural effusion, increased. Small to moderate left pleural effusion, increased. Minimal motion degradation in the lower chest. Right lower lobe volume loss and interstitial thickening, increased. No well-defined residual right lower lobe mass. Irregular right upper lobe nodule on the order of 1.3 cm on 64/6 is felt to be similar. Pleural-based left upper lobe pulmonary nodule is new at 5 mm on 38/6. More central 3 mm left upper lobe pulmonary nodule on 72/6, new. New left lower lobe pulmonary nodule of 8 mm on 90/6. Musculoskeletal: Osseous metastasis. Index lucent lesion within the T1 vertebral body is eccentric right and similar in size at 8 mm. CT ABDOMEN PELVIS FINDINGS Hepatobiliary: The segment 4A liver lesion measures 9 mm on 46/2 versus 13 mm on the prior. However, there has been development of innumerable small bilateral liver lesions, consistent with diffuse metastasis. Capsular based high right hepatic lobe lesion measures 3.2 x 2.0 cm on 46/2. Cholecystectomy, without biliary ductal dilatation. Pancreas: Normal, without mass or ductal dilatation. Spleen: Normal in size, without focal abnormality. Adrenals/Urinary Tract: Normal adrenal glands. Heterogeneous renal enhancement bilaterally. This is most conspicuous on kidney delayed images. No hydronephrosis. Normal urinary bladder. Stomach/Bowel: Normal stomach, without wall thickening. Colonic stool burden suggests constipation. Normal small bowel. Vascular/Lymphatic: Aortic atherosclerosis. No abdominopelvic adenopathy. Reproductive: Normal prostate. Other: Small volume abdominopelvic fluid, minimally increased. No well-defined omental/peritoneal metastasis. Musculoskeletal: Superior endplate mild compression deformity at L 2 is likely due to an underlying  pathologic fracture and is similar. Similar eccentric right L4 lytic metastasis. IMPRESSION: CT CHEST IMPRESSION 1. Development of left-sided pulmonary nodules, consistent with metastatic disease. 2. Increased right larger than left pleural effusions. 3.  Aortic Atherosclerosis (ICD10-I70.0). 4.  Similar osseous metastasis. CT ABDOMEN AND PELVIS IMPRESSION 1. Progressive hepatic metastasis. 2. Similar osseous metastasis. 3.  Possible constipation. 4. Slight increase in small volume abdominopelvic fluid. Electronically Signed   By: Abigail Miyamoto M.D.   On: 03/18/2019 12:43   Dg Chest Port 1 View  Result Date: 03/27/2019 CLINICAL DATA:  Right-sided PleurX catheter placement EXAM: PORTABLE CHEST 1 VIEW COMPARISON:  03/25/2019 FINDINGS: Interval placement of a right-sided tunneled chest tube, catheter looped about the posterior right lung base. There has been near complete evacuation of a previously seen right-sided pleural effusion, with a thick pleural rind and small hydropneumothorax about the lateral and lower right lung. Small, layering left pleural effusion. Cardiomegaly status post median sternotomy. IMPRESSION: Interval placement of a right-sided tunneled chest tube, catheter looped about the posterior right lung base. There has been near complete evacuation of a previously seen right-sided pleural effusion, with a thick pleural rind and small hydropneumothorax about the lateral and lower right lung. Small, layering left pleural effusion. Cardiomegaly status post median sternotomy. Electronically Signed   By: Eddie Candle M.D.   On: 03/27/2019 12:32   Dg Fluoro Rm 1-60 Min - No Report  Result Date: 03/27/2019 Fluoroscopy was utilized by the requesting physician.  No radiographic interpretation.        This case was discussed with Dr. [name]. He expressed agreement with my management of this patient.

## 2019-04-04 NOTE — Progress Notes (Signed)
Drained 250 cc's of amber colored pleural fluid from right pleurX. No signs of infection and patient tolerated well. He will return on Tuesday 04/09/19 for next drainage.

## 2019-04-09 ENCOUNTER — Other Ambulatory Visit: Payer: Medicaid Other

## 2019-04-09 ENCOUNTER — Ambulatory Visit (INDEPENDENT_AMBULATORY_CARE_PROVIDER_SITE_OTHER): Payer: Medicaid Other

## 2019-04-09 ENCOUNTER — Other Ambulatory Visit: Payer: Self-pay

## 2019-04-09 ENCOUNTER — Ambulatory Visit: Payer: Medicaid Other

## 2019-04-09 ENCOUNTER — Ambulatory Visit: Payer: Medicaid Other | Admitting: Physician Assistant

## 2019-04-09 DIAGNOSIS — J90 Pleural effusion, not elsewhere classified: Secondary | ICD-10-CM

## 2019-04-09 DIAGNOSIS — Z4802 Encounter for removal of sutures: Secondary | ICD-10-CM

## 2019-04-09 NOTE — Progress Notes (Signed)
Drained 300 cc's of amber colored pleural fluid. Patient tolerated well. He will return Friday 04/12/19 for next drainage.

## 2019-04-11 ENCOUNTER — Telehealth: Payer: Self-pay | Admitting: *Deleted

## 2019-04-11 ENCOUNTER — Other Ambulatory Visit: Payer: Self-pay | Admitting: Internal Medicine

## 2019-04-11 MED ORDER — PROCHLORPERAZINE MALEATE 10 MG PO TABS: 10.0000 mg | ORAL_TABLET | Freq: Four times a day (QID) | ORAL | 0 refills | Status: DC | PRN

## 2019-04-11 NOTE — Telephone Encounter (Signed)
I ordered compazine

## 2019-04-11 NOTE — Telephone Encounter (Signed)
Received vm message from Anthony [name], pt's friend and HCPOA. She requested call back. TCT to [place]. Spoke with her. She states they she and Anthony [name] have returned from [place] and a visit with Dr. Durenda Hurt.  Per Anthony [name], Dr. Durenda Hurt does not recommend further treatment at this time. He supports a decision for Hospice if that is what pt desires.  Anthony [name] states that she and Anthony [name] talked about it on the way home from [facility] and decided to go with with Hospice referral-they would like that as soon as possible. Anthony [name] does not want to have any more treatment, but to have good pain control and to be comfortable for whatever time he has left. Advised that I would call the referral in to Anthony [name] as pt lives in [place]. Anthony [name] does want to see Dr. [name] on his next scheduled appt but not have the treatment.  Will cancel [facility] appt. Hospice referral called.  The only concern is pain control. Pt is having difficulty with nausea with oxycodone. Plus it is not helping his pain as well as it used to.  He would like something stronger. He was given 2 doses of IV Dilaudid at Dr. Suzzanne Cloud office with relief.  [name] does not have anything for nausea at home at this time  Will cancel his other appts at your discretion Please advise.

## 2019-04-11 NOTE — Telephone Encounter (Signed)
[place] Work  Clinical Social Work received call from patient and friend, Odetta Pink, with update from their appointment with [facility] oncologist.  Patient received recommendation to pursue Hospice services and patient is in agreement.  Patient's friend requested information on personal care services at home.  CSW/medical oncology can place that order through [facility]- CSW would like to discuss with Hospice team once patient is enrolled to confirm this is the most appropriate service.  CSW encouraged patient/friend to contact Dr. Worthy Flank office to discuss Hospice enrollment.  Patient/family plan to follow up with CSW.   [name], LCSW  Clinical Social Worker [facility]

## 2019-04-12 ENCOUNTER — Telehealth: Payer: Self-pay | Admitting: Medical Oncology

## 2019-04-12 ENCOUNTER — Other Ambulatory Visit: Payer: Medicaid Other | Admitting: Internal Medicine

## 2019-04-12 ENCOUNTER — Other Ambulatory Visit: Payer: Self-pay

## 2019-04-12 DIAGNOSIS — R110 Nausea: Secondary | ICD-10-CM

## 2019-04-12 MED ORDER — PROCHLORPERAZINE MALEATE 10 MG PO TABS: 10.0000 mg | ORAL_TABLET | Freq: Four times a day (QID) | ORAL | 0 refills | Status: AC | PRN

## 2019-04-12 NOTE — Telephone Encounter (Signed)
Pt prefers [facility] in [place] for all meds. Compazine refill sent to [facility].

## 2019-04-12 NOTE — Telephone Encounter (Signed)
Called [facility] and cancelled compazine refill as it was sent to a different pharmacy per pt request.

## 2019-04-16 ENCOUNTER — Other Ambulatory Visit: Payer: Medicaid Other

## 2019-04-16 ENCOUNTER — Inpatient Hospital Stay: Payer: Medicaid Other

## 2019-04-16 ENCOUNTER — Inpatient Hospital Stay (HOSPITAL_BASED_OUTPATIENT_CLINIC_OR_DEPARTMENT_OTHER): Payer: Medicaid Other | Admitting: Internal Medicine

## 2019-04-16 ENCOUNTER — Other Ambulatory Visit: Payer: Self-pay

## 2019-04-16 ENCOUNTER — Encounter: Payer: Self-pay | Admitting: Internal Medicine

## 2019-04-16 VITALS — BP 98/73 | HR 85 | Temp 98.2°F | Resp 17 | Ht 70.0 in | Wt 164.0 lb

## 2019-04-16 DIAGNOSIS — C787 Secondary malignant neoplasm of liver and intrahepatic bile duct: Secondary | ICD-10-CM

## 2019-04-16 DIAGNOSIS — C7951 Secondary malignant neoplasm of bone: Secondary | ICD-10-CM

## 2019-04-16 DIAGNOSIS — Z5111 Encounter for antineoplastic chemotherapy: Secondary | ICD-10-CM

## 2019-04-16 DIAGNOSIS — C3491 Malignant neoplasm of unspecified part of right bronchus or lung: Secondary | ICD-10-CM

## 2019-04-16 LAB — CBC WITH DIFFERENTIAL (CANCER CENTER ONLY)
Abs Immature Granulocytes: 0.02 10*3/uL (ref 0.00–0.07)
Basophils Absolute: 0 10*3/uL (ref 0.0–0.1)
Basophils Relative: 0 %
Eosinophils Absolute: 0 10*3/uL (ref 0.0–0.5)
Eosinophils Relative: 0 %
HCT: 37.5 % — ABNORMAL LOW (ref 39.0–52.0)
Hemoglobin: 11.4 g/dL — ABNORMAL LOW (ref 13.0–17.0)
Immature Granulocytes: 1 %
Lymphocytes Relative: 17 %
Lymphs Abs: 0.7 10*3/uL (ref 0.7–4.0)
MCH: 30.5 pg (ref 26.0–34.0)
MCHC: 30.4 g/dL (ref 30.0–36.0)
MCV: 100.3 fL — ABNORMAL HIGH (ref 80.0–100.0)
Monocytes Absolute: 0.6 10*3/uL (ref 0.1–1.0)
Monocytes Relative: 14 %
Neutro Abs: 2.6 10*3/uL (ref 1.7–7.7)
Neutrophils Relative %: 68 %
Platelet Count: 374 10*3/uL (ref 150–400)
RBC: 3.74 MIL/uL — ABNORMAL LOW (ref 4.22–5.81)
RDW: 21.9 % — ABNORMAL HIGH (ref 11.5–15.5)
WBC Count: 3.8 10*3/uL — ABNORMAL LOW (ref 4.0–10.5)
nRBC: 0 % (ref 0.0–0.2)

## 2019-04-16 LAB — CMP (CANCER CENTER ONLY)
ALT: 16 U/L (ref 0–44)
AST: 22 U/L (ref 15–41)
Albumin: 3.3 g/dL — ABNORMAL LOW (ref 3.5–5.0)
Alkaline Phosphatase: 86 U/L (ref 38–126)
Anion gap: 8 (ref 5–15)
BUN: 22 mg/dL — ABNORMAL HIGH (ref 6–20)
CO2: 28 mmol/L (ref 22–32)
Calcium: 9.6 mg/dL (ref 8.9–10.3)
Chloride: 100 mmol/L (ref 98–111)
Creatinine: 0.75 mg/dL (ref 0.61–1.24)
GFR, Est AFR Am: >60 mL/min (ref >60)
GFR, Estimated: >60 mL/min (ref >60)
Glucose, Bld: 92 mg/dL (ref 70–99)
Potassium: 4.3 mmol/L (ref 3.5–5.1)
Sodium: 136 mmol/L (ref 135–145)
Total Bilirubin: 0.3 mg/dL (ref 0.3–1.2)
Total Protein: 6.3 g/dL — ABNORMAL LOW (ref 6.5–8.1)

## 2019-04-16 NOTE — Progress Notes (Signed)
[name] [phone]   [phone]  OFFICE PROGRESS NOTE  Anthony Cha, MD [address] [place] [address]  DIAGNOSIS: Stage IVB(T1b, N2, M1c)non-small cell lung cancer, adenocarcinoma presented with right upper lobe lung nodule in addition to right hilar and mediastinal lymphadenopathy as well as metastatic liver and bone disease diagnosed in February 2019.  Biomarker Findings Microsatellite status - MS-Stable Tumor Mutational Burden - TMB-Low (4 Muts/Mb) Genomic Findings For a complete list of the genes assayed, please refer to the Appendix. EGFR exon 20 insertion ([id number]) RB1 loss exons 9-17 TP53 [id number] 7 Disease relevant genes with no reportable alterations: KRAS, ALK, BRAF, MET, RET, ERBB2, ROS1  PDL 1 expression 5%  PRIOR THERAPY:  1) Palliative radiotherapy to the metastatic bone lesions in the cervical spine and left femur under the care of Dr. Sondra [name]. 2) Systemic chemotherapy with carboplatin for AUC of 5, Alimta 500 mg/M2 and Keytruda 200 mg IV every 3 weeks.  First dose October 04, 2017.  Status post 3 cycles. 3) palliative stereotactic radiotherapy to the progressive liver lesion. 4)  Maintenance treatment with Alimta 500 mg/M2 and Keytruda 200 mg IV every 3 weeks.  Status post 12 cycles.  Last dose was giving August 14, 2018 discontinued secondary to disease progression. 5) Systemic chemotherapy with docetaxel 75 mg/M2 and Cyramza 10 mg/KG every 3 weeks with Neulasta support.  First dose September 11, 2018.  Status post 9 cycles.  Last cycle was given on 02/26/2019 discontinued secondary to disease progression. 6)  Systemic chemotherapy with gemcitabine 1000 mg/M2 on days 1 and 8 every 3 weeks.  First dose March 22, 2019.  Status post 1 cycle.  Discontinued based on the patient his request.   CURRENT THERAPY: Palliative and hospice care.  INTERVAL HISTORY: Anthony [name] 48 y.o. male returns to  the clinic today for follow-up visit.  The patient is feeling a little bit better compared to few weeks ago.  His pain is well controlled with fentanyl patch and oxycodone.  He is eating a little bit better but lost around 12 pounds since his last visit.  He was seen recently by Dr. Durenda [name] at [facility] for consideration of clinical trial for exon 20 EGFR mutation but he was not eligible for the trial.  After discussion with Dr. Durenda [name], the patient elected to consider palliative care and hospice.  He is now followed by the hospice care of [facility].  He denied having any current chest pain, shortness of breath, cough or hemoptysis.  He has no nausea, vomiting, diarrhea or constipation.  He denied having any headache or visual changes.  He is here today for reevaluation and close up visit.   MEDICAL HISTORY: Past Medical History:  Diagnosis Date   Acid reflux    Bone metastasis ([name])    Chronic kidney disease    Stage III   Depression    History of blood transfusion    History of radiation therapy 09/21/17-10/04/17   C4 spine 30 Gy in 10 fractions, left femur 30 Gy in 10 fractions   Liver metastasis ([facility])    Non-small cell lung cancer ([place])    NSTEMI (non-ST elevated myocardial infarction) ([place]) 06/13/2017   Anthony [name] 06/14/2017   Pleural effusion    Shingles 01/2019   Tailbone injury since 1993   "cracked"    ALLERGIES:  has No Known Allergies.  MEDICATIONS:  Current Outpatient Medications  Medication Sig Dispense Refill  acetaminophen (TYLENOL) 500 MG tablet Take 500-1,000 mg by mouth daily as needed for moderate pain or headache.     aspirin EC 81 MG EC tablet Take 1 tablet (81 mg total) by mouth daily.     atorvastatin (LIPITOR) 40 MG tablet Take 1 tablet (40 mg total) by mouth daily. Please make yearly appt with Dr. Tamala [name] for September for future refills. 1st attempt 90 tablet 0   bisacodyl (DULCOLAX) 10 MG suppository Place 10 mg rectally  daily as needed for moderate constipation.     cetirizine (ZYRTEC) 10 MG tablet Take 10 mg by mouth daily as needed for allergies.      cyclobenzaprine (FLEXERIL) 5 MG tablet Take 1 tablet (5 mg total) by mouth 3 (three) times daily as needed for muscle spasms. 30 tablet 0   dexamethasone (DECADRON) 4 MG tablet Take 4 mg by mouth daily.     doxepin (SINEQUAN) 10 MG/ML solution Mix 2.5 ml in 2.5 ml of water and rinse in mouth for 1 minute twice daily. Spit out solution after rinsing mouth. (Patient taking differently: See admin instructions. Mix 2.5 ml in 2.5 ml of water and rinse in mouth for 1 minute twice daily as needed. Spit out solution after rinsing mouth.) 120 mL 12   Ensure (ENSURE) Take 237 mLs by mouth 4 (four) times daily. "max cafe mocha"     esomeprazole (NEXIUM) 20 MG capsule Take 20 mg by mouth every other day.     fentaNYL (DURAGESIC) 25 MCG/HR Place 1 patch onto the skin every 3 (three) days. 10 patch 0   gabapentin (NEURONTIN) 100 MG capsule Take 1 capsule (100 mg total) by mouth 3 (three) times daily. 90 capsule 2   gabapentin (NEURONTIN) 300 MG capsule Take 1 capsule by mouth at bedtime.     lidocaine (XYLOCAINE) 2 % solution Use as directed 5 mLs in the mouth or throat as needed for mouth pain. Rinse and spit 200 mL 3   lisinopril (ZESTRIL) 5 MG tablet TAKE 1 TABLET BY MOUTH EVERY DAY 30 tablet 2   metoprolol tartrate (LOPRESSOR) 25 MG tablet TAKE 1/2 TABLET BY MOUTH 2 TIMES DAILY. (Patient taking differently: Take 12.5 mg by mouth 2 (two) times daily. ) 90 tablet 0   Nutritional Supplements (ENSURE PO) Take by mouth.     Oxycodone HCl 10 MG TABS Take 10 mg by mouth every 2 (two) hours as needed. Take 1-2 tabs     Pediatric Multivitamins-Iron (FLINTSTONES COMPLETE PO) Take 1 tablet by mouth daily.     prochlorperazine (COMPAZINE) 10 MG tablet Take 1 tablet (10 mg total) by mouth every 6 (six) hours as needed for nausea or vomiting. 30 tablet 0   No current  facility-administered medications for this visit.    Facility-Administered Medications Ordered in Other Visits  Medication Dose Route Frequency Provider Last Rate Last Dose   sodium chloride flush (NS) 0.9 % injection 10 mL  10 mL Intravenous PRN [name], MD        SURGICAL HISTORY:  Past Surgical History:  Procedure Laterality Date   CARDIAC CATHETERIZATION  06/14/2017   CHEST TUBE INSERTION Right 03/27/2019   Procedure: INSERTION PLEURAL DRAINAGE CATHETER;  Surgeon: Grace Isaac, MD;  Location: [facility] OR;  Service: Thoracic;  Laterality: Right;   CHOLECYSTECTOMY N/A 12/29/2017   Procedure: LAPAROSCOPIC CHOLECYSTECTOMY;  Surgeon: Ralene Ok, MD;  Location: [facility] ORS;  Service: General;  Laterality: N/A;   CORONARY ARTERY BYPASS GRAFT N/A 06/19/2017  Procedure: CORONARY ARTERY BYPASS GRAFTING (CABG), OFF PUMP, times one using the left internal mammary artery to LAD;  Surgeon: Grace Isaac, MD;  Location: [facility];  Service: Open Heart Surgery;  Laterality: N/A;   ESOPHAGOGASTRODUODENOSCOPY (EGD) WITH PROPOFOL N/A 11/30/2017   Procedure: ESOPHAGOGASTRODUODENOSCOPY (EGD) WITH PROPOFOL;  Surgeon: Gatha Mayer, MD;  Location: [facility] ENDOSCOPY;  Service: Endoscopy;  Laterality: N/A;   LEFT HEART CATH AND CORONARY ANGIOGRAPHY N/A 06/14/2017   Procedure: LEFT HEART CATH AND CORONARY ANGIOGRAPHY;  Surgeon: Sherren Mocha, MD;  Location: [facility] CV LAB;  Service: Cardiovascular;  Laterality: N/A;   TEE WITHOUT CARDIOVERSION N/A 06/19/2017   Procedure: TRANSESOPHAGEAL ECHOCARDIOGRAM (TEE);  Surgeon: Grace Isaac, MD;  Location: [facility];  Service: Open Heart Surgery;  Laterality: N/A;   TONSILLECTOMY  ~ 1977    REVIEW OF SYSTEMS:  Constitutional: positive for fatigue and weight loss Eyes: negative Ears, nose, mouth, throat, and face: negative Respiratory: negative Cardiovascular: negative Gastrointestinal: negative Genitourinary:negative Integument/breast:  negative Hematologic/lymphatic: negative Musculoskeletal:negative Neurological: negative Behavioral/Psych: negative Endocrine: negative Allergic/Immunologic: negative   PHYSICAL EXAMINATION: General appearance: alert, cooperative, fatigued and no distress Head: Normocephalic, without obvious abnormality, atraumatic Neck: no adenopathy, no JVD, supple, symmetrical, trachea midline and thyroid not enlarged, symmetric, no tenderness/mass/nodules Lymph nodes: Cervical, supraclavicular, and axillary nodes normal. Resp: diminished breath sounds LLL and RLL and dullness to percussion LLL and RLL Back: symmetric, no curvature. ROM normal. No CVA tenderness. Cardio: regular rate and rhythm, S1, S2 normal, no murmur, click, rub or gallop GI: soft, non-tender; bowel sounds normal; no masses,  no organomegaly Extremities: extremities normal, atraumatic, no cyanosis or edema Neurologic: Alert and oriented X 3, normal strength and tone. Normal symmetric reflexes. Normal coordination and gait  ECOG PERFORMANCE STATUS: 1 - Symptomatic but completely ambulatory  Blood pressure 98/73, pulse 85, temperature 98.2 F (36.8 C), resp. rate 17, height '5\' 10"'$  (1.778 m), weight 164 lb (74.4 kg), SpO2 91 %.  LABORATORY DATA: Lab Results  Component Value Date   WBC 3.8 (L) 04/16/2019   HGB 11.4 (L) 04/16/2019   HCT 37.5 (L) 04/16/2019   MCV 100.3 (H) 04/16/2019   PLT 374 04/16/2019      Chemistry      Component Value Date/Time   NA 135 04/02/2019 1331   NA 141 07/17/2017 1006   K 4.1 04/02/2019 1331   CL 98 04/02/2019 1331   CO2 25 04/02/2019 1331   BUN 32 (H) 04/02/2019 1331   BUN 12 07/17/2017 1006   CREATININE 0.71 04/02/2019 1331      Component Value Date/Time   CALCIUM 9.8 04/02/2019 1331   ALKPHOS 93 04/02/2019 1331   AST 40 04/02/2019 1331   ALT 27 04/02/2019 1331   BILITOT 0.4 04/02/2019 1331        RADIOGRAPHIC STUDIES: Dg Chest 2 View  Result Date: 03/25/2019 CLINICAL DATA:   History of lung cancer. Recurrent effusions. Shortness of breath. EXAM: CHEST - 2 VIEW COMPARISON:  CT 03/18/2019.  Chest x-ray 09/14/2018. FINDINGS: Prior median sternotomy. Heart size stable. Previous identified pulmonary nodule in the left lung best identified by prior chest CT. Large right-sided pleural effusion again noted. Right pleural effusion has increased slightly from prior chest x-ray of 09/14/2018. No pneumothorax. IMPRESSION: Large right pleural effusion again noted. Pleural effusion has increased slightly from prior study of 09/14/2018. Electronically Signed   By: Marcello Moores  Register   On: 03/25/2019 12:49   Ct Chest W Contrast  Result Date: 03/18/2019 CLINICAL  DATA:  History of lung cancer diagnosed in 2019. Chemotherapy in progress. Radiation therapy complete. EXAM: CT CHEST, ABDOMEN, AND PELVIS WITH CONTRAST TECHNIQUE: Multidetector CT imaging of the chest, abdomen and pelvis was performed following the standard protocol during bolus administration of intravenous contrast. CONTRAST:  165m OMNIPAQUE IOHEXOL 300 MG/ML  SOLN COMPARISON:  01/14/2019 FINDINGS: CT CHEST FINDINGS Cardiovascular: Aortic atherosclerosis. Tortuous thoracic aorta. Normal heart size, without pericardial effusion. Median sternotomy for CABG. No central pulmonary embolism, on this non-dedicated study. Mediastinum/Nodes: No supraclavicular adenopathy. No hilar adenopathy. Favor minimal fluid versus less likely a lymph node adjacent the esophagus on 47/2, new. Lungs/Pleura: Moderate right pleural effusion, increased. Small to moderate left pleural effusion, increased. Minimal motion degradation in the lower chest. Right lower lobe volume loss and interstitial thickening, increased. No well-defined residual right lower lobe mass. Irregular right upper lobe nodule on the order of 1.3 cm on 64/6 is felt to be similar. Pleural-based left upper lobe pulmonary nodule is new at 5 mm on 38/6. More central 3 mm left upper lobe pulmonary  nodule on 72/6, new. New left lower lobe pulmonary nodule of 8 mm on 90/6. Musculoskeletal: Osseous metastasis. Index lucent lesion within the T1 vertebral body is eccentric right and similar in size at 8 mm. CT ABDOMEN PELVIS FINDINGS Hepatobiliary: The segment 4A liver lesion measures 9 mm on 46/2 versus 13 mm on the prior. However, there has been development of innumerable small bilateral liver lesions, consistent with diffuse metastasis. Capsular based high right hepatic lobe lesion measures 3.2 x 2.0 cm on 46/2. Cholecystectomy, without biliary ductal dilatation. Pancreas: Normal, without mass or ductal dilatation. Spleen: Normal in size, without focal abnormality. Adrenals/Urinary Tract: Normal adrenal glands. Heterogeneous renal enhancement bilaterally. This is most conspicuous on kidney delayed images. No hydronephrosis. Normal urinary bladder. Stomach/Bowel: Normal stomach, without wall thickening. Colonic stool burden suggests constipation. Normal small bowel. Vascular/Lymphatic: Aortic atherosclerosis. No abdominopelvic adenopathy. Reproductive: Normal prostate. Other: Small volume abdominopelvic fluid, minimally increased. No well-defined omental/peritoneal metastasis. Musculoskeletal: Superior endplate mild compression deformity at L 2 is likely due to an underlying pathologic fracture and is similar. Similar eccentric right L4 lytic metastasis. IMPRESSION: CT CHEST IMPRESSION 1. Development of left-sided pulmonary nodules, consistent with metastatic disease. 2. Increased right larger than left pleural effusions. 3.  Aortic Atherosclerosis (ICD10-I70.0). 4. Similar osseous metastasis. CT ABDOMEN AND PELVIS IMPRESSION 1. Progressive hepatic metastasis. 2. Similar osseous metastasis. 3.  Possible constipation. 4. Slight increase in small volume abdominopelvic fluid. Electronically Signed   By: KAbigail MiyamotoM.D.   On: 03/18/2019 12:43   [place]hest (pleural Effusion)  Result Date: 03/22/2019 CLINICAL  DATA:  48year old male with a history of lung cancer and recurrent pleural effusion. He presents for thoracentesis. However, he is significantly hypertensive with a blood pressure in the 898Psystolic. EXAM: CHEST ULTRASOUND COMPARISON:  None. FINDINGS: Ultrasound interrogation of the right chest demonstrates a moderately complex pleural effusion with extensive internal septations. IMPRESSION: 1. Loculated right pleural effusion with multiple internal septations. 2. The patient was hypertensive and therefore unable to undergo thoracentesis at this time for fear of complication such as syncope. We will reschedule. Electronically Signed   By: HJacqulynn CadetM.D.   On: 03/22/2019 16:16   Ct Abdomen Pelvis W Contrast  Result Date: 03/18/2019 CLINICAL DATA:  History of lung cancer diagnosed in 2019. Chemotherapy in progress. Radiation therapy complete. EXAM: CT CHEST, ABDOMEN, AND PELVIS WITH CONTRAST TECHNIQUE: Multidetector CT imaging of the chest, abdomen and pelvis was  performed following the standard protocol during bolus administration of intravenous contrast. CONTRAST:  184m OMNIPAQUE IOHEXOL 300 MG/ML  SOLN COMPARISON:  01/14/2019 FINDINGS: CT CHEST FINDINGS Cardiovascular: Aortic atherosclerosis. Tortuous thoracic aorta. Normal heart size, without pericardial effusion. Median sternotomy for CABG. No central pulmonary embolism, on this non-dedicated study. Mediastinum/Nodes: No supraclavicular adenopathy. No hilar adenopathy. Favor minimal fluid versus less likely a lymph node adjacent the esophagus on 47/2, new. Lungs/Pleura: Moderate right pleural effusion, increased. Small to moderate left pleural effusion, increased. Minimal motion degradation in the lower chest. Right lower lobe volume loss and interstitial thickening, increased. No well-defined residual right lower lobe mass. Irregular right upper lobe nodule on the order of 1.3 cm on 64/6 is felt to be similar. Pleural-based left upper lobe  pulmonary nodule is new at 5 mm on 38/6. More central 3 mm left upper lobe pulmonary nodule on 72/6, new. New left lower lobe pulmonary nodule of 8 mm on 90/6. Musculoskeletal: Osseous metastasis. Index lucent lesion within the T1 vertebral body is eccentric right and similar in size at 8 mm. CT ABDOMEN PELVIS FINDINGS Hepatobiliary: The segment 4A liver lesion measures 9 mm on 46/2 versus 13 mm on the prior. However, there has been development of innumerable small bilateral liver lesions, consistent with diffuse metastasis. Capsular based high right hepatic lobe lesion measures 3.2 x 2.0 cm on 46/2. Cholecystectomy, without biliary ductal dilatation. Pancreas: Normal, without mass or ductal dilatation. Spleen: Normal in size, without focal abnormality. Adrenals/Urinary Tract: Normal adrenal glands. Heterogeneous renal enhancement bilaterally. This is most conspicuous on kidney delayed images. No hydronephrosis. Normal urinary bladder. Stomach/Bowel: Normal stomach, without wall thickening. Colonic stool burden suggests constipation. Normal small bowel. Vascular/Lymphatic: Aortic atherosclerosis. No abdominopelvic adenopathy. Reproductive: Normal prostate. Other: Small volume abdominopelvic fluid, minimally increased. No well-defined omental/peritoneal metastasis. Musculoskeletal: Superior endplate mild compression deformity at L 2 is likely due to an underlying pathologic fracture and is similar. Similar eccentric right L4 lytic metastasis. IMPRESSION: CT CHEST IMPRESSION 1. Development of left-sided pulmonary nodules, consistent with metastatic disease. 2. Increased right larger than left pleural effusions. 3.  Aortic Atherosclerosis (ICD10-I70.0). 4. Similar osseous metastasis. CT ABDOMEN AND PELVIS IMPRESSION 1. Progressive hepatic metastasis. 2. Similar osseous metastasis. 3.  Possible constipation. 4. Slight increase in small volume abdominopelvic fluid. Electronically Signed   By: KAbigail MiyamotoM.D.   On:  03/18/2019 12:43   Dg Chest Port 1 View  Result Date: 03/27/2019 CLINICAL DATA:  Right-sided PleurX catheter placement EXAM: PORTABLE CHEST 1 VIEW COMPARISON:  03/25/2019 FINDINGS: Interval placement of a right-sided tunneled chest tube, catheter looped about the posterior right lung base. There has been near complete evacuation of a previously seen right-sided pleural effusion, with a thick pleural rind and small hydropneumothorax about the lateral and lower right lung. Small, layering left pleural effusion. Cardiomegaly status post median sternotomy. IMPRESSION: Interval placement of a right-sided tunneled chest tube, catheter looped about the posterior right lung base. There has been near complete evacuation of a previously seen right-sided pleural effusion, with a thick pleural rind and small hydropneumothorax about the lateral and lower right lung. Small, layering left pleural effusion. Cardiomegaly status post median sternotomy. Electronically Signed   By: AEddie CandleM.D.   On: 03/27/2019 12:32   Dg Fluoro Rm 1-60 Min - No Report  Result Date: 03/27/2019 Fluoroscopy was utilized by the requesting physician.  No radiographic interpretation.    ASSESSMENT AND PLAN: This is a very pleasant 48years old white male with stage  IV non-small cell lung cancer, adenocarcinoma with resistant EGFR mutation in exon 20 and PDL 1 expression of 5%. The patient completed a course of palliative radiotherapy to the cervical spine as well as left hip under the care of Dr. Sondra [name].Marland Kitchen He completed induction systemic chemotherapy with carboplatin, Alimta and Keytruda status post 3 cycles.  He has no evidence for disease progression after the induction phase of his treatment. The patient was started on maintenance treatment with Alimta and Ketruda (pembrolizumab) status post 12 cycles.  He has been tolerating this treatment well with no concerning complaints.  He had repeat CT scan of the chest, abdomen and pelvis  performed recently.  I personally and independently reviewed the scan images and discussed the results with the patient and his friend today. Unfortunately his scan showed significant evidence for disease progression with enlargement of right lung mass as well as pulmonary nodules in addition to mediastinal lymphadenopathy as well as liver and bone disease. The patient was started on systemic chemotherapy with docetaxel and Cyramza status post 9 cycles.  He has been tolerating this treatment well except for the fatigue and weakness. Patient had repeat CT scan of the chest, abdomen pelvis performed recently.  I personally and independently reviewed the scan images and discussed the results with the patient today. Unfortunately his a scan showed evidence for disease progression with new pulmonary nodules in addition to enlarging pleural effusion and progressive hepatic metastasis. I recommended for the patient to discontinue his current treatment with docetaxel and Cyramza at this point. He was treated with 1 cycle of systemic chemotherapy with single agent gemcitabine.  The patient was seen at [facility] for consideration of clinical trial for EGFR exon 20 mutation but he was not eligible for the trial.  He elected to consider palliative care and hospice.  He is now followed by the hospice care of [facility]. I agreed with this recommendation and recommended for the patient to continue on the hospice care for now. For the enlarging pleural effusion, I will arrange for the patient to have ultrasound-guided right thoracentesis. For pain management he will continue his current treatment with fentanyl patch and oxycodone. For the neuropathic pain from the recent shingle, I will start the patient on gabapentin 100 mg p.o. 3 times daily. I will see the patient on as-needed basis at this point. He was advised to call immediately if he has any concerning symptoms in the interval. The patient  voices understanding of current disease status and treatment options and is in agreement with the current care plan. All questions were answered. The patient knows to call the clinic with any problems, questions or concerns. We can certainly see the patient much sooner if necessary.  Disclaimer: This note was dictated with voice recognition software. Similar sounding words can inadvertently be transcribed and may not be corrected upon review.

## 2019-04-18 ENCOUNTER — Ambulatory Visit: Payer: Medicaid Other | Admitting: Cardiothoracic Surgery

## 2019-04-19 ENCOUNTER — Telehealth: Payer: Self-pay | Admitting: *Deleted

## 2019-04-19 NOTE — Telephone Encounter (Signed)
Anthony Anthony [name]  Clinical Social Anthony [name] contacted patient by phone for follow up counseling session.  Patient reported decreased physical symptoms and increased energy.  Anthony Anthony [name] shared he'd enrolled with Hospice and has been very pleased with their level of support- including visiting him in the phone, managing medications, adjusting pain medications, draining his pleurex, and being available for 24/7 support.  Patient reported feeling positive and felt like he was "living now".    Patient shared in detail recent meaningful conversations with family and friends.  Reviewed and drafted goals to Anthony [name] on including practical/financial goals as well are life review goals.  CSW will follow up with patient as needed.  CSW also contacted patient's HCPOA/friend, Anthony Anthony [name], at patient request.  Anthony Anthony [name] shared her positive experience with Hospice thus far and discussed ways she plans to support Anthony Anthony [name] on his goals.    Anthony Maine, LCSW  Clinical Social Worker [facility]

## 2019-04-23 ENCOUNTER — Inpatient Hospital Stay: Payer: Medicaid Other

## 2019-04-23 ENCOUNTER — Other Ambulatory Visit: Payer: Medicaid Other

## 2019-04-30 ENCOUNTER — Telehealth: Payer: Self-pay | Admitting: *Deleted

## 2019-04-30 ENCOUNTER — Ambulatory Visit: Payer: Medicaid Other

## 2019-04-30 ENCOUNTER — Ambulatory Visit: Payer: Medicaid Other | Admitting: Internal Medicine

## 2019-04-30 ENCOUNTER — Other Ambulatory Visit: Payer: Medicaid Other

## 2019-04-30 NOTE — Telephone Encounter (Signed)
Received vm message from patient requesting a call back. TCT patient. Anthony [name] states that he will be dropping off paperwork related to forgiveness of his student loans, that require medical information and Dr. Worthy Flank signature. Advised [name] to drop it off at the front registration desk, addressed to Dr. [name] and Abelina Bachelor, RN.Marland Kitchen We will let him know when it is ready to be picked up.

## 2019-05-06 ENCOUNTER — Telehealth: Payer: Self-pay | Admitting: Medical Oncology

## 2019-05-06 NOTE — Telephone Encounter (Signed)
Received loan forgiveness application . Needs to be completed and signed by Dr [name].

## 2019-05-07 ENCOUNTER — Ambulatory Visit: Payer: Medicaid Other

## 2019-05-07 ENCOUNTER — Other Ambulatory Visit: Payer: Medicaid Other

## 2019-05-07 ENCOUNTER — Ambulatory Visit: Payer: Medicaid Other | Admitting: Internal Medicine

## 2019-05-14 ENCOUNTER — Ambulatory Visit: Payer: Medicaid Other

## 2019-05-14 ENCOUNTER — Other Ambulatory Visit: Payer: Medicaid Other

## 2019-05-17 ENCOUNTER — Other Ambulatory Visit: Payer: Self-pay | Admitting: Internal Medicine

## 2019-05-17 DIAGNOSIS — C3491 Malignant neoplasm of unspecified part of right bronchus or lung: Secondary | ICD-10-CM

## 2019-05-17 DIAGNOSIS — Z515 Encounter for palliative care: Secondary | ICD-10-CM

## 2019-06-28 ENCOUNTER — Encounter: Payer: Self-pay | Admitting: Medical Oncology

## 2019-06-28 NOTE — Progress Notes (Signed)
Pt died 2019/07/15.

## 2019-07-02 DEATH — deceased

## 2019-11-10 NOTE — Progress Notes (Signed)
NOTE:  Phone call to patient for followup.  He states that he has ended all therapies for cancer, is feeling better and is now a patient of [facility]. Support given and he will be discharged from [facility] care.   Gonzella Lex, NP-C

## 2020-01-13 ENCOUNTER — Telehealth: Payer: Self-pay | Admitting: Internal Medicine

## 2020-01-13 NOTE — Telephone Encounter (Signed)
Release [id number].P:[id number] Faxed medical records to [facility] @ [phone]

## 2020-09-06 IMAGING — MR MR LUMBAR SPINE WO/W CM
4 of 7 series · 19 of 48 positions shown · IV contrast (Yes)
Comparison: CT abdomen pelvis 07/02/2018

CLINICAL DATA: Metastatic non-small cell lung cancer with
progressive constipation and urinary hesitancy. Back pain

EXAM:
MRI LUMBAR SPINE WITHOUT AND WITH CONTRAST
TECHNIQUE: Multiplanar and multiecho pulse sequences of the lumbar spine were
obtained without and with intravenous contrast.
CONTRAST:  10 mL Gadovist IV

[Series 3: T1 · sagittal · 4.0mm · 0.51mm/px · 3 of 13 slices shown (1 of 2)]
[im 1/13]
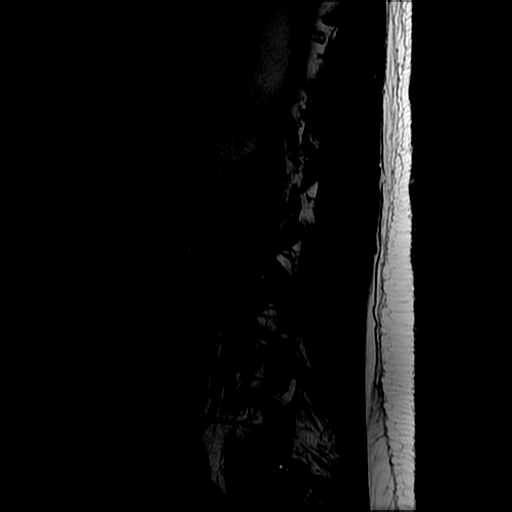
[im 7/13]
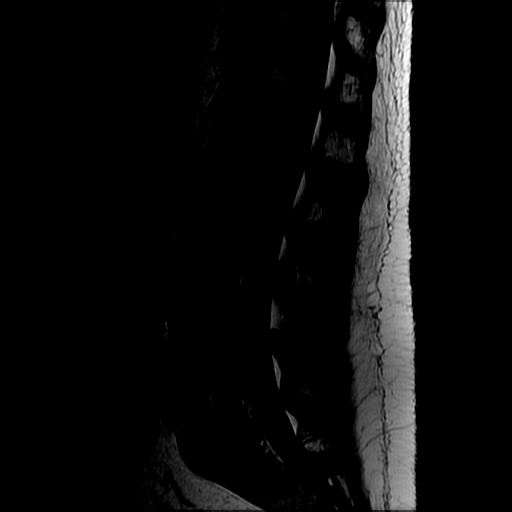
[im 13/13]
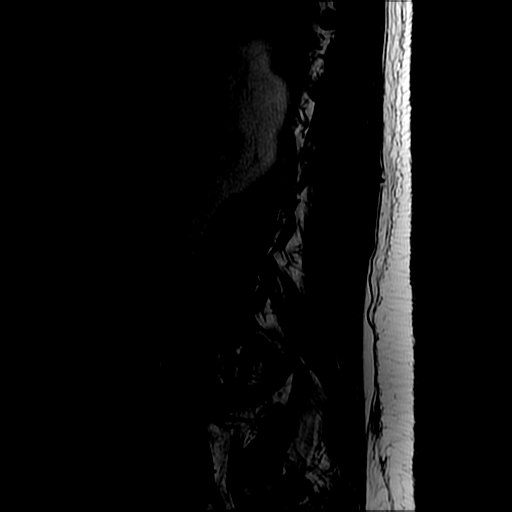

[Series 5: T2 · axial · 4.0mm · 0.39mm/px · z∈[-44,+158]mm · 10 of 41 slices shown]
[im 1/41]
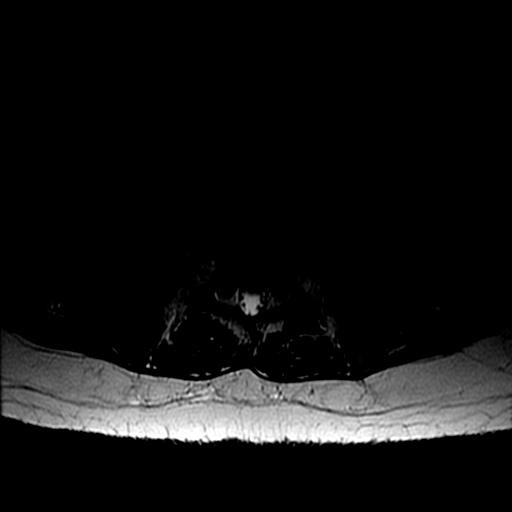
[im 5/41]
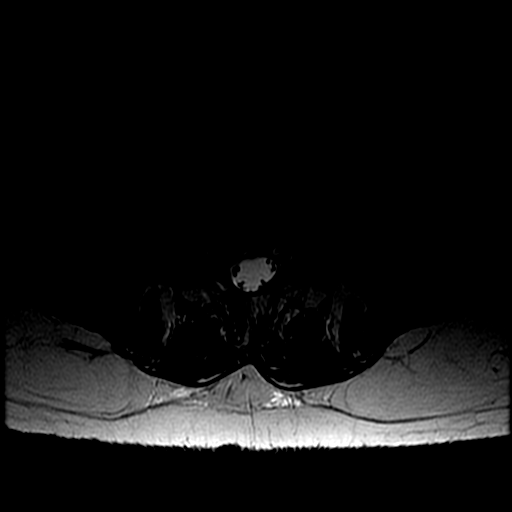
[im 9/41]
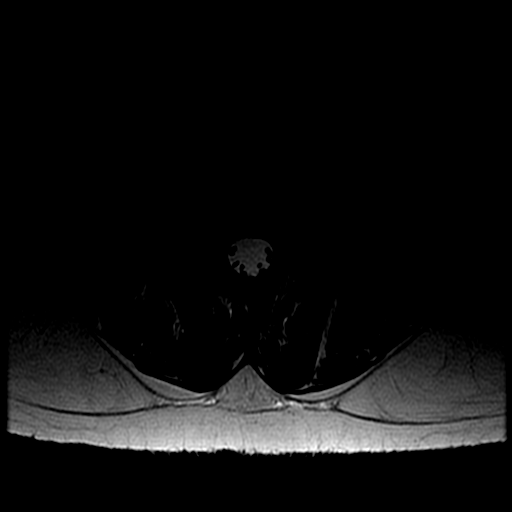
[im 13/41]
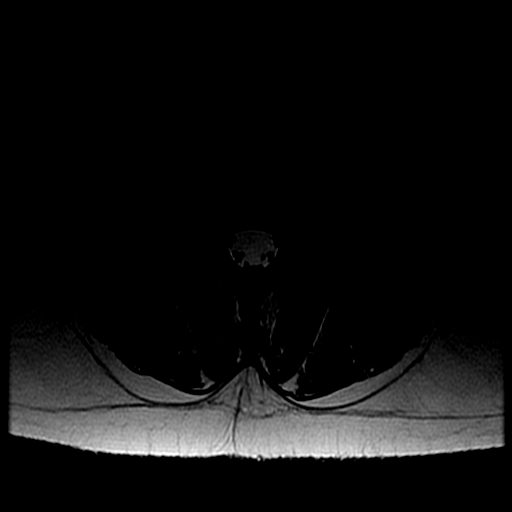
[im 17/41]
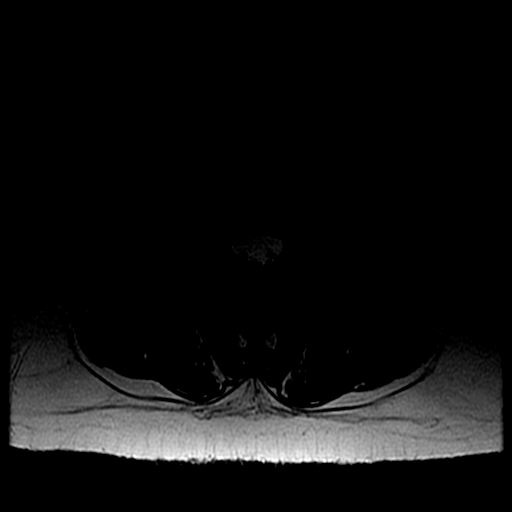
[im 21/41]
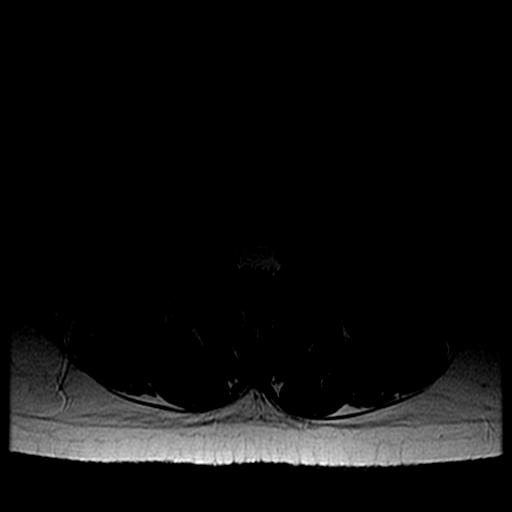
[im 25/41]
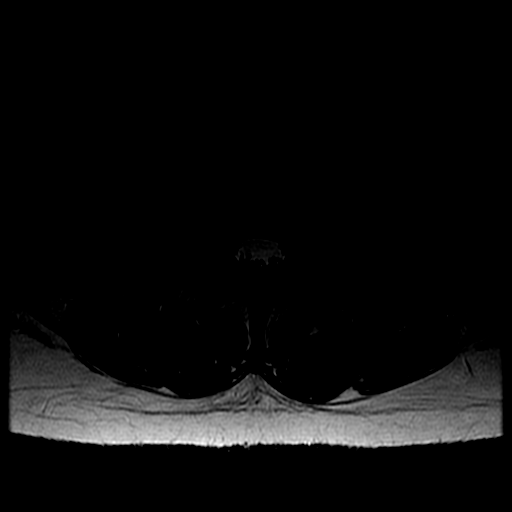
[im 29/41]
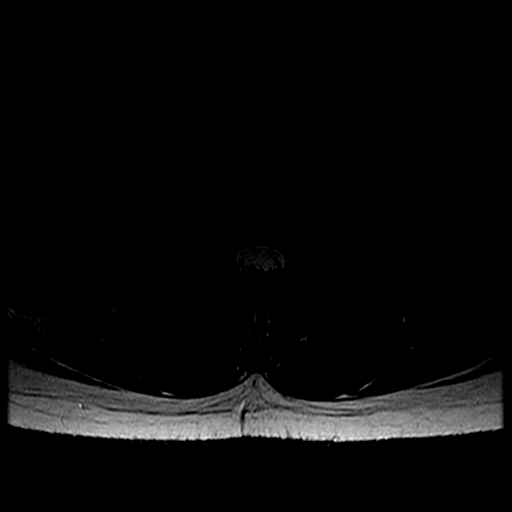
[im 33/41]
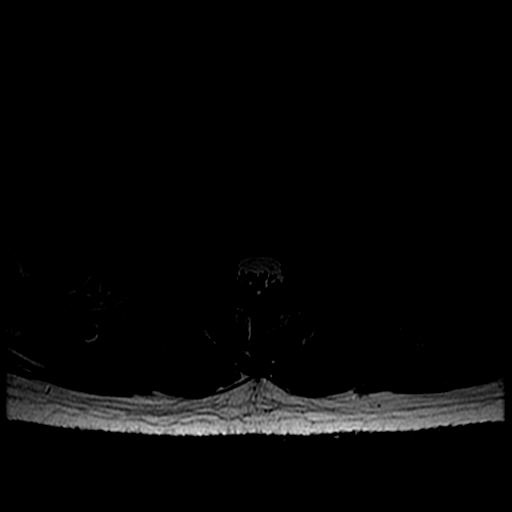
[im 37/41]
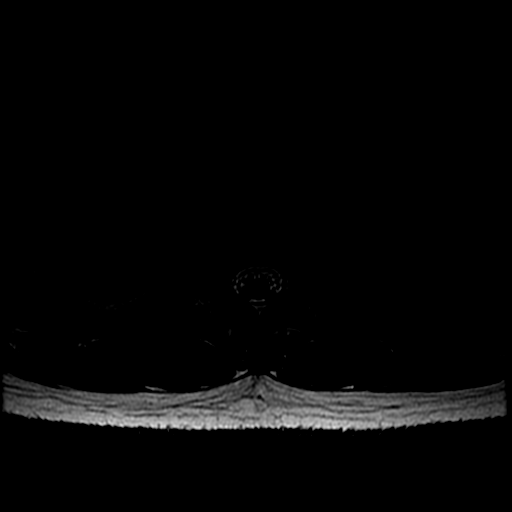

[Series 6: T1 · axial · 4.0mm · 0.39mm/px · z∈[-24,+158]mm · 3 of 41 slices shown (2 of 2)]
[im 5/41]
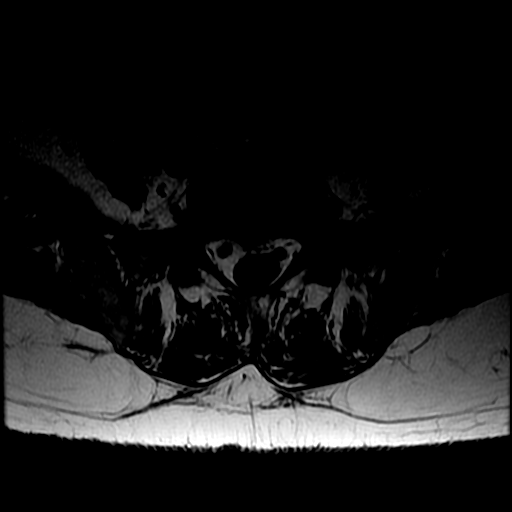
[im 21/41]
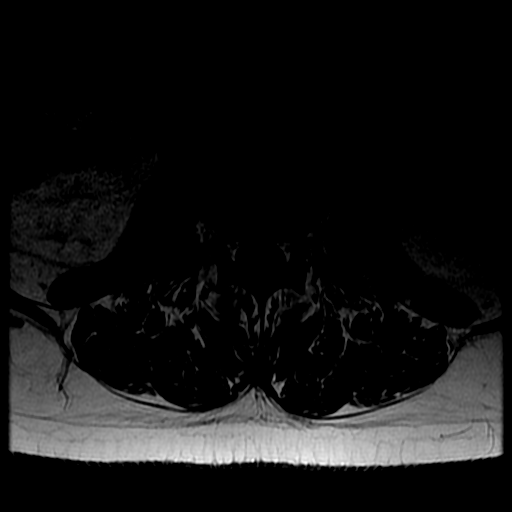
[im 37/41]
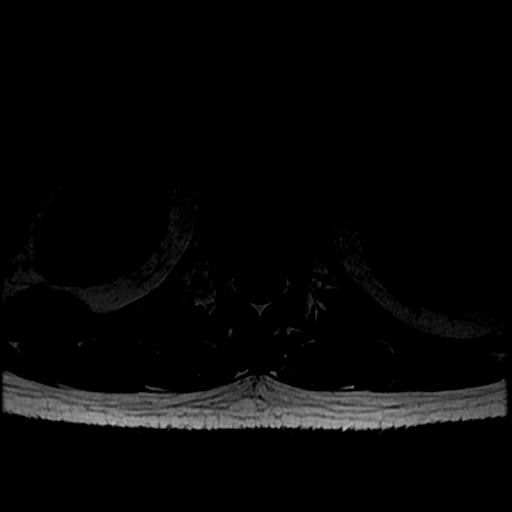

[Series 7: T2 post-contrast · sagittal · 4.0mm · 0.51mm/px · 3 of 13 slices shown]
[im 1/13]
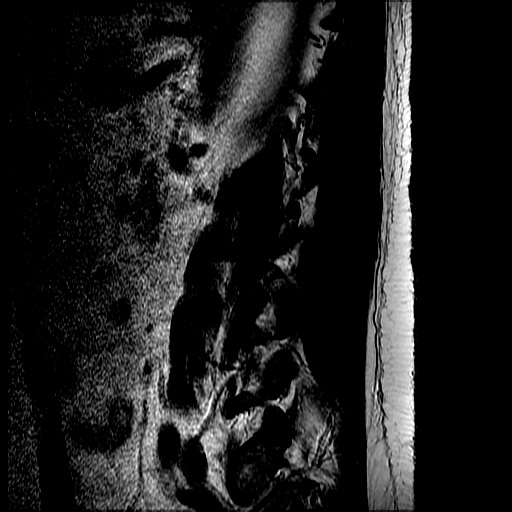
[im 9/13]
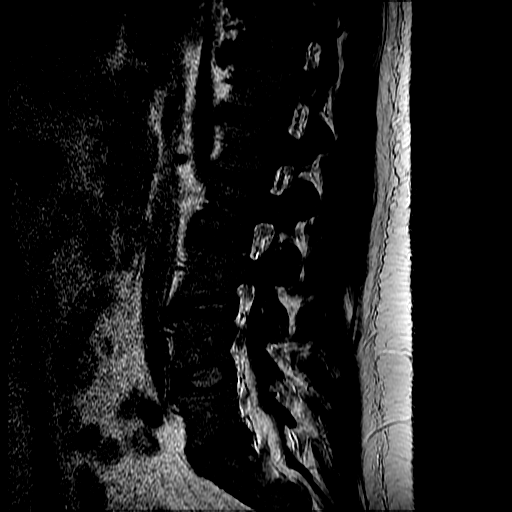
[im 13/13]
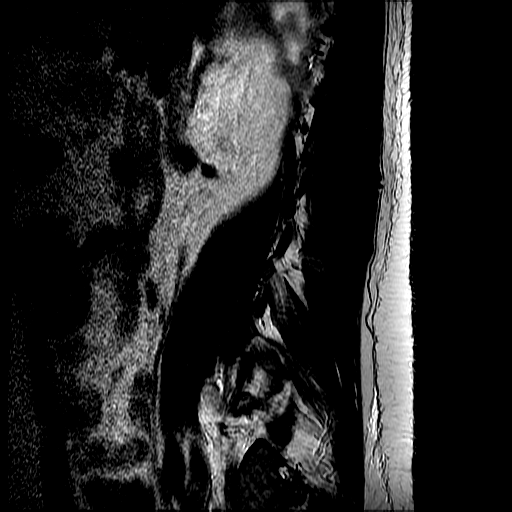

[19 of 48 positions shown; findings below may reference images not displayed]

FINDINGS: Segmentation:  Normal

Alignment:  Normal

Vertebrae: Multiple enhancing metastatic deposits throughout the
lumbar spine. Metastatic disease also in the sacrum and T11 and T12
vertebral body. These are not definitely seen on the prior CT and
likely have progressed in the interval. Negative for fracture.

Conus medullaris and cauda equina: Conus extends to the L1-2 level.
Conus and cauda equina appear normal.

Paraspinal and other soft tissues: Expansile mass in the region of
the right twelfth rib compatible with metastatic disease. This is
not seen on the prior CT.

Paraspinous soft tissue mass to the right of L5 measures 10 x 20 mm
compatible with metastatic disease extension from the L5 vertebral
body. This was not present on the prior CT.

Disc levels:

Small central disc protrusion at L5-S1. No significant spinal
stenosis in the lumbar spine.
IMPRESSION: Multiple metastatic deposits throughout the lower thoracic and
lumbar spine with progression from the CT of 07/02/2018. No fracture
or epidural tumor.

Paraspinous soft tissue mass due to tumor extension to the right of
L5. Metastatic expansile lesion to the right twelfth rib not seen on
the prior study.

## 2023-06-22 NOTE — Telephone Encounter (Signed)
Telephone call
